# Patient Record
Sex: Female | Born: 1955 | Race: White | Hispanic: No | Marital: Married | Smoking: Former smoker
Health system: Southern US, Community
[De-identification: ages and names within clinical notes are randomized; demographics above are authoritative.]

## PROBLEM LIST (undated history)

## (undated) DIAGNOSIS — R1310 Dysphagia, unspecified: Secondary | ICD-10-CM

## (undated) DIAGNOSIS — E119 Type 2 diabetes mellitus without complications: Secondary | ICD-10-CM

## (undated) DIAGNOSIS — K219 Gastro-esophageal reflux disease without esophagitis: Secondary | ICD-10-CM

## (undated) DIAGNOSIS — I7090 Unspecified atherosclerosis: Secondary | ICD-10-CM

## (undated) DIAGNOSIS — R600 Localized edema: Secondary | ICD-10-CM

## (undated) DIAGNOSIS — E669 Obesity, unspecified: Secondary | ICD-10-CM

## (undated) DIAGNOSIS — I10 Essential (primary) hypertension: Secondary | ICD-10-CM

## (undated) DIAGNOSIS — R7303 Prediabetes: Secondary | ICD-10-CM

## (undated) DIAGNOSIS — J45909 Unspecified asthma, uncomplicated: Secondary | ICD-10-CM

## (undated) DIAGNOSIS — F419 Anxiety disorder, unspecified: Secondary | ICD-10-CM

## (undated) DIAGNOSIS — N289 Disorder of kidney and ureter, unspecified: Secondary | ICD-10-CM

## (undated) DIAGNOSIS — M549 Dorsalgia, unspecified: Secondary | ICD-10-CM

## (undated) DIAGNOSIS — J449 Chronic obstructive pulmonary disease, unspecified: Secondary | ICD-10-CM

## (undated) DIAGNOSIS — R079 Chest pain, unspecified: Secondary | ICD-10-CM

## (undated) DIAGNOSIS — K5900 Constipation, unspecified: Secondary | ICD-10-CM

## (undated) DIAGNOSIS — G4730 Sleep apnea, unspecified: Secondary | ICD-10-CM

## (undated) DIAGNOSIS — M2550 Pain in unspecified joint: Secondary | ICD-10-CM

## (undated) DIAGNOSIS — R0602 Shortness of breath: Secondary | ICD-10-CM

## (undated) DIAGNOSIS — N189 Chronic kidney disease, unspecified: Secondary | ICD-10-CM

## (undated) DIAGNOSIS — E785 Hyperlipidemia, unspecified: Secondary | ICD-10-CM

## (undated) DIAGNOSIS — I739 Peripheral vascular disease, unspecified: Secondary | ICD-10-CM

## (undated) HISTORY — DX: Chest pain, unspecified: R07.9

## (undated) HISTORY — DX: Dorsalgia, unspecified: M54.9

## (undated) HISTORY — DX: Disorder of kidney and ureter, unspecified: N28.9

## (undated) HISTORY — DX: Pain in unspecified joint: M25.50

## (undated) HISTORY — DX: Unspecified atherosclerosis: I70.90

## (undated) HISTORY — DX: Shortness of breath: R06.02

## (undated) HISTORY — PX: APPENDECTOMY: SHX54

## (undated) HISTORY — DX: Gastro-esophageal reflux disease without esophagitis: K21.9

## (undated) HISTORY — DX: Hyperlipidemia, unspecified: E78.5

## (undated) HISTORY — DX: Constipation, unspecified: K59.00

## (undated) HISTORY — DX: Obesity, unspecified: E66.9

## (undated) HISTORY — DX: Dysphagia, unspecified: R13.10

## (undated) HISTORY — DX: Localized edema: R60.0

---
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
## 2004-12-26 ENCOUNTER — Emergency Department (HOSPITAL_COMMUNITY): Admission: EM | Admit: 2004-12-26 | Discharge: 2004-12-26 | Payer: Self-pay | Admitting: Emergency Medicine

## 2008-03-29 ENCOUNTER — Other Ambulatory Visit: Payer: Self-pay

## 2008-03-29 ENCOUNTER — Emergency Department: Payer: Self-pay | Admitting: Emergency Medicine

## 2015-12-10 ENCOUNTER — Encounter (HOSPITAL_COMMUNITY): Payer: Self-pay | Admitting: Emergency Medicine

## 2015-12-10 ENCOUNTER — Emergency Department (HOSPITAL_COMMUNITY): Payer: Self-pay

## 2015-12-10 ENCOUNTER — Observation Stay (HOSPITAL_COMMUNITY)
Admission: EM | Admit: 2015-12-10 | Discharge: 2015-12-11 | Disposition: A | Discharge status: Home / Self Care | Payer: Self-pay | Attending: Internal Medicine | Admitting: Internal Medicine

## 2015-12-10 DIAGNOSIS — Z7982 Long term (current) use of aspirin: Secondary | ICD-10-CM | POA: Insufficient documentation

## 2015-12-10 DIAGNOSIS — M791 Myalgia: Secondary | ICD-10-CM | POA: Insufficient documentation

## 2015-12-10 DIAGNOSIS — J029 Acute pharyngitis, unspecified: Secondary | ICD-10-CM | POA: Insufficient documentation

## 2015-12-10 DIAGNOSIS — J4530 Mild persistent asthma, uncomplicated: Secondary | ICD-10-CM | POA: Diagnosis present

## 2015-12-10 DIAGNOSIS — J45909 Unspecified asthma, uncomplicated: Secondary | ICD-10-CM | POA: Insufficient documentation

## 2015-12-10 DIAGNOSIS — Z79899 Other long term (current) drug therapy: Secondary | ICD-10-CM | POA: Insufficient documentation

## 2015-12-10 DIAGNOSIS — R079 Chest pain, unspecified: Principal | ICD-10-CM | POA: Insufficient documentation

## 2015-12-10 DIAGNOSIS — H9209 Otalgia, unspecified ear: Secondary | ICD-10-CM | POA: Insufficient documentation

## 2015-12-10 DIAGNOSIS — E785 Hyperlipidemia, unspecified: Secondary | ICD-10-CM | POA: Insufficient documentation

## 2015-12-10 DIAGNOSIS — D72829 Elevated white blood cell count, unspecified: Secondary | ICD-10-CM | POA: Diagnosis present

## 2015-12-10 DIAGNOSIS — R7303 Prediabetes: Secondary | ICD-10-CM | POA: Diagnosis present

## 2015-12-10 DIAGNOSIS — I10 Essential (primary) hypertension: Secondary | ICD-10-CM | POA: Diagnosis present

## 2015-12-10 DIAGNOSIS — E119 Type 2 diabetes mellitus without complications: Secondary | ICD-10-CM | POA: Insufficient documentation

## 2015-12-10 DIAGNOSIS — J4540 Moderate persistent asthma, uncomplicated: Secondary | ICD-10-CM | POA: Diagnosis present

## 2015-12-10 DIAGNOSIS — D649 Anemia, unspecified: Secondary | ICD-10-CM | POA: Diagnosis present

## 2015-12-10 HISTORY — DX: Unspecified asthma, uncomplicated: J45.909

## 2015-12-10 HISTORY — DX: Essential (primary) hypertension: I10

## 2015-12-10 HISTORY — DX: Type 2 diabetes mellitus without complications: E11.9

## 2015-12-10 LAB — BASIC METABOLIC PANEL
ANION GAP: 13 (ref 5–15)
BUN: 7 mg/dL (ref 6–20)
CHLORIDE: 103 mmol/L (ref 101–111)
CO2: 23 mmol/L (ref 22–32)
Calcium: 9.1 mg/dL (ref 8.9–10.3)
Creatinine, Ser: 0.92 mg/dL (ref 0.44–1.00)
GFR calc Af Amer: >60 mL/min (ref >60)
GFR calc non Af Amer: >60 mL/min (ref >60)
GLUCOSE: 129 mg/dL — AB (ref 65–99)
POTASSIUM: 4 mmol/L (ref 3.5–5.1)
Sodium: 139 mmol/L (ref 135–145)

## 2015-12-10 LAB — CBC
HEMATOCRIT: 33.8 % — AB (ref 36.0–46.0)
HEMOGLOBIN: 10.7 g/dL — AB (ref 12.0–15.0)
MCH: 26 pg (ref 26.0–34.0)
MCHC: 31.7 g/dL (ref 30.0–36.0)
MCV: 82.2 fL (ref 78.0–100.0)
Platelets: 366 10*3/uL (ref 150–400)
RBC: 4.11 MIL/uL (ref 3.87–5.11)
RDW: 14.2 % (ref 11.5–15.5)
WBC: 12.7 10*3/uL — ABNORMAL HIGH (ref 4.0–10.5)

## 2015-12-10 LAB — I-STAT TROPONIN, ED: Troponin i, poc: 0.01 ng/mL (ref 0.00–0.08)

## 2015-12-10 MED ORDER — ACETAMINOPHEN 500 MG PO TABS
1000.0000 mg | ORAL_TABLET | Freq: Once | ORAL | Status: AC
  Administered 2015-12-10: 1000 mg via ORAL
  Filled 2015-12-10: qty 2

## 2015-12-10 MED ORDER — ASPIRIN 81 MG PO CHEW
324.0000 mg | CHEWABLE_TABLET | Freq: Once | ORAL | Status: AC
  Administered 2015-12-10: 324 mg via ORAL
  Filled 2015-12-10: qty 4

## 2015-12-10 NOTE — ED Notes (Addendum)
Pt states for the last weeks she has had intermittent central chest pain and today pain was worse and under both breast. Pt states the pain is a squeezing sensation. Pt also reports sob with exertion and when laying down. Pt sent from PCP. States she received at [facility] nitro at the MDs office which relieved her pain and now she has some tightness in her chest.

## 2015-12-10 NOTE — ED Provider Notes (Signed)
CSN: [id number]     Arrival date & time 12/10/15  1355 History  By signing my name below, I, Kelli Horn, attest that this documentation has been prepared under the direction and in the presence of Malvin Johns, MD. Electronically Signed: Altamease Horn, ED Scribe. 12/11/2015. 12:09 AM   Chief Complaint  Patient presents with  . Chest Pain   The history is provided by the patient. No language interpreter was used.   Kelli Horn is a 60 y.o. female with history of asthma, HTN, and DM who presents to the Emergency Department complaining of intermittent, crushing/tight, bilateral chest pain with onset 3-4 weeks ago. She is pain free at present. Pt states that she is working with her PCP, Dr. Welton Flakes, on her poorly controlled blood pressure. Doubling her lisinopril-hydrochlorothiazide caused unwanted side effects and was recently discontinued. 3-4 weeks ago she started losartan and has been having chest pains since. The losartan was discontinued and she was advised to double her hydrochlorothiazide. Doubling the fluid pill caused flank pain so she was started on clonidine 8 days ago and the HCTZ was discontinued. The chest pains have worsened in the last week. Today she was seen by her PCP, where NTG provided some relief in chest pain, and she was referred to the ED. Associated symptoms include low grade fever, slight dry cough, SOB that is worse with exertion and  laying flat and not improved by her inhaler, abdominal pain, and generalized myalgias. She also complains of headache,  left sided throat pain, and ear pain. Pt denies LE swelling and dark or bloody stools. She states that her blood sugar  Is controlled with diet. No history of high cholesterol. The pt does not smoke. Her father died of an MI at 55. She took an 81 mg aspirin today.   Past Medical History  Diagnosis Date  . Hypertension   . Diabetes mellitus without complication ([place])   . Asthma    Past Surgical History  Procedure  Laterality Date  . Appendectomy     No family history on file. Social History  Substance Use Topics  . Smoking status: Never Smoker   . Smokeless tobacco: None  . Alcohol Use: No   OB History    No data available     Review of Systems  Constitutional: Positive for fever.  HENT: Positive for ear pain and sore throat.   Respiratory: Positive for cough and shortness of breath.   Cardiovascular: Positive for chest pain. Negative for leg swelling.  Gastrointestinal: Positive for abdominal pain. Negative for blood in stool.  Musculoskeletal: Positive for myalgias.  Neurological: Positive for headaches.  All other systems reviewed and are negative.   Allergies  Review of patient's allergies indicates no known allergies.  Home Medications   Prior to Admission medications   Medication Sig Start Date End Date Taking? Authorizing Provider  albuterol (PROVENTIL HFA;VENTOLIN HFA) 108 (90 Base) MCG/ACT inhaler Inhale 2 puffs into the lungs every 6 (six) hours as needed for wheezing or shortness of breath.   Yes Historical Provider, MD  aspirin EC 81 MG tablet Take 81 mg by mouth daily.   Yes Historical Provider, MD  cloNIDine (CATAPRES) 0.2 MG tablet Take 0.2 mg by mouth 2 (two) times daily.   Yes Historical Provider, MD  ibuprofen (ADVIL,MOTRIN) 200 MG tablet Take 400 mg by mouth every 6 (six) hours as needed for headache.   Yes Historical Provider, MD  loratadine (CLARITIN) 10 MG tablet Take 10 mg by mouth  daily.   Yes Historical Provider, MD  methocarbamol (ROBAXIN) 500 MG tablet Take 500 mg by mouth every 6 (six) hours as needed for muscle spasms.   Yes Historical Provider, MD   BP 154/67 mmHg  Pulse 80  Temp(Src) 98.2 F (36.8 C) (Oral)  Resp 20  Ht 5\' 10"  (1.778 m)  Wt 312 lb (141.522 kg)  BMI 44.77 kg/m2  SpO2 98%  LMP 12/09/2013 Physical Exam  Constitutional: She is oriented to person, place, and time. She appears well-developed and well-nourished. No distress.  HENT:   Head: Normocephalic and atraumatic.  Eyes: EOM are normal.  Neck: Normal range of motion.  Cardiovascular: Normal rate, regular rhythm and normal heart sounds.   Pulmonary/Chest: Effort normal and breath sounds normal.  Abdominal: Soft. She exhibits no distension. There is no tenderness.  Musculoskeletal: Normal range of motion. She exhibits no edema or tenderness.  No LE edema or calf tenderness   Neurological: She is alert and oriented to person, place, and time.  Skin: Skin is warm and dry.  Psychiatric: She has a normal mood and affect. Judgment normal.  Nursing note and vitals reviewed.   ED Course  Procedures (including critical care time) DIAGNOSTIC STUDIES: Oxygen Saturation is 98% on RA,  normal by my interpretation.    COORDINATION OF CARE: 11:13 PM Discussed treatment plan which includes lab work, CXR, EKG, and admission to the hospital with pt at bedside and pt agreed to plan.  12:05 AM-Consult complete with Dr. Loleta Books (Hospitalist). Patient case explained and discussed. Call ended at 12:08 AM  Labs Review Labs Reviewed  BASIC METABOLIC PANEL - Abnormal; Notable for the following:    Glucose, Bld 129 (*)    All other components within normal limits  CBC - Abnormal; Notable for the following:    WBC 12.7 (*)    Hemoglobin 10.7 (*)    HCT 33.8 (*)    All other components within normal limits  BRAIN NATRIURETIC PEPTIDE  I-STAT TROPOININ, ED  Randolm Idol, ED    Imaging Review Dg Chest 2 View  12/10/2015  CLINICAL DATA:  Chest pain. Squeezing. Shortness of breath with exertion. EXAM: CHEST  2 VIEW COMPARISON:  None. FINDINGS: Midline trachea. Mild cardiomegaly. Atherosclerosis in the transverse aorta. Exam degraded by patient body habitus. No definite pleural fluid. No congestive failure. No lobar consolidation. No pneumothorax. IMPRESSION: Mild cardiomegaly, without congestive failure. Electronically Signed   By: Abigail Miyamoto M.D.   On: 12/10/2015 14:56   I  have personally reviewed and evaluated these images and lab results as part of my medical decision-making.   EKG Interpretation   Date/Time:  Tuesday December 10 2015 14:00:33 EDT Ventricular Rate:  88 PR Interval:  140 QRS Duration: 76 QT Interval:  356 QTC Calculation: 430 R Axis:   67 Text Interpretation:  Normal sinus rhythm Normal ECG since last tracing no  significant change Confirmed by Dhiya Smits  MD, Beyonce Sawatzky ([id number]) on 12/10/2015  11:21:39 PM      MDM   Final diagnoses:  Chest pain, unspecified chest pain type    Patient presents with intermittent chest pain over the last 2-3 weeks. Symptoms worse today when she went to her PCP. The symptoms are worse with exertion. She also has worsening shortness of breath when lying flat. She doesn't have suggestions of fluid overload. She doesn't have symptoms that would be more consistent with pulmonary embolus or dissection. There is no evidence of pneumonia. I will add a BNP onto her  labs. Her EKG doesn't show any acute ischemia. Her troponin is negative. However given her exertional symptoms along with her risk factors for heart disease, I feel that she needs to be admitted for further cardiac evaluation. I will consult unassigned medicine.  I spoke with Dr. Loleta Books who will admit the pt.  I personally performed the services described in this documentation, which was scribed in my presence.  The recorded information has been reviewed and considered.    Malvin Johns, MD 12/11/15 [id number]

## 2015-12-11 ENCOUNTER — Other Ambulatory Visit (HOSPITAL_COMMUNITY): Payer: Self-pay

## 2015-12-11 ENCOUNTER — Observation Stay (HOSPITAL_BASED_OUTPATIENT_CLINIC_OR_DEPARTMENT_OTHER): Payer: Self-pay

## 2015-12-11 ENCOUNTER — Encounter (HOSPITAL_COMMUNITY): Payer: Self-pay | Admitting: Family Medicine

## 2015-12-11 ENCOUNTER — Ambulatory Visit (HOSPITAL_COMMUNITY): Payer: Self-pay

## 2015-12-11 ENCOUNTER — Observation Stay (HOSPITAL_COMMUNITY): Payer: MEDICAID

## 2015-12-11 ENCOUNTER — Other Ambulatory Visit: Payer: Self-pay | Admitting: Cardiology

## 2015-12-11 DIAGNOSIS — R079 Chest pain, unspecified: Secondary | ICD-10-CM | POA: Insufficient documentation

## 2015-12-11 DIAGNOSIS — D649 Anemia, unspecified: Secondary | ICD-10-CM | POA: Diagnosis present

## 2015-12-11 DIAGNOSIS — I10 Essential (primary) hypertension: Secondary | ICD-10-CM | POA: Diagnosis present

## 2015-12-11 DIAGNOSIS — E785 Hyperlipidemia, unspecified: Secondary | ICD-10-CM | POA: Insufficient documentation

## 2015-12-11 DIAGNOSIS — D72829 Elevated white blood cell count, unspecified: Secondary | ICD-10-CM | POA: Diagnosis present

## 2015-12-11 DIAGNOSIS — I200 Unstable angina: Secondary | ICD-10-CM | POA: Insufficient documentation

## 2015-12-11 DIAGNOSIS — J45909 Unspecified asthma, uncomplicated: Secondary | ICD-10-CM | POA: Diagnosis present

## 2015-12-11 DIAGNOSIS — J4540 Moderate persistent asthma, uncomplicated: Secondary | ICD-10-CM | POA: Diagnosis present

## 2015-12-11 DIAGNOSIS — R072 Precordial pain: Secondary | ICD-10-CM

## 2015-12-11 DIAGNOSIS — R7303 Prediabetes: Secondary | ICD-10-CM | POA: Diagnosis present

## 2015-12-11 DIAGNOSIS — J4530 Mild persistent asthma, uncomplicated: Secondary | ICD-10-CM | POA: Diagnosis present

## 2015-12-11 LAB — GLUCOSE, CAPILLARY
GLUCOSE-CAPILLARY: 90 mg/dL (ref 65–99)
Glucose-Capillary: 158 mg/dL — ABNORMAL HIGH (ref 65–99)
Glucose-Capillary: 117 mg/dL — ABNORMAL HIGH (ref 65–99)

## 2015-12-11 LAB — I-STAT TROPONIN, ED: TROPONIN I, POC: 0.01 ng/mL (ref 0.00–0.08)

## 2015-12-11 LAB — LIPID PANEL
Cholesterol: 207 mg/dL — ABNORMAL HIGH (ref 0–200)
HDL: 34 mg/dL — ABNORMAL LOW (ref >40)
LDL CALC: 153 mg/dL — AB (ref 0–99)
TRIGLYCERIDES: 102 mg/dL (ref <150)
Total CHOL/HDL Ratio: 6.1 RATIO
VLDL: 20 mg/dL (ref 0–40)

## 2015-12-11 LAB — TROPONIN I
Troponin I: <0.03 ng/mL (ref <0.031)
Troponin I: <0.03 ng/mL (ref <0.031)

## 2015-12-11 LAB — ECHOCARDIOGRAM COMPLETE
HEIGHTINCHES: 70 in
WEIGHTICAEL: 5012.8 [oz_av]

## 2015-12-11 LAB — BRAIN NATRIURETIC PEPTIDE: B NATRIURETIC PEPTIDE 5: 260 pg/mL — AB (ref 0.0–100.0)

## 2015-12-11 MED ORDER — NITROGLYCERIN 0.4 MG SL SUBL: 0.4000 mg | SUBLINGUAL_TABLET | SUBLINGUAL | Status: DC | PRN

## 2015-12-11 MED ORDER — ATORVASTATIN CALCIUM 40 MG PO TABS: 40.0000 mg | ORAL_TABLET | Freq: Every day | ORAL | Status: DC

## 2015-12-11 MED ORDER — METHOCARBAMOL 500 MG PO TABS: 500.0000 mg | ORAL_TABLET | Freq: Four times a day (QID) | ORAL | Status: DC | PRN

## 2015-12-11 MED ORDER — CLONIDINE HCL 0.2 MG PO TABS
0.2000 mg | ORAL_TABLET | Freq: Two times a day (BID) | ORAL | Status: DC
  Administered 2015-12-11: 0.2 mg via ORAL
  Filled 2015-12-11: qty 1

## 2015-12-11 MED ORDER — ASPIRIN EC 81 MG PO TBEC
81.0000 mg | DELAYED_RELEASE_TABLET | Freq: Every day | ORAL | Status: DC
  Administered 2015-12-11: 81 mg via ORAL
  Filled 2015-12-11: qty 1

## 2015-12-11 MED ORDER — ACETAMINOPHEN 325 MG PO TABS: 650.0000 mg | ORAL_TABLET | ORAL | Status: DC | PRN

## 2015-12-11 MED ORDER — ENOXAPARIN SODIUM 40 MG/0.4ML ~~LOC~~ SOLN
40.0000 mg | SUBCUTANEOUS | Status: DC
  Filled 2015-12-11: qty 0.4

## 2015-12-11 MED ORDER — ALBUTEROL SULFATE (2.5 MG/3ML) 0.083% IN NEBU: 3.0000 mL | INHALATION_SOLUTION | Freq: Four times a day (QID) | RESPIRATORY_TRACT | Status: DC | PRN

## 2015-12-11 MED ORDER — HEPARIN (PORCINE) IN NACL 100-0.45 UNIT/ML-% IJ SOLN
1400.0000 [IU]/h | INTRAMUSCULAR | Status: DC
  Administered 2015-12-11: 1400 [IU]/h via INTRAVENOUS
  Filled 2015-12-11: qty 250

## 2015-12-11 MED ORDER — ONDANSETRON HCL 4 MG/2ML IJ SOLN: 4.0000 mg | Freq: Four times a day (QID) | INTRAMUSCULAR | Status: DC | PRN

## 2015-12-11 MED ORDER — LORATADINE 10 MG PO TABS
10.0000 mg | ORAL_TABLET | Freq: Every day | ORAL | Status: DC
  Administered 2015-12-11: 10 mg via ORAL
  Filled 2015-12-11: qty 1

## 2015-12-11 MED ORDER — GI COCKTAIL ~~LOC~~: 30.0000 mL | Freq: Four times a day (QID) | ORAL | Status: DC | PRN

## 2015-12-11 MED ORDER — HEPARIN BOLUS VIA INFUSION
4000.0000 [IU] | Freq: Once | INTRAVENOUS | Status: AC
  Administered 2015-12-11: 4000 [IU] via INTRAVENOUS
  Filled 2015-12-11: qty 4000

## 2015-12-11 MED ORDER — ATORVASTATIN CALCIUM 40 MG PO TABS
40.0000 mg | ORAL_TABLET | Freq: Every day | ORAL | Status: DC
  Administered 2015-12-11: 40 mg via ORAL
  Filled 2015-12-11: qty 1

## 2015-12-11 NOTE — Plan of Care (Signed)
Problem: Consults Goal: Chest Pain Patient Education (See Patient Education module for education specifics.) Outcome: Progressing Patient visited her PCP earlier today with a 5-week, on/off history of chest pain, dyspnea on exertion and orthopnea. Symptoms at the MD's office were relieved with one sublingual nitroglycerin and patient was referred to [facility] for evaluation. She has remained symptom-free since arrival with some degree of hypertension that does not appear to be escalating.    Filed Vitals:    12/10/15 2047 12/10/15 2315 12/10/15 2345 12/11/15 0100  BP: 154/67 176/75 162/68 156/93  Pulse: 80 86 80 82  Temp: 98.2 F (36.8 C)     97.7 F (36.5 C)  TempSrc: Oral     Oral  Resp: 20 14 12     Height:       5\' 10"  (1.778 m)  Weight:       142.112 kg (313 lb 4.8 oz)  SpO2: 98% 97% 99% 98%    Admission history obtained and patient was educated on arrival to unit re:  Plan of care (chest pain observation)  Potential tests/labs/procedures:  Serial troponin I  Serial EKG  2-D Echocardiogram  Cardiac stress myoview  Cardiac catheterization  Unit routines:  Pain scale/management  Use of call light/telephone for assistance  Need to call RN for any symptoms  Vital signs  Safety  Activity progression  PPSV vaccine information statement (declined by patient)  NPO after midnight  Initial discharge planning  Patient has no concerns or active symptoms at this time. MD has arrived to floor to interview patient.  Continuing to monitor closely.  Problem: Phase I Progression Outcomes Goal: Other Phase I Outcomes/Goals Outcome: Progressing Question new onset heart failure based on admission labs and history provided by patient.

## 2015-12-11 NOTE — Progress Notes (Signed)
  Echocardiogram 2D Echocardiogram has been performed.  Kelli Horn 12/11/2015, 11:44 AM

## 2015-12-11 NOTE — Consult Note (Signed)
Referring Physician: Loleta Books Primary Physician: Primary Cardiologist: Reason for Consultation: chest pain   HPI: Kelli Horn is a 60 y.o. female with a past medical history significant for HTN poorly controlled, former smoker, asthma who presents with chest pain.  She reports it started a few weeks ago and though it was related to her losartan.  Initially reports pain was burning, right of center chest.  Later on this developed more of a tightness sensation.  No aggravating factors, outside of believing it was related to her medication.  Has had some issues with her left arm and was thought to be secondary to a pinched nerve, prescribed prednisone but stopped because of elevated BS.  These episodes of pain have lasted hours at a time.  Today she reports it was worse and her SOB didn't improve with albuterol.  She was given NTG at PCP's office with resolution of symptoms.  She is a former smoker, quit a few years ago, smoked for 15 years. Father with MI, deceased from it around age 65.  In the ED, the patient's initial ECG showed no ischemic changes and troponin was negative. TRH was asked to admit for observation who then requested cardiology consultation for concerns for unstable angina.       Review of Systems:     Cardiac Review of Systems: {Y] = yes [ ]  = no  Chest Pain [    ]  Resting SOB [   ] Exertional SOB  [  ]  Orthopnea [  ]   Pedal Edema [   ]    Palpitations [  ] Syncope  [  ]   Presyncope [   ]  General Review of Systems: [Y] = yes [  ]=no Constitional: recent weight change [  ]; anorexia [  ]; fatigue [  ]; nausea [  ]; night sweats [  ]; fever [  ]; or chills [  ];                                                                      Eyes : blurred vision [  ]; diplopia [   ]; vision changes [  ];  Amaurosis fugax[  ]; Resp: cough [  ];  wheezing[  ];  hemoptysis[  ];  PND [  ];  GI:  gallstones[  ], vomiting[  ];  dysphagia[  ]; melena[  ];  hematochezia [  ];  heartburn[  ];   GU: kidney stones [  ]; hematuria[  ];   dysuria [  ];  nocturia[  ]; incontinence [  ];             Skin: rash, swelling[  ];, hair loss[  ];  peripheral edema[  ];  or itching[  ]; Musculosketetal: myalgias[  ];  joint swelling[  ];  joint erythema[  ];  joint pain[  ];  back pain[  ];  Heme/Lymph: bruising[  ];  bleeding[  ];  anemia[  ];  Neuro: TIA[  ];  headaches[  ];  stroke[  ];  vertigo[  ];  seizures[  ];   paresthesias[  ];  difficulty walking[  ];  Psych:depression[  ]; anxiety[  ];  Endocrine: diabetes[  ];  thyroid dysfunction[  ];  Other:  Past Medical History  Diagnosis Date  . Hypertension   . Diabetes mellitus without complication ([place])   . Asthma     Medications Prior to Admission  Medication Sig Dispense Refill  . albuterol (PROVENTIL HFA;VENTOLIN HFA) 108 (90 Base) MCG/ACT inhaler Inhale 2 puffs into the lungs every 6 (six) hours as needed for wheezing or shortness of breath.    Marland Kitchen aspirin EC 81 MG tablet Take 81 mg by mouth daily.    . cloNIDine (CATAPRES) 0.2 MG tablet Take 0.2 mg by mouth 2 (two) times daily.    Marland Kitchen ibuprofen (ADVIL,MOTRIN) 200 MG tablet Take 400 mg by mouth every 6 (six) hours as needed for headache.    . loratadine (CLARITIN) 10 MG tablet Take 10 mg by mouth daily.    . methocarbamol (ROBAXIN) 500 MG tablet Take 500 mg by mouth every 6 (six) hours as needed for muscle spasms.       Marland Kitchen aspirin EC  81 mg Oral Daily  . cloNIDine  0.2 mg Oral BID  . loratadine  10 mg Oral Daily    Infusions: . heparin 1,400 Units/hr (12/11/15 0405)    No Known Allergies  Social History   Social History  . Marital Status: Married    Spouse Name: N/A  . Number of Children: N/A  . Years of Education: N/A   Occupational History  . Not on file.   Social History Main Topics  . Smoking status: Never Smoker   . Smokeless tobacco: Not on file  . Alcohol Use: No  . Drug Use: No  . Sexual Activity: Not on file   Other Topics Concern    . Not on file   Social History Narrative    Family History  Problem Relation Age of Onset  . Heart attack Father 83  . Breast cancer Maternal Aunt   . Lung cancer Mother   . Diabetes      PHYSICAL EXAM: Filed Vitals:   12/11/15 0100 12/11/15 0332  BP: 156/93 162/60  Pulse: 82 78  Temp: 97.7 F (36.5 C) 97.9 F (36.6 C)  Resp:  18    No intake or output data in the 24 hours ending 12/11/15 0440  General:  Well appearing. No respiratory difficulty.  Obese HEENT: [place], AT, PERRL EOMI anicteric Neck: supple. no JVD. Carotids 2+ bilat; no bruits. No lymphadenopathy or thryomegaly appreciated. Cor: PMI nondisplaced. Regular rate & rhythm. No rubs, gallops.  + systolic murmur. Lungs: clear Abdomen: soft, nontender, nondistended. No hepatosplenomegaly. No bruits or masses. Good bowel sounds. Extremities: no cyanosis, clubbing, rash, edema Neuro: alert & oriented x 3, cranial nerves grossly intact. moves all 4 extremities w/o difficulty. Affect pleasant.  ECG: SR, normal  Results for orders placed or performed during the hospital encounter of 12/10/15 (from the past 24 hour(s))  Basic metabolic panel     Status: Abnormal   Collection Time: 12/10/15  2:11 PM  Result Value Ref Range   Sodium 139 135 - 145 mmol/L   Potassium 4.0 3.5 - 5.1 mmol/L   Chloride 103 101 - 111 mmol/L   CO2 23 22 - 32 mmol/L   Glucose, Bld 129 (H) 65 - 99 mg/dL   BUN 7 6 - 20 mg/dL   Creatinine, Ser 0.92 0.44 - 1.00 mg/dL   Calcium 9.1 8.9 - 10.3 mg/dL   GFR calc non Af Amer >60 >60 mL/min   GFR  calc Af Amer >60 >60 mL/min   Anion gap 13 5 - 15  CBC     Status: Abnormal   Collection Time: 12/10/15  2:11 PM  Result Value Ref Range   WBC 12.7 (H) 4.0 - 10.5 K/uL   RBC 4.11 3.87 - 5.11 MIL/uL   Hemoglobin 10.7 (L) 12.0 - 15.0 g/dL   HCT 33.8 (L) 36.0 - 46.0 %   MCV 82.2 78.0 - 100.0 fL   MCH 26.0 26.0 - 34.0 pg   MCHC 31.7 30.0 - 36.0 g/dL   RDW 14.2 11.5 - 15.5 %   Platelets 366 150 - 400  K/uL  I-stat troponin, ED     Status: None   Collection Time: 12/10/15  2:33 PM  Result Value Ref Range   Troponin i, poc 0.01 0.00 - 0.08 ng/mL   Comment 3          Brain natriuretic peptide     Status: Abnormal   Collection Time: 12/11/15 12:23 AM  Result Value Ref Range   B Natriuretic Peptide 260.0 (H) 0.0 - 100.0 pg/mL  I-Stat Troponin, ED (not at [facility])     Status: None   Collection Time: 12/11/15 12:42 AM  Result Value Ref Range   Troponin i, poc 0.01 0.00 - 0.08 ng/mL   Comment 3          Lipid panel     Status: Abnormal   Collection Time: 12/11/15  2:46 AM  Result Value Ref Range   Cholesterol 207 (H) 0 - 200 mg/dL   Triglycerides 102 <150 mg/dL   HDL 34 (L) >40 mg/dL   Total CHOL/HDL Ratio 6.1 RATIO   VLDL 20 0 - 40 mg/dL   LDL Cholesterol 153 (H) 0 - 99 mg/dL  Troponin I-serum (0, 3, 6 hours)     Status: None   Collection Time: 12/11/15  2:46 AM  Result Value Ref Range   Troponin I <0.03 <0.031 ng/mL   Dg Chest 2 View  12/10/2015  CLINICAL DATA:  Chest pain. Squeezing. Shortness of breath with exertion. EXAM: CHEST  2 VIEW COMPARISON:  None. FINDINGS: Midline trachea. Mild cardiomegaly. Atherosclerosis in the transverse aorta. Exam degraded by patient body habitus. No definite pleural fluid. No congestive failure. No lobar consolidation. No pneumothorax. IMPRESSION: Mild cardiomegaly, without congestive failure. Electronically Signed   By: Abigail Miyamoto M.D.   On: 12/10/2015 14:56     ASSESSMENT: 60 yo woman with HTN, prediabetes and FHx who presents with chest pain.  Her history has both typical and atypical features.  However, she describes rather prolonged episodes of chest discomfort and ECG is without ischemic changes and troponin negative which makes ischemia slightly less likely.    PLAN/DISCUSSION: Cycle trops Do not feel anticoagulation is necessary given history obtained unless troponin becomes positive Aspirin daily Risk stratify with A1c and lipids Echo  in AM given systolic murmur Further recommendations pending above

## 2015-12-11 NOTE — Care Management Note (Signed)
Case Management Note  Patient Details  Name: Kelli Horn MRN: [id number] Date of Birth: Mar 23, 1956  Subjective/Objective:     Chest pain              Action/Plan: Discharge Planning: AVS reviewed:   Spoke to pt and she plans to follow up with her PCP Dr Renold Don. No assistance requested for medications.    Expected Discharge Date:  12/11/2015               Expected Discharge Plan:  Home/Self Care  In-House Referral:  NA  Discharge planning Services  CM Consult  Post Acute Care Choice:  NA Choice offered to:  NA  DME Arranged:  N/A DME Agency:  NA  HH Arranged:  NA HH Agency:  NA  Status of Service:  Completed, signed off  Medicare Important Message Given:    Date Medicare IM Given:    Medicare IM give by:    Date Additional Medicare IM Given:    Additional Medicare Important Message give by:     If discussed at [place] of Stay Meetings, dates discussed:    Additional Comments:  Erenest Rasher, RN 12/11/2015, 5:26 PM

## 2015-12-11 NOTE — Progress Notes (Signed)
Patient Profile: 60 yo moderately obese female with HTN, prediabetes, remote tobacco use and FHx (father with fatal MI in his 35s) who presents with chest pain. Her history has both typical and atypical features.   Subjective: Still with intermitted chest tightness that is pleuritic.   Objective: Vital signs in last 24 hours: Temp:  [97.7 F (36.5 C)-98.6 F (37 C)] 98.1 F (36.7 C) (04/12 0808) Pulse Rate:  [78-88] 81 (04/12 0808) Resp:  [12-20] 20 (04/12 0808) BP: (135-176)/(60-93) 159/83 mmHg (04/12 0808) SpO2:  [93 %-99 %] 97 % (04/12 0808) Weight:  [312 lb (141.522 kg)-313 lb 4.8 oz (142.112 kg)] 313 lb 4.8 oz (142.112 kg) (04/12 0100) Last BM Date: 12/10/15  Intake/Output from previous day: 04/11 0701 - 04/12 0700 In: 64 [I.V.:64] Out: -  Intake/Output this shift: Total I/O In: 360 [P.O.:360] Out: -   Medications Current Facility-Administered Medications  Medication Dose Route Frequency Provider Last Rate Last Dose  . acetaminophen (TYLENOL) tablet 650 mg  650 mg Oral Q4H PRN Edwin Dada, MD      . albuterol (PROVENTIL) (2.5 MG/3ML) 0.083% nebulizer solution 3 mL  3 mL Inhalation Q6H PRN Edwin Dada, MD      . aspirin EC tablet 81 mg  81 mg Oral Daily Edwin Dada, MD   81 mg at 12/11/15 0814  . cloNIDine (CATAPRES) tablet 0.2 mg  0.2 mg Oral BID Edwin Dada, MD   0.2 mg at 12/11/15 0814  . enoxaparin (LOVENOX) injection 40 mg  40 mg Subcutaneous Q24H Edwin Dada, MD   40 mg at 12/11/15 0814  . gi cocktail (Maalox,Lidocaine,Donnatal)  30 mL Oral QID PRN Edwin Dada, MD      . loratadine (CLARITIN) tablet 10 mg  10 mg Oral Daily Edwin Dada, MD   10 mg at 12/11/15 0815  . methocarbamol (ROBAXIN) tablet 500 mg  500 mg Oral Q6H PRN Edwin Dada, MD      . nitroGLYCERIN (NITROSTAT) SL tablet 0.4 mg  0.4 mg Sublingual Q5 min PRN Edwin Dada, MD      . ondansetron (ZOFRAN) injection  4 mg  4 mg Intravenous Q6H PRN Edwin Dada, MD        PE: General appearance: alert, cooperative, no distress and moderately obese Neck: no carotid bruit and no JVD Lungs: clear to auscultation bilaterally Heart: regular rate and rhythm, S1, S2 normal, no murmur, click, rub or gallop Extremities: no LEe Pulses: 2+ and symmetric Skin: warm and dry Neurologic: Grossly normal  Lab Results:   Recent Labs  12/10/15 1411  WBC 12.7*  HGB 10.7*  HCT 33.8*  PLT 366   BMET  Recent Labs  12/10/15 1411  NA 139  K 4.0  CL 103  CO2 23  GLUCOSE 129*  BUN 7  CREATININE 0.92  CALCIUM 9.1   PT/INR No results for input(s): LABPROT, INR in the last 72 hours. Cholesterol  Recent Labs  12/11/15 0246  CHOL 207*   Cardiac Panel (last 3 results)  Recent Labs  12/11/15 0246 12/11/15 0623  TROPONINI <0.03 <0.03    Studies/Results: 2D echo pending   Assessment/Plan   Principal Problem:   Chest pain Active Problems:   Hypertension   Asthma   Normocytic anemia   Leukocytosis   Prediabetes   1. Chest Pain: Mixed typical and atypical features. Patient notes intermitted chest tightness that is exacerbated by deep breathing. She has had mild  exertional symptoms and has had significant relief with SL NTG. However cardiac enzymes are negative x 3. EKG w/o ischemic abnormalities. Given her multiple risk factors, including HTN, prior tobacco use, family h/o CAD/SCD in a first degree relative and prediabetes, recommend NST to risk stratify. Unfortunately, she did eat a full breakfast this am, thus cannot do nuclear study today. Continue with plans for 2D echo today. Will make NPO at midnight and will plan for Lexiscan NST in the am. Given weight of 313 lb, she may require a 2 day study. Also given pleuritic CP, consider d-dimer to r/o PE.   2. HLD: FLP shows LDL level of 153. Given risk factors for heart disease, recommend initiation of statin therapy + lifestyle  modification through diet and exercise.   3. Prediabetes: Hgb A1c pending. IM will follow and will manage.   Gari Hartsell M. Ladoris Gene 12/11/2015 9:47 AM

## 2015-12-11 NOTE — Hospital Discharge Follow-Up (Deleted)
History and Physical  Patient Name: Kelli Horn     [id number]    DOB: 1956-01-31    DOA: 12/10/2015 Referring physician: Malvin Johns, MD PCP: No primary care provider on file.      Chief Complaint: Chest pain  HPI: Kelli Horn is a 60 y.o. female with a past medical history significant for HTN poorly controlled, former smoker, asthma who presents with chest pain.  The pain started about two weeks ago and was initially exertional, mild tightness, relieved with rest.  Would come on if she walked from her house out to her son's trailer and back, then go away.  Over last week, it has become more frequent, longer lasting, and provoked by less exertion.  It is now a "vice-like" chest tightness, that takes her breath away and is not improved with albuterol.  Tonight it was relieved well with nitroglycerin by EMS.    In the ED, the patient's initial ECG showed no ischemic changes and troponin was negative.  TRH was asked to admit for observation, serial troponins and risk stratification.    The patient is concerned that her new BP medication losartan could be causing this (was switched from lisinopril because of dizziness/malaise at higher dose).  Also has been taking prednisone for neck pain and one dose oral albuterol for asthma this past few weeks.  She is a former smoker, quit a few years ago, smoked for 15 years.  Father with MI, deceased from it around age 50.  Has pre-diabetes.  Has uncontrolled HTN.     Review of Systems:  All other systems negative except as just noted or noted in the history of present illness.  No Known Allergies  Prior to Admission medications   Medication Sig Start Date End Date Taking? Authorizing Provider  albuterol (PROVENTIL HFA;VENTOLIN HFA) 108 (90 Base) MCG/ACT inhaler Inhale 2 puffs into the lungs every 6 (six) hours as needed for wheezing or shortness of breath.   Yes Historical Provider, MD  aspirin EC 81 MG tablet Take 81 mg by mouth daily.    Yes Historical Provider, MD  cloNIDine (CATAPRES) 0.2 MG tablet Take 0.2 mg by mouth 2 (two) times daily.   Yes Historical Provider, MD  ibuprofen (ADVIL,MOTRIN) 200 MG tablet Take 400 mg by mouth every 6 (six) hours as needed for headache.   Yes Historical Provider, MD  loratadine (CLARITIN) 10 MG tablet Take 10 mg by mouth daily.   Yes Historical Provider, MD  methocarbamol (ROBAXIN) 500 MG tablet Take 500 mg by mouth every 6 (six) hours as needed for muscle spasms.   Yes Historical Provider, MD    Past Medical History  Diagnosis Date  . Hypertension   . Diabetes mellitus without complication ([place])   . Asthma     Past Surgical History  Procedure Laterality Date  . Appendectomy      Family history: family history includes Breast cancer in her maternal aunt; Heart attack (age of onset: 32) in her father; Lung cancer in her mother.  Social History: Patient lives next to her son.  She is a former smoker, quit several years ago, smoked for 15 years.  No alcohol.  Does not currently work. Ambulates without cane, participates in all ADLs.       Physical Exam: BP 156/93 mmHg  Pulse 82  Temp(Src) 97.7 F (36.5 C) (Oral)  Resp 12  Ht 5\' 10"  (1.778 m)  Wt 142.112 kg (313 lb 4.8 oz)  BMI 44.95 kg/m2  SpO2  98%  LMP 12/09/2013 General appearance: Well-developed, adult female, alert and in no acute distress.   Eyes: Anicteric, conjunctiva pink, lids and lashes normal.     ENT: No nasal deformity, discharge, or epistaxis.  OP moist without lesions.  Poor dentition. Skin: Warm and dry.   Cardiac: RRR, nl S1-S2, 2/6 SEM, patient states this is old.  Capillary refill is brisk.  JVP not visible.  No LE edema.  Radial pulses 2+ and symmetric.  No carotid bruits. Respiratory: Normal respiratory rate and rhythm.  CTAB without rales or wheezes. Abdomen: Abdomen soft without rigidity.  No TTP. No ascites, distension.   MSK: No deformities or effusions.   Pain not reproduced with palpation of  precordium or arm movement. Neuro: Sensorium intact and responding to questions, attention normal.  Speech is fluent.  Moves all extremities equally and with normal coordination.    Psych: Behavior appropriate.  Affect normal.  No evidence of aural or visual hallucinations or delusions.       Labs on Admission:  The metabolic panel shows normal electrolytes and renal function. The complete blood count shows mild leukocytosis, normocytic anemia. The initial troponin is negative.  Radiological Exams on Admission: Personally reviewed: Dg Chest 2 View  12/10/2015  CLINICAL DATA:  Chest pain. Squeezing. Shortness of breath with exertion. EXAM: CHEST  2 VIEW COMPARISON:  None. FINDINGS: Midline trachea. Mild cardiomegaly. Atherosclerosis in the transverse aorta. Exam degraded by patient body habitus. No definite pleural fluid. No congestive failure. No lobar consolidation. No pneumothorax. IMPRESSION: Mild cardiomegaly, without congestive failure. Electronically Signed   By: Abigail Miyamoto M.D.   On: 12/10/2015 14:56    EKG: Independently reviewed. Rate 78, sinus rhythm, QTC 446. No ST-T wave changes.    Assessment/Plan 1. Chest pain: This is new.  The patient has HEART score of 4. Chest pain is accelerating, exertional and now associated with light exertion but relieved with nitro.    -Heparin gtt initiated, then discontinued after discussion with Cardiology -Serial troponins are ordered -Telemetry -Echocardiogram ordered -Consult to cardiology, appreciate recommendations -Check lipid panel   2. HTN:  Normotensive for now.  Patient has several medication sensitivities (to full dose lisinopril, to losartan, etc).   -Continue home clonidine  3. Pre-diabetes:  -Check HgbA1c  4. Asthma:  Clinically inactive -Albuterol inhaled, PRN  5. Normocytic anemia:  Unclear chronicity or etiology.  No report of melena or bleeding.  6. Leukocytosis:  Likely reactive. -Trend CBC     DVT  PPx: Lovenox Diet: Heart healthy Consultants: Cardiology Code Status: Full Family Communication: None present Patient seen 2:33 AM on 12/11/2015.   Medical decision making: What exists of the patient's chart was reviewed in depth, and the patient was discussed with Dr. Tamera Punt and Dr. Philbert Riser.  Disposition Plan:  Initiate heparin, Cardiology evaluation this morning.  Serial troponins and subsequent risk stratification by Cardiology.  Further disposition pending cardiology evaluation.      Edwin Dada [facility] Pager [phone]

## 2015-12-11 NOTE — Progress Notes (Signed)
  Stress test was recommended for this patient to risk stratify, however given her weight of 313 pounds she will require a 2 day study. We were unable to complete resting portion of stress test today as patient ate breakfast and lunch. Unfortunately the patient reports that she would be unable to remain inpatient for further testing as her son has cerebral palsy and she is his primary caregiver. There are no other family members to help care for her son. Dr. Debara Pickett has reviewed her 2-D echocardiogram which reveals normal LV function and wall motion. He has cleared the patient for discharge from a cardiac standpoint. We will arrange for her to get the stress test done in our office as an outpatient. We will also arrange follow-up for her to see Dr. Debara Pickett following her stress test. Her nurse has been notified who will notify her primary team for discharge.  Liam Cammarata, Silas Flood 12/11/2015

## 2015-12-11 NOTE — Discharge Instructions (Signed)
Heart-Healthy Eating Plan °Many factors influence your heart health, including eating and exercise habits. Heart (coronary) risk increases with abnormal blood fat (lipid) levels. Heart-healthy meal planning includes limiting unhealthy fats, increasing healthy fats, and making other small dietary changes. This includes maintaining a healthy body weight to help keep lipid levels within a normal range. °WHAT IS MY PLAN?  °Your health care provider recommends that you: °· Get no more than _________% of the total calories in your daily diet from fat. °· Limit your intake of saturated fat to less than _________% of your total calories each day. °· Limit the amount of cholesterol in your diet to less than _________ mg per day. °WHAT TYPES OF FAT SHOULD I CHOOSE? °· Choose healthy fats more often. Choose monounsaturated and polyunsaturated fats, such as olive oil and canola oil, flaxseeds, walnuts, almonds, and seeds. °· Eat more omega-3 fats. Good choices include salmon, mackerel, sardines, tuna, flaxseed oil, and ground flaxseeds. Aim to eat fish at least two times each week. °· Limit saturated fats. Saturated fats are primarily found in animal products, such as meats, butter, and cream. Plant sources of saturated fats include palm oil, palm kernel oil, and coconut oil. °· Avoid foods with partially hydrogenated oils in them. These contain trans fats. Examples of foods that contain trans fats are stick margarine, some tub margarines, cookies, crackers, and other baked goods. °WHAT GENERAL GUIDELINES DO I NEED TO FOLLOW? °· Check food labels carefully to identify foods with trans fats or high amounts of saturated fat. °· Fill one half of your plate with vegetables and green salads. Eat 4-5 servings of vegetables per day. A serving of vegetables equals 1 cup of raw leafy vegetables, ½ cup of raw or cooked cut-up vegetables, or ½ cup of vegetable juice. °· Fill one fourth of your plate with whole grains. Look for the word  "whole" as the first word in the ingredient list. °· Fill one fourth of your plate with lean protein foods. °· Eat 4-5 servings of fruit per day. A serving of fruit equals one medium whole fruit, ¼ cup of dried fruit, ½ cup of fresh, frozen, or canned fruit, or ½ cup of 100% fruit juice. °· Eat more foods that contain soluble fiber. Examples of foods that contain this type of fiber are apples, broccoli, carrots, beans, peas, and barley. Aim to get 20-30 g of fiber per day. °· Eat more home-cooked food and less restaurant, buffet, and fast food. °· Limit or avoid alcohol. °· Limit foods that are high in starch and sugar. °· Avoid fried foods. °· Cook foods by using methods other than frying. Baking, boiling, grilling, and broiling are all great options. Other fat-reducing suggestions include: °¨ Removing the skin from poultry. °¨ Removing all visible fats from meats. °¨ Skimming the fat off of stews, soups, and gravies before serving them. °¨ Steaming vegetables in water or broth. °· Lose weight if you are overweight. Losing just 5-10% of your initial body weight can help your overall health and prevent diseases such as diabetes and heart disease. °· Increase your consumption of nuts, legumes, and seeds to 4-5 servings per week. One serving of dried beans or legumes equals ½ cup after being cooked, one serving of nuts equals 1½ ounces, and one serving of seeds equals ½ ounce or 1 tablespoon. °· You may need to monitor your salt (sodium) intake, especially if you have high blood pressure. Talk with your health care provider or dietitian to get   more information about reducing sodium. °WHAT FOODS CAN I EAT? °Grains °Breads, including French, white, pita, wheat, raisin, rye, oatmeal, and Italian. Tortillas that are neither fried nor made with lard or trans fat. Low-fat rolls, including hotdog and hamburger buns and English muffins. Biscuits. Muffins. Waffles. Pancakes. Light popcorn. Whole-grain cereals. Flatbread. Melba  toast. Pretzels. Breadsticks. Rusks. Low-fat snacks and crackers, including oyster, saltine, matzo, graham, animal, and rye. Rice and pasta, including brown rice and those that are made with whole wheat. °Vegetables °All vegetables. °Fruits °All fruits, but limit coconut. °Meats and Other Protein Sources °Lean, well-trimmed beef, veal, pork, and lamb. Chicken and turkey without skin. All fish and shellfish. Wild duck, rabbit, pheasant, and venison. Egg whites or low-cholesterol egg substitutes. Dried beans, peas, lentils, and tofu. Seeds and most nuts. °Dairy °Low-fat or nonfat cheeses, including ricotta, string, and mozzarella. Skim or 1% milk that is liquid, powdered, or evaporated. Buttermilk that is made with low-fat milk. Nonfat or low-fat yogurt. °Beverages °Mineral water. Diet carbonated beverages. °Sweets and Desserts °Sherbets and fruit ices. Honey, jam, marmalade, jelly, and syrups. Meringues and gelatins. Pure sugar candy, such as hard candy, jelly beans, gumdrops, mints, marshmallows, and small amounts of dark chocolate. Angel food cake. °Eat all sweets and desserts in moderation. °Fats and Oils °Nonhydrogenated (trans-free) margarines. Vegetable oils, including soybean, sesame, sunflower, olive, peanut, safflower, corn, canola, and cottonseed. Salad dressings or mayonnaise that are made with a vegetable oil. Limit added fats and oils that you use for cooking, baking, salads, and as spreads. °Other °Cocoa powder. Coffee and tea. All seasonings and condiments. °The items listed above may not be a complete list of recommended foods or beverages. Contact your dietitian for more options. °WHAT FOODS ARE NOT RECOMMENDED? °Grains °Breads that are made with saturated or trans fats, oils, or whole milk. Croissants. Butter rolls. Cheese breads. Sweet rolls. Donuts. Buttered popcorn. Chow mein noodles. High-fat crackers, such as cheese or butter crackers. °Meats and Other Protein Sources °Fatty meats, such as  hotdogs, short ribs, sausage, spareribs, bacon, ribeye roast or steak, and mutton. High-fat deli meats, such as salami and bologna. Caviar. Domestic duck and goose. Organ meats, such as kidney, liver, sweetbreads, brains, gizzard, chitterlings, and heart. °Dairy °Cream, sour cream, cream cheese, and creamed cottage cheese. Whole milk cheeses, including blue (bleu), Monterey Jack, Brie, [place], American, Havarti, Swiss, cheddar, Camembert, and [place].  Whole or 2% milk that is liquid, evaporated, or condensed. Whole buttermilk. Cream sauce or high-fat cheese sauce. Yogurt that is made from whole milk. °Beverages °Regular sodas and drinks with added sugar. °Sweets and Desserts °Frosting. Pudding. Cookies. Cakes other than angel food cake. Candy that has milk chocolate or white chocolate, hydrogenated fat, butter, coconut, or unknown ingredients. Buttered syrups. Full-fat ice cream or ice cream drinks. °Fats and Oils °Gravy that has suet, meat fat, or shortening. Cocoa butter, hydrogenated oils, palm oil, coconut oil, palm kernel oil. These can often be found in baked products, candy, fried foods, nondairy creamers, and whipped toppings. Solid fats and shortenings, including bacon fat, salt pork, lard, and butter. Nondairy cream substitutes, such as coffee creamers and sour cream substitutes. Salad dressings that are made of unknown oils, cheese, or sour cream. °The items listed above may not be a complete list of foods and beverages to avoid. Contact your dietitian for more information. °  °This information is not intended to replace advice given to you by your health care provider. Make sure you discuss any questions you have with your health   care provider. °  °Document Released: 05/26/2008 Document Revised: 09/07/2014 Document Reviewed: 02/08/2014 °Elsevier Interactive Patient Education ©2016 [employer] ° °

## 2015-12-11 NOTE — H&P (Signed)
History and Physical  Patient Name: Kelli Horn     [id number]    DOB: [birth year]    DOA: 12/10/2015 Referring physician: Malvin Johns, MD PCP: No primary care provider on file.      Chief Complaint: Chest pain  HPI: Kelli Horn is a 60 y.o. female with a past medical history significant for HTN poorly controlled, former smoker, asthma who presents with chest pain.  The pain started about two weeks ago and was initially exertional, mild tightness, relieved with rest.  Would come on if she walked from her house out to her son's trailer and back, then go away.  Over last week, it has become more frequent, longer lasting, and provoked by less exertion.  It is now a "vice-like" chest tightness, that takes her breath away and is not improved with albuterol.  Tonight it was relieved well with nitroglycerin by EMS.    In the ED, the patient's initial ECG showed no ischemic changes and troponin was negative.  TRH was asked to admit for observation, serial troponins and risk stratification.    The patient is concerned that her new BP medication losartan could be causing this (was switched from lisinopril because of dizziness/malaise at higher dose).  Also has been taking prednisone for neck pain and one dose oral albuterol for asthma this past few weeks.  She is a former smoker, quit a few years ago, smoked for 15 years.  Father with MI, deceased from it around age 89.  Has pre-diabetes.  Has uncontrolled HTN.     Review of Systems:  All other systems negative except as just noted or noted in the history of present illness.  No Known Allergies  Prior to Admission medications   Medication Sig Start Date End Date Taking? Authorizing Provider  albuterol (PROVENTIL HFA;VENTOLIN HFA) 108 (90 Base) MCG/ACT inhaler Inhale 2 puffs into the lungs every 6 (six) hours as needed for wheezing or shortness of breath.   Yes Historical Provider, MD  aspirin EC 81 MG tablet Take 81 mg by mouth daily.    Yes Historical Provider, MD  cloNIDine (CATAPRES) 0.2 MG tablet Take 0.2 mg by mouth 2 (two) times daily.   Yes Historical Provider, MD  ibuprofen (ADVIL,MOTRIN) 200 MG tablet Take 400 mg by mouth every 6 (six) hours as needed for headache.   Yes Historical Provider, MD  loratadine (CLARITIN) 10 MG tablet Take 10 mg by mouth daily.   Yes Historical Provider, MD  methocarbamol (ROBAXIN) 500 MG tablet Take 500 mg by mouth every 6 (six) hours as needed for muscle spasms.   Yes Historical Provider, MD    Past Medical History  Diagnosis Date  . Hypertension   . Diabetes mellitus without complication ([place])   . Asthma     Past Surgical History  Procedure Laterality Date  . Appendectomy      Family history: family history includes Breast cancer in her maternal aunt; Heart attack (age of onset: 27) in her father; Lung cancer in her mother.  Social History: Patient lives next to her son.  She is a former smoker, quit several years ago, smoked for 15 years.  No alcohol.  Does not currently work. Ambulates without cane, participates in all ADLs.       Physical Exam: BP 162/60 mmHg  Pulse 78  Temp(Src) 97.9 F (36.6 C) (Oral)  Resp 18  Ht 5\' 10"  (1.778 m)  Wt 142.112 kg (313 lb 4.8 oz)  BMI 44.95 kg/m2  SpO2  93%  LMP 12/09/2013 General appearance: Well-developed, adult female, alert and in no acute distress.   Eyes: Anicteric, conjunctiva pink, lids and lashes normal.     ENT: No nasal deformity, discharge, or epistaxis.  OP moist without lesions.  Poor dentition. Skin: Warm and dry.   Cardiac: RRR, nl S1-S2, 2/6 SEM, patient states this is old.  Capillary refill is brisk.  JVP not visible.  No LE edema.  Radial pulses 2+ and symmetric.  No carotid bruits. Respiratory: Normal respiratory rate and rhythm.  CTAB without rales or wheezes. Abdomen: Abdomen soft without rigidity.  No TTP. No ascites, distension.   MSK: No deformities or effusions.   Pain not reproduced with palpation of  precordium or arm movement. Neuro: Sensorium intact and responding to questions, attention normal.  Speech is fluent.  Moves all extremities equally and with normal coordination.    Psych: Behavior appropriate.  Affect normal.  No evidence of aural or visual hallucinations or delusions.       Labs on Admission:  The metabolic panel shows normal electrolytes and renal function. The complete blood count shows mild leukocytosis, normocytic anemia. The initial troponin is negative.  Radiological Exams on Admission: Personally reviewed: Dg Chest 2 View  12/10/2015  CLINICAL DATA:  Chest pain. Squeezing. Shortness of breath with exertion. EXAM: CHEST  2 VIEW COMPARISON:  None. FINDINGS: Midline trachea. Mild cardiomegaly. Atherosclerosis in the transverse aorta. Exam degraded by patient body habitus. No definite pleural fluid. No congestive failure. No lobar consolidation. No pneumothorax. IMPRESSION: Mild cardiomegaly, without congestive failure. Electronically Signed   By: Abigail Miyamoto M.D.   On: 12/10/2015 14:56    EKG: Independently reviewed. Rate 78, sinus rhythm, QTC 446. No ST-T wave changes.    Assessment/Plan 1. Chest pain: This is new.  The patient has HEART score of 4. Chest pain is accelerating, exertional and now associated with light exertion but relieved with nitro.    -Heparin gtt initiated, then discontinued after discussion with Cardiology -Serial troponins are ordered -Telemetry -Echocardiogram ordered -Consult to cardiology, appreciate recommendations -Check lipid panel   2. HTN:  Normotensive for now.  Patient has several medication sensitivities (to full dose lisinopril, to losartan, etc).   -Continue home clonidine  3. Pre-diabetes:  -Check HgbA1c  4. Asthma:  Clinically inactive -Albuterol inhaled, PRN  5. Normocytic anemia:  Unclear chronicity or etiology.  No report of melena or bleeding.  6. Leukocytosis:  Likely reactive. -Trend CBC     DVT  PPx: Lovenox Diet: Heart healthy Consultants: Cardiology Code Status: Full Family Communication: None present Patient seen 2:33 AM on 12/11/2015.   Medical decision making: What exists of the patient's chart was reviewed in depth, and the patient was discussed with Dr. Tamera Punt and Dr. Philbert Riser.  Disposition Plan:  Initiate heparin, Cardiology evaluation this morning.  Serial troponins and subsequent risk stratification by Cardiology.  Further disposition pending cardiology evaluation.      Edwin Dada [facility] Pager [phone]

## 2015-12-11 NOTE — Progress Notes (Signed)
ANTICOAGULATION CONSULT NOTE - Initial Consult  Pharmacy Consult for heparin Indication: chest pain/ACS  No Known Allergies  Patient Measurements: Height: 5\' 10"  (177.8 cm) Weight: (!) 313 lb 4.8 oz (142.112 kg) IBW/kg (Calculated) : 68.5 Heparin Dosing Weight: 100kg  Vital Signs: Temp: 97.7 F (36.5 C) (04/12 0100) Temp Source: Oral (04/12 0100) BP: 156/93 mmHg (04/12 0100) Pulse Rate: 82 (04/12 0100)  Labs:  Recent Labs  12/10/15 1411  HGB 10.7*  HCT 33.8*  PLT 366  CREATININE 0.92    Estimated Creatinine Clearance: 103 mL/min (by C-G formula based on Cr of 0.92).   Medical History: Past Medical History  Diagnosis Date  . Hypertension   . Diabetes mellitus without complication ([place])   . Asthma     Medications:  Prescriptions prior to admission  Medication Sig Dispense Refill Last Dose  . albuterol (PROVENTIL HFA;VENTOLIN HFA) 108 (90 Base) MCG/ACT inhaler Inhale 2 puffs into the lungs every 6 (six) hours as needed for wheezing or shortness of breath.   12/10/2015 at Unknown time  . aspirin EC 81 MG tablet Take 81 mg by mouth daily.   12/10/2015 at Unknown time  . cloNIDine (CATAPRES) 0.2 MG tablet Take 0.2 mg by mouth 2 (two) times daily.   12/09/2015 at Unknown time  . ibuprofen (ADVIL,MOTRIN) 200 MG tablet Take 400 mg by mouth every 6 (six) hours as needed for headache.   12/10/2015 at Unknown time  . loratadine (CLARITIN) 10 MG tablet Take 10 mg by mouth daily.   12/10/2015 at Unknown time  . methocarbamol (ROBAXIN) 500 MG tablet Take 500 mg by mouth every 6 (six) hours as needed for muscle spasms.   3-4 days    Assessment: 60yo female c/o CP off and on x5wk at PCP, relieved w/ NTG x1, tx'd to [facility] for further eval, to begin heparin.  Goal of Therapy:  Heparin level 0.3-0.7 units/ml Monitor platelets by anticoagulation protocol: Yes   Plan:  Will give heparin 4000 units IV bolus x1 followed by gtt at 1400 units/hr and monitor heparin levels and  CBC.  Wynona Neat, PharmD, BCPS  12/11/2015,2:34 AM

## 2015-12-12 ENCOUNTER — Telehealth (HOSPITAL_COMMUNITY): Payer: Self-pay | Admitting: *Deleted

## 2015-12-12 LAB — HEMOGLOBIN A1C
HEMOGLOBIN A1C: 6.5 % — AB (ref 4.8–5.6)
MEAN PLASMA GLUCOSE: 140 mg/dL

## 2015-12-12 NOTE — Discharge Summary (Signed)
[facility] Discharge Summary   Patient: Kelli Horn [id number]   PCP: [name], MD DOB: 27-Aug-1956   Date of admission: 12/10/2015   Date of discharge: 12/11/2015     Discharge Diagnoses:  Principal Problem:   Chest pain Active Problems:   Hypertension   Asthma   Normocytic anemia   Leukocytosis   Prediabetes   Chest pain with moderate risk for cardiac etiology   Dyslipidemia   Pain in the chest  Recommendations for Outpatient Follow-up:  1. Follow-up with cardiology for outpatient stress test. 2. Follow-up with PCP in one week.   Follow-up Information    Follow up with Pixie Casino, MD. Schedule an appointment as soon as possible for a visit in 2 weeks.   Specialty:  Cardiology   Why:  follow up for stress test   Contact information:   [place] [address] [phone]       Follow up with [name], MD. Schedule an appointment as soon as possible for a visit in 1 week.   Specialty:  Family Medicine   Contact information:   [place] [address] [phone]      Diet recommendation: Cardiac diet  Activity: The patient is advised to gradually reintroduce usual activities.  Discharge Condition: good  History of present illness: As per the H and P dictated on admission, "Kelli Horn is a 60 y.o. female with a past medical history significant for HTN poorly controlled, former smoker, asthma who presents with chest pain.  The pain started about two weeks ago and was initially exertional, mild tightness, relieved with rest. Would come on if she walked from her house out to her son's trailer and back, then go away. Over last week, it has become more frequent, longer lasting, and provoked by less exertion. It is now a "vice-like" chest tightness, that takes her breath away and is not improved with albuterol. Tonight it was relieved well with nitroglycerin by EMS.   In the ED, the  patient's initial ECG showed no ischemic changes and troponin was negative. [facility] was asked to admit for observation, serial troponins and risk stratification.   The patient is concerned that her new BP medication losartan could be causing this (was switched from lisinopril because of dizziness/malaise at higher dose). Also has been taking prednisone for neck pain and one dose oral albuterol for asthma this past few weeks.  She is a former smoker, quit a few years ago, smoked for 15 years. Father with MI, deceased from it around age 70. Has pre-diabetes. Has uncontrolled HTN."  Hospital Course:  Summary of her active problems in the hospital is as following.  1. Chest pain: Chest pain is accelerating, exertional and associated with light exertion  relieved with nitro.  In the ER Heparin gtt initiated, then discontinued after discussion with Cardiology -Serial troponins are negative -Telemetry normal -Echocardiogram shows normal EF without any wall motion abnormality -Consult to cardiology, appreciate recommendations She was scheduled for outpatient stress test for further workup of her chest pain  2. HTN:  Normotensive for now.  Continue home clonidine  3. Pre-diabetes:  -Hemoglobin A1c 6.5.  4. Asthma:  Clinically inactive -Albuterol inhaled, PRN  5. Normocytic anemia:  Unclear chronicity or etiology. No report of melena or bleeding.  6. Leukocytosis:  Likely reactive.   All other chronic medical condition were stable during the hospitalization.  Patient was ambulatory without any assistance. On the day of the discharge the  patient's chest pain resolved and vitals were stable, and no other acute medical condition were reported by patient. the patient was felt safe to be discharge at home with family.  Procedures and Results:  Echocardiogram   Consultations:  Cardiology  DISCHARGE MEDICATION: Discharge Medication List as of 12/11/2015  4:43 PM    START  taking these medications   Details  atorvastatin (LIPITOR) 40 MG tablet Take 1 tablet (40 mg total) by mouth daily at 6 PM., Starting 12/11/2015, Until Discontinued, Normal    nitroGLYCERIN (NITROSTAT) 0.4 MG SL tablet Place 1 tablet (0.4 mg total) under the tongue every 5 (five) minutes as needed for chest pain., Starting 12/11/2015, Until Discontinued, Normal      CONTINUE these medications which have NOT CHANGED   Details  albuterol (PROVENTIL HFA;VENTOLIN HFA) 108 (90 Base) MCG/ACT inhaler Inhale 2 puffs into the lungs every 6 (six) hours as needed for wheezing or shortness of breath., Until Discontinued, Historical Med    aspirin EC 81 MG tablet Take 81 mg by mouth daily., Until Discontinued, Historical Med    cloNIDine (CATAPRES) 0.2 MG tablet Take 0.2 mg by mouth 2 (two) times daily., Until Discontinued, Historical Med    loratadine (CLARITIN) 10 MG tablet Take 10 mg by mouth daily., Until Discontinued, Historical Med    methocarbamol (ROBAXIN) 500 MG tablet Take 500 mg by mouth every 6 (six) hours as needed for muscle spasms., Until Discontinued, Historical Med      STOP taking these medications     ibuprofen (ADVIL,MOTRIN) 200 MG tablet        No Known Allergies Discharge Instructions    Diet - low sodium heart healthy    Complete by:  As directed      Discharge instructions    Complete by:  As directed   It is important that you read following instructions as well as go over your medication list with RN to help you understand your care after this hospitalization.  Discharge Instructions: Please follow-up with PCP in one week Please follow up with cardiology in 1-2 weeks.   Please request your primary care physician to go over all Hospital Tests and Procedure/Radiological results at the follow up,  Please get all Hospital records sent to your PCP by signing hospital release before you go home.   You were cared for by a hospitalist during your hospital stay. If you have  any questions about your discharge medications or the care you received while you were in the hospital after you are discharged, you can call the unit and ask to speak with the hospitalist on call if the hospitalist that took care of you is not available.  Once you are discharged, your primary care physician will handle any further medical issues. Please note that NO REFILLS for any discharge medications will be authorized once you are discharged, as it is imperative that you return to your primary care physician (or establish a relationship with a primary care physician if you do not have one) for your aftercare needs so that they can reassess your need for medications and monitor your lab values. You Must read complete instructions/literature along with all the possible adverse reactions/side effects for all the Medicines you take and that have been prescribed to you. Take any new Medicines after you have completely understood and accept all the possible adverse reactions/side effects. Wear Seat belts while driving. If you have smoked or chewed Tobacco in the last 2 yrs please stop smoking and/or  stop any Recreational drug use.     Increase activity slowly    Complete by:  As directed           Discharge Exam: Filed Weights   12/10/15 1406 12/11/15 0100  Weight: 141.522 kg (312 lb) 142.112 kg (313 lb 4.8 oz)   Filed Vitals:   12/11/15 1239 12/11/15 1701  BP: 167/70 157/72  Pulse: 84   Temp: 97.8 F (36.6 C)   Resp: 18    General: Appear in no distress, no Rash; Oral Mucosa moist. Cardiovascular: S1 and S2 Present, no Murmur, no JVD Respiratory: Bilateral Air entry present and Clear to Auscultation, n Crackles, ono wheezes Abdomen: Bowel Sound present, Soft and no tenderness Extremities: no Pedal edema, no calf tenderness Neurology: Grossly no focal neuro deficit.  The results of significant diagnostics from this hospitalization (including imaging, microbiology, ancillary and  laboratory) are listed below for reference.    Significant Diagnostic Studies: Dg Chest 2 View  12/10/2015  CLINICAL DATA:  Chest pain. Squeezing. Shortness of breath with exertion. EXAM: CHEST  2 VIEW COMPARISON:  None. FINDINGS: Midline trachea. Mild cardiomegaly. Atherosclerosis in the transverse aorta. Exam degraded by patient body habitus. No definite pleural fluid. No congestive failure. No lobar consolidation. No pneumothorax. IMPRESSION: Mild cardiomegaly, without congestive failure. Electronically Signed   By: Abigail Miyamoto M.D.   On: 12/10/2015 14:56    Microbiology: No results found for this or any previous visit (from the past 240 hour(s)).   Labs: CBC:  Recent Labs Lab 12/10/15 1411  WBC 12.7*  HGB 10.7*  HCT 33.8*  MCV 82.2  PLT [id number]   Basic Metabolic Panel:  Recent Labs Lab 12/10/15 1411  NA 139  K 4.0  CL 103  CO2 23  GLUCOSE 129*  BUN 7  CREATININE 0.92  CALCIUM 9.1   Liver Function Tests: No results for input(s): AST, ALT, ALKPHOS, BILITOT, PROT, ALBUMIN in the last 168 hours. No results for input(s): LIPASE, AMYLASE in the last 168 hours. No results for input(s): AMMONIA in the last 168 hours. Cardiac Enzymes:  Recent Labs Lab 12/11/15 0246 12/11/15 0623  TROPONINI <0.03 <0.03   BNP (last 3 results)  Recent Labs  12/11/15 0023  BNP 260.0*   CBG:  Recent Labs Lab 12/11/15 0109 12/11/15 0732 12/11/15 1151  GLUCAP 158* 90 117*   Time spent: 30 minutes  Signed:  Myldred Raju  [facility] 12/11/2015 , 6:21 PM

## 2015-12-12 NOTE — Telephone Encounter (Signed)
Patient given detailed instructions per Myocardial Perfusion Study Information Sheet for the test on 12/17/15 at 10:00. Patient notified to arrive 15 minutes early and that it is imperative to arrive on time for appointment to keep from having the test rescheduled.  If you need to cancel or reschedule your appointment, please call the office within 24 hours of your appointment. Failure to do so may result in a cancellation of your appointment, and a $50 no show fee. Patient verbalized understanding.Veronia Beets

## 2015-12-17 ENCOUNTER — Ambulatory Visit (HOSPITAL_COMMUNITY): Payer: Self-pay

## 2015-12-17 ENCOUNTER — Telehealth: Payer: Self-pay | Admitting: Internal Medicine

## 2015-12-17 NOTE — Telephone Encounter (Signed)
Closed encounter °

## 2015-12-18 ENCOUNTER — Ambulatory Visit (HOSPITAL_COMMUNITY): Payer: Self-pay

## 2015-12-23 ENCOUNTER — Telehealth (HOSPITAL_COMMUNITY): Payer: Self-pay | Admitting: *Deleted

## 2015-12-23 NOTE — Telephone Encounter (Signed)
Patient given detailed instructions per Myocardial Perfusion Study Information Sheet for the test on 12/25/15 at 1245. Patient notified to arrive 15 minutes early and that it is imperative to arrive on time for appointment to keep from having the test rescheduled.  If you need to cancel or reschedule your appointment, please call the office within 24 hours of your appointment. Failure to do so may result in a cancellation of your appointment, and a $50 no show fee. Patient verbalized understanding.Maddison Kilner, Ranae Palms

## 2015-12-25 ENCOUNTER — Ambulatory Visit (HOSPITAL_COMMUNITY): Payer: Self-pay | Attending: Cardiovascular Disease

## 2015-12-25 DIAGNOSIS — E119 Type 2 diabetes mellitus without complications: Secondary | ICD-10-CM | POA: Insufficient documentation

## 2015-12-25 DIAGNOSIS — I10 Essential (primary) hypertension: Secondary | ICD-10-CM | POA: Insufficient documentation

## 2015-12-25 DIAGNOSIS — R079 Chest pain, unspecified: Secondary | ICD-10-CM

## 2015-12-25 DIAGNOSIS — R0602 Shortness of breath: Secondary | ICD-10-CM | POA: Insufficient documentation

## 2015-12-25 MED ORDER — REGADENOSON 0.4 MG/5ML IV SOLN
0.4000 mg | Freq: Once | INTRAVENOUS | Status: AC
  Administered 2015-12-25: 0.4 mg via INTRAVENOUS

## 2015-12-25 MED ORDER — TECHNETIUM TC 99M SESTAMIBI GENERIC - CARDIOLITE
32.6000 | Freq: Once | INTRAVENOUS | Status: AC | PRN
  Administered 2015-12-25: 33 via INTRAVENOUS

## 2015-12-26 ENCOUNTER — Ambulatory Visit (HOSPITAL_COMMUNITY): Payer: Self-pay

## 2015-12-26 LAB — MYOCARDIAL PERFUSION IMAGING
CHL CUP NUCLEAR SSS: 9
LV dias vol: 76 mL (ref 46–106)
LVSYSVOL: 29 mL
NUC STRESS TID: 1.1
Peak HR: 115 {beats}/min
RATE: 0.27
Rest HR: 103 {beats}/min
SDS: 6
SRS: 3

## 2015-12-26 MED ORDER — TECHNETIUM TC 99M SESTAMIBI GENERIC - CARDIOLITE
32.6000 | Freq: Once | INTRAVENOUS | Status: AC | PRN
  Administered 2015-12-26: 33 via INTRAVENOUS

## 2015-12-30 ENCOUNTER — Encounter (HOSPITAL_COMMUNITY): Payer: Self-pay | Admitting: Nurse Practitioner

## 2015-12-30 ENCOUNTER — Telehealth: Payer: Self-pay | Admitting: Internal Medicine

## 2015-12-30 ENCOUNTER — Emergency Department (HOSPITAL_COMMUNITY): Payer: Self-pay

## 2015-12-30 ENCOUNTER — Emergency Department (HOSPITAL_COMMUNITY)
Admission: EM | Admit: 2015-12-30 | Discharge: 2015-12-30 | Disposition: A | Discharge status: Home / Self Care | Payer: Self-pay | Attending: Emergency Medicine | Admitting: Emergency Medicine

## 2015-12-30 DIAGNOSIS — R0789 Other chest pain: Secondary | ICD-10-CM | POA: Insufficient documentation

## 2015-12-30 DIAGNOSIS — E119 Type 2 diabetes mellitus without complications: Secondary | ICD-10-CM | POA: Insufficient documentation

## 2015-12-30 DIAGNOSIS — J45909 Unspecified asthma, uncomplicated: Secondary | ICD-10-CM | POA: Insufficient documentation

## 2015-12-30 DIAGNOSIS — Z7982 Long term (current) use of aspirin: Secondary | ICD-10-CM | POA: Insufficient documentation

## 2015-12-30 DIAGNOSIS — I10 Essential (primary) hypertension: Secondary | ICD-10-CM | POA: Insufficient documentation

## 2015-12-30 DIAGNOSIS — Z79899 Other long term (current) drug therapy: Secondary | ICD-10-CM | POA: Insufficient documentation

## 2015-12-30 LAB — CBC
HCT: 39.6 % (ref 36.0–46.0)
HEMOGLOBIN: 13.3 g/dL (ref 12.0–15.0)
MCH: 27 pg (ref 26.0–34.0)
MCHC: 33.6 g/dL (ref 30.0–36.0)
MCV: 80.5 fL (ref 78.0–100.0)
PLATELETS: 489 10*3/uL — AB (ref 150–400)
RBC: 4.92 MIL/uL (ref 3.87–5.11)
RDW: 13.7 % (ref 11.5–15.5)
WBC: 13.4 10*3/uL — ABNORMAL HIGH (ref 4.0–10.5)

## 2015-12-30 LAB — BASIC METABOLIC PANEL
ANION GAP: 13 (ref 5–15)
BUN: 14 mg/dL (ref 6–20)
CALCIUM: 9.6 mg/dL (ref 8.9–10.3)
CO2: 24 mmol/L (ref 22–32)
CREATININE: 0.84 mg/dL (ref 0.44–1.00)
Chloride: 99 mmol/L — ABNORMAL LOW (ref 101–111)
GFR calc Af Amer: >60 mL/min (ref >60)
GLUCOSE: 119 mg/dL — AB (ref 65–99)
Potassium: 4.1 mmol/L (ref 3.5–5.1)
Sodium: 136 mmol/L (ref 135–145)

## 2015-12-30 LAB — I-STAT TROPONIN, ED: TROPONIN I, POC: 0 ng/mL (ref 0.00–0.08)

## 2015-12-30 NOTE — Discharge Instructions (Signed)
Chest Wall Pain °Chest wall pain is pain in or around the bones and muscles of your chest. Sometimes, an injury causes this pain. Sometimes, the cause may not be known. This pain may take several weeks or longer to get better. °HOME CARE °Pay attention to any changes in your symptoms. Take these actions to help with your pain: °· Rest as told by your doctor. °· Avoid activities that cause pain. Try not to use your chest, belly (abdominal), or side muscles to lift heavy things. °· If directed, apply ice to the painful area: °¨ Put ice in a plastic bag. °¨ Place a towel between your skin and the bag. °¨ Leave the ice on for 20 minutes, 2-3 times per day. °· Take over-the-counter and prescription medicines only as told by your doctor. °· Do not use tobacco products, including cigarettes, chewing tobacco, and e-cigarettes. If you need help quitting, ask your doctor. °· Keep all follow-up visits as told by your doctor. This is important. °GET HELP IF: °· You have a fever. °· Your chest pain gets worse. °· You have new symptoms. °GET HELP RIGHT AWAY IF: °· You feel sick to your stomach (nauseous) or you throw up (vomit). °· You feel sweaty or light-headed. °· You have a cough with phlegm (sputum) or you cough up blood. °· You are short of breath. °  °This information is not intended to replace advice given to you by your health care provider. Make sure you discuss any questions you have with your health care provider. °  °Document Released: 02/03/2008 Document Revised: 05/08/2015 Document Reviewed: 11/12/2014 °Elsevier Interactive Patient Education ©2016 [employer] ° °

## 2015-12-30 NOTE — ED Notes (Addendum)
Pt c/o 2 week history of midsternal CP and right sided facial numbness and tingling. She describes the pain as "burning, cramping." she denies SOB, fevers. She reports cough, nausea. She has taken nitro several times with no relief so cardiologist told there to come to ED. she was admitted for similar symptoms recently. She is alert and breathing easily

## 2015-12-30 NOTE — Telephone Encounter (Signed)
New Message  Pt calling to discuss Myoview results. Please call back and discuss.

## 2015-12-30 NOTE — Telephone Encounter (Signed)
Pt calling to report she has increasing chest pain. She reports numbness/pain in her jaw, tightness in the right side of her neck, and nausea. She says the pain is hurting more than normal. She took nitroglycerin 2-3 times over the weekend and it took nearly 45 min to relieve the pain. Denies SOB, left arm pain, or sweating. BP is 170/90. Pt recently had a stress test read as being "low risk." She had gone to ER 12/10/15 and underwent testing; however, she had to leave in order to care for her special needs son.  These s/s taken to Dr Debara Pickett for advice. He advised that if it were angina the nitroglycerin should have worked within 5 minutes. He advised the pt could go on to the ER to be checked more in depth. Pt verbalized understanding and will go to the ER.

## 2015-12-30 NOTE — ED Provider Notes (Signed)
CSN: [id number]     Arrival date & time 12/30/15  1531 History   First MD Initiated Contact with Patient 12/30/15 1821     Chief Complaint  Patient presents with  . Chest Pain     (Consider location/radiation/quality/duration/timing/severity/associated sxs/prior Treatment) HPI 60 y.o. female with a hx of HTN, DM, morbid obesity presents to the ED noting a 2 week hx of intermittent right sided chest pains which radiate up slightly into her right lateral neck. She described them a cramping and burning, moderate in severity. They seem to come on at random and then abate completely. She states that she was initially treating them successfully with robaxin and ibuprofen but was advised to stop taking this medicine with concern for her sx coming from cardaic ischemia. She was recommended to take nitroglycerin instead, which she has been doing intermittently to no avail of her sx. She denies any trauma or significant change in her activity level that may have precipitated an injury. She had a recent chemical stress test which was negative for any inducible ischemia. No hx of prior CAD/MI. She states that she has associated slight tingling sensation radiating up her neck when she gets the cramping sensations in her chest but denies any other focal neurologic deficits. She currently denies any sx.   Past Medical History  Diagnosis Date  . Hypertension   . Diabetes mellitus without complication ([place])   . Asthma    Past Surgical History  Procedure Laterality Date  . Appendectomy     Family History  Problem Relation Age of Onset  . Heart attack Father 57  . Breast cancer Maternal Aunt   . Lung cancer Mother   . Diabetes     Social History  Substance Use Topics  . Smoking status: Never Smoker   . Smokeless tobacco: None  . Alcohol Use: No   OB History    No data available     Review of Systems  Constitutional: Negative for fever, chills and diaphoresis.  HENT: Negative for sore throat.     Respiratory: Negative for cough, chest tightness and shortness of breath.   Cardiovascular: Positive for chest pain. Negative for palpitations and leg swelling.  Gastrointestinal: Negative for nausea, vomiting, abdominal pain and diarrhea.  Musculoskeletal: Negative for back pain and neck pain.  Skin: Negative for rash.  Neurological: Negative for dizziness, syncope, weakness, light-headedness, numbness and headaches.  All other systems reviewed and are negative.     Allergies  Review of patient's allergies indicates no known allergies.  Home Medications   Prior to Admission medications   Medication Sig Start Date End Date Taking? Authorizing Provider  acetaminophen (TYLENOL) 500 MG tablet Take 1,000 mg by mouth every 6 (six) hours as needed (pain).   Yes Historical Provider, MD  albuterol (PROVENTIL HFA;VENTOLIN HFA) 108 (90 Base) MCG/ACT inhaler Inhale 2 puffs into the lungs every 6 (six) hours as needed for wheezing or shortness of breath.   Yes Historical Provider, MD  aspirin EC 81 MG tablet Take 81 mg by mouth daily.   Yes Historical Provider, MD  atorvastatin (LIPITOR) 40 MG tablet Take 1 tablet (40 mg total) by mouth daily at 6 PM. 12/11/15  Yes Lavina Hamman, MD  Enalapril-Hydrochlorothiazide 5-12.5 MG tablet Take 1 tablet by mouth 2 (two) times daily.   Yes Historical Provider, MD  loratadine (CLARITIN) 10 MG tablet Take 10 mg by mouth daily.   Yes Historical Provider, MD  methocarbamol (ROBAXIN) 500 MG tablet Take  500 mg by mouth every 6 (six) hours as needed for muscle spasms.   Yes Historical Provider, MD  nitroGLYCERIN (NITROSTAT) 0.4 MG SL tablet Place 1 tablet (0.4 mg total) under the tongue every 5 (five) minutes as needed for chest pain. 12/11/15  Yes Lavina Hamman, MD  Polyethyl Glycol-Propyl Glycol (SYSTANE ULTRA) 0.4-0.3 % SOLN Place 1 drop into both eyes 2 (two) times daily.   Yes Historical Provider, MD   BP 156/70 mmHg  Pulse 98  Temp(Src) 97.8 F (36.6 C)  (Oral)  Resp 19  SpO2 95%  LMP 12/09/2013 Physical Exam  Constitutional: She is oriented to person, place, and time. She appears well-developed and well-nourished. No distress.  HENT:  Head: Normocephalic and atraumatic.  Nose: Nose normal.  Mouth/Throat: Oropharynx is clear and moist.  Eyes: Conjunctivae and EOM are normal. Pupils are equal, round, and reactive to light.  Neck: Normal range of motion. Neck supple.  No bruits   Cardiovascular: Normal rate, regular rhythm, normal heart sounds and intact distal pulses.   Pulmonary/Chest: Effort normal and breath sounds normal. She exhibits tenderness. She exhibits no crepitus.    Abdominal: Soft. She exhibits no distension. There is no tenderness.  Musculoskeletal: She exhibits no edema or tenderness.  Neurological: She is alert and oriented to person, place, and time. She has normal strength. No cranial nerve deficit or sensory deficit. Coordination normal. GCS eye subscore is 4. GCS verbal subscore is 5. GCS motor subscore is 6.  Skin: Skin is warm and dry. No rash noted. She is not diaphoretic.  Nursing note and vitals reviewed.   ED Course  Procedures (including critical care time) Labs Review Labs Reviewed  BASIC METABOLIC PANEL - Abnormal; Notable for the following:    Chloride 99 (*)    Glucose, Bld 119 (*)    All other components within normal limits  CBC - Abnormal; Notable for the following:    WBC 13.4 (*)    Platelets 489 (*)    All other components within normal limits  I-STAT TROPOININ, ED    Imaging Review Dg Chest 2 View  12/30/2015  CLINICAL DATA:  Central chest pain for 2 months getting worse EXAM: CHEST  2 VIEW COMPARISON:  12/10/2015 FINDINGS: Stable cardiac silhouette.  Vascular pattern normal.  Lungs clear. IMPRESSION: No active cardiopulmonary disease. Electronically Signed   By: Skipper Cliche M.D.   On: 12/30/2015 16:27   I have personally reviewed and evaluated these images and lab results as part  of my medical decision-making.   EKG Interpretation   Date/Time:  Monday Dec 30 2015 15:37:19 EDT Ventricular Rate:  104 PR Interval:  154 QRS Duration: 78 QT Interval:  344 QTC Calculation: 452 R Axis:   75 Text Interpretation:  Sinus tachycardia Otherwise normal ECG rate is  faster, otherwise no significant change since April 12 Confirmed by  Regenia Skeeter MD, SCOTT [phone]) on 12/31/2015 1:53:55 PM      MDM  60 y.o. female with hx of HTN and DM presents with right sided chest pain intermittently x 2 weeks that she initially successfully treated with ibuprofen and robaxin, told to stop, use nitro instead to no avail. She had a recent ruled out with chemical stress test which was very reassuring. Physical exam and hx suggestive of MSK chest pain given reproducibility with palpation and movement. EKG shows sinus tach, but no significant ST or t-wave abnormalities suggestive of ischemia. VSS, AF, with no tachycardia noted on exam, normal oxygen saturation.  Low suspicion for PE given no significant risk factory, no pleurisy. No LE edema or calf tenderness. Labs were drawn and showed mildly elevated WBC of 13.3, but otherwise reassuring with negative troponin, normal CXR. Given recent negative nuclear medicine stress testing and reproducibility feel that this is likely right sided MSK chest pain. She was recommended to continue taking intermittent ibuprofen and robaxin, as needed as it seemed to work previously, and stop taking nitroglycerin as her sx do not seem to be coming from cardiac ischemia. She was recommended to follow up with her PCP in a few days to further evaluate her sx and return precautions were given for significant worsening. This plan was discussed with the patient and her husband at the bedside and they stated both understanding and agreement with this plan.   Final diagnoses:  Right-sided chest wall pain       Zenovia Jarred, DO 01/01/16 0009  Leo Grosser, MD 01/01/16  1620

## 2016-01-10 ENCOUNTER — Encounter: Payer: Self-pay | Admitting: Internal Medicine

## 2016-01-10 ENCOUNTER — Ambulatory Visit (INDEPENDENT_AMBULATORY_CARE_PROVIDER_SITE_OTHER): Payer: Self-pay | Admitting: Internal Medicine

## 2016-01-10 VITALS — BP 142/88 | HR 110 | Ht 70.0 in | Wt 304.0 lb

## 2016-01-10 DIAGNOSIS — R410 Disorientation, unspecified: Secondary | ICD-10-CM

## 2016-01-10 DIAGNOSIS — R519 Headache, unspecified: Secondary | ICD-10-CM

## 2016-01-10 DIAGNOSIS — J4540 Moderate persistent asthma, uncomplicated: Secondary | ICD-10-CM

## 2016-01-10 DIAGNOSIS — R2681 Unsteadiness on feet: Secondary | ICD-10-CM | POA: Insufficient documentation

## 2016-01-10 DIAGNOSIS — R079 Chest pain, unspecified: Secondary | ICD-10-CM

## 2016-01-10 DIAGNOSIS — Z87898 Personal history of other specified conditions: Secondary | ICD-10-CM | POA: Insufficient documentation

## 2016-01-10 DIAGNOSIS — R51 Headache: Secondary | ICD-10-CM

## 2016-01-10 NOTE — Progress Notes (Signed)
OFFICE NOTE  Chief Complaint:  Hospital and stress test follow-up  Primary Care Physician: Kelli Downing, MD  HPI:  Kelli Horn is a 60 y.o. female with past medical history significant for hypertension, tobacco abuse, obesity and asthma. She presented the hospital recently with chest pain which is described as a burning quality over the right breast. She also noted pain in her right face and neck that extended down over the right anterior chest. She reports she's had some progressive weakness, fatigue and lower extremity loss of power. She also says that she's had some confusion episodes as well as gait instability. In the hospital she was evaluated ruled out for coronary causes of her chest pain. She underwent outpatient nuclear stress testing which was negative for ischemia and showed normal LV function. Today when questioned further she reports her symptoms of pain have been progressively worsening along with fatigue and shortness of breath over the past several years. She now has significant problems with walking and balance.  PMHx:  Past Medical History  Diagnosis Date  . Hypertension   . Diabetes mellitus without complication ([place])   . Asthma     Past Surgical History  Procedure Laterality Date  . Appendectomy      FAMHx:  Family History  Problem Relation Age of Onset  . Heart attack Father 58  . Breast cancer Maternal Aunt   . Lung cancer Mother   . Diabetes      SOCHx:   reports that she has never smoked. She does not have any smokeless tobacco history on file. She reports that she does not drink alcohol or use illicit drugs.  ALLERGIES:  No Known Allergies  ROS: Pertinent items noted in HPI and remainder of comprehensive ROS otherwise negative.  HOME MEDS: Current Outpatient Prescriptions  Medication Sig Dispense Refill  . acetaminophen (TYLENOL) 500 MG tablet Take 1,000 mg by mouth every 6 (six) hours as needed (pain).    Marland Kitchen albuterol (PROVENTIL  HFA;VENTOLIN HFA) 108 (90 Base) MCG/ACT inhaler Inhale 2 puffs into the lungs every 6 (six) hours as needed for wheezing or shortness of breath.    Marland Kitchen aspirin EC 81 MG tablet Take 81 mg by mouth daily.    . Enalapril-Hydrochlorothiazide 5-12.5 MG tablet Take 1 tablet by mouth 2 (two) times daily.    Marland Kitchen loratadine (CLARITIN) 10 MG tablet Take 10 mg by mouth daily.    . methocarbamol (ROBAXIN) 500 MG tablet Take 500 mg by mouth every 6 (six) hours as needed for muscle spasms.    . nitroGLYCERIN (NITROSTAT) 0.4 MG SL tablet Place 1 tablet (0.4 mg total) under the tongue every 5 (five) minutes as needed for chest pain. 30 tablet 0  . Polyethyl Glycol-Propyl Glycol (SYSTANE ULTRA) 0.4-0.3 % SOLN Place 1 drop into both eyes 2 (two) times daily.     No current facility-administered medications for this visit.    LABS/IMAGING: No results found for this or any previous visit (from the past 48 hour(s)). No results found.  WEIGHTS: Wt Readings from Last 3 Encounters:  01/10/16 304 lb (137.893 kg)  12/25/15 313 lb (141.976 kg)  12/11/15 313 lb 4.8 oz (142.112 kg)    VITALS: BP 142/88 mmHg  Pulse 110  Ht 5\' 10"  (1.778 m)  Wt 304 lb (137.893 kg)  BMI 43.62 kg/m2  LMP 12/09/2013  EXAM: Deferred  EKG: Deferred  ASSESSMENT: 1. Atypical chest pain - low risk myoview 2. Progressive weakness, imbalance and what sounds like  neuropathic pain  PLAN: 1.   [name] has had progressive weakness, and balance and hemifacial pain as well as leg heaviness, loss of power, fatigue and sharp right-sided chest pain which also has a burning quality. This is very atypical for coronary ischemia and although once it was improved with nitroglycerin, recently it has not responded to nitroglycerin. Her stress test was low risk and is reassuring. I suspect her symptoms may be related to a neurologic or neuromuscular disorder. One of the possibilities is multiple sclerosis. I like to refer her to Dr. Brett Horn who is  a specialist in this for further evaluation.  Kelli Casino, MD, [facility] Attending Cardiologist [facility] Kelli Horn 01/10/2016, 6:16 PM

## 2016-01-10 NOTE — Patient Instructions (Addendum)
You have been referred to Dr. Larey Seat (neurologist)    Your physician recommends that you schedule a follow-up appointment as needed with Dr. Debara Pickett.

## 2016-01-28 ENCOUNTER — Telehealth: Payer: Self-pay | Admitting: Neurology

## 2016-01-28 NOTE — Telephone Encounter (Signed)
Message For: OFFICE               Taken 30-MAY-17 at  1:05PM by [facility] ------------------------------------------------------------  Ayesha Mohair Bedoya              CID  [id number]   Patient  SAME                  Pt's Dr  Beacher May       Area Code  336  Phone#  [phone]       RE  NEEDS TO MAKE A APPT/KEEPS CALLING AND NEVER      CAN GET ANYONE IN THE OFFICE/VERY AGGREVATED          Disp:Y/N  N  If Y = C/B If No Response In 31minutes  ============================================================  appt has been made through new pt coordinator

## 2016-02-13 ENCOUNTER — Encounter: Payer: Self-pay | Admitting: Neurology

## 2016-02-13 ENCOUNTER — Ambulatory Visit (INDEPENDENT_AMBULATORY_CARE_PROVIDER_SITE_OTHER): Payer: Self-pay | Admitting: Neurology

## 2016-02-13 VITALS — BP 152/92 | HR 94 | Resp 20 | Ht 71.0 in | Wt 307.0 lb

## 2016-02-13 DIAGNOSIS — G4721 Circadian rhythm sleep disorder, delayed sleep phase type: Secondary | ICD-10-CM

## 2016-02-13 DIAGNOSIS — G47411 Narcolepsy with cataplexy: Secondary | ICD-10-CM

## 2016-02-13 DIAGNOSIS — G4730 Sleep apnea, unspecified: Secondary | ICD-10-CM

## 2016-02-13 DIAGNOSIS — G4710 Hypersomnia, unspecified: Secondary | ICD-10-CM

## 2016-02-13 DIAGNOSIS — K551 Chronic vascular disorders of intestine: Secondary | ICD-10-CM

## 2016-02-13 DIAGNOSIS — R29898 Other symptoms and signs involving the musculoskeletal system: Secondary | ICD-10-CM

## 2016-02-13 MED ORDER — ALPRAZOLAM 0.5 MG PO TABS: 0.5000 mg | ORAL_TABLET | Freq: Every evening | ORAL | Status: DC | PRN

## 2016-02-13 MED ORDER — ACETAZOLAMIDE 125 MG PO TABS: 125.0000 mg | ORAL_TABLET | Freq: Three times a day (TID) | ORAL | Status: DC

## 2016-02-13 NOTE — Progress Notes (Signed)
SLEEP MEDICINE CLINIC   Provider:  Larey Seat, M D  Referring Provider: Dr Guy Sandifer, Primary Care Physician:  [name], MD  Chief Complaint  Patient presents with  . New Patient (Initial Visit)    having problems with walking and balance, 20 years ago, she can't go to the mall or stores any more    HPI:  Kelli Horn is a 60 y.o. female , seen here as a referral from Dr. Debara Pickett, cardiology, for evaluation of sensory loss, balance and gait changes and sleep attacks.    Chief complaint according to patient :  Her chief complaint today is a progressive loss of ambulatory capacity, and she gave me a good example. 20 years ago while her children for still teenagers she would be up on her feet all day at her job and then walked over and shopping with some and could cruise through the mall without any problems. 10 years later she could not even managed to go through [place] anymore or any other mega store, the last 5 years she cannot even walk through [employer]. Now she has even more frequent attacks of immobilization the inability to leave her apartment or house, her right leg feels as if it wants to turn in, her left leg fields heavy and weak. There is a mother with chief complaint and that is that of the left arm being weaker and also numb in the lateral 2 fingers ring finger and small finger. She will have a sharp pain sensation at the biceps, and doesn't have the right grip strength. She feels as if she can't lift the arm up neither an object in her hands. She states she has not enough strength to get a can of  "Mountain dew " out of her refrigerator.  Her sleep concerned were tertiary, she reports sleep attacks throughout the day !. She has a sudden irresistible urge to go to sleep usually with a 3 or 4 hours after rising from her nocturnal sleep. Most of these naps are 5 -20 minutes long and recharge her for another 2 or 3 hours of function. She has vivid dreams especially if  she ate before going to bed late at night. Her dreams are vivid but almost nightmarish in Scientist, research (physical sciences). The content is bizarre, and the dreams are associated with an ominous feeling. A sense of doom. Sometimes she will have these attacks of sleep or dropping in is right after she had a carbohydrate rich meal. Periodic hypokalemia? She has sometimes migraines, she does not know if she snores, but she reports gasping for air and being air hungry when she wakes up.  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 One shift was  6:00 Am in the morning, the second was was about 4 PM, she was able to take power naps at that time and she would work 2 weeks in a row without a day off.   Sleep habits are as follows:  The patient and her husband are both'night owls'in both carotid bed between 3 and 4 in the morning usually, she will wake up once at 7:30 to go to the bathroom and from thereon every hour of sleep. She will have 5 nocturia breaks at night. She will finally rise  about 11 AM, and between noon and 3 PM she usually is ready to sleep again ,feels exhausted and has " this irresistible urge" to go to sleep. If she is seated she  can drop off for just 5 minutes and sets an alarm so that she doesn't take too much time mapping. Usually 15-20 minutes refresh her. As a younger person she battled depression and it was felt that she was sleepier because of this underlying condition. Sleep may have been her escape than. 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    Sleep medical history and family sleep history:  Shift work, likes to work nights, midnight to 6 AM, delayed circadian rhythm/ Became Obese in the last 15 years , after quit smoking, gained a 100 pounds.  HTN developed, gained another 50 pounds. Diabetic .  Social history:  Married , non smoker, non ETOH, caffeine : diet mountain dew 3 a day and 1 cup of  coffee.  47 year old son with cerebral palsy lives at home- she is the main caretaker, he has diabetes 2.  Review of Systems: Out of a complete 14 system review, the patient complains of only the following symptoms, and all other reviewed systems are negative.  RLS while on Flexaril,  chest pain, non cardiac,left arm weakness and pain , bilateral leg clumsiness,   Neck stiffness, sleepiness, delayed sleep syndrome, spine pain between the shoulder blades.   Social History   Social History  . Marital Status: Married    Spouse Name: N/A  . Number of Children: N/A  . Years of Education: N/A   Occupational History  . Not on file.   Social History Main Topics  . Smoking status: Never Smoker   . Smokeless tobacco: Not on file  . Alcohol Use: No  . Drug Use: No  . Sexual Activity: Not on file   Other Topics Concern  . Not on file   Social History Narrative    Family History  Problem Relation Age of Onset  . Heart attack Father 88  . Breast cancer Maternal Aunt   . Lung cancer Mother   . Diabetes      Past Medical History  Diagnosis Date  . Hypertension   . Diabetes mellitus without complication ([place])   . Asthma     Past Surgical History  Procedure Laterality Date  . Appendectomy      Current Outpatient Prescriptions  Medication Sig Dispense Refill  . acetaminophen (TYLENOL) 500 MG tablet Take 1,000 mg by mouth every 6 (six) hours as needed (pain).    Marland Kitchen albuterol (PROVENTIL HFA;VENTOLIN HFA) 108 (90 Base) MCG/ACT inhaler Inhale 2 puffs into the lungs every 6 (six) hours as needed for wheezing or shortness of breath.    Marland Kitchen aspirin EC 81 MG tablet Take 81 mg by mouth daily.    . Enalapril-Hydrochlorothiazide 5-12.5 MG tablet Take 1 tablet by mouth 2 (two) times daily.    Marland Kitchen loratadine (CLARITIN) 10 MG tablet Take 10 mg by mouth daily.    . methocarbamol (ROBAXIN) 500 MG tablet Take 500 mg by mouth every 6 (six) hours as needed for muscle spasms.    . nitroGLYCERIN  (NITROSTAT) 0.4 MG SL tablet Place 1 tablet (0.4 mg total) under the tongue every 5 (five) minutes as needed for chest pain. 30 tablet 0  . Polyethyl Glycol-Propyl Glycol (SYSTANE ULTRA) 0.4-0.3 % SOLN Place 1 drop into both eyes 2 (two) times daily.     No current facility-administered medications for this visit.    Allergies as of 02/13/2016  . (No Known  Allergies)    Vitals: BP 152/92 mmHg  Pulse 94  Resp 20  Ht 5' 11"  (1.803 m)  Wt 307 lb (139.254 kg)  BMI 42.84 kg/m2  LMP 12/09/2013 Last Weight:  Wt Readings from Last 1 Encounters:  02/13/16 307 lb (139.254 kg)   FOY:DXAJ mass index is 42.84 kg/(m^2).     Last Height:   Ht Readings from Last 1 Encounters:  02/13/16 5' 11"  (1.803 m)    Physical exam:  General: The patient is awake, alert and appears not in acute distress. The patient is well groomed. Head: Normocephalic, atraumatic. Neck is supple. Mallampati Grade 4, neck circumference 17 of,,  . Nasal airflow patent, TMJ click is not evident . Retrognathia is seen. The patient has her biological teeth, Cardiovascular:  Regular rate and rhythm, without  murmurs or carotid bruit, and without distended neck veins. Respiratory: Lungs are clear to auscultation. Skin:  Without evidence of edema, or rash Trunk: BMI is elevated, super-obesity.  The patient's posture is hunched.   Neurologic exam : The patient is awake and alert, oriented to place and time.   Memory subjective  described as intact.  Attention span & concentration ability appears limited, tangential.   Speech is fluent,  without  dysarthria, dysphonia or aphasia.  Mood and affect are appropriate.  Cranial nerves: Pupils are equal and briskly reactive to light. Funduscopic exam without   evidence of pallor or edema. Extraocular movements  in vertical and horizontal planes intact and without nystagmus. Visual fields by finger perimetry are intact.Hearing to finger rub intact. Facial sensation intact to fine  touch.Facial motor strength is symmetric and tongue and uvula move midline. Shoulder shrug was symmetrical.   Motor exam: Unable to reach with her left arm towards the back pocket , and to tie an apron inthe back . She was placed on prednisone Dosepak briefly and regained her range of motion for the left shoulder. There is also a resistance to pain and is slightly more droopy left shoulder.  Sensory:  The left shoulder is stiff but there is a pain radiating from the shoulder into the biceps and 2 words he elbow. She also reports triceps weakness and atrophy.   Coordination: Rapid alternating movements in the fingers/hands was normal. Finger-to-nose maneuver  normal without evidence of ataxia, dysmetria or tremor.  Gait and station: Patient walks without assistive device and is able unassisted to climb up to the exam table. Strength within normal limits.  Stance is stable and normal.  Tandem gait is fragmented. Turns with  3.5   Steps. Romberg testing is  negative.  Deep tendon reflexes: in the  upper and lower extremities are symmetric and intact. Babinski maneuver response is equivocal.  The patient was advised of the nature of the diagnosed  disorder , the treatment options and risks for general a health and wellness arising from not treating the condition.  I spent more than minutes of face to face time with the patient. Greater than 50% of time was spent in counseling and coordination of care. We have discussed the diagnosis and differential and I answered the patient's questions.     Assessment:  After physical and neurologic examination, review of laboratory studies,  Personal review of imaging studies, reports of other /same  Imaging studies ,  Results of polysomnography/ neurophysiology testing and pre-existing records as far as provided in visit., my assessment is   1) my first workup will be for her clumsiness in gait, periodic weakness r  the second for left arm weakness and pain. I  will order an MRI of the brain and cervical spine., She has reported coordination, cognitive and memory problems. My goal is to establish if she has vascular insults or given the balance motor and sensory problems any evidence of demyelinating disease of the spine or brain.     2) the patient's left arm range of motion limitation is probably a rotator cuff injury. She cannot lift her arm above shoulder height she cannot inward rotate and retroflex. This may be more of an orthopedic problem.  3) her chest tightness and pain were not related to a cardiac injury but she did have some right jaw pain and facial numbness with it. It was considered costochondritis pain  4) I am fascinated by her report of generalized weakness and sleep attacks even drop attacks after a carbohydrate rich meal, especially when she mentioned spaghetti. There is hyper and hypokalemic periodic weakness that needs to be evaluated.  5) her sleep attacks, she has been and excessively daytime sleepy person with a circadian delayed rhythm for much of her adult life. She has worked nights shifts which she enjoyed. She finds 5-20 minutes naps refreshing and restoring, she reports vivid dreams and being bothered by her dreams enough that it sometimes concerning and that she doesn't necessarily enjoy sleeping. I will order an HLA test for narcolepsy cataplexy. I will also invite her for sleep study.  Plan:  Treatment plan and additional workup :  MRI brain and cervical spine with and without contrast to evaluate for demyelinating and vascular lesions.  HLA narcolepsy panel  The patient reported sudden generalized weakness after a carbohydrate rich meal. By many patients will get sleepy after a carbohydrate rich meal she gets weak. 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 The treatment would be acetazolamide.    Asencion Partridge Philomene Haff MD  02/13/2016  CC  Guy Sandifer, MD  CC: [name], [place], [place] [address]

## 2016-02-13 NOTE — Patient Instructions (Signed)
Acetazolamide tablets What is this medicine? ACETAZOLAMIDE (a set a ZOLE a mide) is used to treat glaucoma and some seizure disorders. It may be used to treat edema or swelling from heart failure or from other medicines. This medicine is also used to treat and to prevent altitude or mountain sickness. This medicine may be used for other purposes; ask your health care provider or pharmacist if you have questions. What should I tell my health care provider before I take this medicine? They need to know if you have any of these conditions: -diabetes -kidney disease -liver disease -lung disease -an unusual or allergic reaction to acetazolamide, sulfa drugs, other medicines, foods, dyes, or preservatives -pregnant or trying to get pregnant -breast-feeding How should I use this medicine? Take this medicine by mouth with a glass of water. Follow the directions on the prescription label. Take this medicine with food if it upsets your stomach. Take your doses at regular intervals. Do not take your medicine more often than directed. Do not stop taking except on your doctor's advice. Talk to your pediatrician regarding the use of this medicine in children. Special care may be needed. Patients over 67 years old may have a stronger reaction and need a smaller dose. Overdosage: If you think you have taken too much of this medicine contact a poison control center or emergency room at once. NOTE: This medicine is only for you. Do not share this medicine with others. What if I miss a dose? If you miss a dose, take it as soon as you can. If it is almost time for your next dose, take only that dose. Do not take double or extra doses. What may interact with this medicine? Do not take this medicine with any of the following medications: -methazolamide This medicine may also interact with the following medications: -aspirin and aspirin-like medicines -cyclosporine -lithium -medicine for  diabetes -methenamine -other diuretics -phenytoin -primidone -quinidine -sodium bicarbonate -stimulant medicines like dextroamphetamine This list may not describe all possible interactions. Give your health care provider a list of all the medicines, herbs, non-prescription drugs, or dietary supplements you use. Also tell them if you smoke, drink alcohol, or use illegal drugs. Some items may interact with your medicine. What should I watch for while using this medicine? Visit your doctor or health care professional for regular checks on your progress. You will need blood work done regularly. If you are diabetic, check your blood sugar as directed. You may need to be on a special diet while taking this medicine. Ask your doctor. Also, ask how many glasses of fluid you need to drink a day. You must not get dehydrated. You may get drowsy or dizzy. Do not drive, use machinery, or do anything that needs mental alertness until you know how this medicine affects you. Do not stand or sit up quickly, especially if you are an older patient. This reduces the risk of dizzy or fainting spells. This medicine can make you more sensitive to the sun. Keep out of the sun. If you cannot avoid being in the sun, wear protective clothing and use sunscreen. Do not use sun lamps or tanning beds/booths. What side effects may I notice from receiving this medicine? Side effects that you should report to your doctor or health care professional as soon as possible: -allergic reactions like skin rash, itching or hives, swelling of the face, lips, or tongue -breathing problems -confusion, depression -dark urine -fever -numbness, tingling in hands or feet -redness, blistering, peeling or  loosening of the skin, including inside the mouth -ringing in the ears -seizures -unusually weak or tired -yellowing of the eyes or skin Side effects that usually do not require medical attention (report to your doctor or health care  professional if they continue or are bothersome): -change in taste -diarrhea -headache -loss of appetite -nausea, vomiting -passing urine more often This list may not describe all possible side effects. Call your doctor for medical advice about side effects. You may report side effects to FDA at 1-800-FDA-1088. Where should I keep my medicine? Keep out of the reach of children. Store at room temperature between 20 and 25 degrees C (68 and 77 degrees F). Throw away any unused medicine after the expiration date. NOTE: This sheet is a summary. It may not cover all possible information. If you have questions about this medicine, talk to your doctor, pharmacist, or health care provider.    2016, Elsevier/Gold Standard. (2007-11-09 10:59:40)

## 2016-02-23 ENCOUNTER — Ambulatory Visit
Admission: RE | Admit: 2016-02-23 | Discharge: 2016-02-23 | Disposition: A | Discharge status: Home / Self Care | Payer: Self-pay | Source: Ambulatory Visit | Attending: Neurology | Admitting: Neurology

## 2016-02-23 ENCOUNTER — Other Ambulatory Visit: Payer: Self-pay | Admitting: Neurology

## 2016-02-23 DIAGNOSIS — G4721 Circadian rhythm sleep disorder, delayed sleep phase type: Secondary | ICD-10-CM

## 2016-02-23 DIAGNOSIS — R29898 Other symptoms and signs involving the musculoskeletal system: Secondary | ICD-10-CM

## 2016-02-23 DIAGNOSIS — G47411 Narcolepsy with cataplexy: Secondary | ICD-10-CM

## 2016-02-23 DIAGNOSIS — K551 Chronic vascular disorders of intestine: Secondary | ICD-10-CM

## 2016-02-23 DIAGNOSIS — G4730 Sleep apnea, unspecified: Principal | ICD-10-CM

## 2016-02-23 DIAGNOSIS — G4710 Hypersomnia, unspecified: Secondary | ICD-10-CM

## 2016-02-23 MED ORDER — GADOBENATE DIMEGLUMINE 529 MG/ML IV SOLN
20.0000 mL | Freq: Once | INTRAVENOUS | Status: AC | PRN
Start: 2016-02-23 — End: 2016-02-23
  Administered 2016-02-23: 20 mL via INTRAVENOUS

## 2016-03-04 ENCOUNTER — Ambulatory Visit
Admission: RE | Admit: 2016-03-04 | Discharge: 2016-03-04 | Disposition: A | Discharge status: Home / Self Care | Payer: No Typology Code available for payment source | Source: Ambulatory Visit | Attending: Neurology | Admitting: Neurology

## 2016-03-04 DIAGNOSIS — R29898 Other symptoms and signs involving the musculoskeletal system: Secondary | ICD-10-CM

## 2016-03-04 DIAGNOSIS — K551 Chronic vascular disorders of intestine: Secondary | ICD-10-CM

## 2016-03-04 DIAGNOSIS — G4710 Hypersomnia, unspecified: Secondary | ICD-10-CM

## 2016-03-04 DIAGNOSIS — G47411 Narcolepsy with cataplexy: Secondary | ICD-10-CM

## 2016-03-04 DIAGNOSIS — G4721 Circadian rhythm sleep disorder, delayed sleep phase type: Secondary | ICD-10-CM

## 2016-03-04 DIAGNOSIS — G4730 Sleep apnea, unspecified: Principal | ICD-10-CM

## 2016-03-04 MED ORDER — GADOBENATE DIMEGLUMINE 529 MG/ML IV SOLN
20.0000 mL | Freq: Once | INTRAVENOUS | Status: AC | PRN
Start: 2016-03-04 — End: 2016-03-04
  Administered 2016-03-04: 20 mL via INTRAVENOUS

## 2016-03-11 ENCOUNTER — Telehealth: Payer: Self-pay

## 2016-03-11 DIAGNOSIS — R29898 Other symptoms and signs involving the musculoskeletal system: Secondary | ICD-10-CM

## 2016-03-11 NOTE — Telephone Encounter (Signed)
I spoke to pt. I advised her that her MRI cervical spine was normal-per Dr. Brett Fairy. Pt is asking if Dr. Brett Fairy will refer her to an orthopedic surgeon as dicussed (since she can't move her arm to the back) or what the next step is?  I advised pt that I would call her back with Dr. Edwena Felty recommendations.

## 2016-03-11 NOTE — Telephone Encounter (Signed)
I called pt to discuss MRI results. No answer, left a message asking her to call me back. 

## 2016-03-11 NOTE — Telephone Encounter (Signed)
I called pt to discuss. No answer, left a message asking her to call me back. 

## 2016-03-11 NOTE — Telephone Encounter (Signed)
I would think she needs to see sport medicine or physical Rehab MD, i suggest Stefanie Libel, He is gentle , well informed and does a great exam. CD

## 2016-03-11 NOTE — Telephone Encounter (Signed)
-----   Message from Larey Seat, MD sent at 03/10/2016  5:00 PM EDT ----- Normal cervical spine. CD

## 2016-03-12 NOTE — Telephone Encounter (Signed)
I called pt again to discuss. No answer, left a message asking her to call me back. 

## 2016-03-12 NOTE — Telephone Encounter (Signed)
I spoke to Kelli Horn. I advised Kelli Horn that Dr. Brett Fairy recommends Dr. Oneida Alar, a sport's medicine physician. Kelli Horn is agreeable to this. I advised her that our office will sent the paperwork to Dr. Nona Dell office and his office will call her for an appt. Kelli Horn verbalized understanding.

## 2016-03-18 NOTE — Telephone Encounter (Signed)
Patient returned Kristen's call. Patient states she was told by Dr. Alinda Money office that our office hasn't sent anything, she had to start with Dr. Micheline Chapman, "they acted like they didn't want to schedule me with Dr. Oneida Alar", states she was told "he has to review before they can schedule". Please send information and mark it Attn: Dr. Micheline Chapman.

## 2016-03-18 NOTE — Addendum Note (Signed)
Addended by: Lester Henderson A on: 03/18/2016 02:14 PM   Modules accepted: Orders

## 2016-03-18 NOTE — Addendum Note (Signed)
Addended by: Lester [place] A on: 03/18/2016 01:55 PM   Modules accepted: Orders

## 2016-03-18 NOTE — Telephone Encounter (Signed)
Patient called to advise, Dr. Odel[facility] has not received referral from our office  Fax to Attn: Dr. Micheline Chapman [phone].

## 2016-03-18 NOTE — Telephone Encounter (Signed)
Referral placed to Dr. Micheline Chapman.  Kelli Horn, will you please send this referral this afternoon?

## 2016-03-18 NOTE — Telephone Encounter (Signed)
Dr. Brett Fairy recommended Dr. Oneida Alar. If pt wants to see Dr. Micheline Chapman, we will need to change the referral.  I called pt to discuss. No answer, left a message asking her to call me back.

## 2016-03-24 ENCOUNTER — Other Ambulatory Visit: Payer: Self-pay | Admitting: Sports Medicine

## 2016-03-24 ENCOUNTER — Ambulatory Visit (INDEPENDENT_AMBULATORY_CARE_PROVIDER_SITE_OTHER): Payer: Self-pay | Admitting: Sports Medicine

## 2016-03-24 ENCOUNTER — Ambulatory Visit
Admission: RE | Admit: 2016-03-24 | Discharge: 2016-03-24 | Disposition: A | Discharge status: Home / Self Care | Payer: No Typology Code available for payment source | Source: Ambulatory Visit | Attending: Sports Medicine | Admitting: Sports Medicine

## 2016-03-24 ENCOUNTER — Encounter: Payer: Self-pay | Admitting: Sports Medicine

## 2016-03-24 VITALS — BP 139/69 | HR 90 | Ht 70.0 in | Wt 310.0 lb

## 2016-03-24 DIAGNOSIS — M25512 Pain in left shoulder: Secondary | ICD-10-CM

## 2016-03-24 DIAGNOSIS — M549 Dorsalgia, unspecified: Secondary | ICD-10-CM

## 2016-03-24 DIAGNOSIS — M546 Pain in thoracic spine: Secondary | ICD-10-CM

## 2016-03-24 NOTE — Assessment & Plan Note (Signed)
Thoracic Back Pain.  Followed by neurology for this issue. Pain radiating from back to chest consistent with facet arthropathy - Obtain thoracic spine x-ray - Follow up with results

## 2016-03-24 NOTE — Progress Notes (Signed)
   [facility] Kerrin Mo, MD Phone: [phone]  Reason For Visit: Left shoulder Pain   # At christmas, she states that she was sleeping and she woke up and started having muscle spasms in her left shoulder. Patient could not reach in to get a soda from the fridge or use the ATM due to the pain in her shoulder. Seen by PCP and recieved predinose for this pain. Pain started to improve, however she had to stop using it due to side effects. No weakness. No swelling. No numbness. No radiation of pain down her arm.  Back Pain: Currently being seen by a neurologist for back pain. Patient states she's been having back pain mid back. Pain occurs acutely and radiates to her chest. When she's having this pain is 10 out of 10, does not have pain all the time. Patient was worked up for possible cardiac causes which were negative. Then patient was seen sent to neurology for further workup. Most recently has had a MRI of the brain and cervical spine. Patient does have a history of steroid use in the past due to asthma. No fevers chills night sweats,. No recent weight loss.  ROS: No weakness, numbness, radiation of pain, swelling, erythema Past Medical History- history of shoulder surgery, or injury Reviewed problem list.  Medications- reviewed and updated No additions to family history Social history- patient is a non- smoker  Objective: BP 139/69   Pulse 90   Ht _0  (1.778 m)   Wt (!) 310 lb (140.6 kg)   LMP 12/09/2013   BMI 44.48 kg/m  Gen: NAD, alert, cooperative with exam Left Shoulder: No abnormalities noted on inspection of left shoulder. Pain with palpation of AC joint or subacromial tenderness. Normal Passive range of motion. Decrease range of motion with abduction, internal and external rotation due to pain. 5 out of 5 strength in the shoulder. Negative empty can test. Neer's and Hawkins positive. Speeds and Yergason's negative. Normal strength from C4-T1. Biceps  and tricep reflexes 1+, equally bilaterally,   Right shoulder:  No abnormalites noted on inspection, no pain with palpation. Normal range of motion. 5 out 5 strength.   Back: No abormalities noted on inspection, no pain with palpation of spinal processes, paraspinal muscle tenderness. Normal range of motion. Normal strength with plantar and dorsal flexion. Patellar and Achilles reflexes 2+ bilaterally.  Assessment/Plan: See problem based a/p  Left shoulder pain Likely with impingement. - shoulder x-ray to determine if presence of any osteoarthritis - Follow up with results.  Upper back pain Thoracic Back Pain.  Followed by neurology for this issue. Pain radiating from back to chest consistent with facet arthropathy - Obtain thoracic spine x-ray - Follow up with results   Patient seen and evaluated with the resident. I agree with the above plan of care. X-rays of her thoracic spine were reviewed. Very mild spondylosis is seen. Other than some mild degenerative changes at the acromioclavicular joint, her left shoulder x-ray is normal. Patient will be notified and we will plan on having her return to the office for a subacromial cortisone injection.

## 2016-03-24 NOTE — Assessment & Plan Note (Signed)
Likely with impingement. - shoulder x-ray to determine if presence of any osteoarthritis - Follow up with results.

## 2016-03-27 ENCOUNTER — Telehealth: Payer: Self-pay | Admitting: Sports Medicine

## 2016-03-27 NOTE — Telephone Encounter (Signed)
I spoke with the patient on the phone today after reviewing the x-rays of her thoracic spine and her left shoulder. Thoracic spine x-rays show some spondylosis. Left shoulder x-ray show some mild acromioclavicular DJD but otherwise unremarkable. I've explained to her that treatment for thoracic spine spondylosis is difficult but I'm optimistic that we can help her left shoulder. I recommended that she return to the office sometime next week for a subacromial cortisone injection and a home exercise program.

## 2016-04-02 ENCOUNTER — Ambulatory Visit (INDEPENDENT_AMBULATORY_CARE_PROVIDER_SITE_OTHER): Payer: Self-pay | Admitting: Sports Medicine

## 2016-04-02 ENCOUNTER — Encounter: Payer: Self-pay | Admitting: Sports Medicine

## 2016-04-02 VITALS — Ht 70.0 in | Wt 300.0 lb

## 2016-04-02 DIAGNOSIS — M25512 Pain in left shoulder: Secondary | ICD-10-CM

## 2016-04-02 MED ORDER — METHYLPREDNISOLONE ACETATE 40 MG/ML IJ SUSP
40.0000 mg | Freq: Once | INTRAMUSCULAR | Status: AC
  Administered 2016-04-02: 40 mg via INTRA_ARTICULAR

## 2016-04-02 NOTE — Progress Notes (Signed)
Patient ID: Marilin Galletti, female   DOB: 19-Jun-1956, 60 y.o.   MRN: [id number]   Patient comes in today for a subacromial cortisone injection. Please see previous office notes, including a recent phone note, for details regarding history, physical exam findings and x-ray findings. In addition to the cortisone injection, we will also educate her and a home Jobe exercise program. Patient will follow-up with me in 3 weeks. If symptoms persist a follow-up then I will consider merits of further diagnostic imaging.  Consent obtained and verified. Time-out conducted. Noted no overlying erythema, induration, or other signs of local infection. Skin prepped in a sterile fashion. Topical analgesic spray: Ethyl chloride. Joint: left subacromial Needle: 25g 1.5 inch Completed without difficulty. Meds: 3cc 1% xylocaine, 1cc (40mg ) depomderol  Advised to call if fevers/chills, erythema, induration, drainage, or persistent bleeding.

## 2016-04-02 NOTE — Addendum Note (Signed)
Addended bySela Hua on: 04/02/2016 03:26 PM   Modules accepted: Orders

## 2016-04-02 NOTE — Progress Notes (Signed)
Pt declined having BP taken

## 2016-04-17 ENCOUNTER — Telehealth: Payer: Self-pay | Admitting: Neurology

## 2016-04-17 DIAGNOSIS — M7552 Bursitis of left shoulder: Secondary | ICD-10-CM

## 2016-04-17 NOTE — Telephone Encounter (Signed)
I indeed suggested Dr. Oneida Alar to evaluate her for rotator cuff pain, but requested her to contact her primary care physician for referral.

## 2016-04-20 ENCOUNTER — Ambulatory Visit: Payer: No Typology Code available for payment source | Admitting: Sports Medicine

## 2016-04-20 NOTE — Telephone Encounter (Signed)
Referral has already been placed for Sports Med per Dr. Edwena Felty orders following pt's normal cervical MRI.

## 2016-05-05 ENCOUNTER — Ambulatory Visit (INDEPENDENT_AMBULATORY_CARE_PROVIDER_SITE_OTHER): Payer: No Typology Code available for payment source | Admitting: Sports Medicine

## 2016-05-05 ENCOUNTER — Encounter: Payer: Self-pay | Admitting: Sports Medicine

## 2016-05-05 VITALS — BP 154/78 | Ht 70.0 in | Wt 300.0 lb

## 2016-05-05 DIAGNOSIS — M25512 Pain in left shoulder: Secondary | ICD-10-CM

## 2016-05-05 MED ORDER — DIAZEPAM 10 MG PO TABS: ORAL_TABLET | ORAL | 0 refills | Status: DC

## 2016-05-05 NOTE — Progress Notes (Signed)
   Subjective:    Patient ID: Kelli Horn, female    DOB: July 31, 1956, 60 y.o.   MRN: [id number]  HPI   Patient comes in today for follow-up on left shoulder pain. Pain has improved by about 50% after a recent subacromial cortisone injection. However, she continues to have decreased range of motion. Her pain is primarily along the lateral shoulder. No nighttime pain. X-rays were unremarkable other than some mild acromioclavicular DJD.    Review of Systems     Objective:   Physical Exam  Obese. No acute distress  Left shoulder: Patient has limited range of motion in all planes. Passive external rotation is limited to 75 compared to 90 on the right. Active full wrist flexion and abduction are both 110  to 120. Internal rotation is 80. Good rotator cuff strength but reproducible pain with resisted supraspinatous. No tenderness over the acromioclavicular joint nor over the bicipital groove. Neurovascularly intact distally.      Assessment & Plan:   Persistent left shoulder pain secondary to adhesive capsulitis-rule out rotator cuff tear  MRI of the left shoulder specifically to rule out a concomitant rotator cuff tear along with her adhesive capsulitis. Phone follow-up after I reviewed that study. If no surgical pathology is seen, then we could consider an intra-articular cortisone injection for her adhesive capsulitis. Patient is in agreement with this plan.

## 2016-05-14 ENCOUNTER — Ambulatory Visit
Admission: RE | Admit: 2016-05-14 | Discharge: 2016-05-14 | Disposition: A | Discharge status: Home / Self Care | Payer: No Typology Code available for payment source | Source: Ambulatory Visit | Attending: Sports Medicine | Admitting: Sports Medicine

## 2016-05-14 DIAGNOSIS — M25512 Pain in left shoulder: Secondary | ICD-10-CM

## 2016-05-19 ENCOUNTER — Ambulatory Visit: Payer: Self-pay | Admitting: Adult Health

## 2016-05-19 ENCOUNTER — Telehealth: Payer: Self-pay | Admitting: Sports Medicine

## 2016-05-19 NOTE — Telephone Encounter (Signed)
-----   Message from Carolyne Littles sent at 05/19/2016  3:10 PM EDT ----- Regarding: returned your call Contact: [phone] About her mri results

## 2016-05-19 NOTE — Telephone Encounter (Signed)
I spoke with the patient on the phone today after reviewing the MRI scan of her left shoulder which was done on September 14. 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 I recommended surgical consultation with Dr.Xu to discuss further treatment. I'll defer further treatment to the discretion of Dr.Xu and the patient will follow-up with me as needed.

## 2016-05-20 ENCOUNTER — Other Ambulatory Visit: Payer: Self-pay | Admitting: *Deleted

## 2016-05-20 DIAGNOSIS — M25512 Pain in left shoulder: Secondary | ICD-10-CM

## 2016-06-01 ENCOUNTER — Ambulatory Visit: Payer: No Typology Code available for payment source | Admitting: Physical Therapy

## 2016-06-03 ENCOUNTER — Ambulatory Visit: Payer: No Typology Code available for payment source | Admitting: Sports Medicine

## 2016-07-07 ENCOUNTER — Ambulatory Visit: Payer: No Typology Code available for payment source | Admitting: Sports Medicine

## 2016-07-10 ENCOUNTER — Ambulatory Visit (INDEPENDENT_AMBULATORY_CARE_PROVIDER_SITE_OTHER): Payer: Self-pay | Admitting: Orthopaedic Surgery

## 2020-01-27 ENCOUNTER — Other Ambulatory Visit: Payer: Self-pay

## 2020-01-27 ENCOUNTER — Encounter (HOSPITAL_COMMUNITY): Payer: Self-pay | Admitting: Emergency Medicine

## 2020-01-27 ENCOUNTER — Emergency Department (HOSPITAL_COMMUNITY)
Admission: EM | Admit: 2020-01-27 | Discharge: 2020-01-27 | Disposition: A | Discharge status: Home / Self Care | Payer: No Typology Code available for payment source | Attending: Emergency Medicine | Admitting: Emergency Medicine

## 2020-01-27 DIAGNOSIS — I10 Essential (primary) hypertension: Secondary | ICD-10-CM | POA: Insufficient documentation

## 2020-01-27 DIAGNOSIS — Z79899 Other long term (current) drug therapy: Secondary | ICD-10-CM | POA: Insufficient documentation

## 2020-01-27 DIAGNOSIS — E119 Type 2 diabetes mellitus without complications: Secondary | ICD-10-CM | POA: Insufficient documentation

## 2020-01-27 DIAGNOSIS — Z7984 Long term (current) use of oral hypoglycemic drugs: Secondary | ICD-10-CM | POA: Insufficient documentation

## 2020-01-27 DIAGNOSIS — R1031 Right lower quadrant pain: Secondary | ICD-10-CM | POA: Insufficient documentation

## 2020-01-27 HISTORY — DX: Gastro-esophageal reflux disease without esophagitis: K21.9

## 2020-01-27 LAB — CBC
HCT: 43.5 % (ref 36.0–46.0)
Hemoglobin: 13.8 g/dL (ref 12.0–15.0)
MCH: 26.3 pg (ref 26.0–34.0)
MCHC: 31.7 g/dL (ref 30.0–36.0)
MCV: 83 fL (ref 80.0–100.0)
Platelets: 389 10*3/uL (ref 150–400)
RBC: 5.24 MIL/uL — ABNORMAL HIGH (ref 3.87–5.11)
RDW: 14.2 % (ref 11.5–15.5)
WBC: 10.1 10*3/uL (ref 4.0–10.5)
nRBC: 0 % (ref 0.0–0.2)

## 2020-01-27 LAB — COMPREHENSIVE METABOLIC PANEL
ALT: 15 U/L (ref 0–44)
AST: 15 U/L (ref 15–41)
Albumin: 3.5 g/dL (ref 3.5–5.0)
Alkaline Phosphatase: 77 U/L (ref 38–126)
Anion gap: 9 (ref 5–15)
BUN: 10 mg/dL (ref 8–23)
CO2: 27 mmol/L (ref 22–32)
Calcium: 9.2 mg/dL (ref 8.9–10.3)
Chloride: 104 mmol/L (ref 98–111)
Creatinine, Ser: 0.78 mg/dL (ref 0.44–1.00)
GFR calc Af Amer: >60 mL/min (ref >60)
GFR calc non Af Amer: >60 mL/min (ref >60)
Glucose, Bld: 105 mg/dL — ABNORMAL HIGH (ref 70–99)
Potassium: 4.4 mmol/L (ref 3.5–5.1)
Sodium: 140 mmol/L (ref 135–145)
Total Bilirubin: 0.7 mg/dL (ref 0.3–1.2)
Total Protein: 7.1 g/dL (ref 6.5–8.1)

## 2020-01-27 LAB — URINALYSIS, ROUTINE W REFLEX MICROSCOPIC
Bilirubin Urine: NEGATIVE
Glucose, UA: NEGATIVE mg/dL
Hgb urine dipstick: NEGATIVE
Ketones, ur: NEGATIVE mg/dL
Leukocytes,Ua: NEGATIVE
Nitrite: NEGATIVE
Protein, ur: NEGATIVE mg/dL
Specific Gravity, Urine: 1.005 (ref 1.005–1.030)
pH: 6 (ref 5.0–8.0)

## 2020-01-27 LAB — LIPASE, BLOOD: Lipase: 19 U/L (ref 11–51)

## 2020-01-27 MED ORDER — SODIUM CHLORIDE 0.9% FLUSH: 3.0000 mL | Freq: Once | INTRAVENOUS | Status: DC

## 2020-01-27 NOTE — Discharge Instructions (Signed)
Please follow-up with your primary care doctor early next week for recheck of your blood pressure and further blood pressure medication management.  Please take your methocarbamol, Tylenol and ibuprofen for pain.

## 2020-01-27 NOTE — ED Triage Notes (Addendum)
C/o RLQ pain x 2 weeks.  Seen by PCP and took antibiotic with some improvement of pain.  Denies nausea, vomiting, and diarrhea.  States she took BP at home over the past 2 days and it has been elevated.  States PCP was scheduling [name] due to pain.  Denies pain at present.

## 2020-01-27 NOTE — ED Provider Notes (Signed)
[facility] EMERGENCY DEPARTMENT Provider Note   CSN: [id number] Arrival date & time: 01/27/20  1537     History Chief Complaint  Patient presents with  . Abdominal Pain  . Hypertension    Kelli Horn is a 64 y.o. female.  HPI  Patient is a 64 year old female with past medical history significant for morbid obesity, asthma, DM, HTN, acid reflux presented today with lower quadrant abdominal pain that has been ongoing for 2 weeks.  She states that it began abruptly when she was try to get out of bed and she felt a "straining ""pulling "sensation in her right lower abdomen.  She states that she has had this happen in the past several times.  However she states that this normally resolves on its own and extended now.  She states that her symptoms of been ongoing for 2 weeks and have been constant.  She denies any nausea, vomiting, diarrhea.  Denies any dysuria, urgency or frequency but does state that she has had several episodes of "dribbling urination ".  She states she is not had a kidney stone in the past however she states that she was told by her primary care doctor that there is a possibility of kidney stone.  She was scheduled to have an ultrasound of her kidneys done to rule out hydronephrosis/obstruction however she presented to emergency department for evaluation because of ongoing symptoms.  She is currently on antibiotics for a urinary tract infection however she states that she did not take them this morning because she was concerned because her blood pressure was high.  She states that she checks her blood pressure several many times a day.  She states she checks it in the morning when she first gets up and before she eats anything over the course of the day.  She states that the primary reason she came to the emergency department today is because her blood pressure was more elevated than it was post to be and she was concerned that there is a drug interaction  between her enalapril and her antibiotics.  Patient states that she had a appendectomy several years ago.  She has been scheduled for a OB/GYN appointment since she has had a little bit of vaginal spotting several weeks as well.    Past Medical History:  Diagnosis Date  . Acid reflux   . Asthma   . Diabetes mellitus without complication ([place])   . Hypertension     Patient Active Problem List   Diagnosis Date Noted  . Upper back pain 03/24/2016  . Left shoulder pain 03/24/2016  . Hypersomnia with sleep apnea 02/13/2016  . 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 Morbid obesity due to excess calories ([place]) 02/13/2016  . 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 Weakness of both legs 02/13/2016  . Angina syndrome, abdominal ([place]) 02/13/2016  . History of progressive weakness 01/10/2016  . Unilateral facial pain 01/10/2016  . Confusion 01/10/2016  . Gait instability 01/10/2016  . Chest pain 12/11/2015  . Hypertension 12/11/2015  . Asthma 12/11/2015  . Normocytic anemia 12/11/2015  . Leukocytosis 12/11/2015  . Prediabetes 12/11/2015  . Unstable angina ([place]) 12/11/2015  . Chest pain with moderate risk for cardiac etiology   . Dyslipidemia   . Pain in the chest     Past Surgical History:  Procedure Laterality Date  . APPENDECTOMY       OB History   No obstetric history on file.  Family History  Problem Relation Age of Onset  . Heart attack Father 78  . Breast cancer Maternal Aunt   . Lung cancer Mother   . Diabetes Other     Social History   Tobacco Use  . Smoking status: Never Smoker  Substance Use Topics  . Alcohol use: No  . Drug use: No    Home Medications Prior to Admission medications   Medication Sig Start Date End Date Taking? Authorizing Provider  acetaminophen (TYLENOL) 500 MG tablet Take 1,000 mg by mouth every 6 (six) hours as needed (pain).    [provider]  acetaZOLAMIDE (DIAMOX) 125 MG tablet Take  1 tablet (125 mg total) by mouth 3 (three) times daily. 02/13/16   Dohmeier, Asencion Partridge, MD  albuterol (PROVENTIL HFA;VENTOLIN HFA) 108 (90 Base) MCG/ACT inhaler Inhale 2 puffs into the lungs every 6 (six) hours as needed for wheezing or shortness of breath.    [provider]  ALPRAZolam Duanne Moron) 0.5 MG tablet Take 1 tablet (0.5 mg total) by mouth at bedtime as needed for anxiety. 02/13/16   Dohmeier, Asencion Partridge, MD  aspirin EC 81 MG tablet Take 81 mg by mouth daily.    [provider]  diazepam (VALIUM) 10 MG tablet Take one hour before your MRI and have a driver [id number]   Lilia Argue R, DO  Enalapril-Hydrochlorothiazide 5-12.5 MG tablet Take 1 tablet by mouth 2 (two) times daily.    [provider]  loratadine (CLARITIN) 10 MG tablet Take 10 mg by mouth daily.    [provider]  methocarbamol (ROBAXIN) 500 MG tablet Take 500 mg by mouth every 6 (six) hours as needed for muscle spasms.    [provider]  nitroGLYCERIN (NITROSTAT) 0.4 MG SL tablet Place 1 tablet (0.4 mg total) under the tongue every 5 (five) minutes as needed for chest pain. 12/11/15   Lavina Hamman, MD  Polyethyl Glycol-Propyl Glycol (SYSTANE ULTRA) 0.4-0.3 % SOLN Place 1 drop into both eyes 2 (two) times daily.    [provider]    Allergies    Patient has no known allergies.  Review of Systems   Review of Systems  Constitutional: Negative for chills and fever.  HENT: Negative for congestion.   Eyes: Negative for pain.  Respiratory: Negative for cough and shortness of breath.   Cardiovascular: Negative for chest pain and leg swelling.  Gastrointestinal: Positive for abdominal pain. Negative for abdominal distention, diarrhea, nausea and vomiting.  Genitourinary: Positive for difficulty urinating. Negative for dysuria.  Musculoskeletal: Negative for myalgias.  Skin: Negative for rash.  Neurological: Negative for dizziness and headaches.    Physical Exam Updated Vital  Signs BP (!) 166/79 (BP Location: Left Arm)   Pulse 65   Temp 97.9 F (36.6 C) (Oral)   Resp 16   Ht 5\' 10"  (1.778 m)   Wt 136.1 kg   LMP 12/09/2013   SpO2 98%   BMI 43.05 kg/m   Physical Exam Vitals and nursing note reviewed.  Constitutional:      General: She is not in acute distress.    Appearance: She is obese.     Comments: Morbidly obese 64 year old female appears somewhat older than stated age.  In no acute distress sitting comfortably on side of bed.  HENT:     Head: Normocephalic and atraumatic.     Nose: Nose normal.  Eyes:     General: No scleral icterus. Cardiovascular:     Rate and Rhythm: Normal rate  and regular rhythm.     Pulses: Normal pulses.     Heart sounds: Normal heart sounds.  Pulmonary:     Effort: Pulmonary effort is normal. No respiratory distress.     Breath sounds: No wheezing.  Abdominal:     Palpations: Abdomen is soft.     Tenderness: There is no abdominal tenderness. There is no right CVA tenderness, left CVA tenderness or guarding.     Comments: Obese nondistended, nontender abdomen.  There is no guarding, rigidity or rebound.  There is no CVA tenderness bilaterally.  Genitourinary:    Comments: deferred  Musculoskeletal:     Cervical back: Normal range of motion.     Right lower leg: No edema.     Left lower leg: No edema.  Skin:    General: Skin is warm and dry.     Capillary Refill: Capillary refill takes less than 2 seconds.  Neurological:     Mental Status: She is alert. Mental status is at baseline.  Psychiatric:        Mood and Affect: Mood normal.        Behavior: Behavior normal.     ED Results / Procedures / Treatments   Labs (all labs ordered are listed, but only abnormal results are displayed) Labs Reviewed  COMPREHENSIVE METABOLIC PANEL - Abnormal; Notable for the following components:      Result Value   Glucose, Bld 105 (*)    All other components within normal limits  CBC - Abnormal; Notable for the following  components:   RBC 5.24 (*)    All other components within normal limits  URINALYSIS, ROUTINE W REFLEX MICROSCOPIC - Abnormal; Notable for the following components:   Color, Urine STRAW (*)    All other components within normal limits  LIPASE, BLOOD    EKG None  Radiology No results found.  Procedures Procedures (including critical care time)  Medications Ordered in ED Medications  sodium chloride flush (NS) 0.9 % injection 3 mL (has no administration in time range)    ED Course  I have reviewed the triage vital signs and the nursing notes.  Pertinent labs & imaging results that were available during my care of the patient were reviewed by me and considered in my medical decision making (see chart for details).    MDM Rules/Calculators/A&P                      Patient is well-appearing 64 year old female in no acute distress and currently asymptomatic presented today for intermittent right lower quadrant/right flank pain that has been ongoing for.  She was seen by her primary care doctor for this and ultrasound was ordered to evaluate for hydronephrosis as there was some concern for kidney stone.  She was having some urinary dribbling intermittently at that time.  She has no history of kidney stones.  She states that her symptoms completely resolved however this morning she notes her blood pressure was elevated because she was on an antibiotic per her PCP she was concerned over her medication interaction and presented to emergency department for evaluation for of elevated blood pressure.  She incidentally mention her abdominal pain and therefore in triage had lab work obtained.  Physical exam is unremarkable.  Is severely obese but is nontender with deep palpation of abdomen.  She states she feels well this time.  She has no lightheadedness or dizziness no episodes of syncope.  Doubt AAA, doubt appendicitis as she has  already had her appendix removed years ago.  Doubt ovarian torsion  as her pain seems to be mild and more problematic with movements.  I suspect this is a muscular injury.  She has been using Robaxin at home which she states helps with her pain as well as damp, warm heating pad.   Her blood pressure is not significantly elevated it is 166/79.  She denies any chest pain or shortness of breath headache lightheadedness or dizziness.  She has not had any prolonged episodes epistaxis.  She also has no evidence of kidney damage on her CMP.  Extensive patient education discussion over asymptomatic hypertension.  She states she has had elevated blood pressure for 12 years.  She understands need to follow-up with her primary care doctor for further evaluation of this and further medication management.  She is comfortable with discharge at this time.  Her urinalysis is without any abnormality.  Her CMP is unremarkable CBC is without leukocytosis or anemia.  Lipase is within normal limits.  She will follow-up with her primary care doctor on Tuesday.  She was given return precautions.  Final Clinical Impression(s) / ED Diagnoses Final diagnoses:  Essential hypertension  Right lower quadrant abdominal pain    Rx / DC Orders ED Discharge Orders    None       Tedd Sias, [place] 01/27/20 1945    Virgel Manifold, MD 01/28/20 [phone]

## 2020-02-07 ENCOUNTER — Other Ambulatory Visit: Payer: Self-pay | Admitting: Family Medicine

## 2020-02-07 DIAGNOSIS — R1900 Intra-abdominal and pelvic swelling, mass and lump, unspecified site: Secondary | ICD-10-CM

## 2020-02-21 ENCOUNTER — Ambulatory Visit
Admission: RE | Admit: 2020-02-21 | Discharge: 2020-02-21 | Disposition: A | Discharge status: Home / Self Care | Payer: 59 | Source: Ambulatory Visit | Attending: Family Medicine | Admitting: Family Medicine

## 2020-02-21 ENCOUNTER — Other Ambulatory Visit: Payer: Self-pay | Admitting: Family Medicine

## 2020-02-21 ENCOUNTER — Other Ambulatory Visit: Payer: Self-pay | Admitting: Surgery

## 2020-02-21 DIAGNOSIS — R0602 Shortness of breath: Secondary | ICD-10-CM

## 2020-02-23 ENCOUNTER — Other Ambulatory Visit: Payer: No Typology Code available for payment source

## 2020-04-01 ENCOUNTER — Other Ambulatory Visit: Payer: Self-pay | Admitting: Family Medicine

## 2020-04-01 ENCOUNTER — Other Ambulatory Visit: Payer: Self-pay

## 2020-04-01 DIAGNOSIS — Z1231 Encounter for screening mammogram for malignant neoplasm of breast: Secondary | ICD-10-CM

## 2020-04-05 ENCOUNTER — Other Ambulatory Visit: Payer: Self-pay

## 2020-04-05 ENCOUNTER — Ambulatory Visit
Admission: RE | Admit: 2020-04-05 | Discharge: 2020-04-05 | Disposition: A | Discharge status: Home / Self Care | Payer: 59 | Source: Ambulatory Visit | Attending: Family Medicine | Admitting: Family Medicine

## 2020-04-05 DIAGNOSIS — R1900 Intra-abdominal and pelvic swelling, mass and lump, unspecified site: Secondary | ICD-10-CM

## 2020-04-05 MED ORDER — IOPAMIDOL (ISOVUE-300) INJECTION 61%
100.0000 mL | Freq: Once | INTRAVENOUS | Status: AC | PRN
  Administered 2020-04-05: 100 mL via INTRAVENOUS

## 2020-04-10 ENCOUNTER — Telehealth: Payer: Self-pay | Admitting: Cardiology

## 2020-04-10 ENCOUNTER — Ambulatory Visit
Admission: RE | Admit: 2020-04-10 | Discharge: 2020-04-10 | Disposition: A | Discharge status: Home / Self Care | Payer: 59 | Source: Ambulatory Visit | Attending: Family Medicine | Admitting: Family Medicine

## 2020-04-10 ENCOUNTER — Other Ambulatory Visit: Payer: Self-pay

## 2020-04-10 DIAGNOSIS — Z1231 Encounter for screening mammogram for malignant neoplasm of breast: Secondary | ICD-10-CM

## 2020-04-10 NOTE — Telephone Encounter (Signed)
Returned call to patient she stated she has been having chest tightness and sob since this past May.Stated she is getting worse.Hard to walk from room to room without being sob.Stated she went to ED in 12/2019 with elevated B/P 220/110 and right lower back pain.Stated nothing was done.Stated she recently saw PCP Dr.Elkins he ordered abd ct  8/6/21which revealed severe mixed calcified atherosclerosis.Stated she is worried she has a blockage in her heart.Stated she has appointment with GI Dr.on Wed 8/18.Stated appointment was scheduled with Dr.Jordan 05/29/20.Stated she don't think she can wait that long she feels so bad.Advised Dr.Jordan has had a cancellation for Tue 8/17.Appointment scheduled with Dr.Jordan 8/17 at 4:00 pm.Advised to bring a list of all medications.Records requested with Dr.Elkins office.

## 2020-04-10 NOTE — Telephone Encounter (Signed)
Kelli Horn called to schedule her New PT appt with Dr. [place] due to needing surgery and wanting him to perform it due to him being the surgeon for family members and being highly recommended. She has been scheduled for his first available NP appt on 05/29/20, but requested I send a message to see if there is anything that can be done for her to be worked in sooner. Please advise.

## 2020-04-12 ENCOUNTER — Other Ambulatory Visit: Payer: Self-pay | Admitting: Family Medicine

## 2020-04-12 DIAGNOSIS — R928 Other abnormal and inconclusive findings on diagnostic imaging of breast: Secondary | ICD-10-CM

## 2020-04-12 NOTE — H&P (View-Only) (Signed)
Cardiology Office Note   Date:  04/16/2020   ID:  Kelli Horn, DOB [birth year], MRN [id number]  PCP:  [name], MD  Cardiologist:   Jeb Schloemer [place], MD   Chief Complaint  Patient presents with  . Coronary Artery Disease  . Shortness of Breath      History of Present Illness: Kelli Horn is a 64 y.o. female who is seen at the request of Dr Kelli Horn for evaluation of chest tightness and SOB. She has a history of DM, HLD,  and HTN. She was seen in the past by Dr Kelli Horn in 2017. Myoview at that time was normal. Echo showed severe LVH with normal systolic function and normal valves. Ecg was normal.   She reports that she has been SOB for years. This has progressed recently and is now severe. CXR was normal. No improvement with albuterol- this just makes her feel jittery. She describes a severe burning discomfort in her right breast. She also has severe burning pain in her left anterior chest associated with nausea, SOB and sweating. This occurs with any activity including walking across the room. Initially this would improve with rest but now it may take over an hour to go away. Hasn't really tried sl Ntg. Feels very shaky. BP has been running high. She has been reluctant to take Crestor because she states it causes severe lower abdominal cramping and diarrhea. She reports Dr Kelli Horn noted a murmur.   Other studies of note include plain X rays dating back to 2017 that showed significant aortic atherosclerosis. She recently had Abdominal/pelvic CT on 04/05/20 showing severe bilateral common iliac stenosis. 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 There was medical renal disease. She has an [name] report apparently showing a 2.9 cm gallstone but no gallstone noted on CT.    Past Medical History:  Diagnosis Date  . Acid reflux   . Asthma   . Diabetes mellitus without complication ([place])   . Hyperlipidemia   . Hypertension     Past Surgical History:    Procedure Laterality Date  . APPENDECTOMY       Current Outpatient Medications  Medication Sig Dispense Refill  . aspirin EC 81 MG tablet Take 81 mg by mouth daily.    . enalapril (VASOTEC) 10 MG tablet Take by mouth.    . Enalapril-Hydrochlorothiazide 5-12.5 MG tablet Take 1 tablet by mouth 2 (two) times daily.    Marland Kitchen loratadine (CLARITIN) 10 MG tablet Take 10 mg by mouth daily.    . methocarbamol (ROBAXIN) 500 MG tablet Take 500 mg by mouth every 6 (six) hours as needed for muscle spasms.    . methocarbamol (ROBAXIN) 750 MG tablet Take 750-1,500 mg by mouth every 8 (eight) hours as needed.    . nitroGLYCERIN (NITROSTAT) 0.4 MG SL tablet Place 1 tablet (0.4 mg total) under the tongue every 5 (five) minutes as needed for chest pain. 30 tablet 0  . Polyethyl Glycol-Propyl Glycol (SYSTANE ULTRA) 0.4-0.3 % SOLN Place 1 drop into both eyes 2 (two) times daily.    Marland Kitchen albuterol (PROVENTIL HFA;VENTOLIN HFA) 108 (90 Base) MCG/ACT inhaler Inhale 2 puffs into the lungs every 6 (six) hours as needed for wheezing or shortness of breath. (Patient not taking: Reported on 04/16/2020)    . carvedilol (COREG) 12.5 MG tablet Take 1 tablet (12.5 mg total) by mouth 2 (two) times daily. 180 tablet 3  . carvedilol (COREG) 12.5 MG tablet Take 1 tablet (  12.5 mg total) by mouth 2 (two) times daily. 180 tablet 3  . diazepam (VALIUM) 10 MG tablet Take one hour before your MRI and have a driver (Patient not taking: Reported on 04/16/2020) 2 tablet 0  . hydrOXYzine (VISTARIL) 25 MG capsule SMARTSIG:2 Capsule(s) By Mouth Daily PRN    . rosuvastatin (CRESTOR) 5 MG tablet Take 5 mg once a week 30 tablet 6   No current facility-administered medications for this visit.    Allergies:   Patient has no known allergies.    Social History:  The patient  reports that she quit smoking about 16 years ago. Her smoking use included cigarettes. She has a 20.00 pack-year smoking history. She has never used smokeless tobacco. She reports  that she does not drink alcohol and does not use drugs.   Family History:  The patient's family history includes Aneurysm in her father and paternal uncle; Breast cancer in her maternal aunt; Diabetes in an other family member; Heart attack (age of onset: 83) in her father; Heart disease in her paternal uncle; Lung cancer in her mother.    ROS:  Please see the history of present illness.   Otherwise, review of systems are positive for patient notes intermittent episodes of lower abdominal cramping, diarrhea, and bright red bleeding. She is scheduled to see Dr Kelli Horn tomorrow. She had a recent abnormal mammogram and further study is planned. .   All other systems are reviewed and negative.    PHYSICAL EXAM: VS:  BP (!) 175/79   Pulse 89   Ht 5\' 10"  (1.778 m)   Wt 294 lb 3.2 oz (133.4 kg)   LMP 12/09/2013   SpO2 97%   BMI 42.21 kg/m  , BMI Body mass index is 42.21 kg/m. GEN: Well nourished, morbidly obese, in no acute distress  HEENT: normal  Neck: no JVD,  Left carotid and bilateral subclavian bruits.  Cardiac: RRR; no murmurs, rubs, or gallops,no edema. Femoral and pedal pulses are not palpable.  Respiratory:  clear to auscultation bilaterally, normal work of breathing GI: morbidly obese with large panus. Soft, nontender, nondistended, + BS. I don't hear renal bruits MS: no deformity or atrophy  Skin: warm and dry, no rash Neuro:  Strength and sensation are intact Psych: euthymic mood, full affect   EKG:  EKG is ordered today. The ekg ordered today demonstrates NSR rate 89. Normal.    Recent Labs: 01/27/2020: ALT 15; BUN 10; Creatinine, Ser 0.78; Hemoglobin 13.8; Platelets 389; Potassium 4.4; Sodium 140    Lipid Panel    Component Value Date/Time   CHOL 207 (H) 12/11/2015 0246   TRIG 102 12/11/2015 0246   HDL 34 (L) 12/11/2015 0246   CHOLHDL 6.1 12/11/2015 0246   VLDL 20 12/11/2015 0246   LDLCALC 153 (H) 12/11/2015 0246    Additional labs from Dr Kelli Horn: dated 02/07/20:  D dimer normal. BNP 54. Glucose 118. UA 7.4. otherwise CMET normal. CBC normal Hgb 14.7. TFTS normal. Cholesterol 207, triglycerides 137, HDL 40, LDL 142.  Urinalysis normal.  Wt Readings from Last 3 Encounters:  04/16/20 294 lb 3.2 oz (133.4 kg)  01/27/20 300 lb (136.1 kg)  05/05/16 300 lb (136.1 kg)      Other studies Reviewed: Additional studies/ records that were reviewed today include:   Myoview 12/26/15: Study Highlights   Nuclear stress EF: 62%.  There was no ST segment deviation noted during stress.  No T wave inversion was noted during stress.  The study is normal.  This is a low risk study.  The left ventricular ejection fraction is normal (55-65%).   Echo 12/11/15: Study Conclusions   - Left ventricle: The cavity size was normal. Wall thickness was  increased in a pattern of severe LVH. Systolic function was  normal. The estimated ejection fraction was in the range of 60%  to 65%. Doppler parameters are consistent with abnormal left  ventricular relaxation (grade 1 diastolic dysfunction). The E/e&'  ratio is between 8-15, suggesting indeterminate LV filling  pressure.  - Aortic valve: Trileaflet. Sclerosis without stenosis. There was  no regurgitation.  - Mitral valve: Calcified annulus. There was trivial regurgitation.  - Left atrium: The atrium was mildly dilated.  - Tricuspid valve: There was trivial regurgitation.  - Pulmonary arteries: PA peak pressure: 32 mm Hg (S).  - Inferior vena cava: The vessel was dilated. The respirophasic  diameter changes were blunted (< 50%), consistent with elevated  central venous pressure.    ASSESSMENT AND PLAN:  1.  Unstable angina. I feel her symptoms of chest pain and dyspnea are predominantly related to coronary ischemia. She has risk factors of HTN, HLD, DM and very strong family history of premature CAD. She is a former smoker. She has evidence of of diffuse vascular disease. I have recommended  proceeding directly to cardiac cath to evaluate her symptoms. Will continue ASA. Add Coreg 12.5 mg bid. Prn sl Ntg. If she as expected has significant CAD will need to consider options for treatment. I am reluctant to pursue stenting until we have further clarification of her bleeding risk especially from a  GI standpoint. Will discuss with Dr Kelli Horn. We need to know if it will be safe to use DAPT. The procedure and risks were reviewed including but not limited to death, myocardial infarction, stroke, arrythmias, bleeding, transfusion, emergency surgery, dye allergy, or renal dysfunction. The patient voices understanding and is agreeable to proceed. Also plan on Echo  2. HTN. Continue enalapril HCT. Add Coreg.  3. HLD. Recommend she at least start Crestor 5 mg weekly. I doubt her GI symptoms are related to statin. If truly statin intolerant will need to consider alternate therapy such as PCSK 9 inhibitor.  4. Morbid obesity.  5. PAD. Patient with left carotid and bilateral subclavian bruits. Severe bilateral common iliac stenosis noted on CT. Concern that some of her GI symptoms could be related to mesenteric ischemia. 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 Will initially proceed with cardiac cath but anticipate getting carotid, renal and LE dopplers. May need to consider LE revascularization as well. 74. Strong family history of premature vascular disease. 7. Former tobacco abuse.  8. Intermittent abdominal pain, cramping, diarrhea and bleeding. To see GI.  9. Abnormal mammogram.    Current medicines are reviewed at length with the patient today.  The patient does not have concerns regarding medicines.  The following changes have been made:  See above  Labs/ tests ordered today include:   Orders Placed This Encounter  Procedures  . Lipid panel  . Basic metabolic panel  . Hepatic function panel  . CBC w/Diff/Platelet  . PT and PTT  . EKG 12-Lead     Disposition:   FU with me post cardiac cath.  Cardiac cath scheduled for next Tuesday Aug 24.   Signed, Pastor Sgro [place], MD  04/16/2020 6:01 PM    [facility] [address] [place], [place], [address] Phone [phone], Fax [phone]

## 2020-04-12 NOTE — Progress Notes (Signed)
Cardiology Office Note   Date:  04/16/2020   ID:  Kelli Horn, DOB 1956-08-29, MRN [id number]  PCP:  [name], MD  Cardiologist:   Evertt Chouinard [place], MD   Chief Complaint  Patient presents with  . Coronary Artery Disease  . Shortness of Breath      History of Present Illness: Kelli Horn is a 64 y.o. female who is seen at the request of Dr Arelia Sneddon for evaluation of chest tightness and SOB. She has a history of DM, HLD,  and HTN. She was seen in the past by Dr Debara Pickett in 2017. Myoview at that time was normal. Echo showed severe LVH with normal systolic function and normal valves. Ecg was normal.   She reports that she has been SOB for years. This has progressed recently and is now severe. CXR was normal. No improvement with albuterol- this just makes her feel jittery. She describes a severe burning discomfort in her right breast. She also has severe burning pain in her left anterior chest associated with nausea, SOB and sweating. This occurs with any activity including walking across the room. Initially this would improve with rest but now it may take over an hour to go away. Hasn't really tried sl Ntg. Feels very shaky. BP has been running high. She has been reluctant to take Crestor because she states it causes severe lower abdominal cramping and diarrhea. She reports Dr Arelia Sneddon noted a murmur.   Other studies of note include plain X rays dating back to 2017 that showed significant aortic atherosclerosis. She recently had Abdominal/pelvic CT on 04/05/20 showing severe bilateral common iliac stenosis. 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 There was medical renal disease. She has an [name] report apparently showing a 2.9 cm gallstone but no gallstone noted on CT.    Past Medical History:  Diagnosis Date  . Acid reflux   . Asthma   . Diabetes mellitus without complication ([place])   . Hyperlipidemia   . Hypertension     Past Surgical History:    Procedure Laterality Date  . APPENDECTOMY       Current Outpatient Medications  Medication Sig Dispense Refill  . aspirin EC 81 MG tablet Take 81 mg by mouth daily.    . enalapril (VASOTEC) 10 MG tablet Take by mouth.    . Enalapril-Hydrochlorothiazide 5-12.5 MG tablet Take 1 tablet by mouth 2 (two) times daily.    Marland Kitchen loratadine (CLARITIN) 10 MG tablet Take 10 mg by mouth daily.    . methocarbamol (ROBAXIN) 500 MG tablet Take 500 mg by mouth every 6 (six) hours as needed for muscle spasms.    . methocarbamol (ROBAXIN) 750 MG tablet Take 750-1,500 mg by mouth every 8 (eight) hours as needed.    . nitroGLYCERIN (NITROSTAT) 0.4 MG SL tablet Place 1 tablet (0.4 mg total) under the tongue every 5 (five) minutes as needed for chest pain. 30 tablet 0  . Polyethyl Glycol-Propyl Glycol (SYSTANE ULTRA) 0.4-0.3 % SOLN Place 1 drop into both eyes 2 (two) times daily.    Marland Kitchen albuterol (PROVENTIL HFA;VENTOLIN HFA) 108 (90 Base) MCG/ACT inhaler Inhale 2 puffs into the lungs every 6 (six) hours as needed for wheezing or shortness of breath. (Patient not taking: Reported on 04/16/2020)    . carvedilol (COREG) 12.5 MG tablet Take 1 tablet (12.5 mg total) by mouth 2 (two) times daily. 180 tablet 3  . carvedilol (COREG) 12.5 MG tablet Take 1 tablet (  12.5 mg total) by mouth 2 (two) times daily. 180 tablet 3  . diazepam (VALIUM) 10 MG tablet Take one hour before your MRI and have a driver (Patient not taking: Reported on 04/16/2020) 2 tablet 0  . hydrOXYzine (VISTARIL) 25 MG capsule SMARTSIG:2 Capsule(s) By Mouth Daily PRN    . rosuvastatin (CRESTOR) 5 MG tablet Take 5 mg once a week 30 tablet 6   No current facility-administered medications for this visit.    Allergies:   Patient has no known allergies.    Social History:  The patient  reports that she quit smoking about 16 years ago. Her smoking use included cigarettes. She has a 20.00 pack-year smoking history. She has never used smokeless tobacco. She reports  that she does not drink alcohol and does not use drugs.   Family History:  The patient's family history includes Aneurysm in her father and paternal uncle; Breast cancer in her maternal aunt; Diabetes in an other family member; Heart attack (age of onset: 42) in her father; Heart disease in her paternal uncle; Lung cancer in her mother.    ROS:  Please see the history of present illness.   Otherwise, review of systems are positive for patient notes intermittent episodes of lower abdominal cramping, diarrhea, and bright red bleeding. She is scheduled to see Dr Cristina Gong tomorrow. She had a recent abnormal mammogram and further study is planned. .   All other systems are reviewed and negative.    PHYSICAL EXAM: VS:  BP (!) 175/79   Pulse 89   Ht 5\' 10"  (1.778 m)   Wt 294 lb 3.2 oz (133.4 kg)   LMP 12/09/2013   SpO2 97%   BMI 42.21 kg/m  , BMI Body mass index is 42.21 kg/m. GEN: Well nourished, morbidly obese, in no acute distress  HEENT: normal  Neck: no JVD,  Left carotid and bilateral subclavian bruits.  Cardiac: RRR; no murmurs, rubs, or gallops,no edema. Femoral and pedal pulses are not palpable.  Respiratory:  clear to auscultation bilaterally, normal work of breathing GI: morbidly obese with large panus. Soft, nontender, nondistended, + BS. I don't hear renal bruits MS: no deformity or atrophy  Skin: warm and dry, no rash Neuro:  Strength and sensation are intact Psych: euthymic mood, full affect   EKG:  EKG is ordered today. The ekg ordered today demonstrates NSR rate 89. Normal.    Recent Labs: 01/27/2020: ALT 15; BUN 10; Creatinine, Ser 0.78; Hemoglobin 13.8; Platelets 389; Potassium 4.4; Sodium 140    Lipid Panel    Component Value Date/Time   CHOL 207 (H) 12/11/2015 0246   TRIG 102 12/11/2015 0246   HDL 34 (L) 12/11/2015 0246   CHOLHDL 6.1 12/11/2015 0246   VLDL 20 12/11/2015 0246   LDLCALC 153 (H) 12/11/2015 0246    Additional labs from Dr Arelia Sneddon: dated 02/07/20:  D dimer normal. BNP 54. Glucose 118. UA 7.4. otherwise CMET normal. CBC normal Hgb 14.7. TFTS normal. Cholesterol 207, triglycerides 137, HDL 40, LDL 142.  Urinalysis normal.  Wt Readings from Last 3 Encounters:  04/16/20 294 lb 3.2 oz (133.4 kg)  01/27/20 300 lb (136.1 kg)  05/05/16 300 lb (136.1 kg)      Other studies Reviewed: Additional studies/ records that were reviewed today include:   Myoview 12/26/15: Study Highlights   Nuclear stress EF: 62%.  There was no ST segment deviation noted during stress.  No T wave inversion was noted during stress.  The study is normal.  This is a low risk study.  The left ventricular ejection fraction is normal (55-65%).   Echo 12/11/15: Study Conclusions   - Left ventricle: The cavity size was normal. Wall thickness was  increased in a pattern of severe LVH. Systolic function was  normal. The estimated ejection fraction was in the range of 60%  to 65%. Doppler parameters are consistent with abnormal left  ventricular relaxation (grade 1 diastolic dysfunction). The E/e&'  ratio is between 8-15, suggesting indeterminate LV filling  pressure.  - Aortic valve: Trileaflet. Sclerosis without stenosis. There was  no regurgitation.  - Mitral valve: Calcified annulus. There was trivial regurgitation.  - Left atrium: The atrium was mildly dilated.  - Tricuspid valve: There was trivial regurgitation.  - Pulmonary arteries: PA peak pressure: 32 mm Hg (S).  - Inferior vena cava: The vessel was dilated. The respirophasic  diameter changes were blunted (< 50%), consistent with elevated  central venous pressure.    ASSESSMENT AND PLAN:  1.  Unstable angina. I feel her symptoms of chest pain and dyspnea are predominantly related to coronary ischemia. She has risk factors of HTN, HLD, DM and very strong family history of premature CAD. She is a former smoker. She has evidence of of diffuse vascular disease. I have recommended  proceeding directly to cardiac cath to evaluate her symptoms. Will continue ASA. Add Coreg 12.5 mg bid. Prn sl Ntg. If she as expected has significant CAD will need to consider options for treatment. I am reluctant to pursue stenting until we have further clarification of her bleeding risk especially from a  GI standpoint. Will discuss with Dr Cristina Gong. We need to know if it will be safe to use DAPT. The procedure and risks were reviewed including but not limited to death, myocardial infarction, stroke, arrythmias, bleeding, transfusion, emergency surgery, dye allergy, or renal dysfunction. The patient voices understanding and is agreeable to proceed. Also plan on Echo  2. HTN. Continue enalapril HCT. Add Coreg.  3. HLD. Recommend she at least start Crestor 5 mg weekly. I doubt her GI symptoms are related to statin. If truly statin intolerant will need to consider alternate therapy such as PCSK 9 inhibitor.  4. Morbid obesity.  5. PAD. Patient with left carotid and bilateral subclavian bruits. Severe bilateral common iliac stenosis noted on CT. Concern that some of her GI symptoms could be related to mesenteric ischemia. 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 Will initially proceed with cardiac cath but anticipate getting carotid, renal and LE dopplers. May need to consider LE revascularization as well. 46. Strong family history of premature vascular disease. 7. Former tobacco abuse.  8. Intermittent abdominal pain, cramping, diarrhea and bleeding. To see GI.  9. Abnormal mammogram.    Current medicines are reviewed at length with the patient today.  The patient does not have concerns regarding medicines.  The following changes have been made:  See above  Labs/ tests ordered today include:   Orders Placed This Encounter  Procedures  . Lipid panel  . Basic metabolic panel  . Hepatic function panel  . CBC w/Diff/Platelet  . PT and PTT  . EKG 12-Lead     Disposition:   FU with me post cardiac cath.  Cardiac cath scheduled for next Tuesday Aug 24.   Signed, Judas Mohammad [place], MD  04/16/2020 6:01 PM    [facility] [address] [place], [place], [address] Phone [phone], Fax [phone]

## 2020-04-16 ENCOUNTER — Other Ambulatory Visit: Payer: Self-pay

## 2020-04-16 ENCOUNTER — Encounter: Payer: Self-pay | Admitting: Cardiology

## 2020-04-16 ENCOUNTER — Ambulatory Visit (INDEPENDENT_AMBULATORY_CARE_PROVIDER_SITE_OTHER): Payer: 59 | Admitting: Cardiology

## 2020-04-16 VITALS — BP 175/79 | HR 89 | Ht 70.0 in | Wt 294.2 lb

## 2020-04-16 DIAGNOSIS — R0989 Other specified symptoms and signs involving the circulatory and respiratory systems: Secondary | ICD-10-CM | POA: Diagnosis not present

## 2020-04-16 DIAGNOSIS — I10 Essential (primary) hypertension: Secondary | ICD-10-CM

## 2020-04-16 DIAGNOSIS — I200 Unstable angina: Secondary | ICD-10-CM | POA: Diagnosis not present

## 2020-04-16 DIAGNOSIS — E7800 Pure hypercholesterolemia, unspecified: Secondary | ICD-10-CM

## 2020-04-16 DIAGNOSIS — I739 Peripheral vascular disease, unspecified: Secondary | ICD-10-CM

## 2020-04-16 DIAGNOSIS — E118 Type 2 diabetes mellitus with unspecified complications: Secondary | ICD-10-CM

## 2020-04-16 MED ORDER — ROSUVASTATIN CALCIUM 5 MG PO TABS
ORAL_TABLET | ORAL | 6 refills | Status: DC
Start: 2020-04-16 — End: 2020-05-30

## 2020-04-16 MED ORDER — CARVEDILOL 12.5 MG PO TABS
12.5000 mg | ORAL_TABLET | Freq: Two times a day (BID) | ORAL | 3 refills | Status: DC
Start: 2020-04-16 — End: 2020-04-22

## 2020-04-16 MED ORDER — CARVEDILOL 12.5 MG PO TABS
12.5000 mg | ORAL_TABLET | Freq: Two times a day (BID) | ORAL | 3 refills | Status: DC
Start: 2020-04-16 — End: 2020-04-17

## 2020-04-16 NOTE — Patient Instructions (Addendum)
Medication Instructions: Start Carvedilol 12.5 mg twice a day Take Crestor 5 mg once a week Continue all other medications  Lab Work: Bmet,cbc,pt,lipid panel to be done Fri 8/20 Lab orders enclosed   Testing/Procedures: Cardiac Cath   Follow directions below   Follow-Up: At [facility], you and your health needs are our priority.  As part of our continuing mission to provide you with exceptional heart care, we have created designated Provider Care Teams.  These Care Teams include your primary Cardiologist (physician) and Advanced Practice Providers (APPs -  Physician Assistants and Nurse Practitioners) who all work together to provide you with the care you need, when you need it.  We recommend signing up for the patient portal called "MyChart".  Sign up information is provided on this After Visit Summary.  MyChart is used to connect with patients for Virtual Visits (Telemedicine).  Patients are able to view lab/test results, encounter notes, upcoming appointments, etc.  Non-urgent messages can be sent to your provider as well.   To learn more about what you can do with MyChart, go to [email or web address].    Your next appointment:  Monday 05/13/20 at 2:20 pm   The format for your next appointment: Office   Provider:  [facility] [place] [address] Dept: [phone] Loc: [phone]  Kelli Horn  04/16/2020  You are scheduled for a Cardiac Cath on Tuesday, 04/23/20 with Dr.Jordan.  1. Please arrive at the [facility] (Main Entrance A) at [facility]: [address] [place], [place] [address] at 7:00 am (This time is two hours before your procedure to ensure your preparation). Free valet parking service is available.   Special note: Every effort is made to have your procedure done on time. Please understand that emergencies sometimes delay  scheduled procedures.  2. Diet: Do not eat solid foods after midnight.  The patient may have clear liquids until 5am upon the day of the procedure.  3. Labs: You will need to have blood drawn on Friday 8/20 at [facility] Lab orders enclosed You do not need to be fasting.  Covid Test to be done Friday 8/20 at 12:50 pm at [facility] [employer] [employer] after test    4. Medication instructions in preparation for your procedure:    Hold Enalapril/HCTZ morning of cath    On the morning of your procedure, take your Aspirin 81 mg and any morning medicines NOT listed above.  You may use sips of water.  5. Plan for one night stay--bring personal belongings. 6. Bring a current list of your medications and current insurance cards. 7. You MUST have a responsible person to drive you home. 8. Someone MUST be with you the first 24 hours after you arrive home or your discharge will be delayed. 9. Please wear clothes that are easy to get on and off and wear slip-on shoes.  Thank you for allowing [name] to care for you!   -- [facility]

## 2020-04-17 ENCOUNTER — Other Ambulatory Visit: Payer: Self-pay

## 2020-04-17 ENCOUNTER — Telehealth: Payer: Self-pay | Admitting: Cardiology

## 2020-04-17 MED ORDER — METOPROLOL TARTRATE 50 MG PO TABS
50.0000 mg | ORAL_TABLET | Freq: Two times a day (BID) | ORAL | 3 refills | Status: DC
Start: 2020-04-17 — End: 2020-04-23

## 2020-04-17 NOTE — Progress Notes (Signed)
  Let's switch her to metoprolol tartrate 50 mg bid.  There is still an interaction listed, but this is less likely to decrease effectiveness of the albuterol  Pt made aware of the above recommendations from [place], PharmD. She verbalized understanding. Coreg discontinued and new Rx sent to [facility].

## 2020-04-17 NOTE — Telephone Encounter (Signed)
Pt c/o medication issue:  1. Name of Medication: carvedilol (COREG) 12.5 MG tablet  2. How are you currently taking this medication (dosage and times per day)? As directed   3. Are you having a reaction (difficulty breathing--STAT)? No   4. What is your medication issue? Patient is concerned the medication may interfere with albuterol (PROVENTIL HFA;VENTOLIN HFA) 108 (90 Base) MCG/ACT inhaler. She states she takes the albuterol about 4 times daily. Please advise.

## 2020-04-17 NOTE — Telephone Encounter (Signed)
Let's switch her to metoprolol tartrate 50 mg bid.  There is still an interaction listed, but this is less likely to decrease effectiveness of the albuterol

## 2020-04-19 ENCOUNTER — Other Ambulatory Visit (HOSPITAL_COMMUNITY)
Admission: RE | Admit: 2020-04-19 | Discharge: 2020-04-19 | Disposition: A | Discharge status: Home / Self Care | Payer: 59 | Source: Ambulatory Visit | Attending: Cardiology | Admitting: Cardiology

## 2020-04-19 DIAGNOSIS — Z20822 Contact with and (suspected) exposure to covid-19: Secondary | ICD-10-CM | POA: Diagnosis not present

## 2020-04-19 DIAGNOSIS — Z01812 Encounter for preprocedural laboratory examination: Secondary | ICD-10-CM | POA: Diagnosis present

## 2020-04-19 LAB — HEPATIC FUNCTION PANEL
ALT: 16 IU/L (ref 0–32)
AST: 16 IU/L (ref 0–40)
Albumin: 4.1 g/dL (ref 3.8–4.8)
Alkaline Phosphatase: 93 IU/L (ref 48–121)
Bilirubin Total: 0.3 mg/dL (ref 0.0–1.2)
Bilirubin, Direct: 0.09 mg/dL (ref 0.00–0.40)
Total Protein: 7.2 g/dL (ref 6.0–8.5)

## 2020-04-19 LAB — BASIC METABOLIC PANEL
BUN/Creatinine Ratio: 11 — ABNORMAL LOW (ref 12–28)
BUN: 10 mg/dL (ref 8–27)
CO2: 26 mmol/L (ref 20–29)
Calcium: 9.5 mg/dL (ref 8.7–10.3)
Chloride: 97 mmol/L (ref 96–106)
Creatinine, Ser: 0.91 mg/dL (ref 0.57–1.00)
GFR calc Af Amer: 78 mL/min/{1.73_m2} (ref >59)
GFR calc non Af Amer: 67 mL/min/{1.73_m2} (ref >59)
Glucose: 99 mg/dL (ref 65–99)
Potassium: 4.5 mmol/L (ref 3.5–5.2)
Sodium: 136 mmol/L (ref 134–144)

## 2020-04-19 LAB — LIPID PANEL
Chol/HDL Ratio: 5.6 ratio — ABNORMAL HIGH (ref 0.0–4.4)
Cholesterol, Total: 196 mg/dL (ref 100–199)
HDL: 35 mg/dL — ABNORMAL LOW (ref >39)
LDL Chol Calc (NIH): 133 mg/dL — ABNORMAL HIGH (ref 0–99)
Triglycerides: 154 mg/dL — ABNORMAL HIGH (ref 0–149)
VLDL Cholesterol Cal: 28 mg/dL (ref 5–40)

## 2020-04-19 LAB — CBC WITH DIFFERENTIAL/PLATELET
Basophils Absolute: 0.1 x10E3/uL (ref 0.0–0.2)
Basos: 1 %
EOS (ABSOLUTE): 0.4 x10E3/uL (ref 0.0–0.4)
Eos: 3 %
Hematocrit: 41.8 % (ref 34.0–46.6)
Hemoglobin: 13.7 g/dL (ref 11.1–15.9)
Immature Grans (Abs): 0.1 x10E3/uL (ref 0.0–0.1)
Immature Granulocytes: 1 %
Lymphocytes Absolute: 2.7 x10E3/uL (ref 0.7–3.1)
Lymphs: 24 %
MCH: 26.8 pg (ref 26.6–33.0)
MCHC: 32.8 g/dL (ref 31.5–35.7)
MCV: 82 fL (ref 79–97)
Monocytes Absolute: 1 x10E3/uL — ABNORMAL HIGH (ref 0.1–0.9)
Monocytes: 9 %
Neutrophils Absolute: 7.2 x10E3/uL — ABNORMAL HIGH (ref 1.4–7.0)
Neutrophils: 62 %
Platelets: 499 x10E3/uL — ABNORMAL HIGH (ref 150–450)
RBC: 5.11 x10E6/uL (ref 3.77–5.28)
RDW: 13.3 % (ref 11.7–15.4)
WBC: 11.4 x10E3/uL — ABNORMAL HIGH (ref 3.4–10.8)

## 2020-04-19 LAB — PT AND PTT
INR: 1 (ref 0.9–1.2)
Prothrombin Time: 10.7 s (ref 9.1–12.0)
aPTT: 29 s (ref 24–33)

## 2020-04-19 LAB — SARS CORONAVIRUS 2 (TAT 6-24 HRS): SARS Coronavirus 2: NEGATIVE

## 2020-04-22 ENCOUNTER — Other Ambulatory Visit: Payer: Self-pay | Admitting: Cardiology

## 2020-04-22 ENCOUNTER — Other Ambulatory Visit: Payer: Self-pay

## 2020-04-22 ENCOUNTER — Telehealth: Payer: Self-pay | Admitting: *Deleted

## 2020-04-22 DIAGNOSIS — I200 Unstable angina: Secondary | ICD-10-CM

## 2020-04-22 MED ORDER — SODIUM CHLORIDE 0.9% FLUSH: 3.0000 mL | Freq: Two times a day (BID) | INTRAVENOUS | Status: DC

## 2020-04-22 NOTE — Telephone Encounter (Signed)
Pt contacted pre-catheterization scheduled at [facility] for: Tuesday April 23, 2020 9 AM Verified arrival time and place: [facility]) at:7 AM   No solid food after midnight prior to cath, clear liquids until 5 AM day of procedure.  Pt states she is not currently taking enalapril-HCTZ or HCTZ.  AM meds can be  taken pre-cath with sips of water including: ASA 81 mg   Confirmed patient has responsible adult to drive home post procedure and observe 24 hours after arriving home: yes  You are allowed ONE visitor in the waiting room during your procedure. Both you and your visitor must wear a mask once you enter the hospital.       COVID-19 Pre-Screening Questions:  . In the past 14 days have you had a new cough, shortness of breath, headache, congestion, fever (100 or greater) unexplained body aches, new sore throat, or sudden loss of taste or sense of smell? no . In the past 14  days have you been around anyone with known Covid 19? no . Have you been vaccinated for COVID-19? no   Reviewed procedure/mask/visitor instructions, COVID-19 questions with patient.

## 2020-04-23 ENCOUNTER — Telehealth: Payer: Self-pay

## 2020-04-23 ENCOUNTER — Other Ambulatory Visit: Payer: 59

## 2020-04-23 ENCOUNTER — Ambulatory Visit (HOSPITAL_COMMUNITY)
Admission: RE | Admit: 2020-04-23 | Discharge: 2020-04-23 | Disposition: A | Discharge status: Home / Self Care | Payer: 59 | Attending: Cardiology | Admitting: Cardiology

## 2020-04-23 ENCOUNTER — Encounter (HOSPITAL_COMMUNITY): Payer: Self-pay | Admitting: Cardiology

## 2020-04-23 ENCOUNTER — Encounter (HOSPITAL_COMMUNITY)
Admission: RE | Disposition: A | Discharge status: Home / Self Care | Payer: Self-pay | Source: Home / Self Care | Attending: Cardiology

## 2020-04-23 ENCOUNTER — Other Ambulatory Visit: Payer: Self-pay

## 2020-04-23 DIAGNOSIS — I200 Unstable angina: Secondary | ICD-10-CM | POA: Diagnosis present

## 2020-04-23 DIAGNOSIS — J45909 Unspecified asthma, uncomplicated: Secondary | ICD-10-CM | POA: Diagnosis not present

## 2020-04-23 DIAGNOSIS — Z6841 Body Mass Index (BMI) 40.0 and over, adult: Secondary | ICD-10-CM | POA: Insufficient documentation

## 2020-04-23 DIAGNOSIS — E785 Hyperlipidemia, unspecified: Secondary | ICD-10-CM | POA: Insufficient documentation

## 2020-04-23 DIAGNOSIS — Z8249 Family history of ischemic heart disease and other diseases of the circulatory system: Secondary | ICD-10-CM | POA: Diagnosis not present

## 2020-04-23 DIAGNOSIS — I25110 Atherosclerotic heart disease of native coronary artery with unstable angina pectoris: Secondary | ICD-10-CM | POA: Diagnosis not present

## 2020-04-23 DIAGNOSIS — R928 Other abnormal and inconclusive findings on diagnostic imaging of breast: Secondary | ICD-10-CM | POA: Insufficient documentation

## 2020-04-23 DIAGNOSIS — I739 Peripheral vascular disease, unspecified: Secondary | ICD-10-CM | POA: Diagnosis present

## 2020-04-23 DIAGNOSIS — R0602 Shortness of breath: Secondary | ICD-10-CM

## 2020-04-23 DIAGNOSIS — Z87891 Personal history of nicotine dependence: Secondary | ICD-10-CM | POA: Insufficient documentation

## 2020-04-23 DIAGNOSIS — Z833 Family history of diabetes mellitus: Secondary | ICD-10-CM | POA: Insufficient documentation

## 2020-04-23 DIAGNOSIS — R0989 Other specified symptoms and signs involving the circulatory and respiratory systems: Secondary | ICD-10-CM

## 2020-04-23 DIAGNOSIS — Z7982 Long term (current) use of aspirin: Secondary | ICD-10-CM | POA: Diagnosis not present

## 2020-04-23 DIAGNOSIS — R7303 Prediabetes: Secondary | ICD-10-CM | POA: Diagnosis present

## 2020-04-23 DIAGNOSIS — K219 Gastro-esophageal reflux disease without esophagitis: Secondary | ICD-10-CM | POA: Insufficient documentation

## 2020-04-23 DIAGNOSIS — I10 Essential (primary) hypertension: Secondary | ICD-10-CM | POA: Diagnosis present

## 2020-04-23 DIAGNOSIS — E119 Type 2 diabetes mellitus without complications: Secondary | ICD-10-CM | POA: Diagnosis not present

## 2020-04-23 DIAGNOSIS — Z79899 Other long term (current) drug therapy: Secondary | ICD-10-CM | POA: Diagnosis not present

## 2020-04-23 DIAGNOSIS — R29898 Other symptoms and signs involving the musculoskeletal system: Secondary | ICD-10-CM

## 2020-04-23 HISTORY — PX: LEFT HEART CATH AND CORONARY ANGIOGRAPHY: CATH118249

## 2020-04-23 LAB — GLUCOSE, CAPILLARY: Glucose-Capillary: 100 mg/dL — ABNORMAL HIGH (ref 70–99)

## 2020-04-23 SURGERY — LEFT HEART CATH AND CORONARY ANGIOGRAPHY
Anesthesia: LOCAL

## 2020-04-23 MED ORDER — ACETAMINOPHEN 325 MG PO TABS: 650.0000 mg | ORAL_TABLET | ORAL | Status: DC | PRN

## 2020-04-23 MED ORDER — SODIUM CHLORIDE 0.9 % IV SOLN: 250.0000 mL | INTRAVENOUS | Status: DC | PRN

## 2020-04-23 MED ORDER — HEPARIN SODIUM (PORCINE) 1000 UNIT/ML IJ SOLN
INTRAMUSCULAR | Status: DC | PRN
  Administered 2020-04-23: 6000 [IU] via INTRAVENOUS

## 2020-04-23 MED ORDER — ONDANSETRON HCL 4 MG/2ML IJ SOLN: 4.0000 mg | Freq: Four times a day (QID) | INTRAMUSCULAR | Status: DC | PRN

## 2020-04-23 MED ORDER — HEPARIN (PORCINE) IN NACL 1000-0.9 UT/500ML-% IV SOLN
INTRAVENOUS | Status: AC
  Filled 2020-04-23: qty 500

## 2020-04-23 MED ORDER — FENTANYL CITRATE (PF) 100 MCG/2ML IJ SOLN
INTRAMUSCULAR | Status: DC | PRN
Start: 2020-04-23 — End: 2020-04-23
  Administered 2020-04-23: 25 ug via INTRAVENOUS

## 2020-04-23 MED ORDER — SODIUM CHLORIDE 0.9% FLUSH: 3.0000 mL | INTRAVENOUS | Status: DC | PRN

## 2020-04-23 MED ORDER — LIDOCAINE HCL (PF) 1 % IJ SOLN
INTRAMUSCULAR | Status: DC | PRN
  Administered 2020-04-23: 2 mL

## 2020-04-23 MED ORDER — HYDRALAZINE HCL 20 MG/ML IJ SOLN
INTRAMUSCULAR | Status: DC | PRN
  Administered 2020-04-23: 10 mg via INTRAVENOUS

## 2020-04-23 MED ORDER — HYDRALAZINE HCL 20 MG/ML IJ SOLN
INTRAMUSCULAR | Status: AC
  Filled 2020-04-23: qty 1

## 2020-04-23 MED ORDER — VERAPAMIL HCL 2.5 MG/ML IV SOLN
INTRAVENOUS | Status: DC | PRN
  Administered 2020-04-23: 10 mL via INTRA_ARTERIAL

## 2020-04-23 MED ORDER — SODIUM CHLORIDE 0.9 % WEIGHT BASED INFUSION: 1.0000 mL/kg/h | INTRAVENOUS | Status: DC

## 2020-04-23 MED ORDER — MIDAZOLAM HCL 2 MG/2ML IJ SOLN
INTRAMUSCULAR | Status: DC | PRN
  Administered 2020-04-23: 1 mg via INTRAVENOUS

## 2020-04-23 MED ORDER — SODIUM CHLORIDE 0.9 % WEIGHT BASED INFUSION
3.0000 mL/kg/h | INTRAVENOUS | Status: AC
  Administered 2020-04-23: 3 mL/kg/h via INTRAVENOUS

## 2020-04-23 MED ORDER — ASPIRIN 81 MG PO CHEW: 81.0000 mg | CHEWABLE_TABLET | ORAL | Status: DC

## 2020-04-23 MED ORDER — SODIUM CHLORIDE 0.9% FLUSH: 3.0000 mL | Freq: Two times a day (BID) | INTRAVENOUS | Status: DC

## 2020-04-23 MED ORDER — HYDRALAZINE HCL 20 MG/ML IJ SOLN: 10.0000 mg | INTRAMUSCULAR | Status: DC | PRN

## 2020-04-23 MED ORDER — HEPARIN (PORCINE) IN NACL 1000-0.9 UT/500ML-% IV SOLN
INTRAVENOUS | Status: DC | PRN
  Administered 2020-04-23 (×2): 500 mL

## 2020-04-23 MED ORDER — IOHEXOL 350 MG/ML SOLN
INTRAVENOUS | Status: DC | PRN
  Administered 2020-04-23: 65 mL

## 2020-04-23 MED ORDER — VERAPAMIL HCL 2.5 MG/ML IV SOLN
INTRAVENOUS | Status: AC
  Filled 2020-04-23: qty 2

## 2020-04-23 MED ORDER — SODIUM CHLORIDE 0.9 % WEIGHT BASED INFUSION: 1.0000 mL/kg/h | INTRAVENOUS | Status: AC

## 2020-04-23 MED ORDER — FENTANYL CITRATE (PF) 100 MCG/2ML IJ SOLN
INTRAMUSCULAR | Status: AC
  Filled 2020-04-23: qty 2

## 2020-04-23 MED ORDER — LIDOCAINE HCL (PF) 1 % IJ SOLN
INTRAMUSCULAR | Status: AC
  Filled 2020-04-23: qty 30

## 2020-04-23 MED ORDER — HEPARIN SODIUM (PORCINE) 1000 UNIT/ML IJ SOLN
INTRAMUSCULAR | Status: AC
  Filled 2020-04-23: qty 1

## 2020-04-23 MED ORDER — MIDAZOLAM HCL 2 MG/2ML IJ SOLN
INTRAMUSCULAR | Status: AC
  Filled 2020-04-23: qty 2

## 2020-04-23 MED ORDER — AMLODIPINE BESYLATE 5 MG PO TABS: 5.0000 mg | ORAL_TABLET | Freq: Every day | ORAL | 11 refills | Status: DC

## 2020-04-23 SURGICAL SUPPLY — 11 items
CATH 5FR JL3.5 JR4 ANG PIG MP (CATHETERS) ×1 IMPLANT
DEVICE RAD COMP TR BAND LRG (VASCULAR PRODUCTS) ×1 IMPLANT
GLIDESHEATH SLEND SS 6F .021 (SHEATH) ×1 IMPLANT
GUIDEWIRE INQWIRE 1.5J.035X260 (WIRE) IMPLANT
INQWIRE 1.5J .035X260CM (WIRE) ×2
KIT HEART LEFT (KITS) ×2 IMPLANT
PACK CARDIAC CATHETERIZATION (CUSTOM PROCEDURE TRAY) ×2 IMPLANT
SHEATH PROBE COVER 6X72 (BAG) ×1 IMPLANT
SYR MEDRAD MARK 7 150ML (SYRINGE) ×2 IMPLANT
TRANSDUCER W/STOPCOCK (MISCELLANEOUS) ×2 IMPLANT
TUBING CIL FLEX 10 FLL-RA (TUBING) ×2 IMPLANT

## 2020-04-23 NOTE — Progress Notes (Signed)
Patient and mother in law was given discharge instructions. Both verbalized understanding.

## 2020-04-23 NOTE — Telephone Encounter (Signed)
Spoke to patient she stated she is feeling good this afternoon after having cardiac cath this morning.Advised Dr.Jordan has ordered PFT's,Echo,Carotid doppler,Lower Ext Arterial dopplers.Advised I will place orders and a scheduler will call back tomorrow with appointments.

## 2020-04-23 NOTE — Addendum Note (Signed)
Addended by: Kathyrn Lass on: 04/23/2020 05:29 PM   Modules accepted: Orders

## 2020-04-23 NOTE — Discharge Instructions (Signed)
Drink plenty of fluid for 48 hours and keep wrist elevated at heart level for 24 hours  Radial Site Care   This sheet gives you information about how to care for yourself after your procedure. Your health care provider may also give you more specific instructions. If you have problems or questions, contact your health care provider. What can I expect after the procedure? After the procedure, it is common to have:  Bruising and tenderness at the catheter insertion area. Follow these instructions at home: Medicines  Take over-the-counter and prescription medicines only as told by your health care provider. Insertion site care 1. Follow instructions from your health care provider about how to take care of your insertion site. Make sure you: ? Wash your hands with soap and water before you change your bandage (dressing). If soap and water are not available, use hand sanitizer. ? remove your dressing as told by your health care provider. In 24 hours 2. Check your insertion site every day for signs of infection. Check for: ? Redness, swelling, or pain. ? Fluid or blood. ? Pus or a bad smell. ? Warmth. 3. Do not take baths, swim, or use a hot tub until your health care provider approves. 4. You may shower 24-48 hours after the procedure, or as directed by your health care provider. ? Remove the dressing and gently wash the site with plain soap and water. ? Pat the area dry with a clean towel. ? Do not rub the site. That could cause bleeding. 5. Do not apply powder or lotion to the site. Activity   1. For 24 hours after the procedure, or as directed by your health care provider: ? Do not flex or bend the affected arm. ? Do not push or pull heavy objects with the affected arm. ? Do not drive yourself home from the hospital or clinic. You may drive 24 hours after the procedure unless your health care provider tells you not to. ? Do not operate machinery or power tools. 2. Do not lift  anything that is heavier than 10 lb (4.5 kg), or the limit that you are told, until your health care provider says that it is safe. For 4 days 3. Ask your health care provider when it is okay to: ? Return to work or school. ? Resume usual physical activities or sports. ? Resume sexual activity. General instructions  If the catheter site starts to bleed, raise your arm and put firm pressure on the site. If the bleeding does not stop, get help right away. This is a medical emergency.  If you went home on the same day as your procedure, a responsible adult should be with you for the first 24 hours after you arrive home.  Keep all follow-up visits as told by your health care provider. This is important. Contact a health care provider if:  You have a fever.  You have redness, swelling, or yellow drainage around your insertion site. Get help right away if:  You have unusual pain at the radial site.  The catheter insertion area swells very fast.  The insertion area is bleeding, and the bleeding does not stop when you hold steady pressure on the area.  Your arm or hand becomes pale, cool, tingly, or numb. These symptoms may represent a serious problem that is an emergency. Do not wait to see if the symptoms will go away. Get medical help right away. Call your local emergency services (911 in the [place] Do not   drive yourself to the hospital. Summary  After the procedure, it is common to have bruising and tenderness at the site.  Follow instructions from your health care provider about how to take care of your radial site wound. Check the wound every day for signs of infection.  Do not lift anything that is heavier than 10 lb (4.5 kg), or the limit that you are told, until your health care provider says that it is safe. This information is not intended to replace advice given to you by your health care provider. Make sure you discuss any questions you have with your health care  provider. Document Revised: 09/22/2017 Document Reviewed: 09/22/2017 Elsevier Patient Education  2020 [employer]  

## 2020-04-23 NOTE — Interval H&P Note (Signed)
History and Physical Interval Note:  04/23/2020 8:17 AM  Kelli Horn  has presented today for surgery, with the diagnosis of chest tightness - sob.  The various methods of treatment have been discussed with the patient and family. After consideration of risks, benefits and other options for treatment, the patient has consented to  Procedure(s): LEFT HEART CATH AND CORONARY ANGIOGRAPHY (N/A) as a surgical intervention.  The patient's history has been reviewed, patient examined, no change in status, stable for surgery.  I have reviewed the patient's chart and labs.  Questions were answered to the patient's satisfaction.   Cath Lab Visit (complete for each Cath Lab visit)  Clinical Evaluation Leading to the Procedure:   ACS: Yes.    Non-ACS:    Anginal Classification: CCS IV  Anti-ischemic medical therapy: Minimal Therapy (1 class of medications)  Non-Invasive Test Results: No non-invasive testing performed  Prior CABG: No previous CABG        Kelli Horn [facility] 04/23/2020 8:17 AM

## 2020-04-23 NOTE — Telephone Encounter (Signed)
Notified patient her covid test results were negative. Patient had a Left Heart Cath performed today 8/24, and wanted to let Dr. Martinique know that she has a Diagnostic Mammogram with Ultrasound scheduled on Friday 8/27 and wanted to check with Dr. Martinique to make sure that it is okay for her to still have that done. Advised patient I would forward this message to Dr. Martinique for advice.

## 2020-04-24 ENCOUNTER — Telehealth: Payer: Self-pay | Admitting: Cardiology

## 2020-04-24 ENCOUNTER — Other Ambulatory Visit: Payer: Self-pay

## 2020-04-24 DIAGNOSIS — R0602 Shortness of breath: Secondary | ICD-10-CM

## 2020-04-24 NOTE — Telephone Encounter (Signed)
Kelli Horn is calling stating central scheduling is needing the pulmonary function test request faxed over in order to schedule. The fax number is [phone]. Please call [name] once it has been sent so she can call to schedule.

## 2020-04-24 NOTE — Telephone Encounter (Signed)
Left message for patient to call and schedule PFT's--Echo--carotid doppler and lower arterial dopplers ordered by Dr. Martinique

## 2020-04-25 ENCOUNTER — Telehealth: Payer: Self-pay | Admitting: Cardiology

## 2020-04-25 NOTE — Telephone Encounter (Signed)
Patient is calling to follow up regarding the PFT that Dr. Martinique ordered. She states that she called earlier in the week and was given Central Scheduling's contact, they advised they do not schedule this. Central Scheduling advised the patient to call radiology, but they advised they do not schedule this test either. The last contact she was given was for [facility] but she had to leave a voicemail and has yet to hear anything back. Patient wants to speak with Malachy Mood regarding the PFT to see if she can find out who schedules the test so it can be done before her appt with Dr. Martinique on 9/13.

## 2020-04-25 NOTE — Telephone Encounter (Signed)
Information provided to scheduler who will call patient now to arrange.

## 2020-04-26 ENCOUNTER — Ambulatory Visit
Admission: RE | Admit: 2020-04-26 | Discharge: 2020-04-26 | Disposition: A | Discharge status: Home / Self Care | Payer: 59 | Source: Ambulatory Visit | Attending: Family Medicine | Admitting: Family Medicine

## 2020-04-26 ENCOUNTER — Other Ambulatory Visit: Payer: Self-pay

## 2020-04-26 ENCOUNTER — Other Ambulatory Visit: Payer: Self-pay | Admitting: Family Medicine

## 2020-04-26 DIAGNOSIS — R928 Other abnormal and inconclusive findings on diagnostic imaging of breast: Secondary | ICD-10-CM

## 2020-04-29 ENCOUNTER — Other Ambulatory Visit (HOSPITAL_COMMUNITY): Payer: 59

## 2020-04-29 NOTE — Telephone Encounter (Signed)
Patient is calling to follow up regarding COVID screening. She states she had a COVID test on 04/19/20 and documentation provided to her states that it is not recommended to have another COVID test within 14 days. She would like to discuss whether or not she needs to reschedule COVID test and PFT. Please advise.

## 2020-04-29 NOTE — Telephone Encounter (Signed)
Spoke with patient regarding appointment for PFT scheduled Thursday 05/02/20 at 11:00 am at [facility]---arrival time is 10:30 am--1st floor admissions office.  COVID screening scheduled Monday 04/29/20 at 2:25 pm.  Patient voiced her understanding.Kelli Horn

## 2020-04-29 NOTE — Telephone Encounter (Signed)
Spoke with patent regarding COVID screening appointment for today 04/29/20---Respiratory therapy is reaching out to infectious disease to see if the COVID screening patient had on 04/19/20  Will be within the policy for the PFT she has scheduled for 05/02/20.  Told patient as soon as I hear from Respiratory Therapy I will call her.

## 2020-04-30 ENCOUNTER — Ambulatory Visit: Payer: 59 | Admitting: Internal Medicine

## 2020-04-30 ENCOUNTER — Other Ambulatory Visit (HOSPITAL_COMMUNITY)
Admission: RE | Admit: 2020-04-30 | Discharge: 2020-04-30 | Disposition: A | Discharge status: Home / Self Care | Payer: 59 | Source: Ambulatory Visit | Attending: Cardiology | Admitting: Cardiology

## 2020-04-30 DIAGNOSIS — Z20822 Contact with and (suspected) exposure to covid-19: Secondary | ICD-10-CM | POA: Diagnosis not present

## 2020-04-30 DIAGNOSIS — Z01812 Encounter for preprocedural laboratory examination: Secondary | ICD-10-CM | POA: Diagnosis present

## 2020-04-30 LAB — SARS CORONAVIRUS 2 (TAT 6-24 HRS): SARS Coronavirus 2: NEGATIVE

## 2020-04-30 NOTE — Telephone Encounter (Signed)
Patient scheduled today 04/30/20 for COVID screening (PFT scheduled 05/02/20).  Patient is aware she needs to proceed with COVID screening.

## 2020-05-01 DIAGNOSIS — C50919 Malignant neoplasm of unspecified site of unspecified female breast: Secondary | ICD-10-CM

## 2020-05-01 HISTORY — DX: Malignant neoplasm of unspecified site of unspecified female breast: C50.919

## 2020-05-01 NOTE — Telephone Encounter (Signed)
Spoke to patient she stated all test Dr.Jordan wanted are scheduled.Advised I will move up appointment with Dr.Jordan to 9/30 at 8:40 am.He will discuss test results at appointment.

## 2020-05-02 ENCOUNTER — Telehealth: Payer: Self-pay | Admitting: Cardiology

## 2020-05-02 ENCOUNTER — Other Ambulatory Visit: Payer: Self-pay

## 2020-05-02 ENCOUNTER — Telehealth: Payer: Self-pay | Admitting: Emergency Medicine

## 2020-05-02 ENCOUNTER — Ambulatory Visit (HOSPITAL_COMMUNITY)
Admission: RE | Admit: 2020-05-02 | Discharge: 2020-05-02 | Disposition: A | Discharge status: Home / Self Care | Payer: 59 | Source: Ambulatory Visit | Attending: Cardiology | Admitting: Cardiology

## 2020-05-02 DIAGNOSIS — R0602 Shortness of breath: Secondary | ICD-10-CM | POA: Insufficient documentation

## 2020-05-02 DIAGNOSIS — J988 Other specified respiratory disorders: Secondary | ICD-10-CM

## 2020-05-02 LAB — PULMONARY FUNCTION TEST
DL/VA % pred: 101 %
DL/VA: 4.07 ml/min/mmHg/L
DLCO unc % pred: 70 %
DLCO unc: 17.23 ml/min/mmHg
FEF 25-75 Post: 0.65 L/sec
FEF 25-75 Pre: 0.46 L/sec
FEF2575-%Change-Post: 42 %
FEF2575-%Pred-Post: 25 %
FEF2575-%Pred-Pre: 17 %
FEV1-%Change-Post: 8 %
FEV1-%Pred-Post: 44 %
FEV1-%Pred-Pre: 41 %
FEV1-Post: 1.37 L
FEV1-Pre: 1.27 L
FEV1FVC-%Change-Post: 10 %
FEV1FVC-%Pred-Pre: 64 %
FEV6-%Change-Post: 5 %
FEV6-%Pred-Post: 63 %
FEV6-%Pred-Pre: 59 %
FEV6-Post: 2.44 L
FEV6-Pre: 2.31 L
FEV6FVC-%Change-Post: 8 %
FEV6FVC-%Pred-Post: 102 %
FEV6FVC-%Pred-Pre: 94 %
FVC-%Change-Post: -2 %
FVC-%Pred-Post: 61 %
FVC-%Pred-Pre: 63 %
FVC-Post: 2.47 L
FVC-Pre: 2.53 L
Post FEV1/FVC ratio: 56 %
Post FEV6/FVC ratio: 99 %
Pre FEV1/FVC ratio: 50 %
Pre FEV6/FVC Ratio: 91 %

## 2020-05-02 MED ORDER — ALBUTEROL SULFATE (2.5 MG/3ML) 0.083% IN NEBU
2.5000 mg | INHALATION_SOLUTION | Freq: Once | RESPIRATORY_TRACT | Status: AC
  Administered 2020-05-02: 2.5 mg via RESPIRATORY_TRACT

## 2020-05-02 NOTE — Telephone Encounter (Signed)
Called [facility] to schedule urengt appointment---1st available is 05/31/20 at 3:30 pm with Dr. Lamonte Sakai.  Scheduler at [facility] states she will send a note to the clinical staff for review and someone will call us regarding a sooner appointment.

## 2020-05-02 NOTE — Telephone Encounter (Signed)
Hi Ashley,   Patient can be added to my schedule.  Thanks, Wille Glaser

## 2020-05-02 NOTE — Telephone Encounter (Signed)
Thank you!  Pt is scheduled to see JD on 05/17/20.  Nothing further needed at this time- will close encounter.

## 2020-05-02 NOTE — Telephone Encounter (Signed)
Pt has been referred as an urgent referral based on 05/02/20 PFT's.  Pt is tenatively scheduled with [facility] on 10/1 but Dr. Doug Sou office is requesting a sooner appt if possible.  No providers have available consult slots during the month of September- 10/1 is the soonest we can schedule patient.   Dr. Erin Fulling and Dr. Silas Flood, please advise if this patient's PFTs determine she is ok to wait until 10/1 for treatment, and if not please advise if patient can be scheduled in one of your held spots on schedule.  Thanks!

## 2020-05-10 ENCOUNTER — Other Ambulatory Visit: Payer: Self-pay | Admitting: Cardiology

## 2020-05-10 ENCOUNTER — Ambulatory Visit
Admission: RE | Admit: 2020-05-10 | Discharge: 2020-05-10 | Disposition: A | Discharge status: Home / Self Care | Payer: 59 | Source: Ambulatory Visit | Attending: Family Medicine | Admitting: Family Medicine

## 2020-05-10 ENCOUNTER — Other Ambulatory Visit: Payer: Self-pay | Admitting: Family Medicine

## 2020-05-10 ENCOUNTER — Other Ambulatory Visit: Payer: Self-pay

## 2020-05-10 DIAGNOSIS — R29898 Other symptoms and signs involving the musculoskeletal system: Secondary | ICD-10-CM

## 2020-05-10 DIAGNOSIS — R928 Other abnormal and inconclusive findings on diagnostic imaging of breast: Secondary | ICD-10-CM

## 2020-05-10 DIAGNOSIS — I739 Peripheral vascular disease, unspecified: Secondary | ICD-10-CM

## 2020-05-13 ENCOUNTER — Other Ambulatory Visit: Payer: Self-pay

## 2020-05-13 ENCOUNTER — Ambulatory Visit (HOSPITAL_COMMUNITY): Payer: 59 | Attending: Cardiology

## 2020-05-13 ENCOUNTER — Ambulatory Visit: Payer: 59 | Admitting: Cardiology

## 2020-05-13 DIAGNOSIS — R0602 Shortness of breath: Secondary | ICD-10-CM | POA: Diagnosis present

## 2020-05-13 DIAGNOSIS — I200 Unstable angina: Secondary | ICD-10-CM

## 2020-05-13 LAB — ECHOCARDIOGRAM COMPLETE
Area-P 1/2: 4.96 cm2
S' Lateral: 3 cm

## 2020-05-16 ENCOUNTER — Other Ambulatory Visit: Payer: Self-pay

## 2020-05-16 ENCOUNTER — Ambulatory Visit (HOSPITAL_COMMUNITY)
Admission: RE | Admit: 2020-05-16 | Discharge: 2020-05-16 | Disposition: A | Discharge status: Home / Self Care | Payer: 59 | Source: Ambulatory Visit | Attending: Internal Medicine | Admitting: Internal Medicine

## 2020-05-16 ENCOUNTER — Other Ambulatory Visit: Payer: Self-pay | Admitting: Cardiology

## 2020-05-16 ENCOUNTER — Ambulatory Visit (HOSPITAL_BASED_OUTPATIENT_CLINIC_OR_DEPARTMENT_OTHER)
Admission: RE | Admit: 2020-05-16 | Discharge: 2020-05-16 | Disposition: A | Discharge status: Home / Self Care | Payer: 59 | Source: Ambulatory Visit | Attending: Cardiology | Admitting: Cardiology

## 2020-05-16 DIAGNOSIS — I200 Unstable angina: Secondary | ICD-10-CM

## 2020-05-16 DIAGNOSIS — R29898 Other symptoms and signs involving the musculoskeletal system: Secondary | ICD-10-CM | POA: Diagnosis present

## 2020-05-16 DIAGNOSIS — R0602 Shortness of breath: Secondary | ICD-10-CM

## 2020-05-16 DIAGNOSIS — R0989 Other specified symptoms and signs involving the circulatory and respiratory systems: Secondary | ICD-10-CM

## 2020-05-16 DIAGNOSIS — I739 Peripheral vascular disease, unspecified: Secondary | ICD-10-CM | POA: Diagnosis present

## 2020-05-16 DIAGNOSIS — I6522 Occlusion and stenosis of left carotid artery: Secondary | ICD-10-CM

## 2020-05-17 ENCOUNTER — Other Ambulatory Visit: Payer: Self-pay

## 2020-05-17 ENCOUNTER — Encounter: Payer: Self-pay | Admitting: Pulmonary Disease

## 2020-05-17 ENCOUNTER — Ambulatory Visit (INDEPENDENT_AMBULATORY_CARE_PROVIDER_SITE_OTHER): Payer: 59 | Admitting: Pulmonary Disease

## 2020-05-17 ENCOUNTER — Encounter: Payer: Self-pay | Admitting: *Deleted

## 2020-05-17 VITALS — BP 140/90 | HR 102 | Temp 97.4°F | Ht 70.0 in | Wt 298.0 lb

## 2020-05-17 DIAGNOSIS — J449 Chronic obstructive pulmonary disease, unspecified: Secondary | ICD-10-CM | POA: Diagnosis not present

## 2020-05-17 DIAGNOSIS — R0683 Snoring: Secondary | ICD-10-CM

## 2020-05-17 DIAGNOSIS — D0512 Intraductal carcinoma in situ of left breast: Secondary | ICD-10-CM

## 2020-05-17 DIAGNOSIS — J4550 Severe persistent asthma, uncomplicated: Secondary | ICD-10-CM

## 2020-05-17 MED ORDER — TRELEGY ELLIPTA 100-62.5-25 MCG/INH IN AEPB: 1.0000 | INHALATION_SPRAY | Freq: Every day | RESPIRATORY_TRACT | 6 refills | Status: DC

## 2020-05-17 NOTE — Patient Instructions (Signed)
-   Start Trelegy Inhaler, 1 puff daily - Use albuterol inhaler as needed 2 puffs every 4-6 hours - We will call you about scheduling the home sleep study

## 2020-05-17 NOTE — Progress Notes (Addendum)
06/04/20 Addendum: Patient is scheduled for breast surgery later this month. From a pulmonary standpoint, she is low-moderate risk for post-operative respiratory complications given her history of severe obstructive defect on pulmonary function tests and active symptoms at time of visit. She has been prescribed trelegy ellipta to optimize her asthma with as needed albuterol. There is concern for underlying sleep apnea in which she will be undergoing workup in near future. Should there be concern for prolonged effects of anesthesia in the post-op recovery period, she would benefit from CPAP or BIPAP therapy during recovery.   Freda Jackson, MD [facility] Office: [phone]   See Amion for Pager Details        Synopsis: Referred by Peter Martinique, MD for obstructive lung disease  Subjective:   PATIENT ID: Kelli Horn GENDER: female DOB: 04/11/1956, MRN: [id number]   HPI  Chief Complaint  Patient presents with  . Follow-up    abnormal pft, hx of chronic bronchitis as a child, asthma as an adult 25-30 years.    Kelli Horn is a 64 year old woman, former smoker with asthma, diabetes mellitus type II, hypertension, hyperlipidemia, GERD and recent diagnosis of breast cancer this month who is referred to pulmonary clinic for evaluation of abnormal pulmonary function tests.  She was diagnosed with asthma in her 47s and reports history of recurrent episodes of bronchitis in childhood. She has been managed with as needed albuterol throughout her life. She does report being on Symbicort about 5 years ago with improvement in her breathing. She experiences shortness of breath, chest tightness and wheezing with exposure to strong perfumes/colognes, cleaning solutions, spring pollen, smoke and hot/humid weather. 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 She denies  cough or sputum production.   She reports snoring and daytime fatigue/sleepiness.   Past Medical History:  Diagnosis Date  . Acid reflux   . Asthma   . Breast cancer ([place])   . Diabetes mellitus without complication ([place])   . Hyperlipidemia   . Hypertension      Family History  Problem Relation Age of Onset  . Heart attack Father 62  . Aneurysm Father   . Breast cancer Maternal Aunt   . Lung cancer Mother   . Diabetes Other   . Heart disease Paternal Uncle   . Aneurysm Paternal Uncle      Social History   Socioeconomic History  . Marital status: Married    Spouse name: Not on file  . Number of children: 2  . Years of education: Not on file  . Highest education level: Not on file  Occupational History  . Occupation: retired   Tobacco Use  . Smoking status: Former Smoker    Packs/day: 2.00    Years: 10.00    Pack years: 20.00    Types: Cigarettes    Quit date: 04/16/2004    Years since quitting: 16.1  . Smokeless tobacco: Never Used  Substance and Sexual Activity  . Alcohol use: No  . Drug use: No  . Sexual activity: Not Currently  Other Topics Concern  . Not on file  Social History Narrative  . Not on file   Social Determinants of Health   Financial Resource Strain:   . Difficulty of Paying Living Expenses: Not on file  Food Insecurity:   . Worried About Charity fundraiser in the Last Year:  Not on file  . Ran Out of Food in the Last Year: Not on file  Transportation Needs:   . Lack of Transportation (Medical): Not on file  . Lack of Transportation (Non-Medical): Not on file  Physical Activity:   . Days of Exercise per Week: Not on file  . Minutes of Exercise per Session: Not on file  Stress:   . Feeling of Stress : Not on file  Social Connections:   . Frequency of Communication with Friends and Family: Not on file  . Frequency of Social Gatherings with Friends and Family: Not on file  . Attends Religious Services: Not on file  . Active Member of  Clubs or Organizations: Not on file  . Attends Archivist Meetings: Not on file  . Marital Status: Not on file  Intimate Partner Violence:   . Fear of Current or Ex-Partner: Not on file  . Emotionally Abused: Not on file  . Physically Abused: Not on file  . Sexually Abused: Not on file     Allergies  Allergen Reactions  . Carvedilol Shortness Of Breath    Reacts with albuterol, worsens asthma symptoms when taken together   . Peanut-Containing Drug Products     Asthma attack     Outpatient Medications Prior to Visit  Medication Sig Dispense Refill  . albuterol (PROVENTIL HFA;VENTOLIN HFA) 108 (90 Base) MCG/ACT inhaler Inhale 2 puffs into the lungs every 6 (six) hours as needed for wheezing or shortness of breath.     Marland Kitchen albuterol (PROVENTIL) (2.5 MG/3ML) 0.083% nebulizer solution Take 2.5 mg by nebulization every 6 (six) hours as needed for shortness of breath.    Marland Kitchen amLODipine (NORVASC) 5 MG tablet Take 1 tablet (5 mg total) by mouth daily. 30 tablet 11  . aspirin EC 81 MG tablet Take 81 mg by mouth daily.    Marland Kitchen b complex vitamins tablet Take 1 tablet by mouth daily as needed (vertigo).    . enalapril (VASOTEC) 10 MG tablet Take 5-15 mg by mouth 2 (two) times daily as needed (high bp).     . ibuprofen (ADVIL) 200 MG tablet Take 400 mg by mouth every 6 (six) hours as needed for headache or moderate pain.    . methocarbamol (ROBAXIN) 750 MG tablet Take 750 mg by mouth 2 (two) times daily as needed for muscle spasms.     . nitroGLYCERIN (NITROSTAT) 0.4 MG SL tablet Place 1 tablet (0.4 mg total) under the tongue every 5 (five) minutes as needed for chest pain. 30 tablet 0  . Polyethyl Glycol-Propyl Glycol (SYSTANE ULTRA) 0.4-0.3 % SOLN Place 1 drop into both eyes 3 (three) times daily as needed (dry eyes).     . Polyvinyl Alcohol-Povidone (REFRESH OP) Place 1 drop into both eyes 4 (four) times daily as needed (dry eyes).    . rosuvastatin (CRESTOR) 5 MG tablet Take 5 mg once a week  (Patient taking differently: Take 5 mg by mouth every Saturday. ) 30 tablet 6  . enalapril-hydrochlorothiazide (VASERETIC) 10-25 MG tablet Take 1 tablet by mouth daily.    . [employer] FIBER GUMMIES PO Take 2 tablets by mouth daily.    . hydrochlorothiazide (HYDRODIURIL) 25 MG tablet Take 12.5 mg by mouth daily as needed (if the enalapril-hctz is causing severe dry eye).     . hydrOXYzine (VISTARIL) 25 MG capsule Take 25 mg by mouth 2 (two) times daily as needed for itching.     . pantoprazole (PROTONIX) 40 MG tablet  Take 40 mg by mouth daily.     Facility-Administered Medications Prior to Visit  Medication Dose Route Frequency Provider Last Rate Last Admin  . sodium chloride flush (NS) 0.9 % injection 3 mL  3 mL Intravenous Q12H Martinique, Peter M, MD        Review of Systems  Constitutional: Positive for malaise/fatigue. Negative for chills, diaphoresis, fever and weight loss.  HENT: Negative for congestion, sinus pain and sore throat.   Eyes: Negative for blurred vision and pain.  Respiratory: Positive for shortness of breath and wheezing. Negative for cough, hemoptysis and sputum production.   Cardiovascular: Positive for claudication. Negative for chest pain, palpitations, leg swelling and PND.  Gastrointestinal: Positive for heartburn. Negative for abdominal pain, blood in stool, melena, nausea and vomiting.  Genitourinary: Negative for dysuria, frequency and hematuria.  Musculoskeletal: Negative for joint pain and myalgias.  Skin: Negative for itching and rash.  Neurological: Negative for dizziness, weakness and headaches.  Endo/Heme/Allergies: Does not bruise/bleed easily.  Psychiatric/Behavioral: Negative.     Objective:  Physical Exam Constitutional:      Appearance: Normal appearance.  HENT:     Head: Normocephalic and atraumatic.     Nose: Nose normal.     Mouth/Throat:     Mouth: Mucous membranes are moist.     Pharynx: Oropharynx is clear.  Eyes:     General: No scleral  icterus.    Conjunctiva/sclera: Conjunctivae normal.     Pupils: Pupils are equal, round, and reactive to light.  Cardiovascular:     Rate and Rhythm: Normal rate and regular rhythm.     Heart sounds: No murmur heard.   Pulmonary:     Effort: Pulmonary effort is normal.     Breath sounds: Decreased breath sounds present. No wheezing.  Abdominal:     General: Bowel sounds are normal. There is no distension.     Palpations: Abdomen is soft.     Tenderness: There is no abdominal tenderness.  Musculoskeletal:     Cervical back: Neck supple.     Right lower leg: No edema.     Left lower leg: No edema.  Lymphadenopathy:     Cervical: No cervical adenopathy.  Skin:    Findings: No rash.  Neurological:     General: No focal deficit present.     Mental Status: She is alert and oriented to person, place, and time. Mental status is at baseline.  Psychiatric:        Mood and Affect: Mood normal.        Behavior: Behavior normal.        Thought Content: Thought content normal.        Judgment: Judgment normal.      Vitals:   05/17/20 1616  BP: 140/90  Pulse: (!) 102  Temp: (!) 97.4 F (36.3 C)  TempSrc: Temporal  SpO2: 96%  Weight: 298 lb (135.2 kg)  Height: 5\' 10"  (1.778 m)    CBC    Component Value Date/Time   WBC 11.5 (H) 05/22/2020 1206   WBC 10.1 01/27/2020 1609   RBC 5.05 05/22/2020 1206   HGB 13.7 05/22/2020 1206   HGB 13.7 04/19/2020 1144   HCT 42.1 05/22/2020 1206   HCT 41.8 04/19/2020 1144   PLT 415 (H) 05/22/2020 1206   PLT 499 (H) 04/19/2020 1144   MCV 83.4 05/22/2020 1206   MCV 82 04/19/2020 1144   MCH 27.1 05/22/2020 1206   MCHC 32.5 05/22/2020 1206   RDW  13.9 05/22/2020 1206   RDW 13.3 04/19/2020 1144   LYMPHSABS 2.2 05/22/2020 1206   LYMPHSABS 2.7 04/19/2020 1144   MONOABS 1.0 05/22/2020 1206   EOSABS 0.3 05/22/2020 1206   EOSABS 0.4 04/19/2020 1144   BASOSABS 0.1 05/22/2020 1206   BASOSABS 0.1 04/19/2020 1144   BMP Latest Ref Rng & Units  05/22/2020 04/19/2020 01/27/2020  Glucose 70 - 99 mg/dL 115(H) 99 105(H)  BUN 8 - 23 mg/dL 12 10 10   Creatinine 0.44 - 1.00 mg/dL 0.82 0.91 0.78  BUN/Creat Ratio 12 - 28 - 11(L) -  Sodium 135 - 145 mmol/L 137 136 140  Potassium 3.5 - 5.1 mmol/L 4.2 4.5 4.4  Chloride 98 - 111 mmol/L 103 97 104  CO2 22 - 32 mmol/L 28 26 27   Calcium 8.9 - 10.3 mg/dL 9.5 9.5 9.2    Chest imaging: CXR 01/2020: no acute abnormalities noted.  PFT: FVC 61%, FEV1 44%, FEV1/FVC 56, 8% change in FEV1 post. No TLC. DLC 70%.  Labs: Reviewed as above.  Echo: EF 55-60%, LV has normal systolic function. RV systolic function is normal. Left atrial and right atrial size is mildly dilated.   Heart Catheterization: 04/23/20 1. Nonobstructive CAD 2. Normal LV function 3. Mildly elevated LVEDP 4. Hypertensive 5. No significant right innominate or subclavian pressure gradient.      Assessment & Plan:   Asthma with irreversible airway obstruction, severe persistent, uncomplicated (HCC)  Snoring - Plan: Home sleep test  Discussion: Kelli Horn is a 64 year old woman, former smoker with asthma, diabetes mellitus type II, hypertension, hyperlipidemia, GERD and recent diagnosis of breast cancer this month who is referred to pulmonary clinic for evaluation of abnormal pulmonary function tests.  She has severe obstructive defect and a mild diffusion defect on recent PFTs. She has severe persistent asthma with fixed airway obstruction. She will be started on Trelegy Ellipta 1 puff daily and albuterol inhaler as needed.   Given concern for underlying sleep apnea with risk factors of age, obesity and history of snoring we will order a home sleep study.   She is to follow up in 3 months  Freda Jackson, MD [facility] Office: [phone]    Current Outpatient Medications:  .  albuterol (PROVENTIL HFA;VENTOLIN HFA) 108 (90 Base) MCG/ACT inhaler, Inhale 2 puffs into the lungs every 6 (six)  hours as needed for wheezing or shortness of breath. , Disp: , Rfl:  .  albuterol (PROVENTIL) (2.5 MG/3ML) 0.083% nebulizer solution, Take 2.5 mg by nebulization every 6 (six) hours as needed for shortness of breath., Disp: , Rfl:  .  amLODipine (NORVASC) 5 MG tablet, Take 1 tablet (5 mg total) by mouth daily., Disp: 30 tablet, Rfl: 11 .  aspirin EC 81 MG tablet, Take 81 mg by mouth daily., Disp: , Rfl:  .  b complex vitamins tablet, Take 1 tablet by mouth daily as needed (vertigo)., Disp: , Rfl:  .  enalapril (VASOTEC) 10 MG tablet, Take 5-15 mg by mouth 2 (two) times daily as needed (high bp). , Disp: , Rfl:  .  ibuprofen (ADVIL) 200 MG tablet, Take 400 mg by mouth every 6 (six) hours as needed for headache or moderate pain., Disp: , Rfl:  .  methocarbamol (ROBAXIN) 750 MG tablet, Take 750 mg by mouth 2 (two) times daily as needed for muscle spasms. , Disp: , Rfl:  .  nitroGLYCERIN (NITROSTAT) 0.4 MG SL tablet, Place 1 tablet (0.4 mg total) under the  tongue every 5 (five) minutes as needed for chest pain., Disp: 30 tablet, Rfl: 0 .  Polyethyl Glycol-Propyl Glycol (SYSTANE ULTRA) 0.4-0.3 % SOLN, Place 1 drop into both eyes 3 (three) times daily as needed (dry eyes). , Disp: , Rfl:  .  Polyvinyl Alcohol-Povidone (REFRESH OP), Place 1 drop into both eyes 4 (four) times daily as needed (dry eyes)., Disp: , Rfl:  .  rosuvastatin (CRESTOR) 5 MG tablet, Take 5 mg once a week (Patient taking differently: Take 5 mg by mouth every Saturday. ), Disp: 30 tablet, Rfl: 6 .  [employer] FIBER GUMMIES PO, Take 2 tablets by mouth daily., Disp: , Rfl:  .  Fluticasone-Umeclidin-Vilant (TRELEGY ELLIPTA) 100-62.5-25 MCG/INH AEPB, Inhale 1 puff into the lungs daily., Disp: 1 each, Rfl: 6 .  hydrochlorothiazide (HYDRODIURIL) 25 MG tablet, Take 12.5 mg by mouth daily as needed (if the enalapril-hctz is causing severe dry eye). , Disp: , Rfl:  .  hydrOXYzine (VISTARIL) 25 MG capsule, Take 25 mg by mouth 2 (two) times daily as  needed for itching. , Disp: , Rfl:   Current Facility-Administered Medications:  .  sodium chloride flush (NS) 0.9 % injection 3 mL, 3 mL, Intravenous, Q12H, Martinique, Peter M, MD

## 2020-05-21 NOTE — Progress Notes (Signed)
Radiation Oncology         [phone] ________________________________  Initial Outpatient Consultation  Name: Kelli Horn MRN: [id number]  Date: 05/22/2020  DOB: [birth year]  HW:TUUEKC, Curt Jews, MD  [name], MD   REFERRING PHYSICIAN: [name], MD  DIAGNOSIS:    ICD-10-CM   1. Ductal carcinoma in situ (DCIS) of left breast  D05.12     Cancer Staging Ductal carcinoma in situ (DCIS) of left breast Staging form: Breast, AJCC 8th Edition - Clinical stage from 05/22/2020: Stage 0 (cTis (DCIS), cN0, cM0, ER+, PR+) - Unsigned   CHIEF COMPLAINT: Here to discuss management of left breast cancer  HISTORY OF PRESENT ILLNESS::Kelli Horn is a 64 y.o. female who presented with right breast abnormality on the following imaging: bilateral screening mammogram on the date of 04/10/2020. No symptoms were reported at that time. Ultrasounds of bilateral breasts on 04/26/2020 revealed a possible subtle distortion in the lateral right breast without a definite sonographic correlate. It also showed two adjacent abutting masses in the left breast at the 3:30 position that were indeterminate. There was no evidence of right or left axillary lymphadenopathy. Biopsy on date of 05/10/2020 showed carcinoma involving papillary lesion of the left breast - unclear if invasive or not. Differential diagnoses included invasive papillary carcinoma and solid papillary carcinoma. ER status: 100% positive; PR status 100% positive, Her2 status not obtained. Right breast was also biopsied and showed a complex sclerosing lesion with calcifications.  She reports that she has vascular disease and she states she wants to prioritize surgery for this ASAP.  She would prefer not to have surgical clips in her breast.    PREVIOUS RADIATION THERAPY: No  PAST MEDICAL HISTORY:  has a past medical history of Acid reflux, Asthma, Breast cancer ([place]), Diabetes mellitus without complication ([place]), Hyperlipidemia, and  Hypertension.    PAST SURGICAL HISTORY: Past Surgical History:  Procedure Laterality Date  . APPENDECTOMY    . LEFT HEART CATH AND CORONARY ANGIOGRAPHY N/A 04/23/2020   Procedure: LEFT HEART CATH AND CORONARY ANGIOGRAPHY;  Surgeon: Martinique, Peter M, MD;  Location: [facility];  Service: Cardiovascular;  Laterality: N/A;    FAMILY HISTORY: family history includes Aneurysm in her father and paternal uncle; Breast cancer in her maternal aunt; Diabetes in an other family member; Heart attack (age of onset: 60) in her father; Heart disease in her paternal uncle; Lung cancer in her mother.  SOCIAL HISTORY:  reports that she quit smoking about 16 years ago. Her smoking use included cigarettes. She has a 20.00 pack-year smoking history. She has never used smokeless tobacco. She reports that she does not drink alcohol and does not use drugs.  ALLERGIES: Carvedilol and Peanut-containing drug products  MEDICATIONS:  Current Outpatient Medications  Medication Sig Dispense Refill  . albuterol (PROVENTIL HFA;VENTOLIN HFA) 108 (90 Base) MCG/ACT inhaler Inhale 2 puffs into the lungs every 6 (six) hours as needed for wheezing or shortness of breath.     Marland Kitchen albuterol (PROVENTIL) (2.5 MG/3ML) 0.083% nebulizer solution Take 2.5 mg by nebulization every 6 (six) hours as needed for shortness of breath.    Marland Kitchen amLODipine (NORVASC) 5 MG tablet Take 1 tablet (5 mg total) by mouth daily. 30 tablet 11  . aspirin EC 81 MG tablet Take 81 mg by mouth daily.    Marland Kitchen b complex vitamins tablet Take 1 tablet by mouth daily as needed (vertigo).    . [employer] FIBER GUMMIES PO Take 2 tablets by  mouth daily.    . enalapril (VASOTEC) 10 MG tablet Take 5-15 mg by mouth 2 (two) times daily as needed (high bp).     . Fluticasone-Umeclidin-Vilant (TRELEGY ELLIPTA) 100-62.5-25 MCG/INH AEPB Inhale 1 puff into the lungs daily. 1 each 6  . hydrochlorothiazide (HYDRODIURIL) 25 MG tablet Take 12.5 mg by mouth daily as needed (if the  enalapril-hctz is causing severe dry eye).     . hydrOXYzine (VISTARIL) 25 MG capsule Take 25 mg by mouth 2 (two) times daily as needed for itching.     Marland Kitchen ibuprofen (ADVIL) 200 MG tablet Take 400 mg by mouth every 6 (six) hours as needed for headache or moderate pain.    . methocarbamol (ROBAXIN) 750 MG tablet Take 750 mg by mouth 2 (two) times daily as needed for muscle spasms.     . nitroGLYCERIN (NITROSTAT) 0.4 MG SL tablet Place 1 tablet (0.4 mg total) under the tongue every 5 (five) minutes as needed for chest pain. 30 tablet 0  . Polyethyl Glycol-Propyl Glycol (SYSTANE ULTRA) 0.4-0.3 % SOLN Place 1 drop into both eyes 3 (three) times daily as needed (dry eyes).     . Polyvinyl Alcohol-Povidone (REFRESH OP) Place 1 drop into both eyes 4 (four) times daily as needed (dry eyes).    . rosuvastatin (CRESTOR) 5 MG tablet Take 5 mg once a week (Patient taking differently: Take 5 mg by mouth every Saturday. ) 30 tablet 6   Current Facility-Administered Medications  Medication Dose Route Frequency Provider Last Rate Last Admin  . sodium chloride flush (NS) 0.9 % injection 3 mL  3 mL Intravenous Q12H Martinique, Peter M, MD        REVIEW OF SYSTEMS: As above.   PHYSICAL EXAM:  vitals were not taken for this visit.   General: Alert and oriented, in no acute distress HEENT: Head is normocephalic. Neurologic:  No obvious focalities. Speech is fluent. Coordination is intact. Psychiatric: Judgment and insight are intact. Affect is appropriate. Breasts: firmness associated with biopsy sites bilaterally . No other palpable masses appreciated in the breasts or axillae bilaterally.   ECOG = 1  0 - Asymptomatic (Fully active, able to carry on all predisease activities without restriction)  1 - Symptomatic but completely ambulatory (Restricted in physically strenuous activity but ambulatory and able to carry out work of a light or sedentary nature. For example, light housework, office work)  2 -  Symptomatic, <50% in bed during the day (Ambulatory and capable of all self care but unable to carry out any work activities. Up and about more than 50% of waking hours)  3 - Symptomatic, >50% in bed, but not bedbound (Capable of only limited self-care, confined to bed or chair 50% or more of waking hours)  4 - Bedbound (Completely disabled. Cannot carry on any self-care. Totally confined to bed or chair)  5 - Death   Eustace Pen MM, Creech RH, Tormey DC, et al. [phone]). "Toxicity and response criteria of the [facility]". [place] Oncol. 5 (6): 649-55   LABORATORY DATA:  Lab Results  Component Value Date   WBC 11.5 (H) 05/22/2020   HGB 13.7 05/22/2020   HCT 42.1 05/22/2020   MCV 83.4 05/22/2020   PLT 415 (H) 05/22/2020   CMP     Component Value Date/Time   NA 137 05/22/2020 1206   NA 136 04/19/2020 1144   K 4.2 05/22/2020 1206   CL 103 05/22/2020 1206   CO2 28 05/22/2020  1206   GLUCOSE 115 (H) 05/22/2020 1206   BUN 12 05/22/2020 1206   BUN 10 04/19/2020 1144   CREATININE 0.82 05/22/2020 1206   CALCIUM 9.5 05/22/2020 1206   PROT 7.7 05/22/2020 1206   PROT 7.2 04/19/2020 1144   ALBUMIN 3.5 05/22/2020 1206   ALBUMIN 4.1 04/19/2020 1144   AST 15 05/22/2020 1206   ALT 16 05/22/2020 1206   ALKPHOS 91 05/22/2020 1206   BILITOT 0.4 05/22/2020 1206   GFRNONAA >60 05/22/2020 1206   GFRAA >60 05/22/2020 1206         RADIOGRAPHY: CARDIAC CATHETERIZATION  Result Date: 04/23/2020  Mid RCA lesion is 25% stenosed.  Mid LAD lesion is 25% stenosed.  The left ventricular systolic function is normal.  LV end diastolic pressure is mildly elevated.  The left ventricular ejection fraction is greater than 65% by visual estimate.  1. Nonobstructive CAD 2. Normal LV function 3. Mildly elevated LVEDP 4. Hypertensive 5. No significant right innominate or subclavian pressure gradient. Plan: will optimize BP control. Will arrange PFTs, Echo, Carotid and LE arterial  dopplers. She is intolerant of beta blockers. Will initiate amlodipine in addition to enalapril for BP control.   US BREAST LTD UNI LEFT INC AXILLA  Result Date: 04/26/2020 CLINICAL DATA:  Screening recall for a possible breast asymmetry and a possible left breast mass or masses. EXAM: DIGITAL DIAGNOSTIC BILATERAL MAMMOGRAM WITH TOMO AND CAD; ULTRASOUND RIGHT BREAST LIMITED; ULTRASOUND LEFT BREAST LIMITED COMPARISON:  Previous exam(s). ACR Breast Density Category b: There are scattered areas of fibroglandular density. FINDINGS: Spot compression tomosynthesis images through the lateral posterior right breast demonstrates a persistent irregular asymmetry, which on the CC view has a subtle appearance of possible distortion. Spot compression tomosynthesis images of the lateral posterior left breast demonstrates clustered oval circumscribed masses together measuring approximately 2.2 cm. Mammographic images were processed with CAD. Ultrasound targeted to the you lateral aspect of the right breast demonstrates scattered areas of shadowing without a definite correlate for the subtle appearance of distortion on the mammogram. No abnormal lymph nodes are identified in the right axilla. Ultrasound targeted to the left breast at 3:30, 7 cm from the nipple demonstrates a cluster of hypoechoic masses. One of these is an irregular mass measuring 9 x 3 x 5 mm. An adjacent abutting mass measures 4 x 3 x 4 mm. Normal lymph nodes are identified in the left axilla. IMPRESSION: 1. There is a possible subtle distortion in the lateral right breast without a definite sonographic correlate. 2.  No evidence of right axillary lymphadenopathy. 3. There are 2 adjacent abutting masses in the left breast at 3:30 which are indeterminate. 4.  No evidence of left axillary lymphadenopathy. RECOMMENDATION: 1. Stereotactic biopsy is recommended for the possible subtle distortion in the lateral right breast. 2. Ultrasound-guided biopsy is  recommended for the 2 adjacent abutting masses in the left breast at 3:30. These may be sampled as 1 lesion together. These procedures have been scheduled for 05/10/2020 at 7:30 a.m. I have discussed the findings and recommendations with the patient. If applicable, a reminder letter will be sent to the patient regarding the next appointment. BI-RADS CATEGORY  4: Suspicious. Electronically Signed   By: Ammie Ferrier M.D.   On: 04/26/2020 16:24   US BREAST LTD UNI RIGHT INC AXILLA  Result Date: 04/26/2020 CLINICAL DATA:  Screening recall for a possible breast asymmetry and a possible left breast mass or masses. EXAM: DIGITAL DIAGNOSTIC BILATERAL MAMMOGRAM WITH TOMO AND CAD;  ULTRASOUND RIGHT BREAST LIMITED; ULTRASOUND LEFT BREAST LIMITED COMPARISON:  Previous exam(s). ACR Breast Density Category b: There are scattered areas of fibroglandular density. FINDINGS: Spot compression tomosynthesis images through the lateral posterior right breast demonstrates a persistent irregular asymmetry, which on the CC view has a subtle appearance of possible distortion. Spot compression tomosynthesis images of the lateral posterior left breast demonstrates clustered oval circumscribed masses together measuring approximately 2.2 cm. Mammographic images were processed with CAD. Ultrasound targeted to the you lateral aspect of the right breast demonstrates scattered areas of shadowing without a definite correlate for the subtle appearance of distortion on the mammogram. No abnormal lymph nodes are identified in the right axilla. Ultrasound targeted to the left breast at 3:30, 7 cm from the nipple demonstrates a cluster of hypoechoic masses. One of these is an irregular mass measuring 9 x 3 x 5 mm. An adjacent abutting mass measures 4 x 3 x 4 mm. Normal lymph nodes are identified in the left axilla. IMPRESSION: 1. There is a possible subtle distortion in the lateral right breast without a definite sonographic correlate. 2.  No  evidence of right axillary lymphadenopathy. 3. There are 2 adjacent abutting masses in the left breast at 3:30 which are indeterminate. 4.  No evidence of left axillary lymphadenopathy. RECOMMENDATION: 1. Stereotactic biopsy is recommended for the possible subtle distortion in the lateral right breast. 2. Ultrasound-guided biopsy is recommended for the 2 adjacent abutting masses in the left breast at 3:30. These may be sampled as 1 lesion together. These procedures have been scheduled for 05/10/2020 at 7:30 a.m. I have discussed the findings and recommendations with the patient. If applicable, a reminder letter will be sent to the patient regarding the next appointment. BI-RADS CATEGORY  4: Suspicious. Electronically Signed   By: Ammie Ferrier M.D.   On: 04/26/2020 16:24   MM DIAG BREAST TOMO BILATERAL  Result Date: 04/26/2020 CLINICAL DATA:  Screening recall for a possible breast asymmetry and a possible left breast mass or masses. EXAM: DIGITAL DIAGNOSTIC BILATERAL MAMMOGRAM WITH TOMO AND CAD; ULTRASOUND RIGHT BREAST LIMITED; ULTRASOUND LEFT BREAST LIMITED COMPARISON:  Previous exam(s). ACR Breast Density Category b: There are scattered areas of fibroglandular density. FINDINGS: Spot compression tomosynthesis images through the lateral posterior right breast demonstrates a persistent irregular asymmetry, which on the CC view has a subtle appearance of possible distortion. Spot compression tomosynthesis images of the lateral posterior left breast demonstrates clustered oval circumscribed masses together measuring approximately 2.2 cm. Mammographic images were processed with CAD. Ultrasound targeted to the you lateral aspect of the right breast demonstrates scattered areas of shadowing without a definite correlate for the subtle appearance of distortion on the mammogram. No abnormal lymph nodes are identified in the right axilla. Ultrasound targeted to the left breast at 3:30, 7 cm from the nipple  demonstrates a cluster of hypoechoic masses. One of these is an irregular mass measuring 9 x 3 x 5 mm. An adjacent abutting mass measures 4 x 3 x 4 mm. Normal lymph nodes are identified in the left axilla. IMPRESSION: 1. There is a possible subtle distortion in the lateral right breast without a definite sonographic correlate. 2.  No evidence of right axillary lymphadenopathy. 3. There are 2 adjacent abutting masses in the left breast at 3:30 which are indeterminate. 4.  No evidence of left axillary lymphadenopathy. RECOMMENDATION: 1. Stereotactic biopsy is recommended for the possible subtle distortion in the lateral right breast. 2. Ultrasound-guided biopsy is recommended for the 2 adjacent  abutting masses in the left breast at 3:30. These may be sampled as 1 lesion together. These procedures have been scheduled for 05/10/2020 at 7:30 a.m. I have discussed the findings and recommendations with the patient. If applicable, a reminder letter will be sent to the patient regarding the next appointment. BI-RADS CATEGORY  4: Suspicious. Electronically Signed   By: Ammie Ferrier M.D.   On: 04/26/2020 16:24   ECHOCARDIOGRAM COMPLETE  Result Date: 05/13/2020    ECHOCARDIOGRAM REPORT   Patient Name:   MEARL HAREWOOD Date of Exam: 05/13/2020 Medical Rec #:  [id number]      Height:       70.0 in Accession #:    [id number]     Weight:       291.0 lb Date of Birth:  12-30-1955       BSA:          2.448 m Patient Age:    42 years       BP:           144/49 mmHg Patient Gender: F              HR:           74 bpm. Exam Location:  [address] Procedure: 2D Echo, Cardiac Doppler and Color Doppler Indications:    Dyspnea  History:        Patient has prior history of Echocardiogram examinations, most                 recent 12/11/2015. PAD, Signs/Symptoms:Shortness of Breath and                 Chest Pain; Risk Factors:Hypertension, Diabetes, Dyslipidemia                 and Morbid obesity.  Sonographer:    Dustin Flock RDCS  Referring Phys: [id number] PETER M Martinique  Sonographer Comments: Patient is morbidly obese. Image acquisition challenging due to patient body habitus. IMPRESSIONS  1. Left ventricular ejection fraction, by estimation, is 55 to 60%. The left ventricle has normal function. The left ventricle has no regional wall motion abnormalities. There is mild concentric left ventricular hypertrophy. Left ventricular diastolic parameters are indeterminate. Elevated left ventricular end-diastolic pressure.  2. Right ventricular systolic function is normal. The right ventricular size is normal. Tricuspid regurgitation signal is inadequate for assessing PA pressure.  3. Left atrial size was mildly dilated.  4. Right atrial size was mildly dilated.  5. The mitral valve is normal in structure. Trivial mitral valve regurgitation. No evidence of mitral stenosis. Moderate mitral annular calcification.  6. The aortic valve is tricuspid. There is mild calcification of the aortic valve. There is mild thickening of the aortic valve. Aortic valve regurgitation is not visualized. Mild aortic valve sclerosis is present, with no evidence of aortic valve stenosis.  7. The inferior vena cava is normal in size with greater than 50% respiratory variability, suggesting right atrial pressure of 3 mmHg. Conclusion(s)/Recommendation(s): Otherwise normal echocardiogram, with minor abnormalities described in the report. FINDINGS  Left Ventricle: Left ventricular ejection fraction, by estimation, is 55 to 60%. The left ventricle has normal function. The left ventricle has no regional wall motion abnormalities. The left ventricular internal cavity size was normal in size. There is  mild concentric left ventricular hypertrophy. Left ventricular diastolic parameters are indeterminate. Elevated left ventricular end-diastolic pressure. The E/e' is 21. Right Ventricle: The right ventricular size is normal. Right vetricular wall thickness was not well  visualized. Right  ventricular systolic function is normal. Tricuspid regurgitation signal is inadequate for assessing PA pressure. Left Atrium: Left atrial size was mildly dilated. Right Atrium: Right atrial size was mildly dilated. Pericardium: Trivial pericardial effusion is present. Mitral Valve: The mitral valve is normal in structure. Moderate mitral annular calcification. Trivial mitral valve regurgitation. No evidence of mitral valve stenosis. Tricuspid Valve: The tricuspid valve is normal in structure. Tricuspid valve regurgitation is trivial. No evidence of tricuspid stenosis. Aortic Valve: The aortic valve is tricuspid. There is mild calcification of the aortic valve. There is mild thickening of the aortic valve. Aortic valve regurgitation is not visualized. Mild aortic valve sclerosis is present, with no evidence of aortic valve stenosis. Pulmonic Valve: The pulmonic valve was not well visualized. Pulmonic valve regurgitation is not visualized. No evidence of pulmonic stenosis. Aorta: The aortic root and ascending aorta are structurally normal, with no evidence of dilitation. Venous: The inferior vena cava is normal in size with greater than 50% respiratory variability, suggesting right atrial pressure of 3 mmHg. IAS/Shunts: The atrial septum is grossly normal.  LEFT VENTRICLE PLAX 2D LVIDd:         4.20 cm  Diastology LVIDs:         3.00 cm  LV e' medial:    6.09 cm/s LV PW:         1.20 cm  LV E/e' medial:  20.4 LV IVS:        1.30 cm  LV e' lateral:   5.87 cm/s LVOT diam:     2.00 cm  LV E/e' lateral: 21.1 LV SV:         93 LV SV Index:   38 LVOT Area:     3.14 cm  RIGHT VENTRICLE RV Basal diam:  3.50 cm RV S prime:     13.70 cm/s TAPSE (M-mode): 2.2 cm LEFT ATRIUM             Index       RIGHT ATRIUM           Index LA diam:        4.20 cm 1.72 cm/m  RA Area:     18.00 cm LA Vol (A2C):   49.0 ml 20.02 ml/m RA Volume:   53.10 ml  21.69 ml/m LA Vol (A4C):   72.1 ml 29.45 ml/m LA Biplane Vol: 63.0 ml 25.74 ml/m   AORTIC VALVE LVOT Vmax:   123.00 cm/s LVOT Vmean:  83.500 cm/s LVOT VTI:    0.296 m  AORTA Ao Root diam: 2.60 cm MITRAL VALVE MV Area (PHT): 4.96 cm     SHUNTS MV Decel Time: 153 msec     Systemic VTI:  0.30 m MV E velocity: 124.00 cm/s  Systemic Diam: 2.00 cm MV A velocity: 140.00 cm/s MV E/A ratio:  0.89 Buford Dresser MD Electronically signed by Buford Dresser MD Signature Date/Time: 05/13/2020/1:21:49 PM    Final    MM CLIP PLACEMENT LEFT  Result Date: 05/10/2020 CLINICAL DATA:  Status post ultrasound-guided LEFT breast biopsy and stereotactic-guided RIGHT breast biopsy. EXAM: DIAGNOSTIC BILATERAL MAMMOGRAM POST ULTRASOUND AND STEREOTACTIC BIOPSIES COMPARISON:  Previous exam(s). FINDINGS: Mammographic images were obtained following ultrasound guided biopsy of LEFT breast mass (abutting masses) and stereotactic guided biopsy of RIGHT breast asymmetry/possible distortion. The biopsy marking clip is in expected position at the site of biopsy in the LEFT breast. Biopsy marker clip is displaced approximately 2 cm inferior to the targeted asymmetry/distortion in the outer RIGHT  breast. IMPRESSION: 1. Appropriate positioning of the coil shaped biopsy marking clip at the site of biopsy in the outer LEFT breast. 2. Approximately 2 cm inferior displacement of the coil shaped biopsy marking clip in the RIGHT breast relative to the targeted asymmetry/possible distortion. Final Assessment: Post Procedure Mammograms for Marker Placement Electronically Signed   By: Franki Cabot M.D.   On: 05/10/2020 09:10   MM CLIP PLACEMENT RIGHT  Result Date: 05/10/2020 CLINICAL DATA:  Status post ultrasound-guided LEFT breast biopsy and stereotactic-guided RIGHT breast biopsy. EXAM: DIAGNOSTIC BILATERAL MAMMOGRAM POST ULTRASOUND AND STEREOTACTIC BIOPSIES COMPARISON:  Previous exam(s). FINDINGS: Mammographic images were obtained following ultrasound guided biopsy of LEFT breast mass (abutting masses) and stereotactic  guided biopsy of RIGHT breast asymmetry/possible distortion. The biopsy marking clip is in expected position at the site of biopsy in the LEFT breast. Biopsy marker clip is displaced approximately 2 cm inferior to the targeted asymmetry/distortion in the outer RIGHT breast. IMPRESSION: 1. Appropriate positioning of the coil shaped biopsy marking clip at the site of biopsy in the outer LEFT breast. 2. Approximately 2 cm inferior displacement of the coil shaped biopsy marking clip in the RIGHT breast relative to the targeted asymmetry/possible distortion. Final Assessment: Post Procedure Mammograms for Marker Placement Electronically Signed   By: Franki Cabot M.D.   On: 05/10/2020 09:10   [name] LT BREAST BX W LOC DEV 1ST LESION IMG BX SPEC US GUIDE  Addendum Date: 05/21/2020   ADDENDUM REPORT: 05/14/2020 13:49 ADDENDUM: Pathology revealed CARCINOMA INVOLVING PAPILLARY LESION of the LEFT breast, 3:30 o'clock. The differential diagnosis includes invasive papillary carcinoma and solid papillary carcinoma. This was found to be concordant by Dr. Franki Cabot. Pathology revealed COMPLEX SCLEROSING LESION WITH CALCIFICATIONS of the RIGHT breast, outer quadrant. This was found to be concordant by Dr. Franki Cabot, with excision recommended. Pathology results were discussed with the patient by telephone. The patient reported doing well after the biopsies with tenderness at the sites. Post biopsy instructions and care were reviewed and questions were answered. The patient was encouraged to call [facility] for any additional concerns. My direct phone number was provided. The patient was referred to [facility] on May 22, 2020. Pathology results reported by Terie Purser, RN on 05/14/2020. Electronically Signed   By: Franki Cabot M.D.   On: 05/14/2020 13:49   Result Date: 05/21/2020 CLINICAL DATA:  Patient with 2  abutting masses within the LEFT breast at the 3:30 o'clock axis presents today for ultrasound-guided core biopsy. EXAM: ULTRASOUND GUIDED LEFT BREAST CORE NEEDLE BIOPSY COMPARISON:  Previous exam(s). PROCEDURE: I met with the patient and we discussed the procedure of ultrasound-guided biopsy, including benefits and alternatives. We discussed the high likelihood of a successful procedure. We discussed the risks of the procedure, including infection, bleeding, tissue injury, clip migration, and inadequate sampling. Informed written consent was given. The usual time-out protocol was performed immediately prior to the procedure. Lesion quadrant: Lower outer quadrant Using sterile technique and 1% Lidocaine as local anesthetic, under direct ultrasound visualization, a 12 gauge spring-loaded device was used to perform biopsy of the 2 abutting masses in the LEFT breast at the 3:30 o'clock axis (samples placed in 1 formalin container) using a lateral approach. At the conclusion of the procedure coil shaped tissue marker clip was deployed into the biopsy cavity. Follow up 2 view mammogram was performed and dictated separately. IMPRESSION: Ultrasound guided biopsy of  the LEFT breast mass (2 abutting/contiguous masses) within the LEFT breast at the 3:30 o'clock axis. Samples placed in a single formalin container. No apparent complications. Patient is scheduled for stereotactic biopsy of RIGHT breast asymmetry/possible subtle distortion later today. Electronically Signed: By: Franki Cabot M.D. 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 The differential diagnosis includes invasive papillary carcinoma and solid papillary carcinoma. This was found to be concordant by Dr. Franki Cabot. Pathology revealed COMPLEX SCLEROSING  LESION WITH CALCIFICATIONS of the RIGHT breast, outer quadrant. This was found to be concordant by Dr. Franki Cabot, with excision recommended. Pathology results were discussed with the patient by telephone. The patient reported doing well after the biopsies with tenderness at the sites. Post biopsy instructions and care were reviewed and questions were answered. The patient was encouraged to call [facility] for any additional concerns. My direct phone number was provided. The patient was referred to [facility] on May 22, 2020. Pathology results reported by Terie Purser, RN on 05/14/2020. Electronically Signed   By: Franki Cabot M.D.   On: 05/14/2020 13:49   Result Date: 05/21/2020 CLINICAL DATA:  Patient with possible subtle architectural distortion in the outer RIGHT breast presents today for stereotactic biopsy. EXAM: RIGHT BREAST STEREOTACTIC CORE NEEDLE BIOPSY COMPARISON:  Previous exams. FINDINGS: The patient and I discussed the procedure of stereotactic-guided biopsy including benefits and alternatives. We discussed the high likelihood of a successful procedure. We discussed the risks of the procedure including infection, bleeding, tissue injury, clip migration, and inadequate sampling. Informed written consent was given. The usual time out protocol was performed immediately prior to the procedure. Using sterile technique and 1% Lidocaine as local anesthetic, under stereotactic guidance, a 9 gauge vacuum assisted device was used to perform core needle biopsy of asymmetry/possible subtle distortion in the lower outer quadrant of the RIGHT breast using a superior approach. Lesion quadrant: Lower outer quadrant At the conclusion of the procedure, coil shaped tissue marker clip was deployed into the biopsy cavity. Follow-up 2-view mammogram was performed and dictated separately. IMPRESSION:  Stereotactic-guided biopsy of RIGHT breast asymmetry/possible distortion in the outer RIGHT breast. No apparent complications. Electronically Signed: By: Franki Cabot M.D. On: 05/10/2020 09:00   VAS [name] LE ART SEG MULTI (Segm&LE Reynauds)  Result Date: 05/19/2020 LOWER EXTREMITY DOPPLER STUDY Indications: Peripheral artery disease. Patient reports claudication symptoms              that she describes as a weakness in both of her legs, she is unable              to walk very far without feeling like her legs will give out. It              takes less than walking from the parking lot up to this building to              kick in. She thinks the right hip is what bothers her the most. High Risk Factors: Hypertension, hyperlipidemia, past history of smoking,                    coronary artery disease. Other Factors: CT abdomen/pelvis performed on 04/05/20 showed severe calcific  atherosclerosis of the bilateral common iliac arteries.  Performing Technologist: Mariane Masters RVT  Examination Guidelines: A complete evaluation includes at minimum, Doppler waveform signals and systolic blood pressure reading at the level of bilateral brachial, anterior tibial, and posterior tibial arteries, when vessel segments are accessible. Bilateral testing is considered an integral part of a complete examination. Photoelectric Plethysmograph (PPG) waveforms and toe systolic pressure readings are included as required and additional duplex testing as needed. Limited examinations for reoccurring indications may be performed as noted.  ABI Findings: +---------+------------------+-----+----------+--------+ Right    Rt Pressure (mmHg)IndexWaveform  Comment  +---------+------------------+-----+----------+--------+ Brachial 192                                       +---------+------------------+-----+----------+--------+ CFA                             monophasic          +---------+------------------+-----+----------+--------+ Popliteal                       monophasic         +---------+------------------+-----+----------+--------+ ATA      90                0.47 monophasic         +---------+------------------+-----+----------+--------+ PTA      112               0.58 monophasic         +---------+------------------+-----+----------+--------+ PERO     108               0.56 monophasic         +---------+------------------+-----+----------+--------+ Great Toe107               0.56 Abnormal           +---------+------------------+-----+----------+--------+ +---------+------------------+-----+----------+-------+ Left     Lt Pressure (mmHg)IndexWaveform  Comment +---------+------------------+-----+----------+-------+ Brachial 181                                      +---------+------------------+-----+----------+-------+ CFA                             monophasic        +---------+------------------+-----+----------+-------+ Popliteal                       monophasic        +---------+------------------+-----+----------+-------+ ATA      96                0.50 monophasic        +---------+------------------+-----+----------+-------+ PTA      113               0.59 monophasic        +---------+------------------+-----+----------+-------+ PERO     102               0.53 monophasic        +---------+------------------+-----+----------+-------+ Great Toe87                0.45 Abnormal          +---------+------------------+-----+----------+-------+ +-------+-----------+-----------+------------+------------+ ABI/TBIToday's ABIToday's TBIPrevious ABIPrevious TBI +-------+-----------+-----------+------------+------------+ Right  0.58  0.56                                +-------+-----------+-----------+------------+------------+ Left   0.59       0.45                                 +-------+-----------+-----------+------------+------------+  Summary: Right: Resting right ankle-brachial index indicates moderate right lower extremity arterial disease. The right toe-brachial index is abnormal. Left: Resting left ankle-brachial index indicates moderate left lower extremity arterial disease. The left toe-brachial index is abnormal.  *See table(s) above for measurements and observations. See arterial duplex report.  Vascular consult recommended. Electronically signed by Kathlyn Sacramento MD on 05/19/2020 at 2:44:14 PM.    Final    VAS US CAROTID  Result Date: 05/19/2020 Carotid Arterial Duplex Study Indications:  Left carotid and bilateral subclaivan bruits. Risk Factors: Hypertension, hyperlipidemia, past history of smoking, PAD. Performing Technologist: Mariane Masters RVT  Examination Guidelines: A complete evaluation includes B-mode imaging, spectral Doppler, color Doppler, and power Doppler as needed of all accessible portions of each vessel. Bilateral testing is considered an integral part of a complete examination. Limited examinations for reoccurring indications may be performed as noted.  Right Carotid Findings: +----------+--------+--------+--------+------------------+--------+           PSV cm/sEDV cm/sStenosisPlaque DescriptionComments +----------+--------+--------+--------+------------------+--------+ CCA Prox  62      12                                         +----------+--------+--------+--------+------------------+--------+ CCA Distal76      13                                         +----------+--------+--------+--------+------------------+--------+ ICA Prox  79      25              heterogenous               +----------+--------+--------+--------+------------------+--------+ ICA Mid   111     34      1-39%                              +----------+--------+--------+--------+------------------+--------+ ICA Distal120     43                                          +----------+--------+--------+--------+------------------+--------+ ECA       170     23                                         +----------+--------+--------+--------+------------------+--------+ +----------+--------+-------+---------+-------------------+           PSV cm/sEDV cmsDescribe Arm Pressure (mmHG) +----------+--------+-------+---------+-------------------+ [id number]            Turbulent192                 +----------+--------+-------+---------+-------------------+ +---------+--------+-+--------+-+--------------+ VertebralPSV cm/s0EDV cm/s0Not identified +---------+--------+-+--------+-+--------------+  Left Carotid Findings: +----------+--------+--------+--------+------------------+--------+  PSV cm/sEDV cm/sStenosisPlaque DescriptionComments +----------+--------+--------+--------+------------------+--------+ CCA Prox  95      24                                         +----------+--------+--------+--------+------------------+--------+ CCA Distal68      19                                         +----------+--------+--------+--------+------------------+--------+ ICA Prox  91      33              heterogenous               +----------+--------+--------+--------+------------------+--------+ ICA Mid   141     53      40-59%                             +----------+--------+--------+--------+------------------+--------+ ICA Distal109     36                                         +----------+--------+--------+--------+------------------+--------+ ECA       158     18                                         +----------+--------+--------+--------+------------------+--------+ +----------+--------+--------+--------+-------------------+           PSV cm/sEDV cm/sDescribeArm Pressure (mmHG) +----------+--------+--------+--------+-------------------+ [id number]             Stenotic181                  +----------+--------+--------+--------+-------------------+ +---------+--------+--+--------+--+---------+ VertebralPSV cm/s29EDV cm/s10Antegrade +---------+--------+--+--------+--+---------+   Summary: Right Carotid: Velocities in the right ICA are consistent with a 1-39% stenosis. Left Carotid: Velocities in the left ICA are consistent with a 40-59% stenosis. Vertebrals:  Left vertebral artery demonstrates antegrade flow. Right vertebral              artery demonstrates no discernable flow. Subclavians: Left subclavian artery was stenotic. Right subclavian artery flow              was disturbed. *See table(s) above for measurements and observations. Suggest follow up study in 12 months. Electronically signed by Kathlyn Sacramento MD on 05/19/2020 at 2:46:50 PM.    Final    VAS [name] LOWER EXTREMITY ARTERIAL DUPLEX  Result Date: 05/19/2020 LOWER EXTREMITY ARTERIAL DUPLEX STUDY Indications: Peripheral artery disease. Patient reports claudication symptoms              that she describes as a weakness in both of her legs; she is unable              to walk very far without feeling like her legs will give out. It              takes less than walking from the parking lot up to this floor to              kick in. She thinks the right hip is what bothers her the most. High Risk Factors: Hypertension, hyperlipidemia, past history of smoking,  coronary artery disease. Other Factors: CT abdomen/pelvis on 04/05/20 showed severe bilateral common iliac                narrowing/disease.  Current ABI: Today's ABIs are 0.58 on the right and 0.59 on the left. Limitations: Technically challenging and limited imaging of the aorta-iliacs due              to patient morbid obesity, bowel gas, flash artifact and poor              visualization. Multiple technologists attempted. Performing Technologist: Mariane Masters RVT Supporting Technologist: Wilkie Aye RVT  Examination Guidelines: A complete evaluation  includes B-mode imaging, spectral Doppler, color Doppler, and power Doppler as needed of all accessible portions of each vessel. Bilateral testing is considered an integral part of a complete examination. Limited examinations for reoccurring indications may be performed as noted. Aorta: +---+-------+----------+----------+--------+--------+-----+    AP (cm)Trans (cm)PSV (cm/s)WaveformThrombusShape +---+-------+----------+----------+--------+--------+-----+ Mid                 91        biphasic              +---+-------+----------+----------+--------+--------+-----+   +-----------+--------+-----+--------+----------+--------+ RIGHT      PSV cm/sRatioStenosisWaveform  Comments +-----------+--------+-----+--------+----------+--------+ EIA Mid    74                   monophasic         +-----------+--------+-----+--------+----------+--------+ EIA Distal 84                   monophasic         +-----------+--------+-----+--------+----------+--------+ CFA Prox   84                   monophasic         +-----------+--------+-----+--------+----------+--------+ DFA        26                   monophasic         +-----------+--------+-----+--------+----------+--------+ SFA Prox   82                   monophasic         +-----------+--------+-----+--------+----------+--------+ SFA Mid    53                   monophasic         +-----------+--------+-----+--------+----------+--------+ SFA Distal 39                   monophasic         +-----------+--------+-----+--------+----------+--------+ POP Prox   27                   monophasic         +-----------+--------+-----+--------+----------+--------+ POP Distal 26                   monophasic         +-----------+--------+-----+--------+----------+--------+ TP Trunk   41                   monophasic         +-----------+--------+-----+--------+----------+--------+ ATA Distal 19                    monophasic         +-----------+--------+-----+--------+----------+--------+ PTA Distal 23                   monophasic         +-----------+--------+-----+--------+----------+--------+  PERO Distal24                   monophasic         +-----------+--------+-----+--------+----------+--------+  +-----------+--------+-----+--------+----------+--------+ LEFT       PSV cm/sRatioStenosisWaveform  Comments +-----------+--------+-----+--------+----------+--------+ EIA Mid    78                   monophasic         +-----------+--------+-----+--------+----------+--------+ EIA Distal 69                   monophasic         +-----------+--------+-----+--------+----------+--------+ CFA Prox   79                   monophasic         +-----------+--------+-----+--------+----------+--------+ DFA        34                   monophasic         +-----------+--------+-----+--------+----------+--------+ SFA Prox   74                   monophasic         +-----------+--------+-----+--------+----------+--------+ SFA Mid    58                   monophasic         +-----------+--------+-----+--------+----------+--------+ SFA Distal 49                   monophasic         +-----------+--------+-----+--------+----------+--------+ POP Prox   29                   monophasic         +-----------+--------+-----+--------+----------+--------+ POP Distal 48                   monophasic         +-----------+--------+-----+--------+----------+--------+ TP Trunk   45                   monophasic         +-----------+--------+-----+--------+----------+--------+ ATA Distal 23                   monophasic         +-----------+--------+-----+--------+----------+--------+ PTA Distal 27                   monophasic         +-----------+--------+-----+--------+----------+--------+ PERO Distal23                   monophasic          +-----------+--------+-----+--------+----------+--------+ Poor visualization of the abdomen. The bilateral common iliac arteries were not able to be visualized at all on today's study. Monophasic waveforms in the bilateral external iliac arteries suggest severe stenosis and/or occlusion of the common iliac arteries.  Summary:  Aorta-Iliacs: Technically challenging and limited evaluation. Monophasic waveforms in the bilateral external iliac arteries suggest severe stenosis and/or occlusion of the bilateral common iliac arteries. Unable to directly visualize the common iliac arteries due to patient body habitus and overlying bowel gas. Right: Near normal infrainguinal examination. No evidence of focal stenosis. Left: Near normal infrainguinal examination. No evidence of focal stenosis.  See table(s) above for measurements and observations. See arterial Doppler report. Vascular consult recommended. Electronically signed by Kathlyn Sacramento MD on 05/19/2020 at 2:43:29 PM.  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   The consensus is that she would be a good candidate for breast conservation. I talked to her about the option of a mastectomy and informed her that her expected overall survival would be equivalent between mastectomy and breast conservation, based upon randomized controlled data. She is enthusiastic about breast conservation.  Anticipate bilateral lumpectomies, and SLN bx on the left.    It was a pleasure meeting the patient today. We discussed the risks, benefits, and side effects of radiotherapy. I recommend radiotherapy to the left breast to reduce her risk of locoregional recurrence by ~2/3.  We discussed that radiation would take approximately 4 weeks to complete and that I would give the patient a few weeks to heal following surgery before starting treatment planning.  If chemotherapy were to be given, this would precede radiotherapy. We  spoke about acute effects including skin irritation and fatigue as well as much less common late effects including internal organ injury or irritation. We spoke about the latest technology that is used to minimize the risk of late effects for patients undergoing radiotherapy to the breast or chest wall. No guarantees of treatment were given.  I let Dr Marlou Starks know that she prefers to avoid surgical clips.  I look forward to participating in the patient's care.  I will await her referral back to me for postoperative follow-up and eventual CT simulation/treatment planning.  On date of service, in total, I spent 45 minutes on this encounter. Patient was seen in person.   __________________________________________   Eppie Gibson, MD  This document serves as a record of services personally performed by Eppie Gibson, MD. It was created on his behalf by Clerance Lav, a trained medical scribe. The creation of this record is based on the scribe's personal observations and the provider's statements to them. This document has been checked and approved by the attending provider.

## 2020-05-22 ENCOUNTER — Ambulatory Visit: Payer: Self-pay | Admitting: General Surgery

## 2020-05-22 ENCOUNTER — Inpatient Hospital Stay: Payer: 59 | Attending: Hematology | Admitting: Hematology

## 2020-05-22 ENCOUNTER — Ambulatory Visit
Admission: RE | Admit: 2020-05-22 | Discharge: 2020-05-22 | Disposition: A | Discharge status: Home / Self Care | Payer: 59 | Source: Ambulatory Visit | Attending: Radiation Oncology | Admitting: Radiation Oncology

## 2020-05-22 ENCOUNTER — Encounter: Payer: Self-pay | Admitting: Hematology

## 2020-05-22 ENCOUNTER — Encounter: Payer: Self-pay | Admitting: Physical Therapy

## 2020-05-22 ENCOUNTER — Other Ambulatory Visit: Payer: Self-pay

## 2020-05-22 ENCOUNTER — Inpatient Hospital Stay: Payer: 59

## 2020-05-22 ENCOUNTER — Ambulatory Visit: Payer: 59 | Admitting: Physical Therapy

## 2020-05-22 ENCOUNTER — Encounter: Payer: Self-pay | Admitting: General Practice

## 2020-05-22 ENCOUNTER — Encounter: Payer: Self-pay | Admitting: Radiation Oncology

## 2020-05-22 ENCOUNTER — Encounter: Payer: Self-pay | Admitting: *Deleted

## 2020-05-22 VITALS — BP 156/91 | HR 86 | Temp 97.9°F | Resp 17 | Ht 70.0 in | Wt 295.7 lb

## 2020-05-22 DIAGNOSIS — D0512 Intraductal carcinoma in situ of left breast: Secondary | ICD-10-CM | POA: Insufficient documentation

## 2020-05-22 DIAGNOSIS — E785 Hyperlipidemia, unspecified: Secondary | ICD-10-CM | POA: Diagnosis not present

## 2020-05-22 DIAGNOSIS — Z170 Estrogen receptor positive status [ER+]: Secondary | ICD-10-CM | POA: Diagnosis not present

## 2020-05-22 DIAGNOSIS — R293 Abnormal posture: Secondary | ICD-10-CM

## 2020-05-22 DIAGNOSIS — Z7982 Long term (current) use of aspirin: Secondary | ICD-10-CM | POA: Insufficient documentation

## 2020-05-22 DIAGNOSIS — M25612 Stiffness of left shoulder, not elsewhere classified: Secondary | ICD-10-CM

## 2020-05-22 DIAGNOSIS — I119 Hypertensive heart disease without heart failure: Secondary | ICD-10-CM | POA: Insufficient documentation

## 2020-05-22 DIAGNOSIS — M791 Myalgia, unspecified site: Secondary | ICD-10-CM | POA: Insufficient documentation

## 2020-05-22 DIAGNOSIS — E1151 Type 2 diabetes mellitus with diabetic peripheral angiopathy without gangrene: Secondary | ICD-10-CM | POA: Diagnosis not present

## 2020-05-22 DIAGNOSIS — Z79899 Other long term (current) drug therapy: Secondary | ICD-10-CM | POA: Insufficient documentation

## 2020-05-22 DIAGNOSIS — Z803 Family history of malignant neoplasm of breast: Secondary | ICD-10-CM | POA: Diagnosis not present

## 2020-05-22 DIAGNOSIS — M1990 Unspecified osteoarthritis, unspecified site: Secondary | ICD-10-CM | POA: Insufficient documentation

## 2020-05-22 DIAGNOSIS — Z87891 Personal history of nicotine dependence: Secondary | ICD-10-CM | POA: Insufficient documentation

## 2020-05-22 DIAGNOSIS — K219 Gastro-esophageal reflux disease without esophagitis: Secondary | ICD-10-CM | POA: Insufficient documentation

## 2020-05-22 LAB — CMP (CANCER CENTER ONLY)
ALT: 16 U/L (ref 0–44)
AST: 15 U/L (ref 15–41)
Albumin: 3.5 g/dL (ref 3.5–5.0)
Alkaline Phosphatase: 91 U/L (ref 38–126)
Anion gap: 6 (ref 5–15)
BUN: 12 mg/dL (ref 8–23)
CO2: 28 mmol/L (ref 22–32)
Calcium: 9.5 mg/dL (ref 8.9–10.3)
Chloride: 103 mmol/L (ref 98–111)
Creatinine: 0.82 mg/dL (ref 0.44–1.00)
GFR, Est AFR Am: >60 mL/min (ref >60)
GFR, Estimated: >60 mL/min (ref >60)
Glucose, Bld: 115 mg/dL — ABNORMAL HIGH (ref 70–99)
Potassium: 4.2 mmol/L (ref 3.5–5.1)
Sodium: 137 mmol/L (ref 135–145)
Total Bilirubin: 0.4 mg/dL (ref 0.3–1.2)
Total Protein: 7.7 g/dL (ref 6.5–8.1)

## 2020-05-22 LAB — CBC WITH DIFFERENTIAL (CANCER CENTER ONLY)
Abs Immature Granulocytes: 0.07 10*3/uL (ref 0.00–0.07)
Basophils Absolute: 0.1 10*3/uL (ref 0.0–0.1)
Basophils Relative: 1 %
Eosinophils Absolute: 0.3 10*3/uL (ref 0.0–0.5)
Eosinophils Relative: 3 %
HCT: 42.1 % (ref 36.0–46.0)
Hemoglobin: 13.7 g/dL (ref 12.0–15.0)
Immature Granulocytes: 1 %
Lymphocytes Relative: 19 %
Lymphs Abs: 2.2 10*3/uL (ref 0.7–4.0)
MCH: 27.1 pg (ref 26.0–34.0)
MCHC: 32.5 g/dL (ref 30.0–36.0)
MCV: 83.4 fL (ref 80.0–100.0)
Monocytes Absolute: 1 10*3/uL (ref 0.1–1.0)
Monocytes Relative: 9 %
Neutro Abs: 7.8 10*3/uL — ABNORMAL HIGH (ref 1.7–7.7)
Neutrophils Relative %: 67 %
Platelet Count: 415 10*3/uL — ABNORMAL HIGH (ref 150–400)
RBC: 5.05 MIL/uL (ref 3.87–5.11)
RDW: 13.9 % (ref 11.5–15.5)
WBC Count: 11.5 10*3/uL — ABNORMAL HIGH (ref 4.0–10.5)
nRBC: 0 % (ref 0.0–0.2)

## 2020-05-22 LAB — GENETIC SCREENING ORDER

## 2020-05-22 NOTE — Progress Notes (Signed)
[place]   Telephone:(336) [phone] Fax:([phone]) [place] Note   Patient Care Team: [name], MD as PCP - General (Family Medicine) Mauro Kaufmann, RN as Oncology Nurse Navigator Rockwell Germany, RN as Oncology Nurse Navigator [name], MD as Consulting Physician (General Surgery) Truitt Merle, MD as Consulting Physician (Hematology) Eppie Gibson, MD as Attending Physician (Radiation Oncology)  Date of Service:  05/22/2020   CHIEF COMPLAINTS/PURPOSE OF CONSULTATION:  Newly Diagnosed left breast DCIS   Oncology History Overview Note  Cancer Staging Ductal carcinoma in situ (DCIS) of left breast Staging form: Breast, AJCC 8th Edition - Clinical stage from 05/22/2020: Stage 0 (cTis (DCIS), cN0, cM0, ER+, PR+) - Unsigned    Ductal carcinoma in situ (DCIS) of left breast  04/26/2020 Mammogram   IMPRESSION: 1. There is a possible subtle distortion in the lateral right breast without a definite sonographic correlate.   2.  No evidence of right axillary lymphadenopathy.   3. There are 2 adjacent abutting masses in the left breast at 3:30, 7cmfn which are indeterminate. One of these is an irregular mass measuring 9 x 3 x 5 mm. An adjacent abutting mass measures 4 x 3 x 4 mm.   4.  No evidence of left axillary lymphadenopathy.     05/10/2020 Initial Biopsy   Diagnosis 1. Breast, left, needle core biopsy, 3:30 o'clock - CARCINOMA INVOLVING PAPILLARY LESION; SEE COMMENT. 2. Breast, right, needle core biopsy, lower, outer quadrant - COMPLEX SCLEROSING LESION WITH CALCIFICATIONS. Microscopic Comment 1. The differential diagnosis includes invasive papillary carcinoma and solid papillary carcinoma. Immunohistochemistry for E-cadherin is positive. CK5/6 is negative. SMM-1 is positive, Calponin is equivocal and P63 is negative.   1. PROGNOSTIC INDICATORS Results: IMMUNOHISTOCHEMICAL AND MORPHOMETRIC ANALYSIS PERFORMED  MANUALLY Estrogen Receptor: 100%, POSITIVE, STRONG STAINING INTENSITY Progesterone Receptor: 100%, POSITIVE, STRONG STAINING INTENSITY   05/17/2020 Initial Diagnosis   Ductal carcinoma in situ (DCIS) of left breast      HISTORY OF PRESENTING ILLNESS:  Kelli Horn 64 y.o. female is a here because of newly diagnosed left breast DCIS. The patient presents to the breast clinic today accompanied by hue family member.   Her mass was found be screening mammogram. She notes she was having intermittent right beast burning sensation for the past 6 months but not in area of her left breast cancer and her breast got larger. Her pain improved after stopping diet mountain dew.   Today the patient notes baseline arthritis and MSK pain. She has DDD. She has limited ROM and is on Muscle relaxant. She notes she currently has a tooth infection and her dentist called in antibiotics today.   Socially she is married with 2 children. She is retired. She quit smoking in 2005 after smoking 2ppd for 10 years.  She has a PMHx of prior chest pain resolved and has SOB. She is being seen pulmonologist and cardiologist. She may have vascular surgery in the future. She also has DM, HTN. I reviewed her medication list with her. She is s/p appendectomy and left cardiac cath. She plans to have biopsy of her vaginal cysts. Her mother had lung cancer and her great aunt had breast cancer.    GYN HISTORY  Menarchal: 13 LMP: 2015 No HR 10 year of contraception   G2P2: first at age 34     REVIEW OF SYSTEMS:    Constitutional: Denies fevers, chills or abnormal night sweats Eyes: Denies blurriness of vision, double  vision or watery eyes Ears, nose, mouth, throat, and face: Denies mucositis or sore throat Respiratory: Denies cough, dyspnea or wheezes Cardiovascular: Denies palpitation, chest discomfort or lower extremity swelling Gastrointestinal:  Denies nausea, heartburn or change in bowel habits Skin: Denies abnormal  skin rashes MSK: (+) arthritis with limited ROM Lymphatics: Denies new lymphadenopathy or easy bruising Neurological:Denies numbness, tingling or new weaknesses Behavioral/Psych: Mood is stable, no new changes  All other systems were reviewed with the patient and are negative.   MEDICAL HISTORY:  Past Medical History:  Diagnosis Date  . Acid reflux   . Asthma   . Breast cancer (HCC)   . Diabetes mellitus without complication (HCC)   . Hyperlipidemia   . Hypertension     SURGICAL HISTORY: Past Surgical History:  Procedure Laterality Date  . APPENDECTOMY    . LEFT HEART CATH AND CORONARY ANGIOGRAPHY N/A 04/23/2020   Procedure: LEFT HEART CATH AND CORONARY ANGIOGRAPHY;  Surgeon: Jordan, Peter M, MD;  Location: [facility];  Service: Cardiovascular;  Laterality: N/A;    SOCIAL HISTORY: Social History   Socioeconomic History  . Marital status: Married    Spouse name: Not on file  . Number of children: 2  . Years of education: Not on file  . Highest education level: Not on file  Occupational History  . Occupation: retired   Tobacco Use  . Smoking status: Former Smoker    Packs/day: 2.00    Years: 10.00    Pack years: 20.00    Types: Cigarettes    Quit date: 04/16/2004    Years since quitting: 16.1  . Smokeless tobacco: Never Used  Substance and Sexual Activity  . Alcohol use: No  . Drug use: No  . Sexual activity: Not Currently  Other Topics Concern  . Not on file  Social History Narrative  . Not on file   Social Determinants of Health   Financial Resource Strain:   . Difficulty of Paying Living Expenses: Not on file  Food Insecurity:   . Worried About Running Out of Food in the Last Year: Not on file  . Ran Out of Food in the Last Year: Not on file  Transportation Needs:   . Lack of Transportation (Medical): Not on file  . Lack of Transportation (Non-Medical): Not on file  Physical Activity:   . Days of Exercise per Week: Not on file  . Minutes of  Exercise per Session: Not on file  Stress:   . Feeling of Stress : Not on file  Social Connections:   . Frequency of Communication with Friends and Family: Not on file  . Frequency of Social Gatherings with Friends and Family: Not on file  . Attends Religious Services: Not on file  . Active Member of Clubs or Organizations: Not on file  . Attends Club or Organization Meetings: Not on file  . Marital Status: Not on file  Intimate Partner Violence:   . Fear of Current or Ex-Partner: Not on file  . Emotionally Abused: Not on file  . Physically Abused: Not on file  . Sexually Abused: Not on file    FAMILY HISTORY: Family History  Problem Relation Age of Onset  . Heart attack Father 56  . Aneurysm Father   . Breast cancer Maternal Aunt   . Lung cancer Mother   . Diabetes Other   . Heart disease Paternal Uncle   . Aneurysm Paternal Uncle     ALLERGIES:  is allergic to carvedilol   and peanut-containing drug products.  MEDICATIONS:  Current Outpatient Medications  Medication Sig Dispense Refill  . albuterol (PROVENTIL HFA;VENTOLIN HFA) 108 (90 Base) MCG/ACT inhaler Inhale 2 puffs into the lungs every 6 (six) hours as needed for wheezing or shortness of breath.     Marland Kitchen albuterol (PROVENTIL) (2.5 MG/3ML) 0.083% nebulizer solution Take 2.5 mg by nebulization every 6 (six) hours as needed for shortness of breath.    Marland Kitchen amLODipine (NORVASC) 5 MG tablet Take 1 tablet (5 mg total) by mouth daily. 30 tablet 11  . aspirin EC 81 MG tablet Take 81 mg by mouth daily.    Marland Kitchen b complex vitamins tablet Take 1 tablet by mouth daily as needed (vertigo).    . [employer] FIBER GUMMIES PO Take 2 tablets by mouth daily.    . enalapril (VASOTEC) 10 MG tablet Take 5-15 mg by mouth 2 (two) times daily as needed (high bp).     . Fluticasone-Umeclidin-Vilant (TRELEGY ELLIPTA) 100-62.5-25 MCG/INH AEPB Inhale 1 puff into the lungs daily. 1 each 6  . hydrochlorothiazide (HYDRODIURIL) 25 MG tablet Take 12.5 mg by mouth daily  as needed (if the enalapril-hctz is causing severe dry eye).     . hydrOXYzine (VISTARIL) 25 MG capsule Take 25 mg by mouth 2 (two) times daily as needed for itching.     Marland Kitchen ibuprofen (ADVIL) 200 MG tablet Take 400 mg by mouth every 6 (six) hours as needed for headache or moderate pain.    . methocarbamol (ROBAXIN) 750 MG tablet Take 750 mg by mouth 2 (two) times daily as needed for muscle spasms.     . nitroGLYCERIN (NITROSTAT) 0.4 MG SL tablet Place 1 tablet (0.4 mg total) under the tongue every 5 (five) minutes as needed for chest pain. 30 tablet 0  . Polyethyl Glycol-Propyl Glycol (SYSTANE ULTRA) 0.4-0.3 % SOLN Place 1 drop into both eyes 3 (three) times daily as needed (dry eyes).     . Polyvinyl Alcohol-Povidone (REFRESH OP) Place 1 drop into both eyes 4 (four) times daily as needed (dry eyes).    . rosuvastatin (CRESTOR) 5 MG tablet Take 5 mg once a week (Patient taking differently: Take 5 mg by mouth every Saturday. ) 30 tablet 6   Current Facility-Administered Medications  Medication Dose Route Frequency Provider Last Rate Last Admin  . sodium chloride flush (NS) 0.9 % injection 3 mL  3 mL Intravenous Q12H Martinique, Peter M, MD        PHYSICAL EXAMINATION: ECOG PERFORMANCE STATUS: 1 - Symptomatic but completely ambulatory  Vitals:   05/22/20 1244  BP: (!) 156/91  Pulse: 86  Resp: 17  Temp: 97.9 F (36.6 C)  SpO2: 97%   Filed Weights   05/22/20 1244  Weight: 295 lb 11.2 oz (134.1 kg)    GENERAL:alert, no distress and comfortable SKIN: skin color, texture, turgor are normal, no rashes or significant lesions EYES: normal, Conjunctiva are pink and non-injected, sclera clear  NECK: supple, thyroid normal size, non-tender, without nodularity LYMPH:  no palpable lymphadenopathy in the cervical, axillary  LUNGS: clear to auscultation and percussion with normal breathing effort HEART: regular rate & rhythm and no murmurs and no lower extremity edema ABDOMEN:abdomen soft, non-tender  and normal bowel sounds Musculoskeletal:no cyanosis of digits and no clubbing  NEURO: alert & oriented x 3 with fluent speech, no focal motor/sensory deficits BREAST: (+) Left breast tenderness. (+) Lumpy left breast tissue with skin ecchymosis at biopsy site. No palpable mass, nodules or adenopathy bilaterally.  Breast exam benign.  LABORATORY DATA:  I have reviewed the data as listed CBC Latest Ref Rng & Units 05/22/2020 04/19/2020 01/27/2020  WBC 4.0 - 10.5 K/uL 11.5(H) 11.4(H) 10.1  Hemoglobin 12.0 - 15.0 g/dL 13.7 13.7 13.8  Hematocrit 36 - 46 % 42.1 41.8 43.5  Platelets 150 - 400 K/uL 415(H) 499(H) 389    CMP Latest Ref Rng & Units 05/22/2020 04/19/2020 01/27/2020  Glucose 70 - 99 mg/dL 115(H) 99 105(H)  BUN 8 - 23 mg/dL _0 Creatinine 0.44 - 1.00 mg/dL 0.82 0.91 0.78  Sodium 135 - 145 mmol/L 137 136 140  Potassium 3.5 - 5.1 mmol/L 4.2 4.5 4.4  Chloride 98 - 111 mmol/L 103 97 104  CO2 22 - 32 mmol/L _1 Calcium 8.9 - 10.3 mg/dL 9.5 9.5 9.2  Total Protein 6.5 - 8.1 g/dL 7.7 7.2 7.1  Total Bilirubin 0.3 - 1.2 mg/dL 0.4 0.3 0.7  Alkaline Phos 38 - 126 U/L 91 93 77  AST 15 - 41 U/L _2 ALT 0 - 44 U/L _3 RADIOGRAPHIC STUDIES: I have personally reviewed the radiological images as listed and agreed with the findings in the report. CARDIAC CATHETERIZATION  Result Date: 04/23/2020  Mid RCA lesion is 25% stenosed.  Mid LAD lesion is 25% stenosed.  The left ventricular systolic function is normal.  LV end diastolic pressure is mildly elevated.  The left ventricular ejection fraction is greater than 65% by visual estimate.  1. Nonobstructive CAD 2. Normal LV function 3. Mildly elevated LVEDP 4. Hypertensive 5. No significant right innominate or subclavian pressure gradient. Plan: will optimize BP control. Will arrange PFTs, Echo, Carotid and LE arterial dopplers. She is intolerant of beta blockers. Will initiate amlodipine in addition to enalapril for BP  control.   US BREAST LTD UNI LEFT INC AXILLA  Result Date: 04/26/2020 CLINICAL DATA:  Screening recall for a possible breast asymmetry and a possible left breast mass or masses. EXAM: DIGITAL DIAGNOSTIC BILATERAL MAMMOGRAM WITH TOMO AND CAD; ULTRASOUND RIGHT BREAST LIMITED; ULTRASOUND LEFT BREAST LIMITED COMPARISON:  Previous exam(s). ACR Breast Density Category b: There are scattered areas of fibroglandular density. FINDINGS: Spot compression tomosynthesis images through the lateral posterior right breast demonstrates a persistent irregular asymmetry, which on the CC view has a subtle appearance of possible distortion. Spot compression tomosynthesis images of the lateral posterior left breast demonstrates clustered oval circumscribed masses together measuring approximately 2.2 cm. Mammographic images were processed with CAD. Ultrasound targeted to the you lateral aspect of the right breast demonstrates scattered areas of shadowing without a definite correlate for the subtle appearance of distortion on the mammogram. No abnormal lymph nodes are identified in the right axilla. Ultrasound targeted to the left breast at 3:30, 7 cm from the nipple demonstrates a cluster of hypoechoic masses. One of these is an irregular mass measuring 9 x 3 x 5 mm. An adjacent abutting mass measures 4 x 3 x 4 mm. Normal lymph nodes are identified in the left axilla. IMPRESSION: 1. There is a possible subtle distortion in the lateral right breast without a definite sonographic correlate. 2.  No evidence of right axillary lymphadenopathy. 3. There are 2 adjacent abutting masses in the left breast at 3:30 which are indeterminate. 4.  No evidence of left axillary lymphadenopathy. RECOMMENDATION: 1. Stereotactic biopsy is recommended for the possible subtle distortion in the lateral right breast. 2. Ultrasound-guided biopsy is recommended for the  2 adjacent abutting masses in the left breast at 3:30. These may be sampled as 1 lesion  together. These procedures have been scheduled for 05/10/2020 at 7:30 a.m. I have discussed the findings and recommendations with the patient. If applicable, a reminder letter will be sent to the patient regarding the next appointment. BI-RADS CATEGORY  4: Suspicious. Electronically Signed   By: Ammie Ferrier M.D.   On: 04/26/2020 16:24   US BREAST LTD UNI RIGHT INC AXILLA  Result Date: 04/26/2020 CLINICAL DATA:  Screening recall for a possible breast asymmetry and a possible left breast mass or masses. EXAM: DIGITAL DIAGNOSTIC BILATERAL MAMMOGRAM WITH TOMO AND CAD; ULTRASOUND RIGHT BREAST LIMITED; ULTRASOUND LEFT BREAST LIMITED COMPARISON:  Previous exam(s). ACR Breast Density Category b: There are scattered areas of fibroglandular density. FINDINGS: Spot compression tomosynthesis images through the lateral posterior right breast demonstrates a persistent irregular asymmetry, which on the CC view has a subtle appearance of possible distortion. Spot compression tomosynthesis images of the lateral posterior left breast demonstrates clustered oval circumscribed masses together measuring approximately 2.2 cm. Mammographic images were processed with CAD. Ultrasound targeted to the you lateral aspect of the right breast demonstrates scattered areas of shadowing without a definite correlate for the subtle appearance of distortion on the mammogram. No abnormal lymph nodes are identified in the right axilla. Ultrasound targeted to the left breast at 3:30, 7 cm from the nipple demonstrates a cluster of hypoechoic masses. One of these is an irregular mass measuring 9 x 3 x 5 mm. An adjacent abutting mass measures 4 x 3 x 4 mm. Normal lymph nodes are identified in the left axilla. IMPRESSION: 1. There is a possible subtle distortion in the lateral right breast without a definite sonographic correlate. 2.  No evidence of right axillary lymphadenopathy. 3. There are 2 adjacent abutting masses in the left breast at 3:30  which are indeterminate. 4.  No evidence of left axillary lymphadenopathy. RECOMMENDATION: 1. Stereotactic biopsy is recommended for the possible subtle distortion in the lateral right breast. 2. Ultrasound-guided biopsy is recommended for the 2 adjacent abutting masses in the left breast at 3:30. These may be sampled as 1 lesion together. These procedures have been scheduled for 05/10/2020 at 7:30 a.m. I have discussed the findings and recommendations with the patient. If applicable, a reminder letter will be sent to the patient regarding the next appointment. BI-RADS CATEGORY  4: Suspicious. Electronically Signed   By: Ammie Ferrier M.D.   On: 04/26/2020 16:24   MM DIAG BREAST TOMO BILATERAL  Result Date: 04/26/2020 CLINICAL DATA:  Screening recall for a possible breast asymmetry and a possible left breast mass or masses. EXAM: DIGITAL DIAGNOSTIC BILATERAL MAMMOGRAM WITH TOMO AND CAD; ULTRASOUND RIGHT BREAST LIMITED; ULTRASOUND LEFT BREAST LIMITED COMPARISON:  Previous exam(s). ACR Breast Density Category b: There are scattered areas of fibroglandular density. FINDINGS: Spot compression tomosynthesis images through the lateral posterior right breast demonstrates a persistent irregular asymmetry, which on the CC view has a subtle appearance of possible distortion. Spot compression tomosynthesis images of the lateral posterior left breast demonstrates clustered oval circumscribed masses together measuring approximately 2.2 cm. Mammographic images were processed with CAD. Ultrasound targeted to the you lateral aspect of the right breast demonstrates scattered areas of shadowing without a definite correlate for the subtle appearance of distortion on the mammogram. No abnormal lymph nodes are identified in the right axilla. Ultrasound targeted to the left breast at 3:30, 7 cm from the nipple demonstrates a cluster  of hypoechoic masses. One of these is an irregular mass measuring 9 x 3 x 5 mm. An adjacent  abutting mass measures 4 x 3 x 4 mm. Normal lymph nodes are identified in the left axilla. IMPRESSION: 1. There is a possible subtle distortion in the lateral right breast without a definite sonographic correlate. 2.  No evidence of right axillary lymphadenopathy. 3. There are 2 adjacent abutting masses in the left breast at 3:30 which are indeterminate. 4.  No evidence of left axillary lymphadenopathy. RECOMMENDATION: 1. Stereotactic biopsy is recommended for the possible subtle distortion in the lateral right breast. 2. Ultrasound-guided biopsy is recommended for the 2 adjacent abutting masses in the left breast at 3:30. These may be sampled as 1 lesion together. These procedures have been scheduled for 05/10/2020 at 7:30 a.m. I have discussed the findings and recommendations with the patient. If applicable, a reminder letter will be sent to the patient regarding the next appointment. BI-RADS CATEGORY  4: Suspicious. Electronically Signed   By: Ammie Ferrier M.D.   On: 04/26/2020 16:24   ECHOCARDIOGRAM COMPLETE  Result Date: 05/13/2020    ECHOCARDIOGRAM REPORT   Patient Name:   GEARLDEAN LOMANTO Date of Exam: 05/13/2020 Medical Rec #:  [id number]      Height:       70.0 in Accession #:    [id number]     Weight:       291.0 lb Date of Birth:  06-03-1956       BSA:          2.448 m Patient Age:    62 years       BP:           144/49 mmHg Patient Gender: F              HR:           74 bpm. Exam Location:  [address] Procedure: 2D Echo, Cardiac Doppler and Color Doppler Indications:    Dyspnea  History:        Patient has prior history of Echocardiogram examinations, most                 recent 12/11/2015. PAD, Signs/Symptoms:Shortness of Breath and                 Chest Pain; Risk Factors:Hypertension, Diabetes, Dyslipidemia                 and Morbid obesity.  Sonographer:    Dustin Flock RDCS Referring Phys: [id number] PETER M Martinique  Sonographer Comments: Patient is morbidly obese. Image acquisition  challenging due to patient body habitus. IMPRESSIONS  1. Left ventricular ejection fraction, by estimation, is 55 to 60%. The left ventricle has normal function. The left ventricle has no regional wall motion abnormalities. There is mild concentric left ventricular hypertrophy. Left ventricular diastolic parameters are indeterminate. Elevated left ventricular end-diastolic pressure.  2. Right ventricular systolic function is normal. The right ventricular size is normal. Tricuspid regurgitation signal is inadequate for assessing PA pressure.  3. Left atrial size was mildly dilated.  4. Right atrial size was mildly dilated.  5. The mitral valve is normal in structure. Trivial mitral valve regurgitation. No evidence of mitral stenosis. Moderate mitral annular calcification.  6. The aortic valve is tricuspid. There is mild calcification of the aortic valve. There is mild thickening of the aortic valve. Aortic valve regurgitation is not visualized. Mild aortic valve sclerosis is present, with no evidence of aortic  valve stenosis.  7. The inferior vena cava is normal in size with greater than 50% respiratory variability, suggesting right atrial pressure of 3 mmHg. Conclusion(s)/Recommendation(s): Otherwise normal echocardiogram, with minor abnormalities described in the report. FINDINGS  Left Ventricle: Left ventricular ejection fraction, by estimation, is 55 to 60%. The left ventricle has normal function. The left ventricle has no regional wall motion abnormalities. The left ventricular internal cavity size was normal in size. There is  mild concentric left ventricular hypertrophy. Left ventricular diastolic parameters are indeterminate. Elevated left ventricular end-diastolic pressure. The E/e' is 21. Right Ventricle: The right ventricular size is normal. Right vetricular wall thickness was not well visualized. Right ventricular systolic function is normal. Tricuspid regurgitation signal is inadequate for assessing PA  pressure. Left Atrium: Left atrial size was mildly dilated. Right Atrium: Right atrial size was mildly dilated. Pericardium: Trivial pericardial effusion is present. Mitral Valve: The mitral valve is normal in structure. Moderate mitral annular calcification. Trivial mitral valve regurgitation. No evidence of mitral valve stenosis. Tricuspid Valve: The tricuspid valve is normal in structure. Tricuspid valve regurgitation is trivial. No evidence of tricuspid stenosis. Aortic Valve: The aortic valve is tricuspid. There is mild calcification of the aortic valve. There is mild thickening of the aortic valve. Aortic valve regurgitation is not visualized. Mild aortic valve sclerosis is present, with no evidence of aortic valve stenosis. Pulmonic Valve: The pulmonic valve was not well visualized. Pulmonic valve regurgitation is not visualized. No evidence of pulmonic stenosis. Aorta: The aortic root and ascending aorta are structurally normal, with no evidence of dilitation. Venous: The inferior vena cava is normal in size with greater than 50% respiratory variability, suggesting right atrial pressure of 3 mmHg. IAS/Shunts: The atrial septum is grossly normal.  LEFT VENTRICLE PLAX 2D LVIDd:         4.20 cm  Diastology LVIDs:         3.00 cm  LV e' medial:    6.09 cm/s LV PW:         1.20 cm  LV E/e' medial:  20.4 LV IVS:        1.30 cm  LV e' lateral:   5.87 cm/s LVOT diam:     2.00 cm  LV E/e' lateral: 21.1 LV SV:         93 LV SV Index:   38 LVOT Area:     3.14 cm  RIGHT VENTRICLE RV Basal diam:  3.50 cm RV S prime:     13.70 cm/s TAPSE (M-mode): 2.2 cm LEFT ATRIUM             Index       RIGHT ATRIUM           Index LA diam:        4.20 cm 1.72 cm/m  RA Area:     18.00 cm LA Vol (A2C):   49.0 ml 20.02 ml/m RA Volume:   53.10 ml  21.69 ml/m LA Vol (A4C):   72.1 ml 29.45 ml/m LA Biplane Vol: 63.0 ml 25.74 ml/m  AORTIC VALVE LVOT Vmax:   123.00 cm/s LVOT Vmean:  83.500 cm/s LVOT VTI:    0.296 m  AORTA Ao Root diam:  2.60 cm MITRAL VALVE MV Area (PHT): 4.96 cm     SHUNTS MV Decel Time: 153 msec     Systemic VTI:  0.30 m MV E velocity: 124.00 cm/s  Systemic Diam: 2.00 cm MV A velocity: 140.00 cm/s MV E/A ratio:  0.89  Bridgette Christopher MD Electronically signed by Bridgette Christopher MD Signature Date/Time: 05/13/2020/1:21:49 PM    Final    MM CLIP PLACEMENT LEFT  Result Date: 05/10/2020 CLINICAL DATA:  Status post ultrasound-guided LEFT breast biopsy and stereotactic-guided RIGHT breast biopsy. EXAM: DIAGNOSTIC BILATERAL MAMMOGRAM POST ULTRASOUND AND STEREOTACTIC BIOPSIES COMPARISON:  Previous exam(s). FINDINGS: Mammographic images were obtained following ultrasound guided biopsy of LEFT breast mass (abutting masses) and stereotactic guided biopsy of RIGHT breast asymmetry/possible distortion. The biopsy marking clip is in expected position at the site of biopsy in the LEFT breast. Biopsy marker clip is displaced approximately 2 cm inferior to the targeted asymmetry/distortion in the outer RIGHT breast. IMPRESSION: 1. Appropriate positioning of the coil shaped biopsy marking clip at the site of biopsy in the outer LEFT breast. 2. Approximately 2 cm inferior displacement of the coil shaped biopsy marking clip in the RIGHT breast relative to the targeted asymmetry/possible distortion. Final Assessment: Post Procedure Mammograms for Marker Placement Electronically Signed   By: Stan  Maynard M.D.   On: 05/10/2020 09:10   MM CLIP PLACEMENT RIGHT  Result Date: 05/10/2020 CLINICAL DATA:  Status post ultrasound-guided LEFT breast biopsy and stereotactic-guided RIGHT breast biopsy. EXAM: DIAGNOSTIC BILATERAL MAMMOGRAM POST ULTRASOUND AND STEREOTACTIC BIOPSIES COMPARISON:  Previous exam(s). FINDINGS: Mammographic images were obtained following ultrasound guided biopsy of LEFT breast mass (abutting masses) and stereotactic guided biopsy of RIGHT breast asymmetry/possible distortion. The biopsy marking clip is in expected  position at the site of biopsy in the LEFT breast. Biopsy marker clip is displaced approximately 2 cm inferior to the targeted asymmetry/distortion in the outer RIGHT breast. IMPRESSION: 1. Appropriate positioning of the coil shaped biopsy marking clip at the site of biopsy in the outer LEFT breast. 2. Approximately 2 cm inferior displacement of the coil shaped biopsy marking clip in the RIGHT breast relative to the targeted asymmetry/possible distortion. Final Assessment: Post Procedure Mammograms for Marker Placement Electronically Signed   By: Stan  Maynard M.D.   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 The differential diagnosis includes invasive papillary carcinoma and solid papillary carcinoma. This was found to be concordant by Dr. Stan Maynard. Pathology revealed COMPLEX SCLEROSING LESION WITH CALCIFICATIONS of the RIGHT breast, outer quadrant. This was found to be concordant by Dr. Stan Maynard, with excision recommended. Pathology results were discussed with the patient by telephone. The patient reported doing well after the biopsies with tenderness at the sites. Post biopsy instructions and care were reviewed and questions were answered. The patient was encouraged to call [facility] for any additional concerns. My direct phone number was provided. The patient was referred to [facility] on May 22, 2020. Pathology results reported by Lynne Bailey, RN on 05/14/2020. Electronically Signed   By: Stan  Maynard M.D.   On: 05/14/2020 13:49   Result Date: 05/21/2020 CLINICAL DATA:  Patient with 2 abutting masses within the LEFT breast at the 3:30 o'clock axis presents today for ultrasound-guided  core biopsy. EXAM: ULTRASOUND GUIDED LEFT BREAST CORE NEEDLE BIOPSY COMPARISON:  Previous exam(s). PROCEDURE: I met with the patient and we discussed the procedure of ultrasound-guided biopsy, including benefits and alternatives. We discussed the high likelihood of a successful procedure. We discussed the risks   of the procedure, including infection, bleeding, tissue injury, clip migration, and inadequate sampling. Informed written consent was given. The usual time-out protocol was performed immediately prior to the procedure. Lesion quadrant: Lower outer quadrant Using sterile technique and 1% Lidocaine as local anesthetic, under direct ultrasound visualization, a 12 gauge spring-loaded device was used to perform biopsy of the 2 abutting masses in the LEFT breast at the 3:30 o'clock axis (samples placed in 1 formalin container) using a lateral approach. At the conclusion of the procedure coil shaped tissue marker clip was deployed into the biopsy cavity. Follow up 2 view mammogram was performed and dictated separately. IMPRESSION: Ultrasound guided biopsy of the LEFT breast mass (2 abutting/contiguous masses) within the LEFT breast at the 3:30 o'clock axis. Samples placed in a single formalin container. No apparent complications. Patient is scheduled for stereotactic biopsy of RIGHT breast asymmetry/possible subtle distortion later today. Electronically Signed: By: Stan  Maynard M.D. 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 The differential diagnosis includes invasive papillary carcinoma and solid papillary carcinoma. This was found to be concordant by Dr. Stan Maynard. Pathology revealed COMPLEX SCLEROSING LESION WITH CALCIFICATIONS of the RIGHT breast, outer quadrant. This was found to be concordant by  Dr. Stan Maynard, with excision recommended. Pathology results were discussed with the patient by telephone. The patient reported doing well after the biopsies with tenderness at the sites. Post biopsy instructions and care were reviewed and questions were answered. The patient was encouraged to [facility] for [facility] additional concerns. My direct phone number was provided. The patient was referred to [facility] on May 22, 2020. Pathology results reported by Lynne Bailey, RN on 05/14/2020. Electronically Signed   By: Stan  Maynard M.D.   On: 05/14/2020 13:49   Result Date: 05/21/2020 CLINICAL DATA:  Patient with possible subtle architectural distortion in the outer RIGHT breast presents today for stereotactic biopsy. EXAM: RIGHT BREAST STEREOTACTIC CORE NEEDLE BIOPSY COMPARISON:  Previous exams. FINDINGS: The patient and I discussed the procedure of stereotactic-guided biopsy including benefits and alternatives. We discussed the high likelihood of a successful procedure. We discussed the risks of the procedure including infection, bleeding, tissue injury, clip migration, and inadequate sampling. Informed written consent was given. The usual time out protocol was performed immediately prior to the procedure. Using sterile technique and 1% Lidocaine as local anesthetic, under stereotactic guidance, a 9 gauge vacuum assisted device was used to perform core needle biopsy of asymmetry/possible subtle distortion in the lower outer quadrant of the RIGHT breast using a superior approach. Lesion quadrant: Lower outer quadrant At the conclusion of the procedure, coil shaped tissue marker clip was deployed into the biopsy cavity. Follow-up 2-view mammogram was performed and dictated separately. IMPRESSION: Stereotactic-guided biopsy of RIGHT breast asymmetry/possible distortion in the outer RIGHT breast. No  apparent complications. Electronically Signed: By: Stan  Maynard M.D. On: 05/10/2020 09:00   VAS US LE ART SEG MULTI (Segm&LE Reynauds)  Result Date: 05/19/2020 LOWER EXTREMITY DOPPLER STUDY Indications: Peripheral artery disease. Patient reports claudication symptoms              that she describes as a weakness in both of her legs, she is unable                to walk very far without feeling like her legs will give out. It              takes less than walking from the parking lot up to this building to              kick in. She thinks the right hip is what bothers her the most. High Risk Factors: Hypertension, hyperlipidemia, past history of smoking,                    coronary artery disease. Other Factors: CT abdomen/pelvis performed on 04/05/20 showed severe calcific                atherosclerosis of the bilateral common iliac arteries.  Performing Technologist: Danielle Schmitt RVT  Examination Guidelines: A complete evaluation includes at minimum, Doppler waveform signals and systolic blood pressure reading at the level of bilateral brachial, anterior tibial, and posterior tibial arteries, when vessel segments are accessible. Bilateral testing is considered an integral part of a complete examination. Photoelectric Plethysmograph (PPG) waveforms and toe systolic pressure readings are included as required and additional duplex testing as needed. Limited examinations for reoccurring indications may be performed as noted.  ABI Findings: +---------+------------------+-----+----------+--------+ Right    Rt Pressure (mmHg)IndexWaveform  Comment  +---------+------------------+-----+----------+--------+ Brachial 192                                       +---------+------------------+-----+----------+--------+ CFA                             monophasic         +---------+------------------+-----+----------+--------+ Popliteal                       monophasic          +---------+------------------+-----+----------+--------+ ATA      90                0.47 monophasic         +---------+------------------+-----+----------+--------+ PTA      112               0.58 monophasic         +---------+------------------+-----+----------+--------+ PERO     108               0.56 monophasic         +---------+------------------+-----+----------+--------+ Great Toe107               0.56 Abnormal           +---------+------------------+-----+----------+--------+ +---------+------------------+-----+----------+-------+ Left     Lt Pressure (mmHg)IndexWaveform  Comment +---------+------------------+-----+----------+-------+ Brachial 181                                      +---------+------------------+-----+----------+-------+ CFA                             monophasic        +---------+------------------+-----+----------+-------+ Popliteal                       monophasic        +---------+------------------+-----+----------+-------+ ATA      96                  0.50 monophasic        +---------+------------------+-----+----------+-------+ PTA      113               0.59 monophasic        +---------+------------------+-----+----------+-------+ PERO     102               0.53 monophasic        +---------+------------------+-----+----------+-------+ Great Toe87                0.45 Abnormal          +---------+------------------+-----+----------+-------+ +-------+-----------+-----------+------------+------------+ ABI/TBIToday's ABIToday's TBIPrevious ABIPrevious TBI +-------+-----------+-----------+------------+------------+ Right  0.58       0.56                                +-------+-----------+-----------+------------+------------+ Left   0.59       0.45                                +-------+-----------+-----------+------------+------------+  Summary: Right: Resting right ankle-brachial index indicates  moderate right lower extremity arterial disease. The right toe-brachial index is abnormal. Left: Resting left ankle-brachial index indicates moderate left lower extremity arterial disease. The left toe-brachial index is abnormal.  *See table(s) above for measurements and observations. See arterial duplex report.  Vascular consult recommended. Electronically signed by Muhammad Arida MD on 05/19/2020 at 2:44:14 PM.    Final    VAS US CAROTID  Result Date: 05/19/2020 Carotid Arterial Duplex Study Indications:  Left carotid and bilateral subclaivan bruits. Risk Factors: Hypertension, hyperlipidemia, past history of smoking, PAD. Performing Technologist: Danielle Schmitt RVT  Examination Guidelines: A complete evaluation includes B-mode imaging, spectral Doppler, color Doppler, and power Doppler as needed of all accessible portions of each vessel. Bilateral testing is considered an integral part of a complete examination. Limited examinations for reoccurring indications may be performed as noted.  Right Carotid Findings: +----------+--------+--------+--------+------------------+--------+           PSV cm/sEDV cm/sStenosisPlaque DescriptionComments +----------+--------+--------+--------+------------------+--------+ CCA Prox  62      12                                         +----------+--------+--------+--------+------------------+--------+ CCA Distal76      13                                         +----------+--------+--------+--------+------------------+--------+ ICA Prox  79      25              heterogenous               +----------+--------+--------+--------+------------------+--------+ ICA Mid   111     34      1-39%                              +----------+--------+--------+--------+------------------+--------+ ICA Distal120     43                                         +----------+--------+--------+--------+------------------+--------+ ECA         170     23                                          +----------+--------+--------+--------+------------------+--------+ +----------+--------+-------+---------+-------------------+           PSV cm/sEDV cmsDescribe Arm Pressure (mmHG) +----------+--------+-------+---------+-------------------+ Subclavian256            Turbulent192                 +----------+--------+-------+---------+-------------------+ +---------+--------+-+--------+-+--------------+ VertebralPSV cm/s0EDV cm/s0Not identified +---------+--------+-+--------+-+--------------+  Left Carotid Findings: +----------+--------+--------+--------+------------------+--------+           PSV cm/sEDV cm/sStenosisPlaque DescriptionComments +----------+--------+--------+--------+------------------+--------+ CCA Prox  95      24                                         +----------+--------+--------+--------+------------------+--------+ CCA Distal68      19                                         +----------+--------+--------+--------+------------------+--------+ ICA Prox  91      33              heterogenous               +----------+--------+--------+--------+------------------+--------+ ICA Mid   141     53      40-59%                             +----------+--------+--------+--------+------------------+--------+ ICA Distal109     36                                         +----------+--------+--------+--------+------------------+--------+ ECA       158     18                                         +----------+--------+--------+--------+------------------+--------+ +----------+--------+--------+--------+-------------------+           PSV cm/sEDV cm/sDescribeArm Pressure (mmHG) +----------+--------+--------+--------+-------------------+ Subclavian316             Stenotic181                 +----------+--------+--------+--------+-------------------+ +---------+--------+--+--------+--+---------+  VertebralPSV cm/s29EDV cm/s10Antegrade +---------+--------+--+--------+--+---------+   Summary: Right Carotid: Velocities in the right ICA are consistent with a 1-39% stenosis. Left Carotid: Velocities in the left ICA are consistent with a 40-59% stenosis. Vertebrals:  Left vertebral artery demonstrates antegrade flow. Right vertebral              artery demonstrates no discernable flow. Subclavians: Left subclavian artery was stenotic. Right subclavian artery flow              was disturbed. *See table(s) above for measurements and observations. Suggest follow up study in 12 months. Electronically signed by Muhammad Arida MD on 05/19/2020 at 2:46:50 PM.    Final    VAS US LOWER EXTREMITY ARTERIAL DUPLEX  Result Date: 05/19/2020 LOWER EXTREMITY ARTERIAL DUPLEX STUDY Indications: Peripheral artery disease. Patient reports claudication symptoms                that she describes as a weakness in both of her legs; she is unable              to walk very far without feeling like her legs will give out. It              takes less than walking from the parking lot up to this floor to              kick in. She thinks the right hip is what bothers her the most. High Risk Factors: Hypertension, hyperlipidemia, past history of smoking,                    coronary artery disease. Other Factors: CT abdomen/pelvis on 04/05/20 showed severe bilateral common iliac                narrowing/disease.  Current ABI: Today's ABIs are 0.58 on the right and 0.59 on the left. Limitations: Technically challenging and limited imaging of the aorta-iliacs due              to patient morbid obesity, bowel gas, flash artifact and poor              visualization. Multiple technologists attempted. Performing Technologist: Mariane Masters RVT Supporting Technologist: Wilkie Aye RVT  Examination Guidelines: A complete evaluation includes B-mode imaging, spectral Doppler, color Doppler, and power Doppler as needed of all accessible portions of  each vessel. Bilateral testing is considered an integral part of a complete examination. Limited examinations for reoccurring indications may be performed as noted. Aorta: +---+-------+----------+----------+--------+--------+-----+    AP (cm)Trans (cm)PSV (cm/s)WaveformThrombusShape +---+-------+----------+----------+--------+--------+-----+ Mid                 91        biphasic              +---+-------+----------+----------+--------+--------+-----+   +-----------+--------+-----+--------+----------+--------+ RIGHT      PSV cm/sRatioStenosisWaveform  Comments +-----------+--------+-----+--------+----------+--------+ EIA Mid    74                   monophasic         +-----------+--------+-----+--------+----------+--------+ EIA Distal 84                   monophasic         +-----------+--------+-----+--------+----------+--------+ CFA Prox   84                   monophasic         +-----------+--------+-----+--------+----------+--------+ DFA        26                   monophasic         +-----------+--------+-----+--------+----------+--------+ SFA Prox   82                   monophasic         +-----------+--------+-----+--------+----------+--------+ SFA Mid    53                   monophasic         +-----------+--------+-----+--------+----------+--------+ SFA Distal 39                   monophasic         +-----------+--------+-----+--------+----------+--------+ POP Prox   27                   monophasic         +-----------+--------+-----+--------+----------+--------+  POP Distal 26                   monophasic         +-----------+--------+-----+--------+----------+--------+ TP Trunk   41                   monophasic         +-----------+--------+-----+--------+----------+--------+ ATA Distal 19                   monophasic         +-----------+--------+-----+--------+----------+--------+ PTA Distal 23                    monophasic         +-----------+--------+-----+--------+----------+--------+ PERO Distal24                   monophasic         +-----------+--------+-----+--------+----------+--------+  +-----------+--------+-----+--------+----------+--------+ LEFT       PSV cm/sRatioStenosisWaveform  Comments +-----------+--------+-----+--------+----------+--------+ EIA Mid    78                   monophasic         +-----------+--------+-----+--------+----------+--------+ EIA Distal 69                   monophasic         +-----------+--------+-----+--------+----------+--------+ CFA Prox   79                   monophasic         +-----------+--------+-----+--------+----------+--------+ DFA        34                   monophasic         +-----------+--------+-----+--------+----------+--------+ SFA Prox   74                   monophasic         +-----------+--------+-----+--------+----------+--------+ SFA Mid    58                   monophasic         +-----------+--------+-----+--------+----------+--------+ SFA Distal 49                   monophasic         +-----------+--------+-----+--------+----------+--------+ POP Prox   29                   monophasic         +-----------+--------+-----+--------+----------+--------+ POP Distal 48                   monophasic         +-----------+--------+-----+--------+----------+--------+ TP Trunk   45                   monophasic         +-----------+--------+-----+--------+----------+--------+ ATA Distal 23                   monophasic         +-----------+--------+-----+--------+----------+--------+ PTA Distal 27                   monophasic         +-----------+--------+-----+--------+----------+--------+ PERO Distal23                   monophasic         +-----------+--------+-----+--------+----------+--------+ Poor visualization of the abdomen. The bilateral   common  iliac arteries were not able to be visualized at all on today's study. Monophasic waveforms in the bilateral external iliac arteries suggest severe stenosis and/or occlusion of the common iliac arteries.  Summary:  Aorta-Iliacs: Technically challenging and limited evaluation. Monophasic waveforms in the bilateral external iliac arteries suggest severe stenosis and/or occlusion of the bilateral common iliac arteries. Unable to directly visualize the common iliac arteries due to patient body habitus and overlying bowel gas. Right: Near normal infrainguinal examination. No evidence of focal stenosis. Left: Near normal infrainguinal examination. No evidence of focal stenosis.  See table(s) above for measurements and observations. See arterial Doppler report. Vascular consult recommended. Electronically signed by Kathlyn Sacramento MD on 05/19/2020 at 2:43:29 PM.    Final     ASSESSMENT & PLAN:  Kelli Horn is a 64 y.o. 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 She was found to have 2 small lesions in her left breast with carcinoma involving papillary lesions. More definitive type and staging will be obtained from surgical pathology. Given symptomatic right breast changes her right breast was also biopsied and path showed complex sclerotic lesion with calcifications.  -She is a candidate for breast conservation surgery. She has been seen by breast surgeon Dr. Marlou Starks who recommends bilateral lumpectomy and Left Sentinel LN Biopsy.  -If this is DCIS alone, this will be cured by complete surgical resection. Any form of adjuvant therapy is preventive.  -If found to be invasive cancer, this is likely very early stage cancer, she will unlikely need adjuvant chemotherapy. If invasive component in surgical sample is greater than expected,  will obtain Oncotype Dx testing to determine benefit of chemotherapy.  -Given her strongly positive ER and PR, I do recommend antiestrogen therapy. I briefly reviewed side effects with her. She is interested.  -She will likely benefit from breast radiation if she undergo lumpectomy to decrease the risk of future breast cancer. She will discuss this further with Dr Isidore Moos today.  -We also discussed that biopsy may have sampling limitation, we will review her surgical path, to see if she has any invasive carcinoma components. -We discussed breast cancer surveillance after she completes treatment, Including annual mammogram, breast exam every 6-12 months. -F/u after surgery or Radiation   2. Comorbidites: HTN, DM, DDD/Arthritis, HLD, CAD s/p cardiac cath in 03/2020 -Managed on Amlodipine, baby aspirin, enalapril, Trelegy, Hydroxyzine, Nitroglycerin -Her arthritis is managed on ibuprofen and Robaxin.    PLAN:  -Proceed with surgery soon  -F/u after surgery or Radiation    No orders of the defined types were placed in this encounter.   All questions were answered. The patient knows to call the clinic with any problems, questions or concerns. The total time spent in the appointment was 45 minutes.     Truitt Merle, MD 05/22/2020 10:43 PM  I, Joslyn Devon, am acting as scribe for Truitt Merle, MD.   I have reviewed the above documentation for accuracy and completeness, and I agree with the above.

## 2020-05-22 NOTE — Progress Notes (Signed)
[facility] Psychosocial Distress Screening Spiritual Care  Met with Kelli Horn briefly in [facility] to introduce [place] team/resources, reviewing distress screen per protocol.  The patient scored a 2 on the Psychosocial Distress Thermometer which indicates mild distress.   ONCBCN DISTRESS SCREENING 05/22/2020  Distress experienced in past week (1-10) 2  Information Concerns Type Lack of info about diagnosis;Lack of info about treatment;Lack of info about maintaining fitness  Physical Problem type Getting around;Breathing  Referral to support programs Yes    Follow up needed: No. Kelli Horn declined to meet with Patient and Family Support representative, but received full packet of Kelli Horn team/programming resources.   [place], [place], [facility] Pager [phone] Voicemail [phone]

## 2020-05-22 NOTE — Therapy (Signed)
[place], [place], [address] Phone: [phone]   Fax:  [phone]  Physical Therapy Evaluation  Patient Details  Name: Kelli Horn MRN: [id number] Date of Birth: 18-Jan-1956 Referring Provider (PT): Dr. Autumn Messing   Encounter Date: 05/22/2020   PT End of Session - 05/22/20 1656    Visit Number 1    Number of Visits 2    Date for PT Re-Evaluation 07/17/20    PT Start Time 4742    PT Stop Time 5956   Also saw pt from 1510-1522 for a total of 43 min   PT Time Calculation (min) 31 min    Activity Tolerance Patient tolerated treatment well    Behavior During Therapy [facility] for tasks assessed/performed           Past Medical History:  Diagnosis Date  . Acid reflux   . Asthma   . Breast cancer ([place])   . Diabetes mellitus without complication ([place])   . Hyperlipidemia   . Hypertension     Past Surgical History:  Procedure Laterality Date  . APPENDECTOMY    . LEFT HEART CATH AND CORONARY ANGIOGRAPHY N/A 04/23/2020   Procedure: LEFT HEART CATH AND CORONARY ANGIOGRAPHY;  Surgeon: Martinique, Peter M, MD;  Location: [facility];  Service: Cardiovascular;  Laterality: N/A;    There were no vitals filed for this visit.    Subjective Assessment - 05/22/20 1644    Subjective Patient reports she is here today to be seen by her medical team for her newly diagnosed left breast cancer.    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 There are 2 areas in the lower outer quadrant measuring 9 mm and 4 mm. It is ER/PR positive. She has a complex sclerosing lesion in the right breast. She has diabetes, hypertension, severe vascular disease and COPD.    Patient Stated Goals Learn post op shoulder ROM HEP and lymphedema risk reduction    Currently in Pain? Yes    Pain Score 6     Pain Location  Hip    Pain Orientation Right    Pain Descriptors / Indicators Aching    Pain Type Chronic pain    Pain Onset More than a month ago    Pain Frequency Constant    Aggravating Factors  Sitting    Pain Relieving Factors Nothing              [facility] PT Assessment - 05/22/20 0001      Assessment   Medical Diagnosis Left breast cancer    Referring Provider (PT) Dr. Autumn Messing    Onset Date/Surgical Date 04/10/20    Hand Dominance Right    Prior Therapy none      Precautions   Precautions Other (comment)    Precaution Comments active cancer      Restrictions   Weight Bearing Restrictions No      Balance Screen   Has the patient fallen in the past 6 months No    Has the patient had a decrease in activity level because of a fear of falling?  No    Is the patient reluctant to leave their home because of a fear of falling?  No      Home Social worker Private residence    Living Arrangements Spouse/significant other;Children   Husband  and 7 y.o. son who has a disability   Available Help at Discharge Family      Prior Function   Level of [place] Retired    Leisure She does not exercise      Cognition   Overall Cognitive Status Within Functional Limits for tasks assessed      Posture/Postural Control   Posture/Postural Control Postural limitations    Postural Limitations Rounded Shoulders;Forward head;Increased thoracic kyphosis;Flexed trunk      ROM / Strength   AROM / PROM / Strength AROM;Strength      AROM   Overall AROM Comments Cervical extension and bilateral sidebending limited 75%; flexion and bilateral rotation are WNL    AROM Assessment Site Shoulder    Right/Left Shoulder Right;Left    Right Shoulder Extension 43 Degrees    Right Shoulder Flexion 107 Degrees    Right Shoulder ABduction 113 Degrees    Right Shoulder Internal Rotation 59 Degrees    Right Shoulder External Rotation 69 Degrees    Left Shoulder  Extension 40 Degrees    Left Shoulder Flexion 94 Degrees    Left Shoulder ABduction 98 Degrees    Left Shoulder Internal Rotation 56 Degrees    Left Shoulder External Rotation 70 Degrees      Strength   Overall Strength Unable to assess;Other (comment)   Due to limited ROM            LYMPHEDEMA/ONCOLOGY QUESTIONNAIRE - 05/22/20 0001      Type   Cancer Type Left breast      Lymphedema Assessments   Lymphedema Assessments Upper extremities      Right Upper Extremity Lymphedema   10 cm Proximal to Olecranon Process 43 cm    Olecranon Process 29.8 cm    10 cm Proximal to Ulnar Styloid Process 27.4 cm    Just Proximal to Ulnar Styloid Process 18.7 cm    Across Hand at [employer] 19.1 cm    At [place] of 2nd Digit 6.6 cm      Left Upper Extremity Lymphedema   10 cm Proximal to Olecranon Process 39.5 cm    Olecranon Process 30.2 cm    10 cm Proximal to Ulnar Styloid Process 26.3 cm    Just Proximal to Ulnar Styloid Process 17.8 cm    Across Hand at [employer] 18.8 cm    At [place] of 2nd Digit 6.1 cm           L-DEX FLOWSHEETS - 05/22/20 1600      L-DEX LYMPHEDEMA SCREENING   Measurement Type Unilateral    L-DEX MEASUREMENT EXTREMITY Upper Extremity    POSITION  Standing    DOMINANT SIDE Right    At Risk Side Left    BASELINE SCORE (UNILATERAL) 0           The patient was assessed using the L-Dex machine today to produce a lymphedema index baseline score. The patient will be reassessed on a regular basis (typically every 3 months) to obtain new L-Dex scores. If the score is > 6.5 points away from his/her baseline score indicating onset of subclinical lymphedema, it will be recommended to wear a compression garment for 4 weeks, 12 hours per day and then be reassessed. If the score continues to be > 6.5 points from baseline at reassessment, we will initiate lymphedema treatment. Assessing in this manner has a 95% rate of preventing clinically significant  lymphedema.  Katina Dung - 05/22/20 0001    Open a tight or new jar Moderate difficulty    Do heavy household chores (wash walls, wash floors) Unable    Carry a shopping bag or briefcase Moderate difficulty    Wash your back Unable    Use a knife to cut food No difficulty    Recreational activities in which you take some force or impact through your arm, shoulder, or hand (golf, hammering, tennis) Unable    During the past week, to what extent has your arm, shoulder or hand problem interfered with your normal social activities with family, friends, neighbors, or groups? Not at all    During the past week, to what extent has your arm, shoulder or hand problem limited your work or other regular daily activities Slightly    Arm, shoulder, or hand pain. Mild    Tingling (pins and needles) in your arm, shoulder, or hand Mild    Difficulty Sleeping No difficulty    DASH Score 43.18 %            Objective measurements completed on examination: See above findings.      Patient was instructed today in a home exercise program today for post op shoulder range of motion. These included active assist shoulder flexion in sitting, scapular retraction, wall walking with shoulder abduction, and hands behind head external rotation.  She was encouraged to do these twice a day, holding 3 seconds and repeating 5 times when permitted by her physician.             PT Education - 05/22/20 1654    Education Details Lymphedema risk reduction and post op shoulder ROM HEP    Person(s) Educated Patient;Other (comment)   Mother-in-law   Methods Explanation;Demonstration;Handout    Comprehension Returned demonstration;Verbalized understanding               PT Long Term Goals - 05/22/20 1702      PT LONG TERM GOAL #1   Title Patient will demonstrate she has regained full shoulder ROM and function post operatively compared to baselines.    Time 8    Period Weeks    Status New    Target  Date 07/17/20           Breast Clinic Goals - 05/22/20 1702      Patient will be able to verbalize understanding of pertinent lymphedema risk reduction practices relevant to her diagnosis specifically related to skin care.   Time 1    Period Days    Status Achieved      Patient will be able to return demonstrate and/or verbalize understanding of the post-op home exercise program related to regaining shoulder range of motion.   Time 1    Period Days    Status Achieved      Patient will be able to verbalize understanding of the importance of attending the postoperative After Breast Cancer Class for further lymphedema risk reduction education and therapeutic exercise.   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 There are 2 areas in the lower outer quadrant measuring 9 mm and 4 mm. It is ER/PR positive. She has a complex sclerosing lesion in the right breast. She has  diabetes, hypertension, severe vascular disease and COPD. Her multidisciplinary medical team met prior to her assessments to determine a recommended treatment plan. She is planning to have a bilateral lumpectomy and a left sentinel node biopsy followed by radiation and anti-estrogen therapy. She will benefit from a post op PT reassessment to determine needs and from L-Dex screens every 3 months for 2 years to detect subclinical lymphedema.    Stability/Clinical Decision Making Stable/Uncomplicated    Clinical Decision Making Low    Rehab Potential Excellent    PT Frequency --   Eval and 1 f/u visit   PT Treatment/Interventions ADLs/Self Care Home Management;Therapeutic exercise;Patient/family education    PT Next Visit Plan Will reassess 3-4 weeks post op to determine needs    PT Home Exercise Plan Post op shoulder ROM HEP    Consulted and Agree with  Plan of Care Patient;Family member/caregiver    Family Member Consulted Mother-in-law           Patient will benefit from skilled therapeutic intervention in order to improve the following deficits and impairments:  Postural dysfunction, Decreased range of motion, Impaired UE functional use, Pain, Decreased knowledge of precautions  Visit Diagnosis: Ductal carcinoma in situ (DCIS) of left breast - Plan: PT plan of care cert/re-cert  Abnormal posture - Plan: PT plan of care cert/re-cert  Stiffness of left shoulder, not elsewhere classified - Plan: PT plan of care cert/re-cert   Patient will follow up at outpatient cancer rehab 3-4 weeks following surgery.  If the patient requires physical therapy at that time, a specific plan will be dictated and sent to the referring physician for approval. The patient was educated today on appropriate basic range of motion exercises to begin post operatively and the importance of attending the After Breast Cancer class following surgery.  Patient was educated today on lymphedema risk reduction practices as it pertains to recommendations that will benefit the patient immediately following surgery.  She verbalized good understanding.      Problem List Patient Active Problem List   Diagnosis Date Noted  . 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 PAD (peripheral artery disease) ([place]) 04/23/2020  . Upper back pain 03/24/2016  . Left shoulder pain 03/24/2016  . Hypersomnia with sleep apnea 02/13/2016  . 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 Morbid obesity due to excess calories ([place]) 02/13/2016  . 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 Weakness of both legs 02/13/2016  . Angina syndrome, abdominal ([place]) 02/13/2016  . History of progressive weakness 01/10/2016  . Unilateral facial pain 01/10/2016  . Confusion 01/10/2016  . Gait instability 01/10/2016  . Chest pain 12/11/2015  . Hypertension  12/11/2015  . Asthma 12/11/2015  . Normocytic anemia 12/11/2015  . Leukocytosis 12/11/2015  . Prediabetes 12/11/2015  . Unstable angina ([place]) 12/11/2015  . Chest pain with moderate risk for cardiac etiology   . Dyslipidemia   . Pain in the chest    Annia Friendly, PT 05/22/20 5:05 PM  [place], [place], [address] Phone: [phone]   Fax:  [phone]  Name: Kelli Horn MRN: [id number] Date of Birth: 1955/12/02

## 2020-05-22 NOTE — Patient Instructions (Signed)

## 2020-05-22 NOTE — Progress Notes (Signed)
Cardiology Office Note   Date:  05/30/2020   ID:  Kelli Horn, DOB 1955/12/24, MRN [id number]  PCP:  [name], MD  Cardiologist:   Adasha Boehme Martinique, MD   Chief Complaint  Patient presents with  . Hypertension  . Hyperlipidemia  . Coronary Artery Disease      History of Present Illness: Kelli Horn is a 64 y.o. female who is seen for follow up  SOB. She has a history of DM, HLD,  and HTN. She was seen in the past by Dr Debara Pickett in 2017. Myoview at that time was normal. Echo showed severe LVH with normal systolic function and normal valves. Ecg was normal.   She reports that she has been SOB for years. This has progressed recently and is now severe. CXR was normal. No improvement with albuterol- this just makes her feel jittery. She describes a severe burning discomfort in her right breast. She also has severe burning pain in her left anterior chest associated with nausea, SOB and sweating. This occurs with any activity including walking across the room. Initially this would improve with rest but now it may take over an hour to go away. Hasn't really tried sl Ntg. Feels very shaky. BP has been running high. She has been reluctant to take Crestor because she states it causes severe lower abdominal cramping and diarrhea.   Other studies of note include plain X rays dating back to 2017 that showed significant aortic atherosclerosis. She recently had Abdominal/pelvic CT on 04/05/20 showing severe bilateral common iliac stenosis. 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 There was medical renal disease. She has an [name] report apparently showing a 2.9 cm gallstone but no gallstone noted on CT.  We evaluated her with cardiac cath showing mild nonobstructive CAD. No significant right subclavian/innominate gradient. Mildly elevated LVEDP. Echo showed mild LVH with normal systolic function. Arterial doppler studies show severe bilateral iliac disease with  moderate bilateral reduced ABIs and reduced toe pressures. No significant carotid disease but bilateral subclavian disease noted. She did have PFTs showing severe obstructive disease. Pulmonary evaluation was done who felt she had predominantly asthma. Was started on Trelegy and she notes significant improvement in her breathing.   She has also recently been diagnosed with left breast carcinoma. Scheduled for surgery next week with Dr Jene Every. She has seen Dr [name] yesterday for her PAD and angiogram planned for the end of October. She is scheduled to see Dr Michail Sermon in October as well. She had uterine biopsy yesterday- results pending.      Past Medical History:  Diagnosis Date  . Acid reflux   . Anxiety   . Asthma   . 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 COPD (chronic obstructive pulmonary disease) ([place])   . Hyperlipidemia   . Hypertension   . Pre-diabetes     Past Surgical History:  Procedure Laterality Date  . APPENDECTOMY    . LEFT HEART CATH AND CORONARY ANGIOGRAPHY N/A 04/23/2020   Procedure: LEFT HEART CATH AND CORONARY ANGIOGRAPHY;  Surgeon: Martinique, Meeya Goldin M, MD;  Location: [facility];  Service: Cardiovascular;  Laterality: N/A;     Current Outpatient Medications  Medication Sig Dispense Refill  . albuterol (PROVENTIL HFA;VENTOLIN HFA) 108 (90 Base) MCG/ACT inhaler Inhale 2 puffs into the lungs every 6 (six) hours as needed for wheezing or shortness of breath.     Marland Kitchen albuterol (PROVENTIL) (2.5 MG/3ML) 0.083% nebulizer  solution Take 2.5 mg by nebulization every 6 (six) hours as needed for shortness of breath.    Marland Kitchen amLODipine (NORVASC) 5 MG tablet Take 1 tablet (5 mg total) by mouth daily. 30 tablet 11  . amoxicillin (AMOXIL) 500 MG capsule Take 500 mg by mouth 3 (three) times daily.    Marland Kitchen aspirin EC 81 MG tablet Take 81 mg by mouth daily.    Marland Kitchen b complex vitamins tablet Take 1 tablet by mouth daily as needed (vertigo).    . enalapril (VASOTEC) 10 MG tablet  Take 15 mg by mouth 2 (two) times daily as needed (high bp).     . Fluticasone-Umeclidin-Vilant (TRELEGY ELLIPTA) 100-62.5-25 MCG/INH AEPB Inhale 1 puff into the lungs daily. 1 each 6  . hydrOXYzine (VISTARIL) 25 MG capsule Take 25 mg by mouth 2 (two) times daily as needed for itching.     Marland Kitchen ibuprofen (ADVIL) 200 MG tablet Take 400 mg by mouth every 6 (six) hours as needed for headache or moderate pain.    . methocarbamol (ROBAXIN) 750 MG tablet Take 750 mg by mouth 2 (two) times daily as needed for muscle spasms.     . nitroGLYCERIN (NITROSTAT) 0.4 MG SL tablet Place 1 tablet (0.4 mg total) under the tongue every 5 (five) minutes as needed for chest pain. 30 tablet 0  . Polyethyl Glycol-Propyl Glycol (SYSTANE ULTRA) 0.4-0.3 % SOLN Place 1 drop into both eyes 3 (three) times daily as needed (dry eyes).     . Polyvinyl Alcohol-Povidone (REFRESH OP) Place 1 drop into both eyes 4 (four) times daily as needed (dry eyes).    . rosuvastatin (CRESTOR) 5 MG tablet Take 5 mg twice a week 30 tablet 6   Current Facility-Administered Medications  Medication Dose Route Frequency Provider Last Rate Last Admin  . sodium chloride flush (NS) 0.9 % injection 3 mL  3 mL Intravenous Q12H Martinique, Ylianna Almanzar M, MD        Allergies:   Carvedilol and Peanut-containing drug products    Social History:  The patient  reports that she quit smoking about 16 years ago. Her smoking use included cigarettes. She has a 20.00 pack-year smoking history. She has never used smokeless tobacco. She reports that she does not drink alcohol and does not use drugs.   Family History:  The patient's family history includes Aneurysm in her father and paternal uncle; Breast cancer in her maternal aunt; Diabetes in an other family member; Heart attack (age of onset: 40) in her father; Heart disease in her paternal uncle; Lung cancer in her mother.    ROS:  Please see the history of present illness.   Otherwise, review of systems are negative.    All other systems are reviewed and negative.    PHYSICAL EXAM: VS:  BP (!) 152/96   Pulse 86   Ht 5\' 10"  (1.778 m)   Wt 300 lb (136.1 kg)   LMP 12/09/2013   BMI 43.05 kg/m  , BMI Body mass index is 43.05 kg/m. GEN: Well nourished, morbidly obese, in no acute distress  HEENT: normal  Neck: no JVD,  Left carotid and bilateral subclavian bruits.  Cardiac: RRR; no murmurs, rubs, or gallops,no edema. Femoral and pedal pulses are not palpable.  Respiratory:  clear to auscultation bilaterally, normal work of breathing GI: morbidly obese with large panus. Soft, nontender, nondistended, + BS. I don't hear renal bruits MS: no deformity or atrophy  Skin: warm and dry, no rash Neuro:  Strength  and sensation are intact Psych: euthymic mood, full affect   EKG:  EKG is not ordered today.   Recent Labs: 05/22/2020: ALT 16; BUN 12; Creatinine 0.82; Hemoglobin 13.7; Platelet Count 415; Potassium 4.2; Sodium 137    Lipid Panel    Component Value Date/Time   CHOL 196 04/19/2020 1144   TRIG 154 (H) 04/19/2020 1144   HDL 35 (L) 04/19/2020 1144   CHOLHDL 5.6 (H) 04/19/2020 1144   CHOLHDL 6.1 12/11/2015 0246   VLDL 20 12/11/2015 0246   LDLCALC 133 (H) 04/19/2020 1144    Additional labs from Dr Arelia Sneddon: dated 02/07/20: D dimer normal. BNP 54. Glucose 118. UA 7.4. otherwise CMET normal. CBC normal Hgb 14.7. TFTS normal. Cholesterol 207, triglycerides 137, HDL 40, LDL 142.  Urinalysis normal.  Wt Readings from Last 3 Encounters:  05/30/20 300 lb (136.1 kg)  05/29/20 298 lb (135.2 kg)  05/22/20 295 lb 11.2 oz (134.1 kg)      Other studies Reviewed: Additional studies/ records that were reviewed today include:   Myoview 12/26/15: Study Highlights   Nuclear stress EF: 62%.  There was no ST segment deviation noted during stress.  No T wave inversion was noted during stress.  The study is normal.  This is a low risk study.  The left ventricular ejection fraction is normal (55-65%).    Echo 12/11/15: Study Conclusions   - Left ventricle: The cavity size was normal. Wall thickness was  increased in a pattern of severe LVH. Systolic function was  normal. The estimated ejection fraction was in the range of 60%  to 65%. Doppler parameters are consistent with abnormal left  ventricular relaxation (grade 1 diastolic dysfunction). The E/e&'  ratio is between 8-15, suggesting indeterminate LV filling  pressure.  - Aortic valve: Trileaflet. Sclerosis without stenosis. There was  no regurgitation.  - Mitral valve: Calcified annulus. There was trivial regurgitation.  - Left atrium: The atrium was mildly dilated.  - Tricuspid valve: There was trivial regurgitation.  - Pulmonary arteries: PA peak pressure: 32 mm Hg (S).  - Inferior vena cava: The vessel was dilated. The respirophasic  diameter changes were blunted (< 50%), consistent with elevated  central venous pressure.   Cardiac cath 04/23/20:  LEFT HEART CATH AND CORONARY ANGIOGRAPHY  Conclusion    Mid RCA lesion is 25% stenosed.  Mid LAD lesion is 25% stenosed.  The left ventricular systolic function is normal.  LV end diastolic pressure is mildly elevated.  The left ventricular ejection fraction is greater than 65% by visual estimate.   1. Nonobstructive CAD 2. Normal LV function 3. Mildly elevated LVEDP 4. Hypertensive 5. No significant right innominate or subclavian pressure gradient.   Plan: will optimize BP control. Will arrange PFTs, Echo, Carotid and LE arterial dopplers. She is intolerant of beta blockers. Will initiate amlodipine in addition to enalapril for BP control.   Echo 04/30/20: IMPRESSIONS    1. Left ventricular ejection fraction, by estimation, is 55 to 60%. The  left ventricle has normal function. The left ventricle has no regional  wall motion abnormalities. There is mild concentric left ventricular  hypertrophy. Left ventricular diastolic  parameters are  indeterminate. Elevated left ventricular end-diastolic  pressure.  2. Right ventricular systolic function is normal. The right ventricular  size is normal. Tricuspid regurgitation signal is inadequate for assessing  PA pressure.  3. Left atrial size was mildly dilated.  4. Right atrial size was mildly dilated.  5. The mitral valve is normal in  structure. Trivial mitral valve  regurgitation. No evidence of mitral stenosis. Moderate mitral annular  calcification.  6. The aortic valve is tricuspid. There is mild calcification of the  aortic valve. There is mild thickening of the aortic valve. Aortic valve  regurgitation is not visualized. Mild aortic valve sclerosis is present,  with no evidence of aortic valve  stenosis.  7. The inferior vena cava is normal in size with greater than 50%  respiratory variability, suggesting right atrial pressure of 3 mmHg.   Conclusion(s)/Recommendation(s): Otherwise normal echocardiogram, with  minor abnormalities described in the report  LE arterial dopplers:  Aorta-Iliacs: Technically challenging and limited evaluation.   Monophasic waveforms in the bilateral external iliac arteries suggest  severe stenosis and/or occlusion of the bilateral common iliac arteries.  Unable to directly visualize the common iliac arteries due to patient body  habitus and overlying bowel gas.   Right: Near normal infrainguinal examination. No evidence of focal  stenosis.   Left: Near normal infrainguinal examination. No evidence of focal  stenosis.   Summary:  Right: Resting right ankle-brachial index indicates moderate right lower  extremity arterial disease. The right toe-brachial index is abnormal.   Left: Resting left ankle-brachial index indicates moderate left lower  extremity arterial disease. The left toe-brachial index is abnormal.   Carotid dopplers 05/16/20: Summary:  Right Carotid: Velocities in the right ICA are consistent with a 1-39%   stenosis.   Left Carotid: Velocities in the left ICA are consistent with a 40-59%  stenosis.   Vertebrals: Left vertebral artery demonstrates antegrade flow. Right  vertebral        artery demonstrates no discernable flow.  Subclavians: Left subclavian artery was stenotic. Right subclavian artery  flow        was disturbed.   *See table(s) above for measurements and observations.  Suggest follow up study in 12 months.   ASSESSMENT AND PLAN:  1.  Severe dyspnea secondary to severe asthma. Improved symptoms on Trelegy. Fortunately she has no significant obstructive CAD. 2. HTN. Continue enalapril and amlodipine.  3. HLD. Tolerating Crestor 5 mg once a week. Will try to increase to twice a week. If truly statin intolerant will need to consider alternate therapy such as PCSK 9 inhibitor.  4. Morbid obesity. She is planning to loose weight with more of a plant based diet.  5. PAD. Severe bilateral common iliac stenosis noted on CT.  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 Some bilateral subclavian disease but no gradient on the right with cath. Plan Aortogram and run off in October with Dr [name].  55. Strong family history of premature vascular disease. 7. Former tobacco abuse.  8. Intermittent abdominal pain, cramping, diarrhea and bleeding. To see Dr Michail Sermon.  9. Left breast carcinoma. She is cleared for surgery next week from a cardiac standpoint.  10. Nonobstructive CAD.  11. Uterine biopsy results pending.    Current medicines are reviewed at length with the patient today.  The patient does not have concerns regarding medicines.  The following changes have been made:  See above  Labs/ tests ordered today include:   No orders of the defined types were placed in this encounter.    Disposition:   FU with me 3 months. Signed, Susannah Carbin Martinique, MD  05/30/2020 9:26 AM    [facility] [address], [place], [place], [address] Phone [phone], Fax  [phone]

## 2020-05-24 ENCOUNTER — Telehealth: Payer: Self-pay | Admitting: Hematology

## 2020-05-24 ENCOUNTER — Telehealth: Payer: Self-pay | Admitting: *Deleted

## 2020-05-24 NOTE — Telephone Encounter (Signed)
No 9/22 los

## 2020-05-24 NOTE — Telephone Encounter (Signed)
   [facility] Pre-operative Risk Assessment      Request for surgical clearance:  1. What type of surgery is being performed? Breast surgery   2. When is this surgery scheduled? TBD   3. What type of clearance is required (medical clearance vs. Pharmacy clearance to hold med vs. Both)? medical  4. Are there any medications that need to be held prior to surgery and how long?  N/Kelli  5. Practice name and name of physician performing surgery? [facility] Dr. Marlou Starks   6. What is the office phone number? [phone]   7.   What is the office fax number? [phone]  8.   Anesthesia type (None, local, MAC, general) ? general   Kelli Horn Kelli Horn 05/24/2020, 5:19 PM  _________________________________________________________________

## 2020-05-27 ENCOUNTER — Telehealth: Payer: Self-pay | Admitting: Cardiology

## 2020-05-27 ENCOUNTER — Other Ambulatory Visit: Payer: Self-pay | Admitting: General Surgery

## 2020-05-27 ENCOUNTER — Telehealth: Payer: Self-pay | Admitting: Pulmonary Disease

## 2020-05-27 DIAGNOSIS — D0512 Intraductal carcinoma in situ of left breast: Secondary | ICD-10-CM

## 2020-05-27 NOTE — Telephone Encounter (Signed)
Kelli Horn is requesting a call from [facility] prior to her upcoming appointment with Dr. Martinique to update both her and Dr. Martinique on some things she has going on. Please advise.

## 2020-05-27 NOTE — Telephone Encounter (Signed)
Pt called to let Dr. Martinique know that she has recently been diagnosed with Left breast CA and will be needing surgical clearance for breast surgery. She is concerned that her OBGYN Dr. Marvel Plan... is planning to do a biopsy on cysts that were found on ultrasound even though her PAP was normal.. she is very reluctant to have the biopsy done with a normal PAP and is asking Dr. Martinique to find out more information for her.. I advised her that it may be good to talk with her PCP Dr. Arelia Sneddon and she agreed.   Pt will talk more with Dr. Martinique at her 05/30/20 follow up appt.

## 2020-05-27 NOTE — Telephone Encounter (Signed)
Patient had recent cardiac cath and echocardiogram and both were normal. PFT showed severe obstructive disease. Lower extremity arterial doppler showed severe bilateral iliac disease.   Talking with the patient today, she has been experiencing some sudden leg pain at rest and she is concerned of "blood clot". No significant worsening dyspnea noted recently. Otherwise, she denies any recent chest pain.   Will defer to Dr. Martinique for final clearance during upcoming office visit on 9/30. Callback pool to update upcoming visit to include cardiac clearance.

## 2020-05-28 NOTE — Telephone Encounter (Signed)
Pre op clearance added to reason for visit on 05/30/2020 appointment with Dr Martinique.

## 2020-05-28 NOTE — Telephone Encounter (Signed)
Routed to requesting surgeons office to make them aware.

## 2020-05-29 ENCOUNTER — Other Ambulatory Visit: Payer: Self-pay

## 2020-05-29 ENCOUNTER — Ambulatory Visit (INDEPENDENT_AMBULATORY_CARE_PROVIDER_SITE_OTHER): Payer: 59 | Admitting: Cardiovascular Disease

## 2020-05-29 ENCOUNTER — Ambulatory Visit: Payer: 59 | Admitting: Cardiology

## 2020-05-29 ENCOUNTER — Encounter: Payer: Self-pay | Admitting: Cardiovascular Disease

## 2020-05-29 ENCOUNTER — Encounter (HOSPITAL_BASED_OUTPATIENT_CLINIC_OR_DEPARTMENT_OTHER): Payer: Self-pay | Admitting: General Surgery

## 2020-05-29 DIAGNOSIS — I739 Peripheral vascular disease, unspecified: Secondary | ICD-10-CM

## 2020-05-29 DIAGNOSIS — I779 Disorder of arteries and arterioles, unspecified: Secondary | ICD-10-CM | POA: Insufficient documentation

## 2020-05-29 DIAGNOSIS — I6522 Occlusion and stenosis of left carotid artery: Secondary | ICD-10-CM

## 2020-05-29 NOTE — Progress Notes (Signed)
05/29/2020 Kelli Horn   12/30/1955  [id number]  Primary Physician [name], MD Primary Cardiologist: [name] MD Lupe Carney, [place]  HPI:  Kelli Horn is a 64 y.o. morbidly obese married Caucasian female mother of 2 children, grandmother of 1 grandchild referred by Dr. Martinique for peripheral vascular evaluation because of lifestyle limiting claudication.  Risk factors include 30 pack years of tobacco abuse having quit 10 to 15 years ago.  Treated hypertension, hyperlipidemia and untreated diabetes.  She is never had a heart attack or stroke.  She did have a recent heart catheterization performed by Dr. Martinique which showed no evidence of CAD.  She also has breast cancer and is scheduled to have surgery in the near future with radiation therapy after that.  She has been treated with for COPD and reactive airways disease as well.  She has had lifestyle limiting claudication for many years with recent Dopplers that show ABIs in the 0.6 range bilaterally with monophasic waveforms and an abdominal CTA performed 04/05/2020 that suggested severe aortoiliac disease.   Current Meds  Medication Sig  . albuterol (PROVENTIL HFA;VENTOLIN HFA) 108 (90 Base) MCG/ACT inhaler Inhale 2 puffs into the lungs every 6 (six) hours as needed for wheezing or shortness of breath.   Marland Kitchen albuterol (PROVENTIL) (2.5 MG/3ML) 0.083% nebulizer solution Take 2.5 mg by nebulization every 6 (six) hours as needed for shortness of breath.  Marland Kitchen amLODipine (NORVASC) 5 MG tablet Take 1 tablet (5 mg total) by mouth daily.  Marland Kitchen aspirin EC 81 MG tablet Take 81 mg by mouth daily.  Marland Kitchen b complex vitamins tablet Take 1 tablet by mouth daily as needed (vertigo).  . enalapril (VASOTEC) 10 MG tablet Take 15 mg by mouth 2 (two) times daily as needed (high bp).   . Fluticasone-Umeclidin-Vilant (TRELEGY ELLIPTA) 100-62.5-25 MCG/INH AEPB Inhale 1 puff into the lungs daily.  . hydrOXYzine (VISTARIL) 25 MG capsule Take 25 mg  by mouth 2 (two) times daily as needed for itching.   Marland Kitchen ibuprofen (ADVIL) 200 MG tablet Take 400 mg by mouth every 6 (six) hours as needed for headache or moderate pain.  . methocarbamol (ROBAXIN) 750 MG tablet Take 750 mg by mouth 2 (two) times daily as needed for muscle spasms.   . nitroGLYCERIN (NITROSTAT) 0.4 MG SL tablet Place 1 tablet (0.4 mg total) under the tongue every 5 (five) minutes as needed for chest pain.  Vladimir Faster Glycol-Propyl Glycol (SYSTANE ULTRA) 0.4-0.3 % SOLN Place 1 drop into both eyes 3 (three) times daily as needed (dry eyes).   . Polyvinyl Alcohol-Povidone (REFRESH OP) Place 1 drop into both eyes 4 (four) times daily as needed (dry eyes).  . rosuvastatin (CRESTOR) 5 MG tablet Take 5 mg once a week (Patient taking differently: Take 5 mg by mouth every Saturday. )   Current Facility-Administered Medications for the 05/29/20 encounter (Office Visit) with [name], MD  Medication  . sodium chloride flush (NS) 0.9 % injection 3 mL     Allergies  Allergen Reactions  . Carvedilol Shortness Of Breath    Reacts with albuterol, worsens asthma symptoms when taken together   . Peanut-Containing Drug Products     Asthma attack    Social History   Socioeconomic History  . Marital status: Married    Spouse name: Not on file  . Number of children: 2  . Years of education: Not on file  . Highest education level: Not on file  Occupational History  . Occupation: retired   Tobacco Use  . Smoking status: Former Smoker    Packs/day: 2.00    Years: 10.00    Pack years: 20.00    Types: Cigarettes    Quit date: 04/16/2004    Years since quitting: 16.1  . Smokeless tobacco: Never Used  Substance and Sexual Activity  . Alcohol use: No  . Drug use: No  . Sexual activity: Not Currently  Other Topics Concern  . Not on file  Social History Narrative  . Not on file   Social Determinants of Health   Financial Resource Strain:   . Difficulty of Paying Living  Expenses: Not on file  Food Insecurity:   . Worried About Charity fundraiser in the Last Year: Not on file  . Ran Out of Food in the Last Year: Not on file  Transportation Needs:   . Lack of Transportation (Medical): Not on file  . Lack of Transportation (Non-Medical): Not on file  Physical Activity:   . Days of Exercise per Week: Not on file  . Minutes of Exercise per Session: Not on file  Stress:   . Feeling of Stress : Not on file  Social Connections:   . Frequency of Communication with Friends and Family: Not on file  . Frequency of Social Gatherings with Friends and Family: Not on file  . Attends Religious Services: Not on file  . Active Member of Clubs or Organizations: Not on file  . Attends Archivist Meetings: Not on file  . Marital Status: Not on file  Intimate Partner Violence:   . Fear of Current or Ex-Partner: Not on file  . Emotionally Abused: Not on file  . Physically Abused: Not on file  . Sexually Abused: Not on file     Review of Systems: General: negative for chills, fever, night sweats or weight changes.  Cardiovascular: negative for chest pain, dyspnea on exertion, edema, orthopnea, palpitations, paroxysmal nocturnal dyspnea or shortness of breath Dermatological: negative for rash Respiratory: negative for cough or wheezing Urologic: negative for hematuria Abdominal: negative for nausea, vomiting, diarrhea, bright red blood per rectum, melena, or hematemesis Neurologic: negative for visual changes, syncope, or dizziness All other systems reviewed and are otherwise negative except as noted above.    Blood pressure 134/66, pulse 83, temperature 97.8 F (36.6 C), height 5\' 10"  (1.778 m), weight 298 lb (135.2 kg), last menstrual period 12/09/2013.  General appearance: alert and no distress Neck: no adenopathy, no JVD, supple, symmetrical, trachea midline, thyroid not enlarged, symmetric, no tenderness/mass/nodules and Bilateral carotid  bruits Lungs: clear to auscultation bilaterally Heart: Soft outflow tract murmur Extremities: extremities normal, atraumatic, no cyanosis or edema Pulses: Diminished pedal pulses Skin: Skin color, texture, turgor normal. No rashes or lesions Neurologic: Alert and oriented X 3, normal strength and tone. Normal symmetric reflexes. Normal coordination and gait  EKG sinus rhythm at 83 without ST or T wave changes.  I personally reviewed this EKG  ASSESSMENT AND PLAN:   PAD (peripheral artery disease) ([place]) Ms. Meda is referred to me by Dr. Martinique for symptomatic PAD.  She is had claudication for many years.  She recently had a left heart cath by Dr. Martinique revealing no evidence of CAD.  Lower extremity arterial Doppler studies performed 05/16/2020 revealed ABIs in the 0.6 range bilaterally with monophasic waveforms in both iliac arteries and below.  They could not adequately look at her iliacs because of bowel gas and her  size.  She did have a CTA however performed 04/05/2020 that mentions severe aortoiliac disease.  My plan is to perform abdominal aortography and bilateral iliac intervention if anatomically appropriate.  Carotid artery disease ([place]) Carotid Dopplers performed 05/16/2020 showed moderate left ICA stenosis.  This should be repeated on annual basis.      [name] MD FACP,FACC,FAHA, [facility] 05/29/2020 4:37 PM

## 2020-05-29 NOTE — Assessment & Plan Note (Signed)
Carotid Dopplers performed 05/16/2020 showed moderate left ICA stenosis.  This should be repeated on annual basis.

## 2020-05-29 NOTE — Assessment & Plan Note (Signed)
Kelli Horn is referred to me by Dr. Martinique for symptomatic PAD.  She is had claudication for many years.  She recently had a left heart cath by Dr. Martinique revealing no evidence of CAD.  Lower extremity arterial Doppler studies performed 05/16/2020 revealed ABIs in the 0.6 range bilaterally with monophasic waveforms in both iliac arteries and below.  They could not adequately look at her iliacs because of bowel gas and her size.  She did have a CTA however performed 04/05/2020 that mentions severe aortoiliac disease.  My plan is to perform abdominal aortography and bilateral iliac intervention if anatomically appropriate.

## 2020-05-29 NOTE — Patient Instructions (Addendum)
Medication Instructions:  The current medical regimen is effective;  continue present plan and medications.  *If you need a refill on your cardiac medications before your next appointment, please call your pharmacy*   Lab Work: CBC, BMET > come back for these, no lab appointment needed.  COVID TESTING: October 22nd @ 11:00- [address]  If you have labs (blood work) drawn today and your tests are completely normal, you will receive your results only by: Marland Kitchen MyChart Message (if you have MyChart) OR . A paper copy in the mail If you have any lab test that is abnormal or we need to change your treatment, we will call you to review the results.   Testing/Procedures: Your physician has requested that you have a peripheral vascular angiogram. This exam is performed at the hospital. During this exam IV contrast is used to look at arterial blood flow. Please review the information sheet given for details.  Your physician has requested that you have a lower or upper extremity arterial duplex (1 week post procedure). This test is an ultrasound of the arteries in the legs or arms. It looks at arterial blood flow in the legs and arms. Allow one hour for Lower and Upper Arterial scans. There are no restrictions or special instructions  Your physician has requested that you have an ankle brachial index (ABI)(1 week post procedure). During this test an ultrasound and blood pressure cuff are used to evaluate the arteries that supply the arms and legs with blood. Allow thirty minutes for this exam. There are no restrictions or special instructions.    Follow-Up: At [facility], you and your health needs are our priority.  As part of our continuing mission to provide you with exceptional heart care, we have created designated Provider Care Teams.  These Care Teams include your primary Cardiologist (physician) and Advanced Practice Providers (APPs -  Physician Assistants and Nurse Practitioners)  who all work together to provide you with the care you need, when you need it.  We recommend signing up for the patient portal called "MyChart".  Sign up information is provided on this After Visit Summary.  MyChart is used to connect with patients for Virtual Visits (Telemedicine).  Patients are able to view lab/test results, encounter notes, upcoming appointments, etc.  Non-urgent messages can be sent to your provider as well.   To learn more about what you can do with MyChart, go to [email or web address].    Your next appointment:   2 week(s)  The format for your next appointment:   In Person  Provider:   Quay Burow, MD   Other Instructions     [place] [address] Dept: [phone] Loc: [phone]  Kelli Horn  05/29/2020  You are scheduled for a Peripheral Angiogram on Monday, October 25 with Dr. Quay Burow.  1. Please arrive at the [facility] (Main Entrance A) at [facility]: [address] [place], [place] [address] at 7:30 AM (This time is two hours before your procedure to ensure your preparation). Free valet parking service is available.   Special note: Every effort is made to have your procedure done on time. Please understand that emergencies sometimes delay scheduled procedures.  2. Diet: Do not eat solid foods after midnight.  The patient may have clear liquids until 5am upon the day of the procedure.  3. Labs: You will need to have blood drawn,  CBC, BMET. Please come back to our office for this blood work. You do not need to be fasting.  4. Medication instructions in preparation for your procedure:   Contrast Allergy: No  On the morning of your procedure, take your Aspirin and any morning medicines NOT listed above.  You may use sips of water.  5. Plan for one night stay--bring personal belongings. 6. Bring a current list  of your medications and current insurance cards. 7. You MUST have a responsible person to drive you home. 8. Someone MUST be with you the first 24 hours after you arrive home or your discharge will be delayed. 9. Please wear clothes that are easy to get on and off and wear slip-on shoes.  Thank you for allowing [name] to care for you!   -- [facility]

## 2020-05-30 ENCOUNTER — Encounter: Payer: Self-pay | Admitting: Cardiology

## 2020-05-30 ENCOUNTER — Telehealth: Payer: Self-pay | Admitting: *Deleted

## 2020-05-30 ENCOUNTER — Encounter: Payer: Self-pay | Admitting: *Deleted

## 2020-05-30 ENCOUNTER — Ambulatory Visit (INDEPENDENT_AMBULATORY_CARE_PROVIDER_SITE_OTHER): Payer: 59 | Admitting: Cardiology

## 2020-05-30 VITALS — BP 152/96 | HR 86 | Ht 70.0 in | Wt 300.0 lb

## 2020-05-30 DIAGNOSIS — D0512 Intraductal carcinoma in situ of left breast: Secondary | ICD-10-CM

## 2020-05-30 DIAGNOSIS — I6522 Occlusion and stenosis of left carotid artery: Secondary | ICD-10-CM | POA: Diagnosis not present

## 2020-05-30 DIAGNOSIS — E7800 Pure hypercholesterolemia, unspecified: Secondary | ICD-10-CM | POA: Diagnosis not present

## 2020-05-30 DIAGNOSIS — I10 Essential (primary) hypertension: Secondary | ICD-10-CM

## 2020-05-30 DIAGNOSIS — I739 Peripheral vascular disease, unspecified: Secondary | ICD-10-CM | POA: Diagnosis not present

## 2020-05-30 MED ORDER — ROSUVASTATIN CALCIUM 5 MG PO TABS: ORAL_TABLET | ORAL | 6 refills | Status: DC

## 2020-05-30 NOTE — Patient Instructions (Signed)
Take Crestor 5 mg twice a week  Continue your other therapy

## 2020-05-30 NOTE — Telephone Encounter (Signed)
I will fax records to [facility] [place]

## 2020-05-30 NOTE — Telephone Encounter (Signed)
Spoke to pt concerning [place] from [phone]. Denies questions or concerns regarding dx or treatment care plan. Encourage pt to call with needs. Received verbal understanding. Contact information provided.

## 2020-05-31 ENCOUNTER — Institutional Professional Consult (permissible substitution): Payer: 59 | Admitting: Emergency Medicine

## 2020-05-31 ENCOUNTER — Other Ambulatory Visit (HOSPITAL_COMMUNITY): Payer: 59

## 2020-06-04 ENCOUNTER — Other Ambulatory Visit (HOSPITAL_COMMUNITY)
Admission: RE | Admit: 2020-06-04 | Discharge: 2020-06-04 | Disposition: A | Discharge status: Home / Self Care | Payer: 59 | Source: Ambulatory Visit | Attending: General Surgery | Admitting: General Surgery

## 2020-06-04 ENCOUNTER — Telehealth: Payer: Self-pay | Admitting: Cardiology

## 2020-06-04 ENCOUNTER — Ambulatory Visit (HOSPITAL_COMMUNITY): Payer: 59

## 2020-06-04 DIAGNOSIS — Z20822 Contact with and (suspected) exposure to covid-19: Secondary | ICD-10-CM | POA: Insufficient documentation

## 2020-06-04 DIAGNOSIS — Z01812 Encounter for preprocedural laboratory examination: Secondary | ICD-10-CM | POA: Insufficient documentation

## 2020-06-04 LAB — SARS CORONAVIRUS 2 (TAT 6-24 HRS): SARS Coronavirus 2: NEGATIVE

## 2020-06-04 NOTE — Telephone Encounter (Signed)
Patient is requesting to speak with Dr. Doug Sou nurse regarding test results and having a hysterectomy. Please call.

## 2020-06-04 NOTE — Telephone Encounter (Signed)
Spoke with the patient who states that she would like to speak with Malachy Mood. I asked if there was anything that I could assist her with, patient states that she would prefer to speak with Malachy Mood.

## 2020-06-04 NOTE — Telephone Encounter (Signed)
Spoke to [email or web address] the HST has been approved #[id number] 05/22/20-08/20/20 [place]

## 2020-06-04 NOTE — Progress Notes (Signed)
Nutrition  Patient identified by attending Breast Clinic on 05/22/20.  Patient was given nutrition packet with RD contact information by nurse navigator.    64 year old female with new diagnosis of breast cancer.  Planning bilateral lumpectomy with left sentinel node biopsy.  Planning radiation and antiestrogens.    Ht: 70 inches Wt: 300lb BMI: 43  Patient currently not at nutritional risk.  Please consult RD if changes in nutritional status occur.  Eain Mullendore B. Zenia Resides, [place], [place] Registered Dietitian [phone] (mobile)

## 2020-06-05 ENCOUNTER — Telehealth: Payer: Self-pay | Admitting: *Deleted

## 2020-06-05 NOTE — Telephone Encounter (Signed)
Called and spoke the patient regarding a referral. Patient give an for 10/15 at 9:45 am with Dr [name]. Gave the address and phone number of the clinic. Also gave the policy for mask and visitor.

## 2020-06-05 NOTE — Telephone Encounter (Signed)
Spoke to patient she wanted to let Dr.Jordan know she found out she has ca of uterus and she will need a hysterectomy.She is scheduled to have lower ext angiogram with Dr.Berry 06/24/20.She wanted to make sure it is safe to have 3 surgeries so close together. Dr.Jordan advised to cancel lower ext angiogram.Your PAD is longstanding and if you need a stent you will be placed on DAPT.He advised surgeons will have to coordinate the timing of the surgeries. Advised I will make Dr.Berry aware.

## 2020-06-05 NOTE — Telephone Encounter (Signed)
Clearance for hysterectomy should come from Dr Martinique. She should  prob have her hysterectomy prior to her PV procedure because if I stent something she'll need to be on DAPT for 3-6 months uninterrupted afterwards

## 2020-06-06 ENCOUNTER — Ambulatory Visit
Admission: RE | Admit: 2020-06-06 | Discharge: 2020-06-06 | Disposition: A | Discharge status: Home / Self Care | Payer: 59 | Source: Ambulatory Visit | Attending: General Surgery | Admitting: General Surgery

## 2020-06-06 ENCOUNTER — Encounter: Payer: Self-pay | Admitting: *Deleted

## 2020-06-06 ENCOUNTER — Other Ambulatory Visit: Payer: Self-pay

## 2020-06-06 ENCOUNTER — Telehealth: Payer: Self-pay | Admitting: Cardiology

## 2020-06-06 DIAGNOSIS — D0512 Intraductal carcinoma in situ of left breast: Secondary | ICD-10-CM

## 2020-06-06 MED ORDER — CHLORTHALIDONE 25 MG PO TABS: 25.0000 mg | ORAL_TABLET | Freq: Every day | ORAL | 3 refills | Status: DC

## 2020-06-06 NOTE — Telephone Encounter (Signed)
Spoke to patient Dr.Jordan's advice given.Advised to monitor B/P and call back if she continues to have swelling.

## 2020-06-06 NOTE — Telephone Encounter (Signed)
Returned call to patient of Dr. Martinique   She reported swelling in both feet that has been going on for 2 weeks and has progressed from just at night to throughout the day  Initially the swelling would resolve overnight but now is persistent even when she wakes. She reports the bottom of her feet are tender  She reports BP has been better since starting amlodipine - 140s/65 (sometimes lower)  She has gained 6lbs in 2 weeks - reports weight gain can be SE of Trelegy   She is concerned that one or both of amlodipine/crestor can be contributing to symptoms  Advised will send message to Dr. Cheri Kearns LPN to review and advise

## 2020-06-06 NOTE — Telephone Encounter (Signed)
Already spoke to patient 06/24/20 PV procedure cancelled.

## 2020-06-06 NOTE — Telephone Encounter (Signed)
Pt c/o medication issue:  1. Name of Medication: amLODipine (NORVASC) 5 MG tablet and rosuvastatin (CRESTOR) 5 MG tablet  2. How are you currently taking this medication (dosage and times per day)? As directed  3. Are you having a reaction (difficulty breathing--STAT)? yes  4. What is your medication issue? Patient states that she is having swelling in both feet. These medications are the only new medications she has been taking. She states she looked up the side effects and swelling can be possible with both of these medications. She states she has gained 6 pounds in the last two weeks. Not sure if that is from the swelling or not. She denied SOB but does take medication for it. The swelling was only happening at night but now has progressed to all day. Please advise.

## 2020-06-06 NOTE — Progress Notes (Signed)

## 2020-06-06 NOTE — Telephone Encounter (Signed)
Swelling could be related to amlodipine. I would reduce dose to 2.5 mg daily and start chlorthalidone 25 mg daily for BP and swelling. I don't think Crestor is responsible for these issues.  Zena Vitelli Martinique MD, [facility]

## 2020-06-07 ENCOUNTER — Ambulatory Visit
Admission: RE | Admit: 2020-06-07 | Discharge: 2020-06-07 | Disposition: A | Discharge status: Home / Self Care | Payer: 59 | Source: Ambulatory Visit | Attending: General Surgery | Admitting: General Surgery

## 2020-06-07 ENCOUNTER — Ambulatory Visit (HOSPITAL_BASED_OUTPATIENT_CLINIC_OR_DEPARTMENT_OTHER): Payer: 59 | Admitting: Anesthesiology

## 2020-06-07 ENCOUNTER — Encounter (HOSPITAL_BASED_OUTPATIENT_CLINIC_OR_DEPARTMENT_OTHER)
Admission: RE | Disposition: A | Discharge status: Home / Self Care | Payer: Self-pay | Source: Home / Self Care | Attending: General Surgery

## 2020-06-07 ENCOUNTER — Other Ambulatory Visit: Payer: Self-pay

## 2020-06-07 ENCOUNTER — Ambulatory Visit (HOSPITAL_COMMUNITY)
Admission: RE | Admit: 2020-06-07 | Discharge: 2020-06-07 | Disposition: A | Discharge status: Home / Self Care | Payer: 59 | Source: Ambulatory Visit | Attending: General Surgery | Admitting: General Surgery

## 2020-06-07 ENCOUNTER — Ambulatory Visit (HOSPITAL_BASED_OUTPATIENT_CLINIC_OR_DEPARTMENT_OTHER)
Admission: RE | Admit: 2020-06-07 | Discharge: 2020-06-07 | Disposition: A | Discharge status: Home / Self Care | Payer: 59 | Attending: General Surgery | Admitting: General Surgery

## 2020-06-07 ENCOUNTER — Encounter (HOSPITAL_BASED_OUTPATIENT_CLINIC_OR_DEPARTMENT_OTHER): Payer: Self-pay | Admitting: General Surgery

## 2020-06-07 DIAGNOSIS — N6011 Diffuse cystic mastopathy of right breast: Secondary | ICD-10-CM | POA: Diagnosis not present

## 2020-06-07 DIAGNOSIS — J449 Chronic obstructive pulmonary disease, unspecified: Secondary | ICD-10-CM | POA: Insufficient documentation

## 2020-06-07 DIAGNOSIS — D0512 Intraductal carcinoma in situ of left breast: Secondary | ICD-10-CM

## 2020-06-07 DIAGNOSIS — Z803 Family history of malignant neoplasm of breast: Secondary | ICD-10-CM | POA: Diagnosis not present

## 2020-06-07 DIAGNOSIS — Z8249 Family history of ischemic heart disease and other diseases of the circulatory system: Secondary | ICD-10-CM | POA: Diagnosis not present

## 2020-06-07 DIAGNOSIS — C50512 Malignant neoplasm of lower-outer quadrant of left female breast: Secondary | ICD-10-CM | POA: Insufficient documentation

## 2020-06-07 DIAGNOSIS — I10 Essential (primary) hypertension: Secondary | ICD-10-CM | POA: Diagnosis not present

## 2020-06-07 DIAGNOSIS — Z87891 Personal history of nicotine dependence: Secondary | ICD-10-CM | POA: Insufficient documentation

## 2020-06-07 DIAGNOSIS — N6489 Other specified disorders of breast: Secondary | ICD-10-CM | POA: Diagnosis present

## 2020-06-07 DIAGNOSIS — Z170 Estrogen receptor positive status [ER+]: Secondary | ICD-10-CM | POA: Insufficient documentation

## 2020-06-07 DIAGNOSIS — E1151 Type 2 diabetes mellitus with diabetic peripheral angiopathy without gangrene: Secondary | ICD-10-CM | POA: Diagnosis not present

## 2020-06-07 DIAGNOSIS — Z6841 Body Mass Index (BMI) 40.0 and over, adult: Secondary | ICD-10-CM | POA: Diagnosis not present

## 2020-06-07 DIAGNOSIS — Z833 Family history of diabetes mellitus: Secondary | ICD-10-CM | POA: Diagnosis not present

## 2020-06-07 HISTORY — DX: Chronic obstructive pulmonary disease, unspecified: J44.9

## 2020-06-07 HISTORY — PX: BREAST LUMPECTOMY WITH RADIOACTIVE SEED AND SENTINEL LYMPH NODE BIOPSY: SHX6550

## 2020-06-07 HISTORY — DX: Anxiety disorder, unspecified: F41.9

## 2020-06-07 SURGERY — BREAST LUMPECTOMY WITH RADIOACTIVE SEED AND SENTINEL LYMPH NODE BIOPSY
Anesthesia: General | Site: Breast | Laterality: Bilateral

## 2020-06-07 MED ORDER — PROPOFOL 10 MG/ML IV BOLUS
INTRAVENOUS | Status: AC
  Filled 2020-06-07: qty 20

## 2020-06-07 MED ORDER — ACETAMINOPHEN 500 MG PO TABS
1000.0000 mg | ORAL_TABLET | ORAL | Status: AC
  Administered 2020-06-07: 1000 mg via ORAL

## 2020-06-07 MED ORDER — HYDROCODONE-ACETAMINOPHEN 5-325 MG PO TABS: 1.0000 | ORAL_TABLET | Freq: Four times a day (QID) | ORAL | 0 refills | Status: DC | PRN

## 2020-06-07 MED ORDER — FENTANYL CITRATE (PF) 100 MCG/2ML IJ SOLN
100.0000 ug | Freq: Once | INTRAMUSCULAR | Status: AC
  Administered 2020-06-07: 100 ug via INTRAVENOUS

## 2020-06-07 MED ORDER — AMISULPRIDE (ANTIEMETIC) 5 MG/2ML IV SOLN: 10.0000 mg | Freq: Once | INTRAVENOUS | Status: DC | PRN

## 2020-06-07 MED ORDER — CEFAZOLIN SODIUM-DEXTROSE 2-4 GM/100ML-% IV SOLN
INTRAVENOUS | Status: AC
  Filled 2020-06-07: qty 100

## 2020-06-07 MED ORDER — EPHEDRINE SULFATE 50 MG/ML IJ SOLN
INTRAMUSCULAR | Status: DC | PRN
  Administered 2020-06-07 (×2): 10 mg via INTRAVENOUS

## 2020-06-07 MED ORDER — PROPOFOL 10 MG/ML IV BOLUS
INTRAVENOUS | Status: DC | PRN
  Administered 2020-06-07: 100 mg via INTRAVENOUS

## 2020-06-07 MED ORDER — LACTATED RINGERS IV SOLN: INTRAVENOUS | Status: DC

## 2020-06-07 MED ORDER — OXYCODONE HCL 5 MG/5ML PO SOLN: 5.0000 mg | Freq: Once | ORAL | Status: DC | PRN

## 2020-06-07 MED ORDER — LIDOCAINE HCL (CARDIAC) PF 100 MG/5ML IV SOSY
PREFILLED_SYRINGE | INTRAVENOUS | Status: DC | PRN
  Administered 2020-06-07: 30 mg via INTRAVENOUS

## 2020-06-07 MED ORDER — OXYCODONE HCL 5 MG PO TABS: 5.0000 mg | ORAL_TABLET | Freq: Once | ORAL | Status: DC | PRN

## 2020-06-07 MED ORDER — ROPIVACAINE HCL 5 MG/ML IJ SOLN
INTRAMUSCULAR | Status: DC | PRN
  Administered 2020-06-07: 30 mL via PERINEURAL

## 2020-06-07 MED ORDER — ONDANSETRON HCL 4 MG/2ML IJ SOLN
INTRAMUSCULAR | Status: DC | PRN
  Administered 2020-06-07: 4 mg via INTRAVENOUS

## 2020-06-07 MED ORDER — ACETAMINOPHEN 500 MG PO TABS
ORAL_TABLET | ORAL | Status: AC
  Filled 2020-06-07: qty 2

## 2020-06-07 MED ORDER — GABAPENTIN 300 MG PO CAPS
ORAL_CAPSULE | ORAL | Status: AC
  Filled 2020-06-07: qty 1

## 2020-06-07 MED ORDER — FENTANYL CITRATE (PF) 100 MCG/2ML IJ SOLN
INTRAMUSCULAR | Status: DC | PRN
  Administered 2020-06-07 (×3): 25 ug via INTRAVENOUS

## 2020-06-07 MED ORDER — PHENYLEPHRINE HCL (PRESSORS) 10 MG/ML IV SOLN
INTRAVENOUS | Status: DC | PRN
  Administered 2020-06-07 (×2): 40 ug via INTRAVENOUS

## 2020-06-07 MED ORDER — FENTANYL CITRATE (PF) 100 MCG/2ML IJ SOLN
INTRAMUSCULAR | Status: AC
  Filled 2020-06-07: qty 2

## 2020-06-07 MED ORDER — MIDAZOLAM HCL 2 MG/2ML IJ SOLN
INTRAMUSCULAR | Status: AC
  Filled 2020-06-07: qty 2

## 2020-06-07 MED ORDER — MIDAZOLAM HCL 2 MG/2ML IJ SOLN
2.0000 mg | Freq: Once | INTRAMUSCULAR | Status: AC
  Administered 2020-06-07: 2 mg via INTRAVENOUS

## 2020-06-07 MED ORDER — TECHNETIUM TC 99M SULFUR COLLOID FILTERED
1.0000 | Freq: Once | INTRAVENOUS | Status: AC | PRN
  Administered 2020-06-07: 1 via INTRADERMAL

## 2020-06-07 MED ORDER — CHLORHEXIDINE GLUCONATE CLOTH 2 % EX PADS: 6.0000 | MEDICATED_PAD | Freq: Once | CUTANEOUS | Status: DC

## 2020-06-07 MED ORDER — HYDROMORPHONE HCL 1 MG/ML IJ SOLN: 0.2500 mg | INTRAMUSCULAR | Status: DC | PRN

## 2020-06-07 MED ORDER — GABAPENTIN 300 MG PO CAPS
300.0000 mg | ORAL_CAPSULE | ORAL | Status: AC
  Administered 2020-06-07: 300 mg via ORAL

## 2020-06-07 MED ORDER — MEPERIDINE HCL 25 MG/ML IJ SOLN: 6.2500 mg | INTRAMUSCULAR | Status: DC | PRN

## 2020-06-07 MED ORDER — PROPOFOL 500 MG/50ML IV EMUL
INTRAVENOUS | Status: DC | PRN
  Administered 2020-06-07: 25 ug/kg/min via INTRAVENOUS

## 2020-06-07 MED ORDER — CELECOXIB 200 MG PO CAPS
200.0000 mg | ORAL_CAPSULE | ORAL | Status: AC
  Administered 2020-06-07: 200 mg via ORAL

## 2020-06-07 MED ORDER — AMISULPRIDE (ANTIEMETIC) 5 MG/2ML IV SOLN
10.0000 mg | Freq: Once | INTRAVENOUS | Status: AC
  Administered 2020-06-07: 10 mg via INTRAVENOUS

## 2020-06-07 MED ORDER — BUPIVACAINE HCL (PF) 0.25 % IJ SOLN
INTRAMUSCULAR | Status: DC | PRN
  Administered 2020-06-07: 30 mL

## 2020-06-07 MED ORDER — CEFAZOLIN SODIUM-DEXTROSE 2-4 GM/100ML-% IV SOLN
2.0000 g | INTRAVENOUS | Status: AC
  Administered 2020-06-07: 2 g via INTRAVENOUS

## 2020-06-07 MED ORDER — CEFAZOLIN SODIUM-DEXTROSE 1-4 GM/50ML-% IV SOLN
INTRAVENOUS | Status: AC
  Filled 2020-06-07: qty 50

## 2020-06-07 MED ORDER — PROMETHAZINE HCL 25 MG/ML IJ SOLN: 6.2500 mg | INTRAMUSCULAR | Status: DC | PRN

## 2020-06-07 MED ORDER — AMISULPRIDE (ANTIEMETIC) 5 MG/2ML IV SOLN
INTRAVENOUS | Status: AC
  Filled 2020-06-07: qty 4

## 2020-06-07 MED ORDER — PHENYLEPHRINE 40 MCG/ML (10ML) SYRINGE FOR IV PUSH (FOR BLOOD PRESSURE SUPPORT)
PREFILLED_SYRINGE | INTRAVENOUS | Status: AC
  Filled 2020-06-07: qty 10

## 2020-06-07 MED ORDER — EPHEDRINE 5 MG/ML INJ
INTRAVENOUS | Status: AC
  Filled 2020-06-07: qty 10

## 2020-06-07 MED ORDER — CELECOXIB 200 MG PO CAPS
ORAL_CAPSULE | ORAL | Status: AC
  Filled 2020-06-07: qty 1

## 2020-06-07 SURGICAL SUPPLY — 48 items
ADH SKN CLS APL DERMABOND .7 (GAUZE/BANDAGES/DRESSINGS) ×2
APL PRP STRL LF DISP 70% ISPRP (MISCELLANEOUS) ×2
APPLIER CLIP 9.375 MED OPEN (MISCELLANEOUS) ×3
APR CLP MED 9.3 20 MLT OPN (MISCELLANEOUS) ×1
BLADE SURG 15 STRL LF DISP TIS (BLADE) ×1 IMPLANT
BLADE SURG 15 STRL SS (BLADE) ×3
CANISTER SUC SOCK COL 7IN (MISCELLANEOUS) IMPLANT
CANISTER SUCT 1200ML W/VALVE (MISCELLANEOUS) IMPLANT
CHLORAPREP W/TINT 26 (MISCELLANEOUS) ×5 IMPLANT
CLIP APPLIE 9.375 MED OPEN (MISCELLANEOUS) ×1 IMPLANT
COVER BACK TABLE 60X90IN (DRAPES) ×3 IMPLANT
COVER MAYO STAND STRL (DRAPES) ×3 IMPLANT
COVER PROBE W GEL 5X96 (DRAPES) ×3 IMPLANT
COVER WAND RF STERILE (DRAPES) IMPLANT
DECANTER SPIKE VIAL GLASS SM (MISCELLANEOUS) IMPLANT
DERMABOND ADVANCED (GAUZE/BANDAGES/DRESSINGS) ×4
DERMABOND ADVANCED .7 DNX12 (GAUZE/BANDAGES/DRESSINGS) ×1 IMPLANT
DRAPE LAPAROSCOPIC ABDOMINAL (DRAPES) ×3 IMPLANT
DRAPE UTILITY XL STRL (DRAPES) ×3 IMPLANT
ELECT COATED BLADE 2.86 ST (ELECTRODE) ×3 IMPLANT
ELECT REM PT RETURN 9FT ADLT (ELECTROSURGICAL) ×3
ELECTRODE REM PT RTRN 9FT ADLT (ELECTROSURGICAL) ×1 IMPLANT
GLOVE BIO SURGEON STRL SZ7.5 (GLOVE) ×3 IMPLANT
GOWN STRL REUS W/ TWL LRG LVL3 (GOWN DISPOSABLE) ×2 IMPLANT
GOWN STRL REUS W/TWL LRG LVL3 (GOWN DISPOSABLE) ×6
ILLUMINATOR WAVEGUIDE N/F (MISCELLANEOUS) IMPLANT
KIT MARKER MARGIN INK (KITS) ×3 IMPLANT
LIGHT WAVEGUIDE WIDE FLAT (MISCELLANEOUS) IMPLANT
NDL HYPO 25X1 1.5 SAFETY (NEEDLE) ×1 IMPLANT
NDL SAFETY ECLIPSE 18X1.5 (NEEDLE) IMPLANT
NEEDLE HYPO 18GX1.5 SHARP (NEEDLE)
NEEDLE HYPO 25X1 1.5 SAFETY (NEEDLE) ×3 IMPLANT
NS IRRIG 1000ML POUR BTL (IV SOLUTION) ×2 IMPLANT
PACK BASIN DAY SURGERY FS (CUSTOM PROCEDURE TRAY) ×3 IMPLANT
PENCIL SMOKE EVACUATOR (MISCELLANEOUS) ×3 IMPLANT
SLEEVE SCD COMPRESS KNEE MED (MISCELLANEOUS) ×3 IMPLANT
SPONGE LAP 18X18 RF (DISPOSABLE) ×7 IMPLANT
SUT MNCRL AB 4-0 PS2 18 (SUTURE) ×2 IMPLANT
SUT MON AB 4-0 PC3 18 (SUTURE) ×6 IMPLANT
SUT SILK 2 0 SH (SUTURE) IMPLANT
SUT VICRYL 3-0 CR8 SH (SUTURE) ×5 IMPLANT
SUT VICRYL AB 3 0 TIES (SUTURE) ×4 IMPLANT
SYR CONTROL 10ML LL (SYRINGE) ×3 IMPLANT
TOWEL GREEN STERILE FF (TOWEL DISPOSABLE) ×3 IMPLANT
TRAY FAXITRON CT DISP (TRAY / TRAY PROCEDURE) ×5 IMPLANT
TUBE CONNECTING 20'X1/4 (TUBING) ×1
TUBE CONNECTING 20X1/4 (TUBING) ×1 IMPLANT
YANKAUER SUCT BULB TIP NO VENT (SUCTIONS) ×2 IMPLANT

## 2020-06-07 NOTE — Anesthesia Procedure Notes (Signed)
Procedure Name: LMA Insertion Date/Time: 06/07/2020 9:04 AM Performed by: Signe Colt, CRNA Pre-anesthesia Checklist: Patient identified, Emergency Drugs available, Suction available and Patient being monitored Patient Re-evaluated:Patient Re-evaluated prior to induction Oxygen Delivery Method: Circle System Utilized Preoxygenation: Pre-oxygenation with 100% oxygen Induction Type: IV induction Ventilation: Mask ventilation without difficulty LMA: LMA inserted LMA Size: 4.0 Number of attempts: 1 Airway Equipment and Method: bite block Placement Confirmation: positive ETCO2 Tube secured with: Tape Dental Injury: Teeth and Oropharynx as per pre-operative assessment

## 2020-06-07 NOTE — Progress Notes (Signed)
Assisted Dr. Miller with left, ultrasound guided, pectoralis block. Side rails up, monitors on throughout procedure. See vital signs in flow sheet. Tolerated Procedure well. 

## 2020-06-07 NOTE — Transfer of Care (Signed)
Immediate Anesthesia Transfer of Care Note  Patient: Kelli Horn  Procedure(s) Performed: BILATERAL BREAST LUMPECTOMY WITH RADIOACTIVE SEED AND LEFT SENTINEL LYMPH NODE BIOPSY (Bilateral Breast)  Patient Location: PACU  Anesthesia Type:GA combined with regional for post-op pain  Level of Consciousness: drowsy and patient cooperative  Airway & Oxygen Therapy: Patient Spontanous Breathing and Patient connected to face mask oxygen  Post-op Assessment: Report given to RN and Post -op Vital signs reviewed and stable  Post vital signs: Reviewed and stable  Last Vitals:  Vitals Value Taken Time  BP    Temp    Pulse 88 06/07/20 1048  Resp 15 06/07/20 1048  SpO2 98 % 06/07/20 1048  Vitals shown include unvalidated device data.  Last Pain:  Vitals:   06/07/20 0727  TempSrc: Oral  PainSc: 0-No pain      Patients Stated Pain Goal: 5 (27/12/92 9090)  Complications: No complications documented.

## 2020-06-07 NOTE — Discharge Instructions (Signed)
  Post Anesthesia Home Care Instructions  Activity: Get plenty of rest for the remainder of the day. A responsible individual must stay with you for 24 hours following the procedure.  For the next 24 hours, DO NOT: -Drive a car -Paediatric nurse -Drink alcoholic beverages -Take any medication unless instructed by your physician -Make any legal decisions or sign important papers.  Meals: Start with liquid foods such as gelatin or soup. Progress to regular foods as tolerated. Avoid greasy, spicy, heavy foods. If nausea and/or vomiting occur, drink only clear liquids until the nausea and/or vomiting subsides. Call your physician if vomiting continues.  Special Instructions/Symptoms: Your throat may feel dry or sore from the anesthesia or the breathing tube placed in your throat during surgery. If this causes discomfort, gargle with warm salt water. The discomfort should disappear within 24 hours.  May take Tylenol and Ibuprofen after 2:40, if needed.

## 2020-06-07 NOTE — Op Note (Signed)
06/07/2020  10:40 AM  PATIENT:  Kelli Horn  64 y.o. female  PRE-OPERATIVE DIAGNOSIS:  left breast cancer, right breast complex sclerosing lesion  POST-OPERATIVE DIAGNOSIS:  left breast cancer, right breast complex sclerosing lesion  PROCEDURE:  Procedure(s) with comments: BILATERAL BREAST LUMPECTOMY WITH RADIOACTIVE SEED LOCALIZATION  AND LEFT DEEP AXILLARY SENTINEL LYMPH NODE BIOPSY (Bilateral) - PEC BLOCK  SURGEON:  Surgeon(s) and Role:    * [name], MD - Primary  PHYSICIAN ASSISTANT:   ASSISTANTS: none   ANESTHESIA:   local and general  EBL:  minimal   BLOOD ADMINISTERED:none  DRAINS: none   LOCAL MEDICATIONS USED:  MARCAINE     SPECIMEN:  Source of Specimen:  right breast tissue, left breast tissue, sentinel node  DISPOSITION OF SPECIMEN:  PATHOLOGY  COUNTS:  YES  TOURNIQUET:  * No tourniquets in log *  DICTATION: .Dragon Dictation   After informed consent was obtained the patient was brought to the operating room and placed in the supine position on the operating table. After adequate induction of general anesthesia the patient's bilateral chest, breast, and axillary areas were prepped with ChloraPrep, allowed to dry, and draped in usual sterile manner. An appropriate timeout was performed. Previously an I-125 seed was placed on the right breast laterally to mark an area of complex sclerosing lesion. She also had an I-125 seed placed in the left breast laterally to mark an area of invasive cancer. Earlier in the day the patient underwent injection of 1 mCi of technetium sulfur colloid in the subareolar position on the left. Attention was first turned to the right breast. The neoprobe was set to I-125 in the area of radioactivity was readily identified. A curvilinear incision was made in the lateral right breast overlying the area of radioactivity with a 15 blade knife. The incision was carried through the skin and subcutaneous tissue sharply with the  electrocautery. Dissection was then carried towards the radioactive seed under the direction of the neoprobe. Once I more closely approached the radioactive seed I then removed a circular portion of breast tissue sharply with the electrocautery around the radioactive seed while checking the area of radioactivity frequently. Once the specimen was removed it was oriented with the appropriate paint colors. A specimen radiograph showed the clip and seed to be within the specimen. The specimen was then sent to pathology for further evaluation. Hemostasis was achieved using the Bovie electrocautery. The wound was irrigated with saline and infiltrated with more quarter percent Marcaine. The deep layer of the wound was closed with layers of interrupted 3-0 Vicryl stitches. The skin was closed with a running 4-0 Monocryl subcuticular stitch. Attention was then turned to the left breast. The neoprobe was first set to technetium and an area of radioactivity was readily identified in the left axilla. The area overlying this was infiltrated with quarter percent Marcaine. A transversely oriented incision was made overlying the area of radioactivity in the axilla. The incision was carried through the skin and subcutaneous tissue sharply with the electrocautery until the deep left axillary space was entered. I was able to identify a hot lymph node with the help of the neoprobe. The lymph node was then dissected out by a combination of sharp dissection with the electrocautery and the surrounding lymphatics and small vessels were clamped with hemostats, divided, and ligated with 3-0 Vicryl ties. Once this node was removed it was checked and the ex vivo counts were approximately 500. No other hot or palpable nodes  were identified in the left axilla. Hemostasis was achieved using the Bovie electrocautery. The deep layer of the wound was then closed with interrupted 3-0 Vicryl stitches. The skin was closed with a running 4-0 Monocryl  subcuticular stitch. Attention was then turned to the left breast. The neoprobe was set to I-125 in the area of radioactivity was readily identified in the lower outer quadrant. The area around this was infiltrated with quarter percent Marcaine. An elliptical incision was made in the skin overlying the area of radioactivity with a 15 blade knife. The incision was carried through the skin and subcutaneous tissue sharply with the electrocautery. Dissection was then carried out widely around the radioactive seed while checking the area of radioactivity frequently. Once the specimen was removed it was oriented with the appropriate paint colors. A specimen radiograph was obtained that showed the clip and seed to be near the center of the specimen. The specimen was then sent to pathology for further evaluation. Hemostasis was achieved using the Bovie electrocautery. The wound was irrigated with saline and infiltrated with more quarter percent Marcaine. The deep layer of the wound was then closed with layers of interrupted 3-0 Vicryl stitches. The skin was closed with a running 4-0 Monocryl subcuticular stitch. Dermabond dressings were then applied. The patient tolerated the procedure well. At the end of the case all needle sponge and instrument counts were correct. The patient was then awakened and taken to recovery in stable condition.  PLAN OF CARE: Discharge to home after PACU  PATIENT DISPOSITION:  PACU - hemodynamically stable.   Delay start of Pharmacological VTE agent (>24hrs) due to surgical blood loss or risk of bleeding: not applicable

## 2020-06-07 NOTE — H&P (Signed)
Kelli Horn  Location: [facility] Surgery Patient #: [id number] DOB: [birth year] Undefined / Language: Cleophus Molt / Race: White Female   History of Present Illness  The patient is a 64 year old female who presents with breast cancer.We are asked to see the patient in consultation by Dr. Burr Medico to evaluate her for a new left breast cancer. The patient is a 64 year old white female who presents with a right breast assymetry and a left breast mass. 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 The left side showed 2 masses measuring 16mm and 58mm separated by 2 cm and the axilla looked neg. 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 She has some significant COPD and PVD. She is seeing vascular surgery for possible stents in her LE.   Past Surgical History  Appendectomy   Diagnostic Studies History  Colonoscopy  never Mammogram  within last year Pap Smear  1-5 years ago  Medication History  Medications Reconciled  Social History  Caffeine use  Coffee. No alcohol use  No drug use  Tobacco use  Former smoker.  Family History Breast Cancer  Family Members In General. Cerebrovascular Accident  Father. Diabetes Mellitus  Father. Heart Disease  Father. Heart disease in female family member before age 7  Hypertension  Father. Respiratory Condition  Mother. Seizure disorder  Son.  Pregnancy / Birth History  Age at menarche  5 years. Age of menopause  76-55 Gravida  3 Irregular periods  Length (months) of breastfeeding  3-6 Maternal age  32-20 Para  2  Other Problems  Asthma  Back Pain  Chronic Obstructive Lung Disease  Diabetes Mellitus  Gastroesophageal Reflux Disease  High blood pressure  Lump In Breast  Vascular Disease     Review of Systems  General Not Present- Appetite Loss, Chills, Fatigue, Fever, Night Sweats, Weight Gain and Weight Loss. Skin Not Present- Change in Wart/Mole, Dryness, Hives,  Jaundice, New Lesions, Non-Healing Wounds, Rash and Ulcer. HEENT Present- Seasonal Allergies and Wears glasses/contact lenses. Not Present- Earache, Hearing Loss, Hoarseness, Nose Bleed, Oral Ulcers, Ringing in the Ears, Sinus Pain, Sore Throat, Visual Disturbances and Yellow Eyes. Respiratory Present- Wheezing. Not Present- Bloody sputum, Chronic Cough, Difficulty Breathing and Snoring. Breast Not Present- Breast Mass, Breast Pain, Nipple Discharge and Skin Changes. Cardiovascular Present- Difficulty Breathing Lying Down and Leg Cramps. Not Present- Chest Pain, Palpitations, Rapid Heart Rate, Shortness of Breath and Swelling of Extremities. Gastrointestinal Present- Abdominal Pain and Bloating. Not Present- Bloody Stool, Change in Bowel Habits, Chronic diarrhea, Constipation, Difficulty Swallowing, Excessive gas, Gets full quickly at meals, Hemorrhoids, Indigestion, Nausea, Rectal Pain and Vomiting. Female Genitourinary Present- Pelvic Pain. Not Present- Frequency, Nocturia, Painful Urination and Urgency. Musculoskeletal Not Present- Back Pain, Joint Pain, Joint Stiffness, Muscle Pain, Muscle Weakness and Swelling of Extremities. Neurological Present- Trouble walking. Not Present- Decreased Memory, Fainting, Headaches, Numbness, Seizures, Tingling, Tremor and Weakness. Psychiatric Not Present- Anxiety, Bipolar, Change in Sleep Pattern, Depression, Fearful and Frequent crying. Endocrine Not Present- Cold Intolerance, Excessive Hunger, Hair Changes, Heat Intolerance, Hot flashes and New Diabetes. Hematology Not Present- Blood Thinners, Easy Bruising, Excessive bleeding, Gland problems, HIV and Persistent Infections.   Physical Exam General Mental Status-Alert. General Appearance-Consistent with stated age. Hydration-Well hydrated. Voice-Normal.  Head and Neck Head-normocephalic, atraumatic with no lesions or palpable masses. Trachea-midline. Thyroid Gland Characteristics - normal  size and consistency.  Eye Eyeball - Bilateral-Extraocular movements intact. Sclera/Conjunctiva - Bilateral-No scleral  icterus.  Chest and Lung Exam Chest and lung exam reveals -quiet, even and easy respiratory effort with no use of accessory muscles and on auscultation, normal breath sounds, no adventitious sounds and normal vocal resonance. Inspection Chest Wall - Normal. Back - normal.  Breast Note: there is no palpable mass in either breast. there is no palpable axillary, supraclavicular, or cervical lymphadenopathy   Cardiovascular Cardiovascular examination reveals -normal heart sounds, regular rate and rhythm with no murmurs and normal pedal pulses bilaterally.  Abdomen Inspection Inspection of the abdomen reveals - No Hernias. Skin - Scar - no surgical scars. Palpation/Percussion Palpation and Percussion of the abdomen reveal - Soft, Non Tender, No Rebound tenderness, No Rigidity (guarding) and No hepatosplenomegaly. Auscultation Auscultation of the abdomen reveals - Bowel sounds normal.  Neurologic Neurologic evaluation reveals -alert and oriented x 3 with no impairment of recent or remote memory. Mental Status-Normal.  Musculoskeletal Normal Exam - Left-Upper Extremity Strength Normal and Lower Extremity Strength Normal. Normal Exam - Right-Upper Extremity Strength Normal and Lower Extremity Strength Normal.  Lymphatic Head & Neck  General Head & Neck Lymphatics: Bilateral - Description - Normal. Axillary  General Axillary Region: Bilateral - Description - Normal. Tenderness - Non Tender. Femoral & Inguinal  Generalized Femoral & Inguinal Lymphatics: Bilateral - Description - Normal. Tenderness - Non Tender.    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 because of the risk of missing something more significant we would recommend having this area removed. She also has a stage 1 cancer in the LOQ of the left breast. I have discussed with her the options for treatment and at this point she favors bilateral lumpectomies. she is also a good candidate for sentinel node biopsy on the left. I have discussed with her in detail the risks and benefits of the surgery as well as some of the technical aspects including the use of radioactive seeds and she understands and wishes to proceed. she does not want clips left in after surgery. she will meet with medical and radiation oncology to discuss adjuvant therapy. This patient encounter took 60 minutes today to perform the following: take history, perform exam, review outside records, interpret imaging, counsel the patient on their diagnosis and document encounter, findings & plan in the EHR

## 2020-06-07 NOTE — Anesthesia Procedure Notes (Signed)
Anesthesia Regional Block: Pectoralis block   Pre-Anesthetic Checklist: ,, timeout performed, Correct Patient, Correct Site, Correct Laterality, Correct Procedure, Correct Position, site marked, Risks and benefits discussed,  Surgical consent,  Pre-op evaluation,  At surgeon's request and post-op pain management  Laterality: Left  Prep: chloraprep       Needles:  Injection technique: Single-shot  Needle Type: Stimiplex     Needle Length: 9cm  Needle Gauge: 21     Additional Needles:   Procedures:,,,, ultrasound used (permanent image in chart),,,,  Narrative:  Start time: 06/07/2020 8:02 AM End time: 06/07/2020 8:07 AM Injection made incrementally with aspirations every 5 mL.  Performed by: Personally  Anesthesiologist: Lynda Rainwater, MD

## 2020-06-07 NOTE — Interval H&P Note (Signed)
History and Physical Interval Note:  06/07/2020 8:38 AM  Kelli Horn  has presented today for surgery, with the diagnosis of left breast cancer, right breast complex sclerosing lesion.  The various methods of treatment have been discussed with the patient and family. After consideration of risks, benefits and other options for treatment, the patient has consented to  Procedure(s) with comments: BILATERAL BREAST LUMPECTOMY WITH RADIOACTIVE SEED AND LEFT SENTINEL LYMPH NODE BIOPSY (Bilateral) - PEC BLOCK as a surgical intervention.  The patient's history has been reviewed, patient examined, no change in status, stable for surgery.  I have reviewed the patient's chart and labs.  Questions were answered to the patient's satisfaction.     Autumn Messing III

## 2020-06-07 NOTE — Progress Notes (Signed)
Nuc med inj performed by nuc med staff. Pt tol well with no additional sedation. VSS, emotional support provided. 

## 2020-06-07 NOTE — Anesthesia Postprocedure Evaluation (Signed)
Anesthesia Post Note  Patient: Kelli Horn  Procedure(s) Performed: BILATERAL BREAST LUMPECTOMY WITH RADIOACTIVE SEED AND LEFT SENTINEL LYMPH NODE BIOPSY (Bilateral Breast)     Patient location during evaluation: PACU Anesthesia Type: General Level of consciousness: awake and alert Pain management: pain level controlled Vital Signs Assessment: post-procedure vital signs reviewed and stable Respiratory status: spontaneous breathing, nonlabored ventilation and respiratory function stable Cardiovascular status: blood pressure returned to baseline and stable Postop Assessment: no apparent nausea or vomiting Anesthetic complications: no   No complications documented.  Last Vitals:  Vitals:   06/07/20 1115 06/07/20 1200  BP: (!) 178/70 (!) 164/68  Pulse: 86 83  Resp: 17 16  Temp:  (!) 36.3 C  SpO2: 94% 95%    Last Pain:  Vitals:   06/07/20 1145  TempSrc:   PainSc: [place]

## 2020-06-07 NOTE — Anesthesia Preprocedure Evaluation (Addendum)
Anesthesia Evaluation  Patient identified by MRN, date of birth, ID band Patient awake    Reviewed: Allergy & Precautions, NPO status , Patient's Chart, lab work & pertinent test results  Airway Mallampati: II  TM Distance: >3 FB Neck ROM: Full    Dental  (+) Dental Advisory Given, Poor Dentition, Loose   Pulmonary COPD, former smoker,    Pulmonary exam normal breath sounds clear to auscultation       Cardiovascular hypertension, Pt. on medications negative cardio ROS Normal cardiovascular exam Rhythm:Regular Rate:Normal     Neuro/Psych Anxiety negative neurological ROS  negative psych ROS   GI/Hepatic Neg liver ROS, GERD  ,  Endo/Other  Morbid obesity  Renal/GU negative Renal ROS  negative genitourinary   Musculoskeletal negative musculoskeletal ROS (+)   Abdominal (+) + obese,   Peds negative pediatric ROS (+)  Hematology negative hematology ROS (+)   Anesthesia Other Findings Breast Cancer  Reproductive/Obstetrics negative OB ROS                            Anesthesia Physical Anesthesia Plan  ASA: III  Anesthesia Plan: General   Post-op Pain Management:  Regional for Post-op pain   Induction: Intravenous  PONV Risk Score and Plan: 3 and Ondansetron, Dexamethasone, Midazolam and Treatment may vary due to age or medical condition  Airway Management Planned: LMA  Additional Equipment:   Intra-op Plan:   Post-operative Plan: Extubation in OR  Informed Consent: I have reviewed the patients History and Physical, chart, labs and discussed the procedure including the risks, benefits and alternatives for the proposed anesthesia with the patient or authorized representative who has indicated his/her understanding and acceptance.     Dental advisory given  Plan Discussed with: CRNA  Anesthesia Plan Comments:         Anesthesia Quick Evaluation

## 2020-06-11 ENCOUNTER — Encounter (HOSPITAL_BASED_OUTPATIENT_CLINIC_OR_DEPARTMENT_OTHER): Payer: Self-pay | Admitting: General Surgery

## 2020-06-11 LAB — SURGICAL PATHOLOGY

## 2020-06-12 ENCOUNTER — Encounter: Payer: Self-pay | Admitting: *Deleted

## 2020-06-14 ENCOUNTER — Other Ambulatory Visit: Payer: Self-pay

## 2020-06-14 ENCOUNTER — Encounter: Payer: Self-pay | Admitting: Gynecologic Oncology

## 2020-06-14 ENCOUNTER — Inpatient Hospital Stay: Payer: 59 | Attending: Hematology | Admitting: Gynecologic Oncology

## 2020-06-14 VITALS — BP 148/58 | HR 73 | Temp 97.6°F | Resp 20 | Ht 70.0 in | Wt 298.8 lb

## 2020-06-14 DIAGNOSIS — J449 Chronic obstructive pulmonary disease, unspecified: Secondary | ICD-10-CM | POA: Diagnosis not present

## 2020-06-14 DIAGNOSIS — Z87891 Personal history of nicotine dependence: Secondary | ICD-10-CM | POA: Insufficient documentation

## 2020-06-14 DIAGNOSIS — Z7982 Long term (current) use of aspirin: Secondary | ICD-10-CM | POA: Diagnosis not present

## 2020-06-14 DIAGNOSIS — E785 Hyperlipidemia, unspecified: Secondary | ICD-10-CM | POA: Insufficient documentation

## 2020-06-14 DIAGNOSIS — E119 Type 2 diabetes mellitus without complications: Secondary | ICD-10-CM | POA: Insufficient documentation

## 2020-06-14 DIAGNOSIS — Z170 Estrogen receptor positive status [ER+]: Secondary | ICD-10-CM | POA: Insufficient documentation

## 2020-06-14 DIAGNOSIS — I10 Essential (primary) hypertension: Secondary | ICD-10-CM | POA: Diagnosis not present

## 2020-06-14 DIAGNOSIS — C50911 Malignant neoplasm of unspecified site of right female breast: Secondary | ICD-10-CM | POA: Diagnosis not present

## 2020-06-14 DIAGNOSIS — Z79899 Other long term (current) drug therapy: Secondary | ICD-10-CM | POA: Diagnosis not present

## 2020-06-14 DIAGNOSIS — I2510 Atherosclerotic heart disease of native coronary artery without angina pectoris: Secondary | ICD-10-CM | POA: Diagnosis not present

## 2020-06-14 DIAGNOSIS — K219 Gastro-esophageal reflux disease without esophagitis: Secondary | ICD-10-CM | POA: Diagnosis not present

## 2020-06-14 DIAGNOSIS — C541 Malignant neoplasm of endometrium: Secondary | ICD-10-CM | POA: Diagnosis not present

## 2020-06-14 DIAGNOSIS — Z6841 Body Mass Index (BMI) 40.0 and over, adult: Secondary | ICD-10-CM | POA: Insufficient documentation

## 2020-06-14 DIAGNOSIS — I739 Peripheral vascular disease, unspecified: Secondary | ICD-10-CM | POA: Insufficient documentation

## 2020-06-14 DIAGNOSIS — F419 Anxiety disorder, unspecified: Secondary | ICD-10-CM | POA: Insufficient documentation

## 2020-06-14 NOTE — Progress Notes (Signed)
Consult Note: Gyn-Onc  Consult was requested by Dr. Marvel Plan for the evaluation of Kelli Horn 64 y.o. female  CC:  Chief Complaint  Patient presents with  . Endometrial cancer    Assessment/Plan:  Kelli. Kelli Horn  is a 64 y.o.  year old P2 with grade 1 endometrial cancer. She has many complex medical comorbidities which seriously complicate her condition and decision making regarding treatment. She has COPD, CAD, peripheral vascular disease, morbid obesity with a BMI of 43kg/m2, recently diagnosed and incompletely treated hormone receptor positive stage I left breast cancer.  A detailed discussion was held with the patient and her mother in law with regard to to her endometrial cancer diagnosis. We discussed the standard management options for uterine cancer which includes either medical therapy with progesterone (oral or IUD) or surgery followed possibly by adjuvant therapy depending on the results of surgery. The surgical management include a robotic assisted total hysterectomy and removal of the tubes and ovaries. The patient has been counseled about these options and the risks of surgery in general including infection, bleeding, damage to surrounding structures (including bowel, bladder, ureters, nerves or vessels), and the postoperative risks of PE/ DVT, and lymphedema. She was informed that these risks are significantly higher for her due to her morbid obesity. She is also at increased risk for vascular complication (stroke, heart attack, peripheral ischemia) due to her underlying vascular disease. She is at increased risk for postoperative pulmonary issues due to her obesity, COPD and suspected sleep apnea. She faces delays in treatment of either/both her breast and endometrial cancers due to inability to perform major surgery during radiation (she would need to either complete radiation first, or hold off radiation until postop).   With respect to options  regarding medical therapy, she is a poor candidate for oral progestins due to her hormone receptor positive breast cancer.  A progestin releasing IUD would likely be safe and I explained this to the patient.  It is associated with a 60 to 80% chance of regression of the endometrial cancer, however requires serial biopsies to monitor for regression of disease.  The patient had very poor tolerance of office biopsy and therefore is concerned about this option.  My greatest concern actually resides in the fact that she needs to have vascular stenting performed due to her severe peripheral vascular disease, and following that we will need to be placed on anticoagulant therapy.  During that time of anticoagulant therapy I worried that she would have bleeding complications and issues with a Mirena IUD in place and have difficulty continuing anticoagulation therapy.    Hysterectomy performed after vascular stenting while the patient is on anticoagulant therapy would also be of high risk.  Therefore for these reasons, despite her substantially increased risks above the baseline population for hysterectomy, I favor hysterectomy as an option for her.  After counseling and consideration of her options, she is in agreement to proceed with robotic assisted total hysterectomy with bilateral sapingo-oophorectomy.  While I believe a minimally invasive route should be feasible, she will likely have difficulty with ventilation and Trendelenburg positioning and be at increased risk for postoperative hypoxia.  Therefore she will need to have her sleep assessment preoperatively and will need to have either CPAP or home O2 in place prior to [name] proceeding.  We will not perform sentinel lymph node biopsy or lymphadenectomy for this patient given her grade 1 tumor and exceptionally low risk (5%) for lymph node metastases (she has normal  CT imaging).  Our focus operatively should be on minimizing the duration of surgery and given there is  no survival advantage to lymphadenectomy for endometrial cancer, I see no virtue in the added risk and time to perform a lymph node assessment for this particular patient who is already at substantially high risk for complications.  Surgery is tentatively scheduled for either early December or late November which should facilitate her completing radiation therapy or alternatively obtaining the necessary pulmonary and cardiac and vascular optimization for the procedure.  Additionally she will need to continue her aspirin 81 mg perioperatively which, I explained to the patient increases her risk for bleeding complications, however given her severe vascular disease, I worry that she is at increased risk for heart attack stroke or peripheral ischemic event if we take her off this.  She will be seen by anesthesia for preoperative clearance and discussion of postoperative pain management.  She was given the opportunity to ask questions, which were answered to her satisfaction, and she is agreement with the above mentioned plan of care.   We explained that robotic hysterectomy is typically an outpatient procedure with same day discharge provided that she is meeting appropriate discharge criteria from the PACU. We provided extensive counseling regarding post-operative expectations for recovery and restrictions/limitations. We provided information regarding multi-modal pain therapy and the importance of avoidance of opioids.   We explained that after surgery we will review her definitive pathology and determine if adjuvant therapy is recommended.   HPI: Kelli Horn is a 65 year old P2 who was seen in consultation at the request of Dr Paula Compton for evaluation and treatment of grade 1 endometrial cancer in the setting of morbid obesity with a BMI of 43 kg meters squared, recent diagnosis of left sided breast cancer, stage I, ER/PR positive, peripheral vascular disease, coronary artery disease, possible  enteric ischemia, COPD, suspected sleep apnea, diabetes mellitus, and several other medical comorbidities.   Her symptoms began in July 2020 when she experienced an episode of vaginal spotting.  She again experienced vaginal spotting in February 2021.  She did not have a gynecologist.  She saw her primary care provider for a routine checkup in the summer 2021 and reported the symptom of vaginal spotting.  At that time her PCP recommend referral to a gynecologist for further work-up.  Work-up of symptoms included a transvaginal ultrasound, office endometrial biopsy, and CT abdomen and pelvis. Transvaginal US on May 17, 2020 showed a uterus measuring 5.7 x 6.9 x 5.9 cm with an endometrial thickness of 18 mm.  The uterus contained a 4.2 x 3.3 cm fibroid.  The uterus was retroflexed. Endometrial sampling with office [place] biopsy was performed on May 29, 2020 and showed FIGO grade 1 endometrioid adenocarcinoma.   The patient reported a complex past medical history.  She was morbidly obese with a BMI of 43 kg meters squared.  She has severe peripheral, coronary, and mesenteric vascular disease with symptoms of intermittent claudication and mesenteric ischemia.  She has findings on CT scan that are suggestive of a previous renal infarction.  She had a history of angina-like symptoms and has reported coronary calcifications.  CT imaging from August 2021 showed severe mixed calcific atherosclerosis.  She has poor sleep which is suspected to be from sleep apnea and was awaiting evaluation with sleep studies for either home O2 at night or CPAP.  She had COPD for which she was using multiple inhaled medications but no oral steroids.  She was recently diagnosed with ER/PR positive stage I breast cancer treated with lumpectomy and sentinel lymph node biopsy.  At the time of her endometrial cancer diagnosis she was awaiting definitive adjuvant radiation therapy to the left chest wall.  She would need to be  on antiestrogen therapy for prolonged period of time.  With respect to her vascular disease, Dr. [name] had planned for percutaneous peripheral stenting of the iliacs.  Her mesenteric ischemic symptoms were being worked up with a planned colonoscopy and endoscopy.  Potentially she would have attempts at stenting of her SMA.  The interventional cardiologist were hoping to delay proceeding with endovascular procedures until after she had completed her surgical management for her cancers because she would need to be on prolonged anticoagulant therapy after her stenting procedures.  She was taking 81 mg aspirin.  With respect to her performance status she is unable to walk up a flight of stairs due to claudication pain and shortness of breath.  She denied chest pain but really stresses her self enough to exert what had previously resulted in angina type symptoms.  She had a left heart catheterization and coronary angiography with Dr. Martinique on 04/23/2020 which showed 25% stenosis of the mid RCA, 25% stenosis of the mid LAD, normal left ventricular systolic function, mild elevation of left ventricular end-diastolic pressure.  65% estimated left ventricular ejection fraction.  No interventions were necessary at that procedure.  Carotid duplex on 05/16/2020 showed no significant carotid stenosis but absent right vertebral flow and bilateral subclavian stenosis.  The patient had appointments pending for gastroenterology for colonoscopy as well as for sleep studies.  Her past surgical history is most remarkable for an appendectomy and tubal ligation.  She has had 2 prior vaginal deliveries.  Family history is most significant for a mother with history of lung cancer with a smoking history.  Many of her grandmother's sisters had breast cancer.  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  She lives with her husband who is of excellent health and younger than the patient.  Current  Meds:  Outpatient Encounter Medications as of 06/14/2020  Medication Sig  . albuterol (PROVENTIL HFA;VENTOLIN HFA) 108 (90 Base) MCG/ACT inhaler Inhale 2 puffs into the lungs every 6 (six) hours as needed for wheezing or shortness of breath.   Marland Kitchen albuterol (PROVENTIL) (2.5 MG/3ML) 0.083% nebulizer solution Take 2.5 mg by nebulization every 6 (six) hours as needed for shortness of breath.  Marland Kitchen amLODipine (NORVASC) 2.5 MG tablet Take 1 tablet (2.5 mg total) by mouth daily.  Marland Kitchen aspirin EC 81 MG tablet Take 81 mg by mouth daily.  Marland Kitchen b complex vitamins tablet Take 1 tablet by mouth daily as needed (vertigo).  . enalapril (VASOTEC) 10 MG tablet Take 15 mg by mouth 2 (two) times daily as needed (high bp).   . Fluticasone-Umeclidin-Vilant (TRELEGY ELLIPTA) 100-62.5-25 MCG/INH AEPB Inhale 1 puff into the lungs daily.  . hydrOXYzine (VISTARIL) 25 MG capsule Take 25 mg by mouth 2 (two) times daily as needed for itching.   Marland Kitchen ibuprofen (ADVIL) 200 MG tablet Take 400 mg by mouth every 6 (six) hours as needed for headache or moderate pain.  . methocarbamol (ROBAXIN) 750 MG tablet Take 750 mg by mouth 2 (two) times daily as needed for muscle spasms.   Vladimir Faster Glycol-Propyl Glycol (SYSTANE ULTRA) 0.4-0.3 % SOLN Place 1 drop into both eyes 3 (three) times daily as needed (dry eyes).   Marland Kitchen  Polyvinyl Alcohol-Povidone (REFRESH OP) Place 1 drop into both eyes 4 (four) times daily as needed (dry eyes).  . rosuvastatin (CRESTOR) 5 MG tablet Take 5 mg twice a week  . chlorthalidone (HYGROTON) 25 MG tablet Take 1 tablet (25 mg total) by mouth daily. (Patient not taking: Reported on 06/14/2020)  . HYDROcodone-acetaminophen (NORCO/VICODIN) 5-325 MG tablet Take 1-2 tablets by mouth every 6 (six) hours as needed for moderate pain or severe pain. (Patient not taking: Reported on 06/14/2020)  . [DISCONTINUED] amoxicillin (AMOXIL) 500 MG capsule Take 500 mg by mouth 3 (three) times daily.  . [DISCONTINUED] nitroGLYCERIN (NITROSTAT)  0.4 MG SL tablet Place 1 tablet (0.4 mg total) under the tongue every 5 (five) minutes as needed for chest pain.  . [DISCONTINUED] sodium chloride flush (NS) 0.9 % injection 3 mL    No facility-administered encounter medications on file as of 06/14/2020.    Allergy:  Allergies  Allergen Reactions  . Carvedilol Shortness Of Breath    Reacts with albuterol, worsens asthma symptoms when taken together   . Peanut-Containing Drug Products     Asthma attack    Social Hx:   Social History   Socioeconomic History  . Marital status: Married    Spouse name: Not on file  . Number of children: 2  . Years of education: Not on file  . Highest education level: Not on file  Occupational History  . Occupation: retired   Tobacco Use  . Smoking status: Former Smoker    Packs/day: 2.00    Years: 10.00    Pack years: 20.00    Types: Cigarettes    Quit date: 04/16/2004    Years since quitting: 16.1  . Smokeless tobacco: Never Used  Vaping Use  . Vaping Use: Never used  Substance and Sexual Activity  . Alcohol use: No  . Drug use: No  . Sexual activity: Not Currently  Other Topics Concern  . Not on file  Social History Narrative  . Not on file   Social Determinants of Health   Financial Resource Strain:   . Difficulty of Paying Living Expenses: Not on file  Food Insecurity:   . Worried About Charity fundraiser in the Last Year: Not on file  . Ran Out of Food in the Last Year: Not on file  Transportation Needs:   . Lack of Transportation (Medical): Not on file  . Lack of Transportation (Non-Medical): Not on file  Physical Activity:   . Days of Exercise per Week: Not on file  . Minutes of Exercise per Session: Not on file  Stress:   . Feeling of Stress : Not on file  Social Connections:   . Frequency of Communication with Friends and Family: Not on file  . Frequency of Social Gatherings with Friends and Family: Not on file  . Attends Religious Services: Not on file  . Active  Member of Clubs or Organizations: Not on file  . Attends Archivist Meetings: Not on file  . Marital Status: Not on file  Intimate Partner Violence:   . Fear of Current or Ex-Partner: Not on file  . Emotionally Abused: Not on file  . Physically Abused: Not on file  . Sexually Abused: Not on file    Past Surgical Hx:  Past Surgical History:  Procedure Laterality Date  . APPENDECTOMY    . BREAST LUMPECTOMY WITH RADIOACTIVE SEED AND SENTINEL LYMPH NODE BIOPSY Bilateral 06/07/2020   Procedure: BILATERAL BREAST LUMPECTOMY WITH RADIOACTIVE SEED  AND LEFT SENTINEL LYMPH NODE BIOPSY;  Surgeon: [name], MD;  Location: [facility];  Service: General;  Laterality: Bilateral;  PEC BLOCK  . LEFT HEART CATH AND CORONARY ANGIOGRAPHY N/A 04/23/2020   Procedure: LEFT HEART CATH AND CORONARY ANGIOGRAPHY;  Surgeon: Martinique, Peter M, MD;  Location: [facility];  Service: Cardiovascular;  Laterality: N/A;    Past Medical Hx:  Past Medical History:  Diagnosis Date  . Acid reflux   . Anxiety   . Asthma   . 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 COPD (chronic obstructive pulmonary disease) ([place])   . Hyperlipidemia   . Hypertension   . Pre-diabetes     Past Gynecological History:  SVD x 2. Patient's last menstrual period was 12/09/2013.  Family Hx:  Family History  Problem Relation Age of Onset  . Heart attack Father 1  . Aneurysm Father   . Breast cancer Maternal Aunt   . Lung cancer Mother   . Diabetes Other   . Heart disease Paternal Uncle   . Aneurysm Paternal Uncle     Review of Systems:  Constitutional  Feels well,    ENT Normal appearing ears and nares bilaterally Skin/Breast  No rash, sores, jaundice, itching, dryness Cardiovascular  + SOB, LE pain with ambulation, nocturnal leg pain  Pulmonary  + cough or wheeze.  Gastro Intestinal  + intermittent melena and diarrhea Genito Urinary  No frequency, urgency, dysuria, +  postmenopausal bleeding Musculo Skeletal  No myalgia, arthralgia, joint swelling or pain  Neurologic  No weakness, numbness, change in gait,  Psychology  No depression, anxiety, insomnia.   Vitals:  Blood pressure (!) 148/58, pulse 73, temperature 97.6 F (36.4 C), temperature source Tympanic, resp. rate 20, height 5\' 10"  (1.778 m), weight 298 lb 12.8 oz (135.5 kg), last menstrual period 12/09/2013, SpO2 96 %.  Physical Exam: WD in NAD Neck  Supple NROM, without any enlargements.  Lymph Node Survey No cervical supraclavicular or inguinal adenopathy Cardiovascular  Cool but perfused peripheries. Lungs  No increased work of breathing at rest. Can lay flat for the exam.  Skin  No rash/lesions/breakdown  Psychiatry  Alert and oriented to person, place, and time  Abdomen  Normoactive bowel sounds, abdomen soft, non-tender and obese without evidence of hernia. Back No CVA tenderness Genito Urinary  Vulva/vagina: Normal external female genitalia.  No lesions. No discharge or bleeding.  Bladder/urethra:  No lesions or masses, well supported bladder  Vagina: normal in appearance  Cervix: Normal appearing, no lesions.  Uterus: Small, mobile, retroverted, no parametrial involvement or nodularity.  Adnexa: no palpable masses. Rectal  deferred Extremities  No bilateral cyanosis, clubbing or edema.  60 minutes of total time was spent for this patient encounter, including preparation, face-to-face counseling with the patient and coordination of care, and documentation of the encounter.   Thereasa Solo, MD  06/14/2020, 11:11 AM

## 2020-06-14 NOTE — Patient Instructions (Signed)
We will plan for surgery on August 06, 2020 or Aug 13, 2020 based on when your radiation is complete. You will be scheduled for a pre-op appt with Joylene John NP closer to your surgery date to discuss your surgery in detail. We will need for you to have your sleep study prior to surgery and receive clearance from your cardiologist and pulmonologist.

## 2020-06-14 NOTE — H&P (View-Only) (Signed)
Consult Note: Gyn-Onc  Consult was requested by Dr. Marvel Plan for the evaluation of Kelli Horn 64 y.o. female  CC:  Chief Complaint  Patient presents with  . Endometrial cancer    Assessment/Plan:  Kelli Horn  is a 64 y.o.  year old P2 with grade 1 endometrial cancer. She has many complex medical comorbidities which seriously complicate her condition and decision making regarding treatment. She has COPD, CAD, peripheral vascular disease, morbid obesity with a BMI of 43kg/m2, recently diagnosed and incompletely treated hormone receptor positive stage I left breast cancer.  A detailed discussion was held with the patient and her mother in law with regard to to her endometrial cancer diagnosis. We discussed the standard management options for uterine cancer which includes either medical therapy with progesterone (oral or IUD) or surgery followed possibly by adjuvant therapy depending on the results of surgery. The surgical management include a robotic assisted total hysterectomy and removal of the tubes and ovaries. The patient has been counseled about these options and the risks of surgery in general including infection, bleeding, damage to surrounding structures (including bowel, bladder, ureters, nerves or vessels), and the postoperative risks of PE/ DVT, and lymphedema. She was informed that these risks are significantly higher for her due to her morbid obesity. She is also at increased risk for vascular complication (stroke, heart attack, peripheral ischemia) due to her underlying vascular disease. She is at increased risk for postoperative pulmonary issues due to her obesity, COPD and suspected sleep apnea. She faces delays in treatment of either/both her breast and endometrial cancers due to inability to perform major surgery during radiation (she would need to either complete radiation first, or hold off radiation until postop).   With respect to options  regarding medical therapy, she is a poor candidate for oral progestins due to her hormone receptor positive breast cancer.  A progestin releasing IUD would likely be safe and I explained this to the patient.  It is associated with a 60 to 80% chance of regression of the endometrial cancer, however requires serial biopsies to monitor for regression of disease.  The patient had very poor tolerance of office biopsy and therefore is concerned about this option.  My greatest concern actually resides in the fact that she needs to have vascular stenting performed due to her severe peripheral vascular disease, and following that we will need to be placed on anticoagulant therapy.  During that time of anticoagulant therapy I worried that she would have bleeding complications and issues with a Mirena IUD in place and have difficulty continuing anticoagulation therapy.    Hysterectomy performed after vascular stenting while the patient is on anticoagulant therapy would also be of high risk.  Therefore for these reasons, despite her substantially increased risks above the baseline population for hysterectomy, I favor hysterectomy as an option for her.  After counseling and consideration of her options, she is in agreement to proceed with robotic assisted total hysterectomy with bilateral sapingo-oophorectomy.  While I believe a minimally invasive route should be feasible, she will likely have difficulty with ventilation and Trendelenburg positioning and be at increased risk for postoperative hypoxia.  Therefore she will need to have her sleep assessment preoperatively and will need to have either CPAP or home O2 in place prior to [name] proceeding.  We will not perform sentinel lymph node biopsy or lymphadenectomy for this patient given her grade 1 tumor and exceptionally low risk (5%) for lymph node metastases (she has normal  CT imaging).  Our focus operatively should be on minimizing the duration of surgery and given there is  no survival advantage to lymphadenectomy for endometrial cancer, I see no virtue in the added risk and time to perform a lymph node assessment for this particular patient who is already at substantially high risk for complications.  Surgery is tentatively scheduled for either early December or late November which should facilitate her completing radiation therapy or alternatively obtaining the necessary pulmonary and cardiac and vascular optimization for the procedure.  Additionally she will need to continue her aspirin 81 mg perioperatively which, I explained to the patient increases her risk for bleeding complications, however given her severe vascular disease, I worry that she is at increased risk for heart attack stroke or peripheral ischemic event if we take her off this.  She will be seen by anesthesia for preoperative clearance and discussion of postoperative pain management.  She was given the opportunity to ask questions, which were answered to her satisfaction, and she is agreement with the above mentioned plan of care.   We explained that robotic hysterectomy is typically an outpatient procedure with same day discharge provided that she is meeting appropriate discharge criteria from the PACU. We provided extensive counseling regarding post-operative expectations for recovery and restrictions/limitations. We provided information regarding multi-modal pain therapy and the importance of avoidance of opioids.   We explained that after surgery we will review her definitive pathology and determine if adjuvant therapy is recommended.   HPI: Kelli Horn is a 64 year old P2 who was seen in consultation at the request of Dr Paula Compton for evaluation and treatment of grade 1 endometrial cancer in the setting of morbid obesity with a BMI of 43 kg meters squared, recent diagnosis of left sided breast cancer, stage I, ER/PR positive, peripheral vascular disease, coronary artery disease, possible  enteric ischemia, COPD, suspected sleep apnea, diabetes mellitus, and several other medical comorbidities.   Her symptoms began in July 2020 when she experienced an episode of vaginal spotting.  She again experienced vaginal spotting in February 2021.  She did not have a gynecologist.  She saw her primary care provider for a routine checkup in the summer 2021 and reported the symptom of vaginal spotting.  At that time her PCP recommend referral to a gynecologist for further work-up.  Work-up of symptoms included a transvaginal ultrasound, office endometrial biopsy, and CT abdomen and pelvis. Transvaginal US on May 17, 2020 showed a uterus measuring 5.7 x 6.9 x 5.9 cm with an endometrial thickness of 18 mm.  The uterus contained a 4.2 x 3.3 cm fibroid.  The uterus was retroflexed. Endometrial sampling with office [place] biopsy was performed on May 29, 2020 and showed FIGO grade 1 endometrioid adenocarcinoma.   The patient reported a complex past medical history.  She was morbidly obese with a BMI of 43 kg meters squared.  She has severe peripheral, coronary, and mesenteric vascular disease with symptoms of intermittent claudication and mesenteric ischemia.  She has findings on CT scan that are suggestive of a previous renal infarction.  She had a history of angina-like symptoms and has reported coronary calcifications.  CT imaging from August 2021 showed severe mixed calcific atherosclerosis.  She has poor sleep which is suspected to be from sleep apnea and was awaiting evaluation with sleep studies for either home O2 at night or CPAP.  She had COPD for which she was using multiple inhaled medications but no oral steroids.  She was recently diagnosed with ER/PR positive stage I breast cancer treated with lumpectomy and sentinel lymph node biopsy.  At the time of her endometrial cancer diagnosis she was awaiting definitive adjuvant radiation therapy to the left chest wall.  She would need to be  on antiestrogen therapy for prolonged period of time.  With respect to her vascular disease, Dr. [name] had planned for percutaneous peripheral stenting of the iliacs.  Her mesenteric ischemic symptoms were being worked up with a planned colonoscopy and endoscopy.  Potentially she would have attempts at stenting of her SMA.  The interventional cardiologist were hoping to delay proceeding with endovascular procedures until after she had completed her surgical management for her cancers because she would need to be on prolonged anticoagulant therapy after her stenting procedures.  She was taking 81 mg aspirin.  With respect to her performance status she is unable to walk up a flight of stairs due to claudication pain and shortness of breath.  She denied chest pain but really stresses her self enough to exert what had previously resulted in angina type symptoms.  She had a left heart catheterization and coronary angiography with Dr. Martinique on 04/23/2020 which showed 25% stenosis of the mid RCA, 25% stenosis of the mid LAD, normal left ventricular systolic function, mild elevation of left ventricular end-diastolic pressure.  65% estimated left ventricular ejection fraction.  No interventions were necessary at that procedure.  Carotid duplex on 05/16/2020 showed no significant carotid stenosis but absent right vertebral flow and bilateral subclavian stenosis.  The patient had appointments pending for gastroenterology for colonoscopy as well as for sleep studies.  Her past surgical history is most remarkable for an appendectomy and tubal ligation.  She has had 2 prior vaginal deliveries.  Family history is most significant for a mother with history of lung cancer with a smoking history.  Many of her grandmother's sisters had breast cancer.  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  She lives with her husband who is of excellent health and younger than the patient.  Current  Meds:  Outpatient Encounter Medications as of 06/14/2020  Medication Sig  . albuterol (PROVENTIL HFA;VENTOLIN HFA) 108 (90 Base) MCG/ACT inhaler Inhale 2 puffs into the lungs every 6 (six) hours as needed for wheezing or shortness of breath.   Marland Kitchen albuterol (PROVENTIL) (2.5 MG/3ML) 0.083% nebulizer solution Take 2.5 mg by nebulization every 6 (six) hours as needed for shortness of breath.  Marland Kitchen amLODipine (NORVASC) 2.5 MG tablet Take 1 tablet (2.5 mg total) by mouth daily.  Marland Kitchen aspirin EC 81 MG tablet Take 81 mg by mouth daily.  Marland Kitchen b complex vitamins tablet Take 1 tablet by mouth daily as needed (vertigo).  . enalapril (VASOTEC) 10 MG tablet Take 15 mg by mouth 2 (two) times daily as needed (high bp).   . Fluticasone-Umeclidin-Vilant (TRELEGY ELLIPTA) 100-62.5-25 MCG/INH AEPB Inhale 1 puff into the lungs daily.  . hydrOXYzine (VISTARIL) 25 MG capsule Take 25 mg by mouth 2 (two) times daily as needed for itching.   Marland Kitchen ibuprofen (ADVIL) 200 MG tablet Take 400 mg by mouth every 6 (six) hours as needed for headache or moderate pain.  . methocarbamol (ROBAXIN) 750 MG tablet Take 750 mg by mouth 2 (two) times daily as needed for muscle spasms.   Vladimir Faster Glycol-Propyl Glycol (SYSTANE ULTRA) 0.4-0.3 % SOLN Place 1 drop into both eyes 3 (three) times daily as needed (dry eyes).   Marland Kitchen  Polyvinyl Alcohol-Povidone (REFRESH OP) Place 1 drop into both eyes 4 (four) times daily as needed (dry eyes).  . rosuvastatin (CRESTOR) 5 MG tablet Take 5 mg twice a week  . chlorthalidone (HYGROTON) 25 MG tablet Take 1 tablet (25 mg total) by mouth daily. (Patient not taking: Reported on 06/14/2020)  . HYDROcodone-acetaminophen (NORCO/VICODIN) 5-325 MG tablet Take 1-2 tablets by mouth every 6 (six) hours as needed for moderate pain or severe pain. (Patient not taking: Reported on 06/14/2020)  . [DISCONTINUED] amoxicillin (AMOXIL) 500 MG capsule Take 500 mg by mouth 3 (three) times daily.  . [DISCONTINUED] nitroGLYCERIN (NITROSTAT)  0.4 MG SL tablet Place 1 tablet (0.4 mg total) under the tongue every 5 (five) minutes as needed for chest pain.  . [DISCONTINUED] sodium chloride flush (NS) 0.9 % injection 3 mL    No facility-administered encounter medications on file as of 06/14/2020.    Allergy:  Allergies  Allergen Reactions  . Carvedilol Shortness Of Breath    Reacts with albuterol, worsens asthma symptoms when taken together   . Peanut-Containing Drug Products     Asthma attack    Social Hx:   Social History   Socioeconomic History  . Marital status: Married    Spouse name: Not on file  . Number of children: 2  . Years of education: Not on file  . Highest education level: Not on file  Occupational History  . Occupation: retired   Tobacco Use  . Smoking status: Former Smoker    Packs/day: 2.00    Years: 10.00    Pack years: 20.00    Types: Cigarettes    Quit date: 04/16/2004    Years since quitting: 16.1  . Smokeless tobacco: Never Used  Vaping Use  . Vaping Use: Never used  Substance and Sexual Activity  . Alcohol use: No  . Drug use: No  . Sexual activity: Not Currently  Other Topics Concern  . Not on file  Social History Narrative  . Not on file   Social Determinants of Health   Financial Resource Strain:   . Difficulty of Paying Living Expenses: Not on file  Food Insecurity:   . Worried About Charity fundraiser in the Last Year: Not on file  . Ran Out of Food in the Last Year: Not on file  Transportation Needs:   . Lack of Transportation (Medical): Not on file  . Lack of Transportation (Non-Medical): Not on file  Physical Activity:   . Days of Exercise per Week: Not on file  . Minutes of Exercise per Session: Not on file  Stress:   . Feeling of Stress : Not on file  Social Connections:   . Frequency of Communication with Friends and Family: Not on file  . Frequency of Social Gatherings with Friends and Family: Not on file  . Attends Religious Services: Not on file  . Active  Member of Clubs or Organizations: Not on file  . Attends Archivist Meetings: Not on file  . Marital Status: Not on file  Intimate Partner Violence:   . Fear of Current or Ex-Partner: Not on file  . Emotionally Abused: Not on file  . Physically Abused: Not on file  . Sexually Abused: Not on file    Past Surgical Hx:  Past Surgical History:  Procedure Laterality Date  . APPENDECTOMY    . BREAST LUMPECTOMY WITH RADIOACTIVE SEED AND SENTINEL LYMPH NODE BIOPSY Bilateral 06/07/2020   Procedure: BILATERAL BREAST LUMPECTOMY WITH RADIOACTIVE SEED  AND LEFT SENTINEL LYMPH NODE BIOPSY;  Surgeon: [name], MD;  Location: [facility];  Service: General;  Laterality: Bilateral;  PEC BLOCK  . LEFT HEART CATH AND CORONARY ANGIOGRAPHY N/A 04/23/2020   Procedure: LEFT HEART CATH AND CORONARY ANGIOGRAPHY;  Surgeon: Martinique, Peter M, MD;  Location: [facility];  Service: Cardiovascular;  Laterality: N/A;    Past Medical Hx:  Past Medical History:  Diagnosis Date  . Acid reflux   . Anxiety   . Asthma   . 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 COPD (chronic obstructive pulmonary disease) ([facility])   . Hyperlipidemia   . Hypertension   . Pre-diabetes     Past Gynecological History:  SVD x 2. Patient's last menstrual period was 12/09/2013.  Family Hx:  Family History  Problem Relation Age of Onset  . Heart attack Father 40  . Aneurysm Father   . Breast cancer Maternal Aunt   . Lung cancer Mother   . Diabetes Other   . Heart disease Paternal Uncle   . Aneurysm Paternal Uncle     Review of Systems:  Constitutional  Feels well,    ENT Normal appearing ears and nares bilaterally Skin/Breast  No rash, sores, jaundice, itching, dryness Cardiovascular  + SOB, LE pain with ambulation, nocturnal leg pain  Pulmonary  + cough or wheeze.  Gastro Intestinal  + intermittent melena and diarrhea Genito Urinary  No frequency, urgency, dysuria, +  postmenopausal bleeding Musculo Skeletal  No myalgia, arthralgia, joint swelling or pain  Neurologic  No weakness, numbness, change in gait,  Psychology  No depression, anxiety, insomnia.   Vitals:  Blood pressure (!) 148/58, pulse 73, temperature 97.6 F (36.4 C), temperature source Tympanic, resp. rate 20, height 5\' 10"  (1.778 m), weight 298 lb 12.8 oz (135.5 kg), last menstrual period 12/09/2013, SpO2 96 %.  Physical Exam: WD in NAD Neck  Supple NROM, without any enlargements.  Lymph Node Survey No cervical supraclavicular or inguinal adenopathy Cardiovascular  Cool but perfused peripheries. Lungs  No increased work of breathing at rest. Can lay flat for the exam.  Skin  No rash/lesions/breakdown  Psychiatry  Alert and oriented to person, place, and time  Abdomen  Normoactive bowel sounds, abdomen soft, non-tender and obese without evidence of hernia. Back No CVA tenderness Genito Urinary  Vulva/vagina: Normal external female genitalia.  No lesions. No discharge or bleeding.  Bladder/urethra:  No lesions or masses, well supported bladder  Vagina: normal in appearance  Cervix: Normal appearing, no lesions.  Uterus: Small, mobile, retroverted, no parametrial involvement or nodularity.  Adnexa: no palpable masses. Rectal  deferred Extremities  No bilateral cyanosis, clubbing or edema.  60 minutes of total time was spent for this patient encounter, including preparation, face-to-face counseling with the patient and coordination of care, and documentation of the encounter.   Thereasa Solo, MD  06/14/2020, 11:11 AM

## 2020-06-17 ENCOUNTER — Telehealth: Payer: Self-pay

## 2020-06-17 ENCOUNTER — Other Ambulatory Visit: Payer: Self-pay | Admitting: Gynecologic Oncology

## 2020-06-17 ENCOUNTER — Telehealth: Payer: Self-pay | Admitting: Cardiovascular Disease

## 2020-06-17 DIAGNOSIS — C541 Malignant neoplasm of endometrium: Secondary | ICD-10-CM

## 2020-06-17 NOTE — Telephone Encounter (Signed)
   Primary Cardiologist: Peter Martinique, MD  Chart reviewed as part of pre-operative protocol coverage. Patient was contacted 06/17/2020 in reference to pre-operative risk assessment for pending surgery as outlined below.  Kelli Horn was last seen on 05/30/20 by Dr. Martinique.  Since that day, Kelli Horn has done well. She has nonobstructive disease by heart cath 04/23/20. She just had breast surgery and did well.  Therefore, based on ACC/AHA guidelines, the patient would be at acceptable risk for the planned procedure without further cardiovascular testing.   The patient was advised that if she develops new symptoms prior to surgery to contact our office to arrange for a follow-up visit, and she verbalized understanding.  I will route this recommendation to the requesting party via Epic fax function and remove from pre-op pool. Please call with questions.  [place], PA 06/17/2020, 5:24 PM

## 2020-06-17 NOTE — Telephone Encounter (Signed)
Patient needs to reschedule the ABDOMINAL AORTOGRAM W/LOWER EXTREMITY [[id number]] that is currently set to be done on 06/24/2020  Patient has a hysterectomy scheduled for Nov 23rd, 2021 and needs to reschedule procedure. Patient says doctor has recommended 4 weeks of rest before procedure, she is trying to get the procedure rescheduled between December 20th and December 31st. Please advise/call

## 2020-06-17 NOTE — Telephone Encounter (Signed)
   [facility] Pre-operative Risk Assessment     Request for surgical clearance:  1. What type of surgery is being performed? XI ROBOTIC ASSISTED LAP TOTAL HYSTERECTOMY WITH BILATERAL SALPINGO OOPHORECTOMY   2. When is this surgery scheduled? 08/06/2020 or 08/13/2020   3. What type of clearance is required (medical clearance vs. Pharmacy clearance to hold med vs. Both)? MEDICAL  4. Are there any medications that need to be held prior to surgery and how long? NONE LISTED   5. Practice name and name of physician performing surgery? [facility] DR EMMA ROSSI   6. What is the office phone number? [phone]   7.   What is the office fax number? [phone]  8.   Anesthesia type (None, local, MAC, general) ? GENERAL

## 2020-06-18 ENCOUNTER — Telehealth: Payer: Self-pay | Admitting: Oncology

## 2020-06-18 ENCOUNTER — Telehealth: Payer: Self-pay | Admitting: *Deleted

## 2020-06-18 ENCOUNTER — Telehealth: Payer: Self-pay | Admitting: Cardiovascular Disease

## 2020-06-18 NOTE — Telephone Encounter (Signed)
Called the cardiology office back and gave new surgery date.

## 2020-06-18 NOTE — Telephone Encounter (Signed)
Follow Up:    Sharyn Lull called and wanted to let you know that pt surgery have been moved up to 07-23-20. She wants to know if the previous clearance is good for this new date?

## 2020-06-18 NOTE — Telephone Encounter (Signed)
Returned Michelle's called and made her aware that pt's clearance is still good

## 2020-06-18 NOTE — Telephone Encounter (Signed)
Called Neeva and she will be seeing Dr. Michail Sermon with [facility] on Friday, 06/21/20.  She does not have a colonoscopy or EGD scheduled.

## 2020-06-18 NOTE — Telephone Encounter (Signed)
Kelli Horn called from [facility] and stated that surgical is good on the 07/23/2020 date

## 2020-06-19 ENCOUNTER — Telehealth: Payer: Self-pay | Admitting: Radiation Oncology

## 2020-06-19 ENCOUNTER — Telehealth: Payer: Self-pay | Admitting: Oncology

## 2020-06-19 NOTE — Telephone Encounter (Signed)
Manson Allan and discussed plan for upcoming surgery and radiation.  Discussed that we need to have cardiac, pulmonary, sleep study and GI clearance before surgery.  Discussed possibly moving her surgery to 07/09/20 and she said she had an appointment to have all her teeth extracted due to periodontal disease on that day.  She would like to keep surgery on 07/23/20.  Encouraged her to call with any questions.

## 2020-06-19 NOTE — Telephone Encounter (Signed)
Called pt back this morning after getting a response from Dr. Isidore Moos that we can keep her 11/3 telephone visit appt and 11/4 CT SIM appt the same. And, as far as deciding the length of radiation treatments, Dr. Isidore Moos would like to discuss this with the pt at the consult to make a good, solid plan for her. Pt was agreeable.

## 2020-06-20 ENCOUNTER — Telehealth: Payer: Self-pay | Admitting: Pulmonary Disease

## 2020-06-20 ENCOUNTER — Ambulatory Visit: Payer: 59

## 2020-06-20 ENCOUNTER — Other Ambulatory Visit: Payer: Self-pay

## 2020-06-20 DIAGNOSIS — R0683 Snoring: Secondary | ICD-10-CM

## 2020-06-20 DIAGNOSIS — G4733 Obstructive sleep apnea (adult) (pediatric): Secondary | ICD-10-CM | POA: Diagnosis not present

## 2020-06-20 NOTE — Telephone Encounter (Signed)
Called and spoke with Sharyn Lull with [facility], advised that I had faxed the forms requested.  Surgical clearance faxed.  Nothing further needed.

## 2020-06-21 ENCOUNTER — Other Ambulatory Visit (HOSPITAL_COMMUNITY): Payer: 59

## 2020-06-21 ENCOUNTER — Telehealth: Payer: Self-pay | Admitting: Pulmonary Disease

## 2020-06-21 ENCOUNTER — Other Ambulatory Visit: Payer: Self-pay | Admitting: Gastroenterology

## 2020-06-21 NOTE — Telephone Encounter (Signed)
Left message for Kelli Horn to call back.

## 2020-06-24 ENCOUNTER — Encounter (HOSPITAL_COMMUNITY): Payer: Self-pay

## 2020-06-24 ENCOUNTER — Other Ambulatory Visit (HOSPITAL_COMMUNITY)
Admission: RE | Admit: 2020-06-24 | Discharge: 2020-06-24 | Disposition: A | Discharge status: Home / Self Care | Payer: 59 | Source: Ambulatory Visit | Attending: Gastroenterology | Admitting: Gastroenterology

## 2020-06-24 ENCOUNTER — Other Ambulatory Visit: Payer: Self-pay | Admitting: Gastroenterology

## 2020-06-24 ENCOUNTER — Ambulatory Visit (HOSPITAL_COMMUNITY): Admit: 2020-06-24 | Payer: 59 | Admitting: Cardiovascular Disease

## 2020-06-24 DIAGNOSIS — Z20822 Contact with and (suspected) exposure to covid-19: Secondary | ICD-10-CM | POA: Insufficient documentation

## 2020-06-24 DIAGNOSIS — G4733 Obstructive sleep apnea (adult) (pediatric): Secondary | ICD-10-CM | POA: Diagnosis not present

## 2020-06-24 DIAGNOSIS — Z01812 Encounter for preprocedural laboratory examination: Secondary | ICD-10-CM | POA: Diagnosis present

## 2020-06-24 LAB — SARS CORONAVIRUS 2 (TAT 6-24 HRS): SARS Coronavirus 2: NEGATIVE

## 2020-06-24 SURGERY — ABDOMINAL AORTOGRAM W/LOWER EXTREMITY
Anesthesia: LOCAL

## 2020-06-25 NOTE — Telephone Encounter (Signed)
Per 10/21 phone note this clearance was already faxed.   Heather, do you still have these forms?

## 2020-06-26 ENCOUNTER — Telehealth: Payer: Self-pay | Admitting: Oncology

## 2020-06-26 ENCOUNTER — Telehealth: Payer: Self-pay | Admitting: *Deleted

## 2020-06-26 NOTE — Telephone Encounter (Signed)
Called Otto back and advised her that she will be able to talk to anesthesia before her surgery about what type of anesthesia is used.  Also advised her to ask the pre op nurse at her appointment about which medications she can take before surgery and also when/where to schedule her Covid test.  She verbalized agreement and would like the surgery to be moved to 07/11/20.

## 2020-06-26 NOTE — Telephone Encounter (Signed)
Sharyn Lull with [facility] calling to state they never received forms - please resend to [place]: [phone]

## 2020-06-26 NOTE — Telephone Encounter (Signed)
Please advise if you still have these forms. Thanks.

## 2020-06-26 NOTE — Telephone Encounter (Signed)
Called Nanna and asked if she is ok to move her surgery up to 07/11/20 from 07/23/20.  She said she would be as long as the anesthesia used is propofol because it does not make her cough afterwards. She also had a bad experience with her last covid test and is wondering if she can go to a different location.  Advised her to schedule at a different location when she is called for her preop appointment.

## 2020-06-26 NOTE — Telephone Encounter (Signed)
Received cardiology clearance. Fax a copy of cardiology clearance to pre admission,  A copy sent to be scanned and a copy placed in the chart

## 2020-06-27 ENCOUNTER — Ambulatory Visit (HOSPITAL_COMMUNITY): Payer: 59 | Admitting: Registered Nurse

## 2020-06-27 ENCOUNTER — Encounter (HOSPITAL_COMMUNITY): Payer: Self-pay | Admitting: Gastroenterology

## 2020-06-27 ENCOUNTER — Ambulatory Visit (HOSPITAL_COMMUNITY)
Admission: RE | Admit: 2020-06-27 | Discharge: 2020-06-27 | Disposition: A | Discharge status: Home / Self Care | Payer: 59 | Attending: Gastroenterology | Admitting: Gastroenterology

## 2020-06-27 ENCOUNTER — Other Ambulatory Visit: Payer: Self-pay

## 2020-06-27 ENCOUNTER — Telehealth: Payer: Self-pay | Admitting: Pulmonary Disease

## 2020-06-27 ENCOUNTER — Encounter (HOSPITAL_COMMUNITY)
Admission: RE | Disposition: A | Discharge status: Home / Self Care | Payer: Self-pay | Source: Home / Self Care | Attending: Gastroenterology

## 2020-06-27 DIAGNOSIS — K625 Hemorrhage of anus and rectum: Secondary | ICD-10-CM | POA: Insufficient documentation

## 2020-06-27 DIAGNOSIS — D1779 Benign lipomatous neoplasm of other sites: Secondary | ICD-10-CM | POA: Diagnosis not present

## 2020-06-27 DIAGNOSIS — D125 Benign neoplasm of sigmoid colon: Secondary | ICD-10-CM | POA: Insufficient documentation

## 2020-06-27 DIAGNOSIS — D120 Benign neoplasm of cecum: Secondary | ICD-10-CM | POA: Insufficient documentation

## 2020-06-27 DIAGNOSIS — D124 Benign neoplasm of descending colon: Secondary | ICD-10-CM | POA: Insufficient documentation

## 2020-06-27 DIAGNOSIS — K621 Rectal polyp: Secondary | ICD-10-CM | POA: Diagnosis not present

## 2020-06-27 DIAGNOSIS — C541 Malignant neoplasm of endometrium: Secondary | ICD-10-CM

## 2020-06-27 DIAGNOSIS — K644 Residual hemorrhoidal skin tags: Secondary | ICD-10-CM | POA: Diagnosis not present

## 2020-06-27 HISTORY — PX: BIOPSY: SHX5522

## 2020-06-27 HISTORY — PX: COLONOSCOPY WITH PROPOFOL: SHX5780

## 2020-06-27 HISTORY — PX: POLYPECTOMY: SHX5525

## 2020-06-27 LAB — GLUCOSE, CAPILLARY: Glucose-Capillary: 119 mg/dL — ABNORMAL HIGH (ref 70–99)

## 2020-06-27 SURGERY — COLONOSCOPY WITH PROPOFOL
Anesthesia: Monitor Anesthesia Care

## 2020-06-27 MED ORDER — SODIUM CHLORIDE 0.9 % IV SOLN: INTRAVENOUS | Status: DC

## 2020-06-27 MED ORDER — PROPOFOL 500 MG/50ML IV EMUL
INTRAVENOUS | Status: DC | PRN
  Administered 2020-06-27: 150 ug/kg/min via INTRAVENOUS

## 2020-06-27 MED ORDER — LIDOCAINE 2% (20 MG/ML) 5 ML SYRINGE
INTRAMUSCULAR | Status: DC | PRN
  Administered 2020-06-27: 100 mg via INTRAVENOUS

## 2020-06-27 MED ORDER — PHENYLEPHRINE 40 MCG/ML (10ML) SYRINGE FOR IV PUSH (FOR BLOOD PRESSURE SUPPORT)
PREFILLED_SYRINGE | INTRAVENOUS | Status: DC | PRN
  Administered 2020-06-27 (×3): 80 ug via INTRAVENOUS

## 2020-06-27 MED ORDER — LACTATED RINGERS IV SOLN
INTRAVENOUS | Status: DC
  Administered 2020-06-27: 1000 mL via INTRAVENOUS

## 2020-06-27 MED ORDER — PROPOFOL 10 MG/ML IV BOLUS
INTRAVENOUS | Status: DC | PRN
  Administered 2020-06-27: 50 mg via INTRAVENOUS

## 2020-06-27 SURGICAL SUPPLY — 22 items

## 2020-06-27 NOTE — H&P (Signed)
Date of Initial H&P: 06/21/20  History reviewed, patient examined, no change in status, stable for surgery.

## 2020-06-27 NOTE — Telephone Encounter (Signed)
Clearance received from pulmonary a copy sent to pre op, a copy sent to be scanned and a copy in the chart

## 2020-06-27 NOTE — Anesthesia Procedure Notes (Signed)
Date/Time: 06/27/2020 12:14 PM Performed by: Talbot Grumbling, CRNA Oxygen Delivery Method: Simple face mask

## 2020-06-27 NOTE — Discharge Instructions (Signed)
Colonoscopy, Adult, Care After This sheet gives you information about how to care for yourself after your procedure. Your doctor may also give you more specific instructions. If you have problems or questions, call your doctor. What can I expect after the procedure? After the procedure, it is common to have:  A small amount of blood in your poop (stool) for 24 hours.  Some gas.  Mild cramping or bloating in your belly (abdomen). Follow these instructions at home: Eating and drinking   Drink enough fluid to keep your pee (urine) pale yellow.  Follow instructions from your doctor about what you cannot eat or drink.  Return to your normal diet as told by your doctor. Avoid heavy or fried foods that are hard to digest. Activity  Rest as told by your doctor.  Do not sit for a long time without moving. Get up to take short walks every 1-2 hours. This is important. Ask for help if you feel weak or unsteady.  Return to your normal activities as told by your doctor. Ask your doctor what activities are safe for you. To help cramping and bloating:   Try walking around.  Put heat on your belly as told by your doctor. Use the heat source that your doctor recommends, such as a moist heat pack or a heating pad. ? Put a towel between your skin and the heat source. ? Leave the heat on for 20-30 minutes. ? Remove the heat if your skin turns bright red. This is very important if you are unable to feel pain, heat, or cold. You may have a greater risk of getting burned. General instructions  For the first 24 hours after the procedure: ? Do not drive or use machinery. ? Do not sign important documents. ? Do not drink alcohol. ? Do your daily activities more slowly than normal. ? Eat foods that are soft and easy to digest.  Take over-the-counter or prescription medicines only as told by your doctor.  Keep all follow-up visits as told by your doctor. This is important. Contact a doctor  if:  You have blood in your poop 2-3 days after the procedure. Get help right away if:  You have more than a small amount of blood in your poop.  You see large clumps of tissue (blood clots) in your poop.  Your belly is swollen.  You feel like you may vomit (nauseous).  You vomit.  You have a fever.  You have belly pain that gets worse, and medicine does not help your pain. Summary  After the procedure, it is common to have a small amount of blood in your poop. You may also have mild cramping and bloating in your belly.  For the first 24 hours after the procedure, do not drive or use machinery, do not sign important documents, and do not drink alcohol.  Get help right away if you have a lot of blood in your poop, feel like you may vomit, have a fever, or have more belly pain. This information is not intended to replace advice given to you by your health care provider. Make sure you discuss any questions you have with your health care provider. Document Revised: 03/13/2019 Document Reviewed: 03/13/2019 Elsevier Patient Education  2020 [employer]  

## 2020-06-27 NOTE — Op Note (Signed)
[facility] Patient Name: Kelli Horn Procedure Date: 06/27/2020 MRN: [id number] Attending MD: Lear Ng , MD Date of Birth: [birth year] CSN: [id number] Age: 64 Admit Type: Outpatient Procedure:                Colonoscopy Indications:              This is the patient's first colonoscopy, Rectal                            bleeding Providers:                Lear Ng, MD, Benetta Spar RN, RN, Burtis Junes, RN, Marla Roe, CRNA Referring MD:             [name] Medicines:                Propofol per Anesthesia, Monitored Anesthesia Care Complications:            No immediate complications. Estimated Blood Loss:     Estimated blood loss was minimal. Procedure:                Pre-Anesthesia Assessment:                           - Prior to the procedure, a History and Physical                            was performed, and patient medications and                            allergies were reviewed. The patient's tolerance of                            previous anesthesia was also reviewed. The risks                            and benefits of the procedure and the sedation                            options and risks were discussed with the patient.                            All questions were answered, and informed consent                            was obtained. Prior Anticoagulants: The patient has                            taken no previous anticoagulant or antiplatelet                            agents. ASA Grade Assessment: III - A patient with  severe systemic disease. After reviewing the risks                            and benefits, the patient was deemed in                            satisfactory condition to undergo the procedure.                           After obtaining informed consent, the colonoscope                            was passed under direct vision. Throughout the                             procedure, the patient's blood pressure, pulse, and                            oxygen saturations were monitored continuously. The                            PCF-H190DL ([id number] Olympus pediatric colonscope                            was introduced through the anus and advanced to the                            the cecum, identified by appendiceal orifice and                            ileocecal valve. The colonoscopy was performed with                            difficulty due to significant looping, a tortuous                            colon and fair prep. Successful completion of the                            procedure was aided by straightening and shortening                            the scope to obtain bowel loop reduction, using                            scope torsion, applying abdominal pressure and                            lavage. The patient tolerated the procedure well.                            The quality of the bowel preparation was fair. The  ileocecal valve, appendiceal orifice, and rectum                            were photographed. Scope In: 12:23:06 PM Scope Out: 1:07:47 PM Scope Withdrawal Time: 0 hours 32 minutes 51 seconds  Total Procedure Duration: 0 hours 44 minutes 41 seconds  Findings:      A 7 mm polyp was found in the cecum. The polyp was sessile. The polyp       was removed with a hot snare. Resection and retrieval were complete.       Estimated blood loss: none.      Seven sessile and semi-sessile polyps were found in the sigmoid colon.       The polyps were 3 to 7 mm in size. These polyps were removed with a hot       snare. Resection and retrieval were complete. Estimated blood loss: none.      A 9 mm polyp was found in the descending colon. The polyp was flat. The       polyp was removed with a hot snare. Resection and retrieval were       complete. Estimated blood loss: none.      Three flat  and semi-sessile polyps were found in the sigmoid colon. The       polyps were 1 to 4 mm in size. These polyps were removed with a cold       biopsy forceps. Resection and retrieval were complete. Estimated blood       loss was minimal.      Two semi-sessile polyps were found in the rectum. The polyps were 3 to 7       mm in size. These polyps were removed with a hot snare. Resection and       retrieval were complete. Estimated blood loss: none.      Multiple small and large-mouthed diverticula were found in the sigmoid       colon.      There was a medium-sized lipoma, in the ascending colon.      Internal hemorrhoids were found during retroflexion. The hemorrhoids       were medium-sized and Grade I (internal hemorrhoids that do not       prolapse).      The perianal exam findings include non-thrombosed external hemorrhoids       and hypertrophied anal papilla(e). Impression:               - Preparation of the colon was fair.                           - One 7 mm polyp in the cecum, removed with a hot                            snare. Resected and retrieved.                           - Seven 3 to 7 mm polyps in the sigmoid colon,                            removed with a hot snare. Resected and retrieved.                           -  One 9 mm polyp in the descending colon, removed                            with a hot snare. Resected and retrieved.                           - Three 1 to 4 mm polyps in the sigmoid colon,                            removed with a cold biopsy forceps. Resected and                            retrieved.                           - Two 3 to 7 mm polyps in the rectum, removed with                            a hot snare. Resected and retrieved.                           - Diverticulosis in the sigmoid colon.                           - Medium-sized lipoma in the ascending colon.                           - Internal hemorrhoids.                           -  Non-thrombosed external hemorrhoids and                            hypertrophied anal papilla(e) found on perianal                            exam. Moderate Sedation:      N/A - MAC procedure Recommendation:           - Patient has a contact number available for                            emergencies. The signs and symptoms of potential                            delayed complications were discussed with the                            patient. Return to normal activities tomorrow.                            Written discharge instructions were provided to the                            patient.                           -  High fiber diet. Procedure Code(s):        --- Professional ---                           [phone], Colonoscopy, flexible; with removal of                            tumor(s), polyp(s), or other lesion(s) by snare                            technique                           45380, 26, Colonoscopy, flexible; with biopsy,                            single or multiple Diagnosis Code(s):        --- Professional ---                           K62.5, Hemorrhage of anus and rectum                           K63.5, Polyp of colon                           K62.1, Rectal polyp                           K64.0, First degree hemorrhoids                           K64.4, Residual hemorrhoidal skin tags                           D17.5, Benign lipomatous neoplasm of                            intra-abdominal organs                           K62.89, Other specified diseases of anus and rectum                           K57.30, Diverticulosis of large intestine without                            perforation or abscess without bleeding CPT copyright 2019 [employer]. All rights reserved. The codes documented in this report are preliminary and upon coder review may  be revised to meet current compliance requirements. Lear Ng, MD 06/27/2020 1:29:27 PM This report  has been signed electronically. Number of Addenda: 0

## 2020-06-27 NOTE — Anesthesia Postprocedure Evaluation (Signed)
Anesthesia Post Note  Patient: Kelli Horn  Procedure(s) Performed: COLONOSCOPY WITH PROPOFOL (N/A ) POLYPECTOMY     Patient location during evaluation: PACU Anesthesia Type: MAC Level of consciousness: awake and alert Pain management: pain level controlled Vital Signs Assessment: post-procedure vital signs reviewed and stable Respiratory status: spontaneous breathing, nonlabored ventilation, respiratory function stable and patient connected to nasal cannula oxygen Cardiovascular status: stable and blood pressure returned to baseline Postop Assessment: no apparent nausea or vomiting Anesthetic complications: no   No complications documented.  Last Vitals:  Vitals:   06/27/20 1330 06/27/20 1340  BP: (!) 157/72 (!) 166/78  Pulse: 76 78  Resp: 20 (!) 21  Temp:    SpO2: 95% 96%    Last Pain:  Vitals:   06/27/20 1340  TempSrc:   PainSc: 0-No pain                 Effie Berkshire

## 2020-06-27 NOTE — Transfer of Care (Signed)
Immediate Anesthesia Transfer of Care Note  Patient: Kelli Horn  Procedure(s) Performed: COLONOSCOPY WITH PROPOFOL (N/A ) POLYPECTOMY  Patient Location: PACU  Anesthesia Type:MAC  Level of Consciousness: sedated  Airway & Oxygen Therapy: Patient Spontanous Breathing and Patient connected to face mask oxygen  Post-op Assessment: Report given to RN and Post -op Vital signs reviewed and stable  Post vital signs: Reviewed and stable  Last Vitals:  Vitals Value Taken Time  BP    Temp    Pulse    Resp    SpO2      Last Pain:  Vitals:   06/27/20 1107  TempSrc: Oral  PainSc: 0-No pain         Complications: No complications documented.

## 2020-06-27 NOTE — Interval H&P Note (Signed)
History and Physical Interval Note:  06/27/2020 12:10 PM  [name]  has presented today for surgery, with the diagnosis of Rectal bleeding.  The various methods of treatment have been discussed with the patient and family. After consideration of risks, benefits and other options for treatment, the patient has consented to  Procedure(s): COLONOSCOPY WITH PROPOFOL (N/A) as a surgical intervention.  The patient's history has been reviewed, patient examined, no change in status, stable for surgery.  I have reviewed the patient's chart and labs.  Questions were answered to the patient's satisfaction.     Kelli Horn

## 2020-06-27 NOTE — Anesthesia Preprocedure Evaluation (Addendum)
Anesthesia Evaluation  Patient identified by MRN, date of birth, ID band Patient awake    Reviewed: Allergy & Precautions, NPO status , Patient's Chart, lab work & pertinent test results  Airway Mallampati: IV  TM Distance: >3 FB Neck ROM: Full    Dental  (+) Loose,    Pulmonary asthma , COPD, former smoker,     + decreased breath sounds      Cardiovascular hypertension, Pt. on medications + Peripheral Vascular Disease   Rhythm:Regular Rate:Normal     Neuro/Psych    GI/Hepatic GERD  ,  Endo/Other  diabetes  Renal/GU      Musculoskeletal   Abdominal (+) + obese,   Peds  Hematology   Anesthesia Other Findings   Reproductive/Obstetrics                            Anesthesia Physical Anesthesia Plan  ASA: III  Anesthesia Plan: MAC   Post-op Pain Management:    Induction: Intravenous  PONV Risk Score and Plan: 0 and Propofol infusion  Airway Management Planned: Natural Airway and Simple Face Mask  Additional Equipment: None  Intra-op Plan:   Post-operative Plan:   Informed Consent: I have reviewed the patients History and Physical, chart, labs and discussed the procedure including the risks, benefits and alternatives for the proposed anesthesia with the patient or authorized representative who has indicated his/her understanding and acceptance.       Plan Discussed with: CRNA  Anesthesia Plan Comments: (Breathing tx last night, no acute distress.)       Anesthesia Quick Evaluation

## 2020-06-27 NOTE — Telephone Encounter (Signed)
The document was faxed on 06/20/20 and I received a report stating that it was sent successfully.  The form is in Dr. August Albino box.

## 2020-06-27 NOTE — Telephone Encounter (Signed)
Noted. Nothing further needed. 

## 2020-06-28 ENCOUNTER — Encounter (HOSPITAL_COMMUNITY): Payer: Self-pay | Admitting: Gastroenterology

## 2020-06-28 LAB — SURGICAL PATHOLOGY

## 2020-07-01 ENCOUNTER — Encounter: Payer: Self-pay | Admitting: Physical Therapy

## 2020-07-01 ENCOUNTER — Ambulatory Visit: Payer: 59 | Attending: General Surgery | Admitting: Physical Therapy

## 2020-07-01 ENCOUNTER — Telehealth: Payer: Self-pay | Admitting: Pulmonary Disease

## 2020-07-01 ENCOUNTER — Other Ambulatory Visit: Payer: Self-pay

## 2020-07-01 DIAGNOSIS — R293 Abnormal posture: Secondary | ICD-10-CM | POA: Diagnosis present

## 2020-07-01 DIAGNOSIS — D0512 Intraductal carcinoma in situ of left breast: Secondary | ICD-10-CM

## 2020-07-01 DIAGNOSIS — Z483 Aftercare following surgery for neoplasm: Secondary | ICD-10-CM | POA: Diagnosis present

## 2020-07-01 DIAGNOSIS — M25612 Stiffness of left shoulder, not elsewhere classified: Secondary | ICD-10-CM | POA: Diagnosis present

## 2020-07-01 DIAGNOSIS — G4734 Idiopathic sleep related nonobstructive alveolar hypoventilation: Secondary | ICD-10-CM

## 2020-07-01 DIAGNOSIS — G4733 Obstructive sleep apnea (adult) (pediatric): Secondary | ICD-10-CM

## 2020-07-01 NOTE — Patient Instructions (Signed)
            [facility]         [address], [place] [address]         [phone]         Annia Friendly, PT, CLT   After Breast Cancer Class It is recommended you attend the ABC class to be educated on lymphedema risk reduction. This class is free of charge and lasts for 1 hour. It is a 1-time class.  You are scheduled for December 6th at 11:00.  Scar massage Begin gentle scar massage using coconut oil to all of your incisions - a few minutes each day.   Home exercise Program Continue doing the exercises I gave you to improve shoulder range of motion.   Follow up PT: It is recommended you return every 3 months for the first 3 years following surgery to be assessed on the SOZO machine for an L-Dex score. This helps prevent clinically significant lymphedema in 95% of patients. These follow up screens are 15 minute appointments that you are not billed for. You are scheduled for January 24th at 4:00.

## 2020-07-01 NOTE — Telephone Encounter (Signed)
Spoke to patient, who is requesting sleep study results from 06/21/2020.  Dr. Erin Fulling, please advise. Thanks

## 2020-07-01 NOTE — Therapy (Signed)
[place], [place], [address] Phone: [phone]   Fax:  [phone]  Physical Therapy Treatment  Patient Details  Name: Kelli Horn MRN: [id number] Date of Birth: 08-02-1956 Referring Provider (PT): Dr. Autumn Messing   Encounter Date: 07/01/2020   PT End of Session - 07/01/20 1557    Visit Number 2    Number of Visits 2    PT Start Time 1506    PT Stop Time 1555    PT Time Calculation (min) 49 min    Activity Tolerance Patient tolerated treatment well    Behavior During Therapy [facility] for tasks assessed/performed           Past Medical History:  Diagnosis Date  . Acid reflux   . Anxiety   . Asthma   . 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 COPD (chronic obstructive pulmonary disease) ([place])   . Diabetes mellitus without complication ([place])   . Hyperlipidemia   . Hypertension   . Pre-diabetes     Past Surgical History:  Procedure Laterality Date  . APPENDECTOMY    . BIOPSY  06/27/2020   Procedure: BIOPSY;  Surgeon: [name], MD;  Location: [facility];  Service: Endoscopy;;  . BREAST LUMPECTOMY WITH RADIOACTIVE SEED AND SENTINEL LYMPH NODE BIOPSY Bilateral 06/07/2020   Procedure: BILATERAL BREAST LUMPECTOMY WITH RADIOACTIVE SEED AND LEFT SENTINEL LYMPH NODE BIOPSY;  Surgeon: [name], MD;  Location: [facility];  Service: General;  Laterality: Bilateral;  PEC BLOCK  . COLONOSCOPY WITH PROPOFOL N/A 06/27/2020   Procedure: COLONOSCOPY WITH PROPOFOL;  Surgeon: [name], MD;  Location: [facility];  Service: Endoscopy;  Laterality: N/A;  . LEFT HEART CATH AND CORONARY ANGIOGRAPHY N/A 04/23/2020   Procedure: LEFT HEART CATH AND CORONARY ANGIOGRAPHY;  Surgeon: Martinique, Peter M, MD;  Location: [facility];  Service: Cardiovascular;  Laterality: N/A;  . POLYPECTOMY  06/27/2020   Procedure: POLYPECTOMY;  Surgeon: [name], MD;   Location: [facility];  Service: Endoscopy;;    There were no vitals filed for this visit.   Subjective Assessment - 07/01/20 1511    Subjective Patient reports she underwent a bilateral lumpectomy and sentinel node biopsy (1 negative node) on 06/07/2020. She was diagnosed with endometrial cancer in early October. She is scheduled for a hysterectomy 07/11/2020. She is scheduled to have left breast radiation.    Pertinent History Patient was diagnosed on 04/10/2020 with left DCIS breast cancer with concerns of possible invasion. She underwent a bilateral lumpectomy and sentinel node biopsy (1 negative node) on 06/07/2020. She was diagnosed with endometrial cancer in early October. She is scheduled for a hysterectomy 11/11/2021It is ER/PR positive. She has a complex sclerosing lesion in the right breast. She has diabetes, hypertension, severe vascular disease and COPD.    Patient Stated Goals See if my arm is doing ok    Currently in Pain? Yes    Pain Score 7     Pain Location Breast    Pain Orientation Left    Pain Descriptors / Indicators Tender    Pain Type Surgical pain    Pain Onset 1 to 4 weeks ago    Pain Frequency Intermittent    Aggravating Factors  Pushing on it    Pain Relieving Factors Resting              [facility] PT Assessment - 07/01/20 0001      Assessment  Medical Diagnosis s/p bil lumpectomy and left SLNB    Referring Provider (PT) Dr. Autumn Messing    Onset Date/Surgical Date 06/07/20    Hand Dominance Right    Prior Therapy Baselines      Precautions   Precautions Other (comment)    Precaution Comments recent surgery; left arm lymphedema risk      Restrictions   Weight Bearing Restrictions No      Balance Screen   Has the patient fallen in the past 6 months No    Has the patient had a decrease in activity level because of a fear of falling?  No    Is the patient reluctant to leave their home because of a fear of falling?  No      Home Environment   Living  Environment Private residence    Living Arrangements Spouse/significant other;Children   Husband and 65 y.o. son who has a disability   Available Help at Discharge Family      Prior Function   Level of [place] Retired    Leisure She does not exercise      Cognition   Overall Cognitive Status Within Functional Limits for tasks assessed      Observation/Other Assessments   Observations Palpable hardness present around incision sites due to scar tissue. No edema or redness present.       Posture/Postural Control   Posture/Postural Control Postural limitations    Postural Limitations Rounded Shoulders;Forward head;Increased thoracic kyphosis;Flexed trunk      ROM / Strength   AROM / PROM / Strength AROM      AROM   AROM Assessment Site Shoulder    Right/Left Shoulder Right;Left    Right Shoulder Extension 52 Degrees    Right Shoulder Flexion 109 Degrees    Right Shoulder ABduction 111 Degrees    Right Shoulder Internal Rotation 57 Degrees    Right Shoulder External Rotation 67 Degrees    Left Shoulder Extension 49 Degrees    Left Shoulder Flexion 101 Degrees    Left Shoulder ABduction 114 Degrees    Left Shoulder Internal Rotation 33 Degrees    Left Shoulder External Rotation 73 Degrees             LYMPHEDEMA/ONCOLOGY QUESTIONNAIRE - 07/01/20 0001      Type   Cancer Type Left breast      Surgeries   Lumpectomy Date 06/07/20    Sentinel Lymph Node Biopsy Date 06/07/20    Number Lymph Nodes Removed 1      Treatment   Active Chemotherapy Treatment No    Past Chemotherapy Treatment No    Active Radiation Treatment No    Past Radiation Treatment No    Current Hormone Treatment No    Past Hormone Therapy No      What other symptoms do you have   Are you Having Heaviness or Tightness No    Are you having Pain Yes    Are you having pitting edema No    Is it Hard or Difficult finding clothes that fit No    Do you have infections No     Is there Decreased scar mobility No    [place]mer Sign No      Lymphedema Assessments   Lymphedema Assessments Upper extremities      Right Upper Extremity Lymphedema   10 cm Proximal to Olecranon Process 43 cm    Olecranon Process 30.2 cm    10 cm  Proximal to Ulnar Styloid Process 28 cm    Just Proximal to Ulnar Styloid Process 18.5 cm    Across Hand at [employer] 18.7 cm    At [place] of 2nd Digit 6.5 cm      Left Upper Extremity Lymphedema   10 cm Proximal to Olecranon Process 39.3 cm    Olecranon Process 30.4 cm    10 cm Proximal to Ulnar Styloid Process 25.8 cm    Just Proximal to Ulnar Styloid Process 17.4 cm    Across Hand at [employer] 18.5 cm    At [place] of 2nd Digit 6.1 cm              Quick Dash - 07/01/20 0001    Open a tight or new jar Mild difficulty    Do heavy household chores (wash walls, wash floors) Unable    Carry a shopping bag or briefcase Mild difficulty    Wash your back Unable    Use a knife to cut food No difficulty    Recreational activities in which you take some force or impact through your arm, shoulder, or hand (golf, hammering, tennis) Moderate difficulty    During the past week, to what extent has your arm, shoulder or hand problem interfered with your normal social activities with family, friends, neighbors, or groups? Slightly    During the past week, to what extent has your arm, shoulder or hand problem limited your work or other regular daily activities Slightly    Arm, shoulder, or hand pain. None    Tingling (pins and needles) in your arm, shoulder, or hand None    Difficulty Sleeping No difficulty    DASH Score 31.82 %                          PT Education - 07/01/20 1556    Education Details Follow up care, lymphedema risk reduction, HEP    Person(s) Educated Patient    Methods Explanation;Demonstration;Handout    Comprehension Returned demonstration;Verbalized understanding               PT Long Term  Goals - 07/01/20 1608      PT LONG TERM GOAL #1   Title Patient will demonstrate she has regained full shoulder ROM and function post operatively compared to baselines.    Time 8    Period Weeks    Status Achieved                 Plan - 07/01/20 1606    Clinical Impression Statement Patient is doing well s/p bil lumpectomy and left SLNB. She has regianed shoulder ROM which was limited at baseline. She plans to undergo radiation but is scheduled for a hysterectomy next week. She is healing well at her incision sites and has no sign of lymphedema. She will benefit from attending the After Breast Cancer class on 07/15/2020 for lympehdema educaiton. Otherwise she has no PT needs at this time.    PT Treatment/Interventions ADLs/Self Care Home Management;Therapeutic exercise;Patient/family education    PT Next Visit Plan D/C    PT Home Exercise Plan Post op shoulder ROM HEP    Consulted and Agree with Plan of Care Patient;Family member/caregiver           Patient will benefit from skilled therapeutic intervention in order to improve the following deficits and impairments:  Postural dysfunction, Decreased range of motion, Impaired UE functional use, Pain,  Decreased knowledge of precautions, Decreased scar mobility  Visit Diagnosis: Abnormal posture  Stiffness of left shoulder, not elsewhere classified  Ductal carcinoma in situ (DCIS) of left breast  Aftercare following surgery for neoplasm     Problem List Patient Active Problem List   Diagnosis Date Noted  . Rectal bleeding 06/27/2020  . Carotid artery disease ([place]) 05/29/2020  . 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 PAD (peripheral artery disease) ([place]) 04/23/2020  . Upper back pain 03/24/2016  . Left shoulder pain 03/24/2016  . Hypersomnia with sleep apnea 02/13/2016  . 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 Morbid obesity due to excess calories ([place]) 02/13/2016  . Circadian rhythm sleep  disorder, 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  . Weakness of both legs 02/13/2016  . Angina syndrome, abdominal ([place]) 02/13/2016  . History of progressive weakness 01/10/2016  . Unilateral facial pain 01/10/2016  . Confusion 01/10/2016  . Gait instability 01/10/2016  . Chest pain 12/11/2015  . Hypertension 12/11/2015  . Asthma 12/11/2015  . Normocytic anemia 12/11/2015  . Leukocytosis 12/11/2015  . Prediabetes 12/11/2015  . Unstable angina ([place]) 12/11/2015  . Chest pain with moderate risk for cardiac etiology   . Dyslipidemia   . Pain in the chest    PHYSICAL THERAPY DISCHARGE SUMMARY  Visits from Start of Care: 2  Current functional level related to goals / functional outcomes: See above for objective measurements.   Remaining deficits: None   Education / Equipment: HEP and lymphedema education  Plan: Patient agrees to discharge.  Patient goals were met. Patient is being discharged due to meeting the stated rehab goals.  ?????         Annia Friendly, [place] 07/01/20 4:09 PM  [place], [place], [address] Phone: [phone]   Fax:  [phone]  Name: Kelli Horn MRN: [id number] Date of Birth: 1956-03-28

## 2020-07-02 ENCOUNTER — Encounter (HOSPITAL_COMMUNITY): Payer: 59

## 2020-07-02 NOTE — Progress Notes (Signed)
Location of Breast Cancer: Malignant neoplasm of lower-outer quadrant of LEFT breast, estrogen receptor positive  Histology per Pathology Report:  06/07/2020 FINAL MICROSCOPIC DIAGNOSIS:  A. 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  - Margins of resection are not involved (Closest margin: 2 mm, medial).  - Biopsy site.  - See oncology table.  B. BREAST, RIGHT, LUMPECTOMY:  - Fibrocystic change.  - Biopsy site.  C. SENTINEL LYMPH NODE, LEFT #1, BIOPSY:  - One lymph node, negative for carcinoma (0/1).  Receptor Status: ER(100%), PR (100%)  Did patient present with symptoms (if so, please note symptoms) or was this found on screening mammography?:  Her mass was found be screening mammogram. She notes she was having intermittent right beast burning sensation for several months but not in area of her left breast cancer and her breast got larger  Past/Anticipated interventions by surgeon, if any: 06/07/2020 Dr. Autumn Messing BILATERAL BREAST LUMPECTOMY WITH RADIOACTIVE SEED LOCALIZATION  AND LEFT DEEP AXILLARY SENTINEL LYMPH NODE BIOPSY (Bilateral) - PEC BLOCK  Past/Anticipated interventions by medical oncology, if any:  Under care of Dr. Truitt Merle -Given symptomatic right breast changes her right breast was also biopsied and path showed complex sclerotic lesion with calcifications.  -She is a candidate for breast conservation surgery. She has been seen by breast surgeon Dr. Marlou Starks who recommends bilateral lumpectomy and Left Sentinel LN Biopsy.  -If this is DCIS alone, this will be cured by complete surgical resection. Any form of adjuvant therapy is preventive.  -If found to be invasive cancer, this is likely very early stage cancer, she will unlikely need adjuvant chemotherapy. If invasive component in surgical sample is greater than expected, will obtain Oncotype Dx testing to determine benefit of chemotherapy.  -Given her strongly positive ER  and PR, I do recommend antiestrogen therapy. I briefly reviewed side effects with her. She is interested.  -She will likely benefit from breast radiation if she undergo lumpectomy to decrease the risk of future breast cancer. She will discuss this further with Dr Isidore Moos today.  -We also discussed that biopsy may have sampling limitation, we will review her surgical path, to see if she has any invasive carcinoma components. -We discussed breast cancer surveillance after she completes treatment, Including annual mammogram, breast exam every 6-12 months. -F/u after surgery or Radiation   Lymphedema issues, if any:  Patient denies    Pain issues, if any:  Patient reports intermitent discomfort/tenderness to the left breast ("feels like a sunburn on the inside")  SAFETY ISSUES:  Prior radiation? No  Pacemaker/ICD? No  Possible current pregnancy? No--postmenopausal   Is the patient on methotrexate? No  Current Complaints / other details:   Also under the care of Dr. [name] for grade 1 endometrial cancer. She is scheduled for total hysterectomy with bilateral salpingo oophorectomy on 07/11/2020. She states she will be advised when she can have her vascular surgery (percutaneous peripheral stenting of the iliacs by Dr. Quay Burow) after her hysterectomy is completed,

## 2020-07-03 ENCOUNTER — Telehealth: Payer: Self-pay | Admitting: Oncology

## 2020-07-03 ENCOUNTER — Other Ambulatory Visit: Payer: Self-pay

## 2020-07-03 ENCOUNTER — Ambulatory Visit
Admission: RE | Admit: 2020-07-03 | Discharge: 2020-07-03 | Disposition: A | Discharge status: Home / Self Care | Payer: 59 | Source: Ambulatory Visit | Attending: Hematology | Admitting: Hematology

## 2020-07-03 ENCOUNTER — Encounter: Payer: Self-pay | Admitting: Radiation Oncology

## 2020-07-03 ENCOUNTER — Ambulatory Visit: Payer: 59 | Admitting: Radiation Oncology

## 2020-07-03 ENCOUNTER — Ambulatory Visit
Admission: RE | Admit: 2020-07-03 | Discharge: 2020-07-03 | Disposition: A | Discharge status: Home / Self Care | Payer: 59 | Source: Ambulatory Visit | Attending: Radiation Oncology | Admitting: Radiation Oncology

## 2020-07-03 DIAGNOSIS — D0512 Intraductal carcinoma in situ of left breast: Secondary | ICD-10-CM

## 2020-07-03 NOTE — Telephone Encounter (Signed)
Spoke with the pt and notified her of results per Dr Erin Fulling. She states that she does not want to start on CPAP due to being claustrophobic. She is asking if we can prescribe o2 to use with sleep instead. She states this was discussed at her visit. Please advise, thanks!

## 2020-07-03 NOTE — Telephone Encounter (Signed)
Kelli Horn called and asked when her preop appointment will be since her surgery was moved to 11/11.  Called Preop at [facility] and they will call her with a new appointment.

## 2020-07-03 NOTE — Telephone Encounter (Signed)
Please let her know that she has moderate obstructive sleep apnea. Based on our discussion during the office visit, she would likely benefit from CPAP therapy. If she is interested in starting this, please send in an order for machine and mask fitting with auto-titrating pressure between 5-15cmH20 to get started until she is able to be seen by one of our sleep specialists. She can also wait to be seen in clinic by our sleep team prior to starting CPAP if she would like.  Thanks, Wille Glaser

## 2020-07-03 NOTE — Progress Notes (Signed)
Radiation Oncology         [phone]) [phone] ________________________________  Name: Kelli Horn MRN: [id number]  Date: 07/03/2020  DOB: July 09, 1956  Follow-Up Visit Note by telephone.  The patient opted for telemedicine to maximize safety during the pandemic.  MyChart video was not obtainable.  Outpatient  CC: [name], MD  [name], *  Diagnosis:      ICD-10-CM   1. Ductal carcinoma in situ (DCIS) of left breast  D05.12    Stage 0 pTis DCIS of left breast  CHIEF COMPLAINT: Here to discuss management of left breast DCIS  Narrative:  The patient returns today for follow-up. She was seen at [facility] on 05/22/2020.   Since consultation date, she has not undergone any significant imaging studies of breasts.   Breast/nodal surgery on date of 06/07/2020 revealed: tumor size of 1.3 cm; histology of solid papillary carcinoma without invasive carcinoma; margin status to invasive disease: uninvolved (closest medial margin was 2 mm; nodal status of negative (0/1); ER status: 100% strong; PR status: 100% strong; Grade: 2.  Of note, the patient was seen by Dr. [name] on 06/14/2020 for newly diagnosed endometrial cancer. She is scheduled to undergo total hysterectomy with bilateral salpingo-oophorectomy on 07/11/2020.  Symptomatically, the patient reports: She is doing well after surgery.  She does have a little discomfort and tenderness of the left breast.  She denies lymphedema.  She cares for her son who has special needs.        ALLERGIES:  is allergic to carvedilol and peanut-containing drug products.  Meds: Current Outpatient Medications  Medication Sig Dispense Refill  . albuterol (PROVENTIL HFA;VENTOLIN HFA) 108 (90 Base) MCG/ACT inhaler Inhale 2 puffs into the lungs every 6 (six) hours as needed for wheezing or shortness of breath.     Marland Kitchen albuterol (PROVENTIL) (2.5 MG/3ML) 0.083% nebulizer solution Take 2.5 mg by nebulization every 6 (six) hours as  needed for shortness of breath.    Marland Kitchen amLODipine (NORVASC) 2.5 MG tablet Take 1 tablet (2.5 mg total) by mouth daily. 180 tablet 3  . aspirin EC 81 MG tablet Take 81 mg by mouth daily.    Marland Kitchen b complex vitamins tablet Take 1 tablet by mouth daily as needed (vertigo).    . carvedilol (COREG) 12.5 MG tablet Take 12.5 mg by mouth 2 (two) times daily.    . chlorthalidone (HYGROTON) 25 MG tablet Take 1 tablet (25 mg total) by mouth daily. (Patient not taking: Reported on 06/14/2020) 90 tablet 3  . enalapril (VASOTEC) 10 MG tablet Take 15 mg by mouth 2 (two) times daily as needed (high bp).     . Fluticasone-Umeclidin-Vilant (TRELEGY ELLIPTA) 100-62.5-25 MCG/INH AEPB Inhale 1 puff into the lungs daily. 1 each 6  . HYDROcodone-acetaminophen (NORCO/VICODIN) 5-325 MG tablet Take 1-2 tablets by mouth every 6 (six) hours as needed for moderate pain or severe pain. 10 tablet 0  . hydrOXYzine (VISTARIL) 25 MG capsule Take 25 mg by mouth 2 (two) times daily as needed for itching.     Marland Kitchen ibuprofen (ADVIL) 200 MG tablet Take 400 mg by mouth every 6 (six) hours as needed for headache or moderate pain.    . methocarbamol (ROBAXIN) 750 MG tablet Take 750 mg by mouth 2 (two) times daily as needed for muscle spasms.     . nitroGLYCERIN (NITROSTAT) 0.4 MG SL tablet Place 0.4 mg under the tongue as needed.    Vladimir Faster Glycol-Propyl Glycol (SYSTANE ULTRA) 0.4-0.3 %  SOLN Place 1 drop into both eyes 3 (three) times daily as needed (dry eyes).     . Polyvinyl Alcohol-Povidone (REFRESH OP) Place 1 drop into both eyes 4 (four) times daily as needed (dry eyes).    . rosuvastatin (CRESTOR) 5 MG tablet Take 5 mg twice a week 30 tablet 6   No current facility-administered medications for this encounter.    Physical Findings:  vitals were not taken for this visit. .     General: Alert and oriented, in no acute distress  Lab Findings: Lab Results  Component Value Date   WBC 11.5 (H) 05/22/2020   HGB 13.7 05/22/2020   HCT  42.1 05/22/2020   MCV 83.4 05/22/2020   PLT 415 (H) 05/22/2020    @LASTCHEMISTRY @  Radiographic Findings: NM Sentinel Node Inj-No Rpt (Breast)  Result Date: 06/07/2020 Sulfur colloid was injected by the nuclear medicine technologist for melanoma sentinel node.   MM Breast Surgical Specimen  Result Date: 06/07/2020 CLINICAL DATA:  Status post seed localized LEFT lumpectomy. EXAM: SPECIMEN RADIOGRAPH OF THE LEFT BREAST COMPARISON:  Previous exam(s). FINDINGS: Status post excision of the left breast. The radioactive seed and coil shaped biopsy marker clip are present, completely intact, and were marked for pathology. Clip and seed appear well centered in specimen on both views. Findings are discussed with the operating room nurse the time of interpretation. IMPRESSION: Specimen radiograph of the left breast. Electronically Signed   By: Nolon Nations M.D.   On: 06/07/2020 10:26   MM Breast Surgical Specimen  Result Date: 06/07/2020 CLINICAL DATA:  Status post radioactive seed localization of 2 sites in the LEFT breast. EXAM: SPECIMEN RADIOGRAPH OF THE LEFT BREAST COMPARISON:  Previous exam(s). FINDINGS: Status post excision of the left breast. The radioactive seed and coil shaped biopsy marker clip are present, completely intact, and were marked for pathology. The coil shaped clip is identified at the edge of the specimen. The radioactive seed is well centered on the two views. Findings are discussed with the operating room nurse at the time of interpretation. IMPRESSION: Specimen radiograph of the left breast. Electronically Signed   By: Nolon Nations M.D.   On: 06/07/2020 10:25   MM LT RADIOACTIVE SEED LOC MAMMO GUIDE  Result Date: 06/06/2020 CLINICAL DATA:  Recent diagnosis of left breast cancer for seed placement prior to surgery. EXAM: MAMMOGRAPHIC GUIDED RADIOACTIVE SEED LOCALIZATION OF THE LEFT BREAST COMPARISON:  Previous exam(s). FINDINGS: Patient presents for radioactive seed  localization prior to left breast surgery. I met with the patient and we discussed the procedure of seed localization including benefits and alternatives. We discussed the high likelihood of a successful procedure. We discussed the risks of the procedure including infection, bleeding, tissue injury and further surgery. We discussed the low dose of radioactivity involved in the procedure. Informed, written consent was given. The usual time-out protocol was performed immediately prior to the procedure. Using mammographic guidance, sterile technique, 1% lidocaine and an I-125 radioactive seed, core biopsy clip was localized using a lateral approach. The follow-up mammogram images confirm the seed in the expected location and were marked for Dr. Marlou Starks. Follow-up survey of the patient confirms presence of the radioactive seed. Order number of I-125 seed:  [id number]. Total activity:  8.413 millicuries reference Date: May 09, 2020 The patient tolerated the procedure well and was released from the [place]. She was given instructions regarding seed removal. IMPRESSION: Radioactive seed localization left breast. No apparent complications. Electronically Signed   By: Seward Meth  Augustin Coupe M.D.   On: 06/06/2020 14:05   MM RT RADIOACTIVE SEED LOC MAMMO GUIDE  Result Date: 06/06/2020 CLINICAL DATA:  Right breast complex sclerosing lesion for seed localization prior to surgical excision. EXAM: MAMMOGRAPHIC GUIDED RADIOACTIVE SEED LOCALIZATION OF THE right BREAST COMPARISON:  Previous exam(s). FINDINGS: Patient presents for radioactive seed localization prior to right breast surgery. I met with the patient and we discussed the procedure of seed localization including benefits and alternatives. We discussed the high likelihood of a successful procedure. We discussed the risks of the procedure including infection, bleeding, tissue injury and further surgery. We discussed the low dose of radioactivity involved in the procedure.  Informed, written consent was given. The usual time-out protocol was performed immediately prior to the procedure. Using mammographic guidance, sterile technique, 1% lidocaine and an I-125 radioactive seed, calcifications 2 cm superior to the coil biopsy clip (clip displaced 2 cm inferiorly relative to biopsy area) was localized using a lateral approach. The follow-up mammogram images confirm the seed in the expected location and were marked for Dr. Marlou Starks. Follow-up survey of the patient confirms presence of the radioactive seed. Order number of I-125 seed:  [id number]. Total activity:  7.322 millicurie reference Date: May 09, 2020 The patient tolerated the procedure well and was released from the [place]. She was given instructions regarding seed removal. IMPRESSION: Radioactive seed localization right breast. No apparent complications. Electronically Signed   By: Abelardo Diesel M.D.   On: 06/06/2020 14:03    Impression/Plan: We discussed adjuvant radiotherapy today.  I recommend radiation therapy to the left breast in order to reduce risk of local recurrence by half.  She is interested in a hypofractionated regimen based on some personal research she has done and I agree this is a good fit for her.  We talked about the option of a boost treatment after whole breast radiation therapy to further reduce the risk of local recurrence.  She would prefer to pursue 3 weeks of whole breast radiation only and defer a boost.  I think this is reasonable.  Therefore she will undergo 42.56 Gray in 16 fractions to the whole breast and we will hold the boost based on her preferences. I reviewed the logistics, benefits, risks, and potential side effects of this treatment in detail. Risks may include but not necessary be limited to acute and late injury tissue in the radiation fields such as skin irritation (change in color/pigmentation, itching, dryness, pain, peeling). She may experience fatigue. The risk of lung  toxicity or cardiac toxicity is very low based upon the techniques that we discussed today.  The patient asked good questions which I answered to her satisfaction. She is enthusiastic about proceeding with treatment.   She will undergo CT simulation next week and we will start her treatment on November 18 to give her some time to heal from her hysterectomy.  On date of service, in total, I spent 30 minutes on this encounter. This encounter was provided by telemedicine platform by telephone.  The patient opted for telemedicine to maximize safety during the pandemic.  MyChart video was not obtainable. The patient has given verbal consent for this type of encounter and has been advised to only accept a meeting of this type in a secure network environment. The attendants for this meeting include Eppie Gibson  and [name].  During the encounter, Eppie Gibson was located at [facility] Radiation Oncology Department.  Lewanda Perea was located at home.  _____________________________________   Eppie Gibson, MD  This document serves as a record of services personally performed by Eppie Gibson, MD. It was created on his behalf by Clerance Lav, a trained medical scribe. The creation of this record is based on the scribe's personal observations and the provider's statements to them. This document has been checked and approved by the attending provider.

## 2020-07-03 NOTE — Telephone Encounter (Signed)
Let's have her meet with one of our sleep specialists to help further determine the necessity of CPAP therapy vs only providing supplemental oxygen at night given her medical history. There are many different styles of masks that may be tolerable for her.   Thanks, Wille Glaser

## 2020-07-04 ENCOUNTER — Encounter (HOSPITAL_COMMUNITY): Payer: Self-pay

## 2020-07-04 ENCOUNTER — Encounter: Payer: Self-pay | Admitting: *Deleted

## 2020-07-04 ENCOUNTER — Ambulatory Visit: Payer: 59 | Admitting: Radiation Oncology

## 2020-07-04 ENCOUNTER — Telehealth: Payer: Self-pay | Admitting: Oncology

## 2020-07-04 NOTE — Telephone Encounter (Signed)
Pt returning a phone call. Pt can be reached at [phone].

## 2020-07-04 NOTE — Telephone Encounter (Signed)
Spoke with the Kelli Horn and advised her of response per Dr Erin Fulling  I have her scheduled with Dr [name] for a sleep consult on 08/05/20 (first available)  She is again asking for order for nocturnal o2 She is having hysterectomy 11/11 and her surgeon asks that she has o2 in place at home post surgery  Please advise thanks!

## 2020-07-04 NOTE — Patient Instructions (Addendum)
DUE TO COVID-19 ONLY ONE VISITOR IS ALLOWED TO COME WITH YOU AND STAY IN THE WAITING ROOM ONLY DURING PRE OP AND PROCEDURE DAY OF SURGERY. THE 1 VISITOR  MAY VISIT WITH YOU AFTER SURGERY IN YOUR PRIVATE ROOM DURING VISITING HOURS ONLY!  YOU NEED TO HAVE A COVID 19 TEST ON__11-8-21_____ @_______ , THIS TEST MUST BE DONE BEFORE SURGERY,  COVID TESTING SITE [address] [place] [address], IT IS ON THE RIGHT GOING OUT [address] APPROXIMATELY  2 MINUTES PAST [employer] ON THE RIGHT. ONCE YOUR COVID TEST IS COMPLETED,  PLEASE BEGIN THE QUARANTINE INSTRUCTIONS AS OUTLINED IN YOUR HANDOUT.                Kelli Horn  07/04/2020   Your procedure is scheduled on:     07-11-20   Report to [facility] Main  Entrance   Report to admitting at      0800 AM     Call this number if you have problems the morning of surgery [phone]    Remember: Eat a light diet the day before surgery.  Examples including soups, broths, toast, yogurt, mashed potatoes.  Things to avoid include carbonated beverages (fizzy beverages), raw fruits and raw vegetables, or beans  . NO SOLID FOOD AFTER MIDNIGHT YOU MAY HAVE CLEAR LIQUIDS UNTIL 0700 AM THEN NOTHING BY MOUTH  If your bowels are filled with gas, your surgeon will have difficulty visualizing your pelvic organs which increases your surgical risks.    CLEAR LIQUID DIET   Foods Allowed                                                                    BLACK Coffee and tea, regular and decaf                            Plain Jell-O any favor except red or purple                                           Fruit ices (not with fruit pulp)                                    Iced Popsicles                                                            Cranberry, grape and apple juices Sports drinks like Gatorade Lightly seasoned clear broth or consume(fat free) Sugar, honey  syrup  _____________________________________________________________________    BRUSH YOUR TEETH MORNING OF SURGERY AND RINSE YOUR MOUTH OUT, NO CHEWING GUM CANDY OR MINTS.     Take these medicines the morning of surgery with A SIP OF WATER: ROUVASTATIN, INHALERS AND BRING, AMLODIPINE  DO NOT TAKE ANY DIABETIC MEDICATIONS DAY OF YOUR SURGERY  You may not have any metal on your body including hair pins and              piercings  Do not wear jewelry, make-up, lotions, powders or perfumes, deodorant             Do not wear nail polish on your fingernails.  Do not shave  48 hours prior to surgery.     Do not bring valuables to the hospital. [place].  Contacts, dentures or bridgework may not be worn into surgery.  Leave suitcase in the car. After surgery it may be brought to your room.     Patients discharged the day of surgery will not be allowed to drive home. IF YOU ARE HAVING SURGERY AND GOING HOME THE SAME DAY, YOU MUST HAVE AN ADULT TO DRIVE YOU HOME AND BE WITH YOU FOR 24 HOURS. YOU MAY GO HOME BY TAXI OR UBER OR ORTHERWISE, BUT AN ADULT MUST ACCOMPANY YOU HOME AND STAY WITH YOU FOR 24 HOURS.  Name and phone number of your driver:  Special Instructions: N/A              Please read over the following fact sheets you were given: _____________________________________________________________________             [facility] - Preparing for Surgery Before surgery, you can play an important role.  Because skin is not sterile, your skin needs to be as free of germs as possible.  You can reduce the number of germs on your skin by washing with CHG (chlorahexidine gluconate) soap before surgery.  CHG is an antiseptic cleaner which kills germs and bonds with the skin to continue killing germs even after washing. Please DO NOT use if you have an allergy to CHG or antibacterial soaps.  If your skin becomes  reddened/irritated stop using the CHG and inform your nurse when you arrive at Short Stay. Do not shave (including legs and underarms) for at least 48 hours prior to the first CHG shower.  You may shave your face/neck. Please follow these instructions carefully:  1.  Shower with CHG Soap the night before surgery and the  morning of Surgery.  2.  If you choose to wash your hair, wash your hair first as usual with your  normal  shampoo.  3.  After you shampoo, rinse your hair and body thoroughly to remove the  shampoo.                           4.  Use CHG as you would any other liquid soap.  You can apply chg directly  to the skin and wash                       Gently with a scrungie or clean washcloth.  5.  Apply the CHG Soap to your body ONLY FROM THE NECK DOWN.   Do not use on face/ open                           Wound or open sores. Avoid contact with eyes, ears mouth and genitals (private parts).                       Wash face,  Genitals (private parts) with your normal soap.             6.  Wash thoroughly, paying special attention to the area where your surgery  will be performed.  7.  Thoroughly rinse your body with warm water from the neck down.  8.  DO NOT shower/wash with your normal soap after using and rinsing off  the CHG Soap.                9.  Pat yourself dry with a clean towel.            10.  Wear clean pajamas.            11.  Place clean sheets on your bed the night of your first shower and do not  sleep with pets. Day of Surgery : Do not apply any lotions/deodorants the morning of surgery.  Please wear clean clothes to the hospital/surgery center.  FAILURE TO FOLLOW THESE INSTRUCTIONS MAY RESULT IN THE CANCELLATION OF YOUR SURGERY PATIENT SIGNATURE_________________________________  NURSE SIGNATURE__________________________________  ________________________________________________________________________

## 2020-07-04 NOTE — Telephone Encounter (Signed)
Called Kelli Horn and advised her that she needs to have her CPAP and be using it before her surgery on 06/10/20.  She needs to contact her pulmonologist to have this in place. She said she is claustrophobic and has contacted Dr. August Albino office about using oxygen at night instead.  Also advised her that she should keep her appointment to have her teeth removed on 07/09/20.  She said she has already canceled it.  Asked if she could get the appointment back and she said no and has contacted another dental office but can't get an appointment until February.  She is worried about having all her teeth pulled at once and said she only has a problem with 3 teeth that are loose.  She also said she had an x ray 3 months ago that said she has an infection in her front tooth.  She is wondering if Dr. [name] can put her on an antibiotic - amoxicillin TID for 10 days before surgery.

## 2020-07-05 ENCOUNTER — Other Ambulatory Visit: Payer: Self-pay

## 2020-07-05 ENCOUNTER — Encounter: Payer: Self-pay | Admitting: Oncology

## 2020-07-05 ENCOUNTER — Encounter (HOSPITAL_COMMUNITY): Payer: Self-pay

## 2020-07-05 ENCOUNTER — Encounter (HOSPITAL_COMMUNITY)
Admission: RE | Admit: 2020-07-05 | Discharge: 2020-07-05 | Disposition: A | Discharge status: Home / Self Care | Payer: 59 | Source: Ambulatory Visit | Attending: Gynecologic Oncology | Admitting: Gynecologic Oncology

## 2020-07-05 ENCOUNTER — Telehealth: Payer: Self-pay | Admitting: Oncology

## 2020-07-05 DIAGNOSIS — K056 Periodontal disease, unspecified: Secondary | ICD-10-CM

## 2020-07-05 DIAGNOSIS — Z01812 Encounter for preprocedural laboratory examination: Secondary | ICD-10-CM | POA: Diagnosis present

## 2020-07-05 DIAGNOSIS — C541 Malignant neoplasm of endometrium: Secondary | ICD-10-CM

## 2020-07-05 HISTORY — DX: Sleep apnea, unspecified: G47.30

## 2020-07-05 HISTORY — DX: Chronic kidney disease, unspecified: N18.9

## 2020-07-05 HISTORY — DX: Peripheral vascular disease, unspecified: I73.9

## 2020-07-05 LAB — COMPREHENSIVE METABOLIC PANEL
ALT: 17 U/L (ref 0–44)
AST: 16 U/L (ref 15–41)
Albumin: 3.7 g/dL (ref 3.5–5.0)
Alkaline Phosphatase: 78 U/L (ref 38–126)
Anion gap: 11 (ref 5–15)
BUN: 16 mg/dL (ref 8–23)
CO2: 25 mmol/L (ref 22–32)
Calcium: 9.1 mg/dL (ref 8.9–10.3)
Chloride: 104 mmol/L (ref 98–111)
Creatinine, Ser: 0.8 mg/dL (ref 0.44–1.00)
GFR, Estimated: >60 mL/min (ref >60)
Glucose, Bld: 117 mg/dL — ABNORMAL HIGH (ref 70–99)
Potassium: 4.1 mmol/L (ref 3.5–5.1)
Sodium: 140 mmol/L (ref 135–145)
Total Bilirubin: 0.4 mg/dL (ref 0.3–1.2)
Total Protein: 7.3 g/dL (ref 6.5–8.1)

## 2020-07-05 LAB — HEMOGLOBIN A1C
Hgb A1c MFr Bld: 6.1 % — ABNORMAL HIGH (ref 4.8–5.6)
Mean Plasma Glucose: 128.37 mg/dL

## 2020-07-05 LAB — CBC
HCT: 41.5 % (ref 36.0–46.0)
Hemoglobin: 13.3 g/dL (ref 12.0–15.0)
MCH: 27.2 pg (ref 26.0–34.0)
MCHC: 32 g/dL (ref 30.0–36.0)
MCV: 84.9 fL (ref 80.0–100.0)
Platelets: 389 10*3/uL (ref 150–400)
RBC: 4.89 MIL/uL (ref 3.87–5.11)
RDW: 13.9 % (ref 11.5–15.5)
WBC: 9.5 10*3/uL (ref 4.0–10.5)
nRBC: 0 % (ref 0.0–0.2)

## 2020-07-05 LAB — URINALYSIS, ROUTINE W REFLEX MICROSCOPIC
Bilirubin Urine: NEGATIVE
Glucose, UA: NEGATIVE mg/dL
Hgb urine dipstick: NEGATIVE
Ketones, ur: NEGATIVE mg/dL
Leukocytes,Ua: NEGATIVE
Nitrite: NEGATIVE
Protein, ur: NEGATIVE mg/dL
Specific Gravity, Urine: 1.006 (ref 1.005–1.030)
pH: 6 (ref 5.0–8.0)

## 2020-07-05 LAB — GLUCOSE, CAPILLARY: Glucose-Capillary: 119 mg/dL — ABNORMAL HIGH (ref 70–99)

## 2020-07-05 NOTE — Progress Notes (Addendum)
PCP - Claris Gower MD Cardiologist - Peter Martinique lov 05-30-20 epic  PPM/ICD -  Device Orders -  Rep Notified -   Chest x-ray - 02-22-20 epic EKG - 05-29-20 epic Stress Test -  ECHO - 05-13-20 epic Cardiac Cath - 04-23-20 epic  Sleep Study - 06-20-20  CPAP - not yet  Fasting Blood Sugar - 120 Checks Blood Sugar __1___ times a month  Blood Thinner Instructions: Aspirin Instructions:  ERAS Protcol - PRE-SURGERY Ensure or G2-   COVID TEST- 11-8   Anesthesia review: Needs dental work prior to surgery and o2 established until can get CPAP, PAD, COPD, OSA, DM left breast ca  Patient denies shortness of breath, fever, cough and chest pain at PAT appointment   none   All instructions explained to the patient, with a verbal understanding of the material. Patient agrees to go over the instructions while at home for a better understanding. Patient also instructed to self quarantine after being tested for COVID-19. The opportunity to ask questions was provided.

## 2020-07-05 NOTE — Telephone Encounter (Signed)
Called Kelli Horn back and advised her that Dr. [name] is requiring that she have her teeth pulled before surgery because of her increased risk of infection.  Percilla said she is not able to reschedule the appointment and is only concerned about 3 teeth.  She said if the dentist was concerned about infection they would have put her on an antibiotic.  Advised her that we will call her back.

## 2020-07-05 NOTE — Telephone Encounter (Signed)
Left 2 messages to dental medicine regarding referral.  Requested a return call.

## 2020-07-05 NOTE — Telephone Encounter (Signed)
Called and spoke with pt letting her know the info stated by Dr. Erin Fulling and she verbalized understanding and stated she would like for Rx for O2 at night to be sent to DME. Order has been placed. Nothing further needed.

## 2020-07-05 NOTE — Telephone Encounter (Signed)
If her insurance will cover nocturnal oxygen based on the home sleep study, then we can start 2L at night.   Thanks, Wille Glaser

## 2020-07-08 ENCOUNTER — Other Ambulatory Visit: Payer: Self-pay

## 2020-07-08 ENCOUNTER — Other Ambulatory Visit (HOSPITAL_COMMUNITY)
Admission: RE | Admit: 2020-07-08 | Discharge: 2020-07-08 | Disposition: A | Discharge status: Home / Self Care | Payer: 59 | Source: Ambulatory Visit | Attending: Gynecologic Oncology | Admitting: Gynecologic Oncology

## 2020-07-08 ENCOUNTER — Ambulatory Visit
Admission: RE | Admit: 2020-07-08 | Discharge: 2020-07-08 | Disposition: A | Discharge status: Home / Self Care | Payer: 59 | Source: Ambulatory Visit | Attending: Radiation Oncology | Admitting: Radiation Oncology

## 2020-07-08 ENCOUNTER — Telehealth: Payer: Self-pay | Admitting: Pulmonary Disease

## 2020-07-08 ENCOUNTER — Telehealth: Payer: Self-pay | Admitting: Oncology

## 2020-07-08 DIAGNOSIS — Z510 Encounter for antineoplastic radiation therapy: Secondary | ICD-10-CM | POA: Insufficient documentation

## 2020-07-08 DIAGNOSIS — Z20822 Contact with and (suspected) exposure to covid-19: Secondary | ICD-10-CM | POA: Insufficient documentation

## 2020-07-08 DIAGNOSIS — Z01812 Encounter for preprocedural laboratory examination: Secondary | ICD-10-CM | POA: Insufficient documentation

## 2020-07-08 DIAGNOSIS — C541 Malignant neoplasm of endometrium: Secondary | ICD-10-CM | POA: Diagnosis not present

## 2020-07-08 LAB — SARS CORONAVIRUS 2 (TAT 6-24 HRS): SARS Coronavirus 2: NEGATIVE

## 2020-07-08 NOTE — Telephone Encounter (Signed)
Hey, unable to provide patient with oxygen. Per the sleep study she does not qualify. If you have any questions call [phone].

## 2020-07-08 NOTE — Telephone Encounter (Signed)
Dental medicine called back and they are not able to work patient in before surgery.  They said to call back after surgery to set up an appointment with Dr. Sharon Seller.

## 2020-07-08 NOTE — Progress Notes (Signed)
Anesthesia Chart Review   Case: [id number] Date/Time: 07/11/20 0945   Procedure: XI ROBOTIC ASSISTED TOTAL HYSTERECTOMY WITH BILATERAL SALPINGO OOPHORECTOMY (N/A )   Anesthesia type: General   Pre-op diagnosis: ENDOMETRIAL CANCER   Location: [facility]OM 02 / [facility]   Surgeons: [name], MD      DISCUSSION:64 y.o. former smoker (20 pack years, quit 04/16/04) with h/o HTN, asthma, COPD, sleep apnea, DM II, PVD, CKD, breast cancer, endometrial cancer scheduled for above procedure 07/11/2020 with Dr. [name].   Per cardiology preoperative risk assessment 06/17/2020, "Chart reviewed as part of pre-operative protocol coverage. Patient was contacted 06/17/2020 in reference to pre-operative risk assessment for pending surgery as outlined below.  Kelli Horn was last seen on 05/30/20 by Dr. Martinique.  Since that day, Kelli Horn has done well. She has nonobstructive disease by heart cath 04/23/20. She just had breast surgery and did well. Therefore, based on ACC/AHA guidelines, the patient would be at acceptable risk for the planned procedure without further cardiovascular testing."  Per pulmonology 05/17/2020, "Patient is scheduled for breast surgery later this month. From a pulmonary standpoint, she is low-moderate risk for post-operative respiratory complications given her history of severe obstructive defect on pulmonary function tests and active symptoms at time of visit. She has been prescribed trelegy ellipta to optimize her asthma with as needed albuterol. There is concern for underlying sleep apnea in which she will be undergoing workup in near future. Should there be concern for prolonged effects of anesthesia in the post-op recovery period, she would benefit from CPAP or BIPAP therapy during recovery."  Per Dr. Serita Grit note 06/14/2020, "While I believe a minimally invasive route should be feasible, she will likely have difficulty with ventilation and Trendelenburg positioning  and be at increased risk for postoperative hypoxia.  Therefore she will need to have her sleep assessment preoperatively and will need to have either CPAP or home O2 in place prior to [name] proceeding."  Pt reports at PAT visit she has an upcoming appointment to discuss CPAP.  She also reports she spoke with pulmonology office 07/05/2020 to request O2, pulmonology checking with insurance.  Dr. [name] states that this is required prior to surgery.   Pt discussed that prior to surgery Dr. [name] has requested she be seen by a dentist.  Discussed with Dr. [name] who requests that anesthesia evaluate her at PAT.  Seen by Dr. Gifford Shave.  Pt with loose bottom teeth, discussed risk of these teeth becoming dislodged or damaged, pt understands. If pt needs to be seen by a dentist prior to surgery due to concerns regarding infection this would be determined by Dr. [name].   VS: BP (!) 157/77   Pulse 78   Temp 36.6 C (Oral)   Resp 18   Ht 5\' 10"  (1.778 m)   Wt (!) 137.4 kg   LMP 12/09/2013   SpO2 97%   BMI 43.48 kg/m   PROVIDERS: [name], MD is PCP   Martinique, Peter, MD is Cardiologist  LABS: Labs reviewed: Acceptable for surgery. (all labs ordered are listed, but only abnormal results are displayed)  Labs Reviewed  COMPREHENSIVE METABOLIC PANEL - Abnormal; Notable for the following components:      Result Value   Glucose, Bld 117 (*)    All other components within normal limits  URINALYSIS, ROUTINE W REFLEX MICROSCOPIC - Abnormal; Notable for the following components:   Color, Urine STRAW (*)    All other components within normal  limits  HEMOGLOBIN A1C - Abnormal; Notable for the following components:   Hgb A1c MFr Bld 6.1 (*)    All other components within normal limits  GLUCOSE, CAPILLARY - Abnormal; Notable for the following components:   Glucose-Capillary 119 (*)    All other components within normal limits  CBC  TYPE AND SCREEN     IMAGES:   EKG: 05/29/20 Rate 83 bpm   NSR Possible left atrial enlargement  Low voltage QRS  CV: Echo 05/13/2020 IMPRESSIONS    1. Left ventricular ejection fraction, by estimation, is 55 to 60%. The  left ventricle has normal function. The left ventricle has no regional  wall motion abnormalities. There is mild concentric left ventricular  hypertrophy. Left ventricular diastolic  parameters are indeterminate. Elevated left ventricular end-diastolic  pressure.  2. Right ventricular systolic function is normal. The right ventricular  size is normal. Tricuspid regurgitation signal is inadequate for assessing  PA pressure.  3. Left atrial size was mildly dilated.  4. Right atrial size was mildly dilated.  5. The mitral valve is normal in structure. Trivial mitral valve  regurgitation. No evidence of mitral stenosis. Moderate mitral annular  calcification.  6. The aortic valve is tricuspid. There is mild calcification of the  aortic valve. There is mild thickening of the aortic valve. Aortic valve  regurgitation is not visualized. Mild aortic valve sclerosis is present,  with no evidence of aortic valve  stenosis.  7. The inferior vena cava is normal in size with greater than 50%  respiratory variability, suggesting right atrial pressure of 3 mmHg.   Conclusion(s)/Recommendation(s): Otherwise normal echocardiogram, with  minor abnormalities described in the report.   Cardiac Cath 04/23/2020  Mid RCA lesion is 25% stenosed.  Mid LAD lesion is 25% stenosed.  The left ventricular systolic function is normal.  LV end diastolic pressure is mildly elevated.  The left ventricular ejection fraction is greater than 65% by visual estimate.   1. Nonobstructive CAD 2. Normal LV function 3. Mildly elevated LVEDP 4. Hypertensive 5. No significant right innominate or subclavian pressure gradient.   Plan: will optimize BP control. Will arrange PFTs, Echo, Carotid and LE arterial dopplers. She is intolerant of beta  blockers. Will initiate amlodipine in addition to enalapril for BP control.   Stress Test 12/26/2015  Nuclear stress EF: 62%.  There was no ST segment deviation noted during stress.  No T wave inversion was noted during stress.  The study is normal.  This is a low risk study.  The left ventricular ejection fraction is normal (55-65%).  Past Medical History:  Diagnosis Date  . Acid reflux   . Anxiety   . Asthma   . 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 Chronic kidney disease    Right side question infarction  . COPD (chronic obstructive pulmonary disease) ([place])   . Diabetes mellitus without complication ([facility])    no meds  . Hyperlipidemia   . Hypertension   . PAD (peripheral artery disease) ([place])   . Peripheral vascular disease ([place])    severe athrosclerosis  . Sleep apnea     no cpap at this time. Sleep study done Jun 20 2020    Past Surgical History:  Procedure Laterality Date  . APPENDECTOMY    . BIOPSY  06/27/2020   Procedure: BIOPSY;  Surgeon: [name], MD;  [facility]: [facility];  Service: Endoscopy;;  . BREAST LUMPECTOMY WITH RADIOACTIVE SEED AND SENTINEL LYMPH NODE BIOPSY  Bilateral 06/07/2020   Procedure: BILATERAL BREAST LUMPECTOMY WITH RADIOACTIVE SEED AND LEFT SENTINEL LYMPH NODE BIOPSY;  Surgeon: [name], MD;  Location: [facility];  Service: General;  Laterality: Bilateral;  PEC BLOCK  . COLONOSCOPY WITH PROPOFOL N/A 06/27/2020   Procedure: COLONOSCOPY WITH PROPOFOL;  Surgeon: [name], MD;  [facility];  Service: Endoscopy;  Laterality: N/A;  . LEFT HEART CATH AND CORONARY ANGIOGRAPHY N/A 04/23/2020   Procedure: LEFT HEART CATH AND CORONARY ANGIOGRAPHY;  Surgeon: Martinique, Peter M, MD;  Location: [facility];  Service: Cardiovascular;  Laterality: N/A;  . POLYPECTOMY  06/27/2020   Procedure: POLYPECTOMY;  Surgeon: [name], MD;  [facility];  Service: Endoscopy;;     MEDICATIONS: . amoxicillin (AMOXIL) 500 MG capsule  . albuterol (PROVENTIL HFA;VENTOLIN HFA) 108 (90 Base) MCG/ACT inhaler  . albuterol (PROVENTIL) (2.5 MG/3ML) 0.083% nebulizer solution  . amLODipine (NORVASC) 2.5 MG tablet  . aspirin EC 81 MG tablet  . b complex vitamins tablet  . carvedilol (COREG) 12.5 MG tablet  . chlorthalidone (HYGROTON) 25 MG tablet  . enalapril (VASOTEC) 10 MG tablet  . Fluticasone-Umeclidin-Vilant (TRELEGY ELLIPTA) 100-62.5-25 MCG/INH AEPB  . HYDROcodone-acetaminophen (NORCO/VICODIN) 5-325 MG tablet  . hydrOXYzine (VISTARIL) 25 MG capsule  . ibuprofen (ADVIL) 200 MG tablet  . methocarbamol (ROBAXIN) 750 MG tablet  . nitroGLYCERIN (NITROSTAT) 0.4 MG SL tablet  . Polyethyl Glycol-Propyl Glycol (SYSTANE ULTRA) 0.4-0.3 % SOLN  . Polyvinyl Alcohol-Povidone (REFRESH OP)  . rosuvastatin (CRESTOR) 5 MG tablet  . vitamin B-12 (CYANOCOBALAMIN) 1000 MCG tablet   No current facility-administered medications for this encounter.    Konrad Felix, PA-C [facility] [phone]

## 2020-07-08 NOTE — Telephone Encounter (Signed)
Called Kelli Horn and advised her that Dental Medicine does not have an appointment this week but said we can call back to have her scheduled after her surgery.  Also discussed that we are checking with Dr. [name] if surgery will proceed without having it done. She mentioned that she is taking Amoxicillin (10 day prescription - started last Thursday) that was given to her by her first dentist.  Also asked if she has home oxygen yet and she said her pulmonologist's office is working on Government social research officer.  She said she is willing to pick up a CPAP if that means surgery will take place.  She said we would need to call them to have the order placed. She said she really wants to have the surgery on 11/11 because her vascular procedure is being delayed so that she can have surgery.  Advised her to call back if she hears about the oxygen and that I will call her back with updates.

## 2020-07-09 ENCOUNTER — Telehealth: Payer: Self-pay | Admitting: Cardiology

## 2020-07-09 ENCOUNTER — Telehealth: Payer: Self-pay | Admitting: Oncology

## 2020-07-09 ENCOUNTER — Telehealth: Payer: Self-pay | Admitting: Hematology

## 2020-07-09 DIAGNOSIS — Z510 Encounter for antineoplastic radiation therapy: Secondary | ICD-10-CM | POA: Diagnosis not present

## 2020-07-09 NOTE — Telephone Encounter (Signed)
The problem with having PAD procedure done first is that she would be on dual antiplatelet therapy and then surgery would be postponed for much longer due to bleeding risk. She should go ahead with her surgery. Her CV risk is low from a cardiac standpoint. Pulmonary can weigh in on her risk from a breathing perspective but I would think she would be OK given clinical improvement on inhalers.   Darsi Tien Martinique MD, [facility]

## 2020-07-09 NOTE — Telephone Encounter (Signed)
Called Kelli Horn and advised her that per Dr. [name] - she can keep her overnight after surgery but she must go home the next day.  She cannot justify keeping her longer. Also that it would be ideal for her to wait for the surgery until she can get the CPAP but that she can still proceed with surgery on Thursday.  [name] said she would like to proceed with surgery on Thursday and knows that she may need to stay one night and needs to go home the next day.

## 2020-07-09 NOTE — Telephone Encounter (Signed)
Spoke to patient Dr.Jordan's advice given. 

## 2020-07-09 NOTE — Telephone Encounter (Signed)
Scheduled appt per 11/8 sch msg - mailed reminder letter with appt date and time

## 2020-07-09 NOTE — Telephone Encounter (Signed)
She does not have many oxygen desaturations throughout the study and she only drops to 84% at the lowest. She would benefit from CPAP therapy due to central and obstructive apneas. If she does CPAP therapy, she does not likely need supplemental oxygen with the CPAP.   We can order her an overnight oximetry to be done (without CPAP) to determine if she would qualify for home oxygen alone.   She has a sleep appointment coming up which all of this can be discussed in detail at that time.   Thanks, Wille Glaser

## 2020-07-09 NOTE — Telephone Encounter (Signed)
Order was placed to [facility] on 07/05/2020 nocturnal oxygen at 2L   I have spoken to [place] with [facility], who stated that patient does not qualify for nocturnal oxygen based off of HST. An ONO on cpap is needed to qualify patient for nocturnal oxygen.  Routing to Dr. Erin Fulling as an Juluis Rainier.

## 2020-07-09 NOTE — Telephone Encounter (Signed)
Routing to Dr. Doug Sou RN.

## 2020-07-09 NOTE — Telephone Encounter (Signed)
Spoke to patient she stated she would like Dr.Jordan's advice.Stated she is scheduled for robotic hysterectomy Thurs 07/11/20.Stated surgeon told her she is high risk.Stated she will be tilted back on table for appox 2 hours.She is concerned this will put pressure on her lungs.She wanted her to have a cpap at home or O2.Stated cpap on back order and insurance denied home O2.Stated she was told this cancer is not life threatening.She was thinking maybe she should wait and have hysterectomy done next year.She cannot walk due to pad.She would like to have pv procedure done by Dr.Berry first.I will send message to [place] for advice.

## 2020-07-09 NOTE — Telephone Encounter (Signed)
Kelli Horn called and asked if it is ok for her radiation markings and stickers to come off because she has to use the CHG soap before surgery on Thursday.  Called [facility] and spoke to [name], [place].  She said it is ok if the markings/stickers wash off.  Called [name] back and let her know.  Also discussed that she does not qualify for home oxygen and that I was informed that CPAP's are on backorder.  Discussed that we will call her the decision about surgery this afternoon.

## 2020-07-09 NOTE — Telephone Encounter (Signed)
Patient is requesting to speak with Dr. Doug Sou nurse regarding appointments needed for clearance. She states she will discuss further when she speaks with the nurse. Please call.

## 2020-07-10 ENCOUNTER — Telehealth: Payer: Self-pay | Admitting: Oncology

## 2020-07-10 ENCOUNTER — Telehealth: Payer: Self-pay

## 2020-07-10 ENCOUNTER — Ambulatory Visit: Payer: 59 | Admitting: Cardiovascular Disease

## 2020-07-10 MED ORDER — DEXTROSE 5 % IV SOLN
3.0000 g | INTRAVENOUS | Status: AC
  Administered 2020-07-11: 3 g via INTRAVENOUS
  Filled 2020-07-10: qty 3

## 2020-07-10 NOTE — Telephone Encounter (Signed)
Kelli  Horn states that she understands her written pre op instructions.  She is aware that she has a 24 hour overnight saty and her ide has to be there 07-12-20 at 0800 to pick her up.

## 2020-07-10 NOTE — Telephone Encounter (Signed)
Called Tanis back and advised her that the surgery has been approved and for her to stay less than 24 hours as outpatient status.  Advised her to make sure her ride is available by 8 am the morning after surgery. Discussed that Barbaraann Share, RN will be calling her to go over her preop instructions.  She verbalized understanding and agreement.

## 2020-07-10 NOTE — Telephone Encounter (Signed)
Haylo called and said she received a call from the preservice center advising her that her surgery has not been approved by her insurance and that she will owe $17000.00 tomorrow.  She said she is not able to afford that and maybe it is a sign that she should reschedule surgery until after she has her teeth fixed and has her CPAP.  Advised her that we are working on getting it authorized and that I will call her back in a few hours.

## 2020-07-11 ENCOUNTER — Other Ambulatory Visit: Payer: Self-pay

## 2020-07-11 ENCOUNTER — Encounter (HOSPITAL_COMMUNITY)
Admission: RE | Disposition: A | Discharge status: Home / Self Care | Payer: Self-pay | Source: Home / Self Care | Attending: Gynecologic Oncology

## 2020-07-11 ENCOUNTER — Ambulatory Visit (HOSPITAL_COMMUNITY): Payer: 59 | Admitting: Certified Registered"

## 2020-07-11 ENCOUNTER — Ambulatory Visit (HOSPITAL_COMMUNITY)
Admission: RE | Admit: 2020-07-11 | Discharge: 2020-07-11 | Disposition: A | Discharge status: Home / Self Care | Payer: 59 | Attending: Gynecologic Oncology | Admitting: Gynecologic Oncology

## 2020-07-11 ENCOUNTER — Encounter (HOSPITAL_COMMUNITY): Payer: Self-pay | Admitting: Gynecologic Oncology

## 2020-07-11 ENCOUNTER — Ambulatory Visit (HOSPITAL_COMMUNITY): Payer: 59 | Admitting: Physician Assistant

## 2020-07-11 DIAGNOSIS — I2510 Atherosclerotic heart disease of native coronary artery without angina pectoris: Secondary | ICD-10-CM | POA: Insufficient documentation

## 2020-07-11 DIAGNOSIS — Z79899 Other long term (current) drug therapy: Secondary | ICD-10-CM | POA: Diagnosis not present

## 2020-07-11 DIAGNOSIS — Z7951 Long term (current) use of inhaled steroids: Secondary | ICD-10-CM | POA: Insufficient documentation

## 2020-07-11 DIAGNOSIS — Z803 Family history of malignant neoplasm of breast: Secondary | ICD-10-CM | POA: Insufficient documentation

## 2020-07-11 DIAGNOSIS — Z7982 Long term (current) use of aspirin: Secondary | ICD-10-CM | POA: Diagnosis not present

## 2020-07-11 DIAGNOSIS — J449 Chronic obstructive pulmonary disease, unspecified: Secondary | ICD-10-CM | POA: Insufficient documentation

## 2020-07-11 DIAGNOSIS — I739 Peripheral vascular disease, unspecified: Secondary | ICD-10-CM | POA: Diagnosis not present

## 2020-07-11 DIAGNOSIS — N841 Polyp of cervix uteri: Secondary | ICD-10-CM | POA: Insufficient documentation

## 2020-07-11 DIAGNOSIS — C50912 Malignant neoplasm of unspecified site of left female breast: Secondary | ICD-10-CM | POA: Insufficient documentation

## 2020-07-11 DIAGNOSIS — Z170 Estrogen receptor positive status [ER+]: Secondary | ICD-10-CM | POA: Insufficient documentation

## 2020-07-11 DIAGNOSIS — E119 Type 2 diabetes mellitus without complications: Secondary | ICD-10-CM | POA: Insufficient documentation

## 2020-07-11 DIAGNOSIS — Z87891 Personal history of nicotine dependence: Secondary | ICD-10-CM | POA: Insufficient documentation

## 2020-07-11 DIAGNOSIS — C541 Malignant neoplasm of endometrium: Secondary | ICD-10-CM

## 2020-07-11 DIAGNOSIS — Z6841 Body Mass Index (BMI) 40.0 and over, adult: Secondary | ICD-10-CM | POA: Diagnosis not present

## 2020-07-11 DIAGNOSIS — D259 Leiomyoma of uterus, unspecified: Secondary | ICD-10-CM | POA: Diagnosis not present

## 2020-07-11 DIAGNOSIS — N840 Polyp of corpus uteri: Secondary | ICD-10-CM | POA: Diagnosis not present

## 2020-07-11 HISTORY — PX: ROBOTIC ASSISTED TOTAL HYSTERECTOMY WITH BILATERAL SALPINGO OOPHERECTOMY: SHX6086

## 2020-07-11 LAB — GLUCOSE, CAPILLARY
Glucose-Capillary: 119 mg/dL — ABNORMAL HIGH (ref 70–99)
Glucose-Capillary: 140 mg/dL — ABNORMAL HIGH (ref 70–99)

## 2020-07-11 LAB — TYPE AND SCREEN
ABO/RH(D): O POS
Antibody Screen: NEGATIVE

## 2020-07-11 LAB — ABO/RH: ABO/RH(D): O POS

## 2020-07-11 SURGERY — HYSTERECTOMY, TOTAL, ROBOT-ASSISTED, LAPAROSCOPIC, WITH BILATERAL SALPINGO-OOPHORECTOMY
Anesthesia: General

## 2020-07-11 MED ORDER — ONDANSETRON HCL 4 MG/2ML IJ SOLN
INTRAMUSCULAR | Status: DC | PRN
  Administered 2020-07-11: 4 mg via INTRAVENOUS

## 2020-07-11 MED ORDER — BUPIVACAINE HCL 0.25 % IJ SOLN
INTRAMUSCULAR | Status: AC
  Filled 2020-07-11: qty 1

## 2020-07-11 MED ORDER — PHENYLEPHRINE 40 MCG/ML (10ML) SYRINGE FOR IV PUSH (FOR BLOOD PRESSURE SUPPORT)
PREFILLED_SYRINGE | INTRAVENOUS | Status: DC | PRN
  Administered 2020-07-11 (×3): 40 ug via INTRAVENOUS

## 2020-07-11 MED ORDER — PHENYLEPHRINE HCL-NACL 10-0.9 MG/250ML-% IV SOLN
INTRAVENOUS | Status: AC
  Filled 2020-07-11: qty 250

## 2020-07-11 MED ORDER — DEXAMETHASONE SODIUM PHOSPHATE 10 MG/ML IJ SOLN
INTRAMUSCULAR | Status: DC | PRN
  Administered 2020-07-11: 4 mg via INTRAVENOUS

## 2020-07-11 MED ORDER — PHENYLEPHRINE HCL-NACL 10-0.9 MG/250ML-% IV SOLN
INTRAVENOUS | Status: DC | PRN
  Administered 2020-07-11: 20 ug/min via INTRAVENOUS

## 2020-07-11 MED ORDER — LIDOCAINE 20MG/ML (2%) 15 ML SYRINGE OPTIME
INTRAMUSCULAR | Status: DC | PRN
  Administered 2020-07-11: 1.5 mg/kg/h via INTRAVENOUS

## 2020-07-11 MED ORDER — FENTANYL CITRATE (PF) 250 MCG/5ML IJ SOLN
INTRAMUSCULAR | Status: DC | PRN
  Administered 2020-07-11: 50 ug via INTRAVENOUS
  Administered 2020-07-11: 100 ug via INTRAVENOUS
  Administered 2020-07-11: 50 ug via INTRAVENOUS

## 2020-07-11 MED ORDER — ROCURONIUM BROMIDE 10 MG/ML (PF) SYRINGE
PREFILLED_SYRINGE | INTRAVENOUS | Status: DC | PRN
  Administered 2020-07-11: 100 mg via INTRAVENOUS

## 2020-07-11 MED ORDER — ORAL CARE MOUTH RINSE: 15.0000 mL | Freq: Once | OROMUCOSAL | Status: AC

## 2020-07-11 MED ORDER — LACTATED RINGERS IV SOLN: INTRAVENOUS | Status: DC

## 2020-07-11 MED ORDER — ROCURONIUM BROMIDE 10 MG/ML (PF) SYRINGE
PREFILLED_SYRINGE | INTRAVENOUS | Status: AC
  Filled 2020-07-11: qty 10

## 2020-07-11 MED ORDER — CELECOXIB 200 MG PO CAPS
400.0000 mg | ORAL_CAPSULE | ORAL | Status: AC
  Administered 2020-07-11: 400 mg via ORAL
  Filled 2020-07-11: qty 2

## 2020-07-11 MED ORDER — ENOXAPARIN SODIUM 40 MG/0.4ML ~~LOC~~ SOLN
40.0000 mg | SUBCUTANEOUS | Status: AC
  Administered 2020-07-11: 40 mg via SUBCUTANEOUS
  Filled 2020-07-11: qty 0.4

## 2020-07-11 MED ORDER — DEXAMETHASONE SODIUM PHOSPHATE 10 MG/ML IJ SOLN
INTRAMUSCULAR | Status: AC
  Filled 2020-07-11: qty 1

## 2020-07-11 MED ORDER — ACETAMINOPHEN 500 MG PO TABS
1000.0000 mg | ORAL_TABLET | ORAL | Status: AC
  Administered 2020-07-11: 1000 mg via ORAL
  Filled 2020-07-11: qty 2

## 2020-07-11 MED ORDER — PROMETHAZINE HCL 25 MG/ML IJ SOLN
6.2500 mg | INTRAMUSCULAR | Status: DC | PRN
  Administered 2020-07-11: 6.25 mg via INTRAVENOUS

## 2020-07-11 MED ORDER — SUGAMMADEX SODIUM 200 MG/2ML IV SOLN
INTRAVENOUS | Status: DC | PRN
  Administered 2020-07-11: 280 mg via INTRAVENOUS

## 2020-07-11 MED ORDER — PROPOFOL 10 MG/ML IV BOLUS
INTRAVENOUS | Status: AC
  Filled 2020-07-11: qty 20

## 2020-07-11 MED ORDER — OXYCODONE HCL 5 MG PO TABS
5.0000 mg | ORAL_TABLET | Freq: Once | ORAL | Status: AC
  Administered 2020-07-11: 5 mg via ORAL

## 2020-07-11 MED ORDER — MIDAZOLAM HCL 2 MG/2ML IJ SOLN
INTRAMUSCULAR | Status: DC | PRN
  Administered 2020-07-11: 2 mg via INTRAVENOUS

## 2020-07-11 MED ORDER — FENTANYL CITRATE (PF) 100 MCG/2ML IJ SOLN
INTRAMUSCULAR | Status: AC
  Filled 2020-07-11: qty 2

## 2020-07-11 MED ORDER — OXYCODONE HCL 5 MG PO TABS
ORAL_TABLET | ORAL | Status: AC
  Filled 2020-07-11: qty 1

## 2020-07-11 MED ORDER — PROPOFOL 10 MG/ML IV BOLUS
INTRAVENOUS | Status: DC | PRN
  Administered 2020-07-11: 180 mg via INTRAVENOUS

## 2020-07-11 MED ORDER — ONDANSETRON HCL 4 MG/2ML IJ SOLN
INTRAMUSCULAR | Status: AC
  Filled 2020-07-11: qty 2

## 2020-07-11 MED ORDER — FENTANYL CITRATE (PF) 100 MCG/2ML IJ SOLN
25.0000 ug | INTRAMUSCULAR | Status: DC | PRN
  Administered 2020-07-11: 50 ug via INTRAVENOUS

## 2020-07-11 MED ORDER — LIDOCAINE 2% (20 MG/ML) 5 ML SYRINGE
INTRAMUSCULAR | Status: AC
  Filled 2020-07-11: qty 5

## 2020-07-11 MED ORDER — GABAPENTIN 300 MG PO CAPS
300.0000 mg | ORAL_CAPSULE | ORAL | Status: AC
  Administered 2020-07-11: 300 mg via ORAL
  Filled 2020-07-11: qty 1

## 2020-07-11 MED ORDER — MIDAZOLAM HCL 2 MG/2ML IJ SOLN
INTRAMUSCULAR | Status: AC
  Filled 2020-07-11: qty 2

## 2020-07-11 MED ORDER — BUPIVACAINE HCL 0.25 % IJ SOLN
INTRAMUSCULAR | Status: DC | PRN
  Administered 2020-07-11: 20 mL

## 2020-07-11 MED ORDER — CHLORHEXIDINE GLUCONATE 0.12 % MT SOLN
15.0000 mL | Freq: Once | OROMUCOSAL | Status: AC
  Administered 2020-07-11: 15 mL via OROMUCOSAL

## 2020-07-11 MED ORDER — LACTATED RINGERS IR SOLN
Status: DC | PRN
  Administered 2020-07-11: 1000 mL

## 2020-07-11 MED ORDER — SODIUM CHLORIDE 0.9% FLUSH: 3.0000 mL | Freq: Two times a day (BID) | INTRAVENOUS | Status: DC

## 2020-07-11 MED ORDER — DEXAMETHASONE SODIUM PHOSPHATE 4 MG/ML IJ SOLN: 4.0000 mg | INTRAMUSCULAR | Status: DC

## 2020-07-11 MED ORDER — SCOPOLAMINE 1 MG/3DAYS TD PT72
1.0000 | MEDICATED_PATCH | TRANSDERMAL | Status: DC
  Administered 2020-07-11: 1.5 mg via TRANSDERMAL
  Filled 2020-07-11: qty 1

## 2020-07-11 MED ORDER — PROMETHAZINE HCL 25 MG/ML IJ SOLN
INTRAMUSCULAR | Status: AC
  Filled 2020-07-11: qty 1

## 2020-07-11 MED ORDER — LIDOCAINE 2% (20 MG/ML) 5 ML SYRINGE
INTRAMUSCULAR | Status: DC | PRN
  Administered 2020-07-11: 100 mg via INTRAVENOUS

## 2020-07-11 SURGICAL SUPPLY — 77 items
ADH SKN CLS APL DERMABOND .7 (GAUZE/BANDAGES/DRESSINGS) ×1
AGENT HMST KT MTR STRL THRMB (HEMOSTASIS)
APL ESCP 34 STRL LF DISP (HEMOSTASIS)
APPLICATOR SURGIFLO ENDO (HEMOSTASIS) IMPLANT
BACTOSHIELD CHG 4% 4OZ (MISCELLANEOUS) ×1
BAG LAPAROSCOPIC 12 15 PORT 16 (BASKET) IMPLANT
BAG RETRIEVAL 12/15 (BASKET)
BAG SPEC RTRVL LRG 6X4 10 (ENDOMECHANICALS)
BLADE SURG SZ10 CARB STEEL (BLADE) IMPLANT
CATH COUDE 5CC RIBBED (CATHETERS) IMPLANT
CATH FOLEY 2WAY SLVR  5CC 14FR (CATHETERS) ×2
CATH FOLEY 2WAY SLVR 5CC 14FR (CATHETERS) IMPLANT
CATH RIBBED COUDE 5CC (CATHETERS) ×2
COVER BACK TABLE 60X90IN (DRAPES) ×2 IMPLANT
COVER TIP SHEARS 8 DVNC (MISCELLANEOUS) ×1 IMPLANT
COVER TIP SHEARS 8MM DA VINCI (MISCELLANEOUS) ×2
COVER WAND RF STERILE (DRAPES) IMPLANT
DECANTER SPIKE VIAL GLASS SM (MISCELLANEOUS) ×1 IMPLANT
DERMABOND ADVANCED (GAUZE/BANDAGES/DRESSINGS) ×1
DERMABOND ADVANCED .7 DNX12 (GAUZE/BANDAGES/DRESSINGS) ×1 IMPLANT
DRAPE ARM DVNC X/XI (DISPOSABLE) ×4 IMPLANT
DRAPE COLUMN DVNC XI (DISPOSABLE) ×1 IMPLANT
DRAPE DA VINCI XI ARM (DISPOSABLE) ×8
DRAPE DA VINCI XI COLUMN (DISPOSABLE) ×2
DRAPE SHEET LG 3/4 BI-LAMINATE (DRAPES) ×2 IMPLANT
DRAPE SURG IRRIG POUCH 19X23 (DRAPES) ×2 IMPLANT
DRSG OPSITE POSTOP 4X6 (GAUZE/BANDAGES/DRESSINGS) IMPLANT
DRSG OPSITE POSTOP 4X8 (GAUZE/BANDAGES/DRESSINGS) IMPLANT
ELECT PENCIL ROCKER SW 15FT (MISCELLANEOUS) IMPLANT
ELECT REM PT RETURN 15FT ADLT (MISCELLANEOUS) ×2 IMPLANT
GLOVE BIO SURGEON STRL SZ 6 (GLOVE) ×8 IMPLANT
GLOVE BIO SURGEON STRL SZ 6.5 (GLOVE) ×4 IMPLANT
GOWN STRL REUS W/ TWL LRG LVL3 (GOWN DISPOSABLE) ×4 IMPLANT
GOWN STRL REUS W/TWL LRG LVL3 (GOWN DISPOSABLE) ×8
HOLDER FOLEY CATH W/STRAP (MISCELLANEOUS) ×1 IMPLANT
IRRIG SUCT STRYKERFLOW 2 WTIP (MISCELLANEOUS) ×2
IRRIGATION SUCT STRKRFLW 2 WTP (MISCELLANEOUS) ×1 IMPLANT
KIT PROCEDURE DA VINCI SI (MISCELLANEOUS)
KIT PROCEDURE DVNC SI (MISCELLANEOUS) IMPLANT
KIT TURNOVER KIT A (KITS) IMPLANT
MANIPULATOR UTERINE 4.5 ZUMI (MISCELLANEOUS) ×2 IMPLANT
NDL SPNL 18GX3.5 QUINCKE PK (NEEDLE) IMPLANT
NEEDLE HYPO 22GX1.5 SAFETY (NEEDLE) ×2 IMPLANT
NEEDLE SPNL 18GX3.5 QUINCKE PK (NEEDLE) IMPLANT
OBTURATOR OPTICAL STANDARD 8MM (TROCAR) ×2
OBTURATOR OPTICAL STND 8 DVNC (TROCAR) ×1
OBTURATOR OPTICALSTD 8 DVNC (TROCAR) ×1 IMPLANT
PACK ROBOT GYN CUSTOM WL (TRAY / TRAY PROCEDURE) ×2 IMPLANT
PAD POSITIONING PINK XL (MISCELLANEOUS) ×2 IMPLANT
PORT ACCESS TROCAR AIRSEAL 12 (TROCAR) ×1 IMPLANT
PORT ACCESS TROCAR AIRSEAL 5M (TROCAR) ×1
POUCH SPECIMEN RETRIEVAL 10MM (ENDOMECHANICALS) IMPLANT
RETRACTOR LAPSCP 12X46 CVD (ENDOMECHANICALS) IMPLANT
RTRCTR LAPSCP 12X46 CVD (ENDOMECHANICALS) ×2
SCRUB CHG 4% DYNA-HEX 4OZ (MISCELLANEOUS) ×1 IMPLANT
SEAL CANN UNIV 5-8 DVNC XI (MISCELLANEOUS) ×3 IMPLANT
SEAL XI 5MM-8MM UNIVERSAL (MISCELLANEOUS) ×8
SET TRI-LUMEN FLTR TB AIRSEAL (TUBING) ×2 IMPLANT
SPONGE LAP 18X18 RF (DISPOSABLE) IMPLANT
SURGIFLO W/THROMBIN 8M KIT (HEMOSTASIS) IMPLANT
SUT MNCRL AB 4-0 PS2 18 (SUTURE) IMPLANT
SUT PDS AB 1 TP1 96 (SUTURE) IMPLANT
SUT VIC AB 0 CT1 27 (SUTURE) ×4
SUT VIC AB 0 CT1 27XBRD ANTBC (SUTURE) IMPLANT
SUT VIC AB 2-0 CT1 27 (SUTURE)
SUT VIC AB 2-0 CT1 TAPERPNT 27 (SUTURE) IMPLANT
SUT VIC AB 3-0 SH 27 (SUTURE) ×2
SUT VIC AB 3-0 SH 27XBRD (SUTURE) IMPLANT
SUT VIC AB 4-0 PS2 18 (SUTURE) ×4 IMPLANT
SYR 10ML LL (SYRINGE) IMPLANT
SYR 20ML LL LF (SYRINGE) ×1 IMPLANT
TOWEL OR NON WOVEN STRL DISP B (DISPOSABLE) ×2 IMPLANT
TRAP SPECIMEN MUCUS 40CC (MISCELLANEOUS) IMPLANT
TRAY FOLEY MTR SLVR 16FR STAT (SET/KITS/TRAYS/PACK) ×2 IMPLANT
TROCAR XCEL NON-BLD 5MMX100MML (ENDOMECHANICALS) IMPLANT
UNDERPAD 30X36 HEAVY ABSORB (UNDERPADS AND DIAPERS) ×2 IMPLANT
WATER STERILE IRR 1000ML POUR (IV SOLUTION) ×2 IMPLANT

## 2020-07-11 NOTE — Discharge Instructions (Signed)
07/11/2020  Return to work: 4 weeks  Activity: 1. Be up and out of the bed during the day.  Take a nap if needed.  You may walk up steps but be careful and use the hand rail.  Stair climbing will tire you more than you think, you may need to stop part way and rest.   2. No lifting or straining for 4 weeks.  3. No driving for 1 week.  Do Not drive if you are taking narcotic pain medicine.  4. Shower daily.  Use soap and water on your incision and pat dry; don't rub.   5. No sexual activity and nothing in the vagina for 8 weeks.  Medications:  - Take ibuprofen and tylenol first line for pain control. Take these regularly (every 6 hours) to decrease the build up of pain.  - If necessary, for severe pain not relieved by ibuprofen, take oxycodone.  - While taking oxycodone you should take sennakot every night to reduce the likelihood of constipation. If this causes diarrhea, stop its use.  Diet: 1. Low sodium Heart Healthy Diet is recommended.  2. It is safe to use a laxative if you have difficulty moving your bowels.   Wound Care: 1. Keep clean and dry.  Shower daily. 2. You can get the dressing wet in the shower, but avoid tub baths. 3. Remove the dressing between 5 and 7 days after your surgery (1 week after your operation). 4. After the dressing is removed you can get the incision wet in the shower, however continue to avoid tub baths until advised otherwise by your surgeon at follow-up.   Reasons to call the Doctor:   Fever - Oral temperature greater than 100.4 degrees Fahrenheit  Foul-smelling vaginal discharge  Difficulty urinating  Nausea and vomiting  Increased pain at the site of the incision that is unrelieved with pain medicine.  Difficulty breathing with or without chest pain  New calf pain especially if only on one side  Sudden, continuing increased vaginal bleeding with or without clots.   Follow-up: 1. See [name] in 3 weeks.  Contacts: For  questions or concerns you should contact:  Dr. [name] at [phone] After hours and on week-ends call [phone] and ask to speak to the physician on call for Gynecologic Oncology    After Your Surgery  The information in this section will tell you what to expect after your surgery, both during your stay and after you leave. You will learn how to safely recover from your surgery. Write down any questions you have and be sure to ask your doctor or nurse.  What to Expect When you wake up after your surgery, you will be in the [facility] (PACU) or your recovery room. A nurse will be monitoring your body temperature, blood pressure, pulse, and oxygen levels. You may have a urinary catheter in your bladder to help monitor the amount of urine you are making. It should come out before you go home. You will also have compression boots on your lower legs to help your circulation. Your pain medication will be given through an IV line or in tablet form. If you are having pain, tell your nurse. Your nurse will tell you how to recover from your surgery. Below are examples of ways you can help yourself recover safely. . You will be encouraged to walk with the help of your nurse or physical therapist. We will give you medication to relieve pain. Walking  helps reduce the risk for blood clots and pneumonia. It also helps to stimulate your bowels so they begin working again. . Use your incentive spirometer. This will help your lungs expand, which prevents pneumonia.   Commonly Asked Questions  Will I have pain after surgery? Yes, you will have some pain after your surgery, especially in the first few days. Your doctor and nurse will ask you about your pain often. You will be given medication to manage your pain as needed. If your pain is not relieved, please tell your doctor or nurse. It is important to control your pain so you can cough, breathe deeply, use your incentive spirometer,  and get out of bed and walk.  Will I be able to eat? Yes, you will be able to eat a regular diet or eat as tolerated. You should start with foods that are soft and easy to digest such as apple sauce and chicken noodle soup. Eat small meals frequently, and then advance to regular foods. If you experience bloating, gas, or cramps, limit high-fiber foods, including whole grain breads and cereal, nuts, seeds, salads, fresh fruit, broccoli, cabbage, and cauliflower. Will I have pain when I am home? The length of time each person has pain or discomfort varies. You may still have some pain when you go home and will probably be taking pain medication. Follow the guidelines below. . Take your medications as directed and as needed. . Call your doctor if the medication prescribed for you doesn't relieve your pain. . Don't drive or drink alcohol while you're taking prescription pain medication. . As your incision heals, you will have less pain and need less pain medication. A mild pain reliever such as acetaminophen (Tylenol) or ibuprofen (Advil) will relieve aches and discomfort. However, large quantities of acetaminophen may be harmful to your liver. Don't take more acetaminophen than the amount directed on the bottle or as instructed by your doctor or nurse. . Pain medication should help you as you resume your normal activities. Take enough medication to do your exercises comfortably. Pain medication is most effective 30 to 45 minutes after taking it. Marland Kitchen Keep track of when you take your pain medication. Taking it when your pain first begins is more effective than waiting for the pain to get worse. Pain medication may cause constipation (having fewer bowel movements than what is normal for you).  How can I prevent constipation? . Go to the bathroom at the same time every day. Your body will get used to going at that time. . If you feel the urge to go, don't put it off. Try to use the bathroom 5 to 15 minutes  after meals. . After breakfast is a good time to move your bowels. The reflexes in your colon are strongest at this time. . Exercise, if you can. Walking is an excellent form of exercise. . Drink 8 (8-ounce) glasses (2 liters) of liquids daily, if you can. Drink water, juices, soups, ice cream shakes, and other drinks that don't have caffeine. Drinks with caffeine, such as coffee and soda, pull fluid out of the body. . Slowly increase the fiber in your diet to 25 to 35 grams per day. Fruits, vegetables, whole grains, and cereals contain fiber. If you have an ostomy or have had recent bowel surgery, check with your doctor or nurse before making any changes in your diet. . Both over-the-counter and prescription medications are available to treat constipation. Start with 1 of the following over-the-counter medications first:  o Docusate sodium (Colace) 100 mg. Take ___1__ capsules _2____ times a day. This is a stool softener that causes few side effects. Don't take it with mineral oil. o Polyethylene glycol (MiraLAX) 17 grams daily. o Senna (Senokot) 2 tablets at bedtime. This is a stimulant laxative, which can cause cramping. . If you haven't had a bowel movement in 2 days, call your doctor or nurse.  Can I shower? Yes, you should shower 24 hours after your surgery. Be sure to shower every day. Taking a warm shower is relaxing and can help decrease muscle aches. Use soap when you shower and gently wash your incision. Pat the areas dry with a towel after showering, and leave your incision uncovered (unless there is drainage). Call your doctor if you see any redness or drainage from your incision. Don't take tub baths until you discuss it with your doctor at the first appointment after your surgery. How do I care for my incisions? You will have several small incisions on your abdomen. The incisions are closed with Steri-Strips or Dermabond. You may also have square white dressings on your incisions  ([place]). You can remove these in the shower 24 hours after your surgery. You should clean your incisions with soap and water. If you go home with Steri-Strips on your incision, they will loosen and may fall off by themselves. If they haven't fallen off within 10 days, you can remove them. If you go home with Dermabond over your sutures (stitches), it will also loosen and peel off.  What are the most common symptoms after a hysterectomy? It's common for you to have some vaginal spotting or light bleeding. You should monitor this with a pad or a panty liner. If you have having heavy bleeding (bleeding through a pad or liner every 1 to 2 hours), call your doctor right away. It's also common to have some discomfort after surgery from the air that was pumped into your abdomen during surgery. To help with this, walk, drink plenty of liquids and make sure to take the stool softeners you received.  When is it safe for me to drive? You may resume driving 2 weeks after surgery, as long as you are not taking pain medication that may make you drowsy.  When can I resume sexual activity? Do not place anything in your vagina or have vaginal intercourse for 8 weeks after your surgery. Some people will need to wait longer than 8 weeks, so speak with your doctor before resuming sexual intercourse.  Will I be able to travel? Yes, you can travel. If you are traveling by plane within a few weeks after your surgery, make sure you get up and walk every hour. Be sure to stretch your legs, drink plenty of liquids, and keep your feet elevated when possible.  Will I need any supplies? Most people do not need any supplies after the surgery. In the rare case that you do need supplies, such as tubes or drains, your nurse will order them for you.  When can I return to work? The time it takes to return to work depends on the type of work you do, the type of surgery you had, and how fast your body heals. Most people can  return to work about 2 to 4 weeks after the surgery.  What exercises can I do? Exercise will help you gain strength and feel better. Walking and stair climbing are excellent forms of exercise. Gradually increase the distance you walk. Climb stairs slowly,  resting or stopping as needed. Ask your doctor or nurse before starting more strenuous exercises.  When can I lift heavy objects? Most people should not lift anything heavier than 10 pounds (4.5 kilograms) for at least 4 weeks after surgery. Speak with your doctor about when you can do heavy lifting.  How can I cope with my feelings? After surgery for a serious illness, you may have new and upsetting feelings. Many people say they felt weepy, sad, worried, nervous, irritable, and angry at one time or another. You may find that you can't control some of these feelings. If this happens, it's a good idea to seek emotional support. The first step in coping is to talk about how you feel. Family and friends can help. Your nurse, doctor, and social worker can reassure, support, and guide you. It's always a good idea to let these professionals know how you, your family, and your friends are feeling emotionally. Many resources are available to patients and their families. Whether you're in the hospital or at home, the nurses, doctors, and social workers are here to help you and your family and friends handle the emotional aspects of your illness.  When is my first appointment after surgery? Your first appointment after surgery will be 2 to 4 weeks after surgery. Your nurse will give you instructions on how to make this appointment, including the phone number to call.  What if I have other questions? If you have any questions or concerns, please talk with your doctor or nurse. You can reach them Monday through Friday from 9:00 am to 5:00 pm. After 5:00 pm, during the weekend, and on holidays, call [phone] and ask for the doctor on call for your  doctor.  . Have a temperature of 101 F (38.3 C) or higher . Have pain that does not get better with pain medication . Have redness, drainage, or swelling from your incisions

## 2020-07-11 NOTE — Transfer of Care (Signed)
Immediate Anesthesia Transfer of Care Note  Patient: Kelli Horn  Procedure(s) Performed: XI ROBOTIC ASSISTED TOTAL HYSTERECTOMY WITH BILATERAL SALPINGO OOPHORECTOMY (N/A )  Patient Location: PACU  Anesthesia Type:General  Level of Consciousness: awake, alert  and patient cooperative  Airway & Oxygen Therapy: Patient Spontanous Breathing and Patient connected to face mask oxygen  Post-op Assessment: Report given to RN and Post -op Vital signs reviewed and stable  Post vital signs: Reviewed and stable  Last Vitals:  Vitals Value Taken Time  BP 163/123 07/11/20 1215  Temp    Pulse 84 07/11/20 1215  Resp 17 07/11/20 1215  SpO2 100 % 07/11/20 1215  Vitals shown include unvalidated device data.  Last Pain:  Vitals:   07/11/20 0834  TempSrc:   PainSc: 0-No pain         Complications: No complications documented.

## 2020-07-11 NOTE — Op Note (Signed)
OPERATIVE NOTE 07/11/20  Surgeon: Donaciano Eva   Assistants: Dr Lahoma Crocker (an MD assistant was necessary for tissue manipulation, management of robotic instrumentation, retraction and positioning due to the complexity of the case and hospital policies).   Anesthesia: General endotracheal anesthesia  ASA Class: 3   Pre-operative Diagnosis: endometrial cancer grade 1  Post-operative Diagnosis: same,   Operation: Robotic-assisted laparoscopic total hysterectomy with bilateral salpingoophorectomy  Surgeon: Donaciano Eva  Assistant Surgeon: Lahoma Crocker MD  Anesthesia: GET  Urine Output: 100cc blood tinged  Operative Findings:  : 12cm fibroid uterus, normal tubes and ovaries, narrow urethral meatus, no gross extra-uterine disease.  Estimated Blood Loss:  20cc      Total IV Fluids: 800 ml         Specimens: uterus, cervix, bilateral tubes and ovaries         Complications:  None; patient tolerated the procedure well.         Disposition: PACU - hemodynamically stable.  Procedure Details  The patient was seen in the Holding Room. The risks, benefits, complications, treatment options, and expected outcomes were discussed with the patient.  The patient concurred with the proposed plan, giving informed consent.  The site of surgery properly noted/marked. The patient was identified as Kelli Horn and the procedure verified as a Robotic-assisted hysterectomy with bilateral salpingo oophorectomy. A Time Out was held and the above information confirmed.  After induction of anesthesia, the patient was draped and prepped in the usual sterile manner. Pt was placed in supine position after anesthesia and draped and prepped in the usual sterile manner. The abdominal drape was placed after the CholoraPrep had been allowed to dry for 3 minutes.  Her arms were tucked to her side with all appropriate precautions.  The shoulders were stabilized with padded shoulder  blocks applied to the acromium processes.  The patient was placed in the semi-lithotomy position in [place].  The perineum was prepped with Betadine and the vagina with chlorhexadine. The patient was then prepped. Foley catheter was placed with difficulty (the urethral meatus could not be penetrated by either a 16 or 14 french foley, therefore a 14 french kude catheter needed to be used). There was blood tinged urine after placement of this. A sterile speculum was placed in the vagina.  The cervix was grasped with a single-tooth tenaculum.  The cervix was dilated with Kennon Rounds dilators.  The ZUMI uterine manipulator with a medium colpotomizer ring was placed without difficulty.  A pneum occluder balloon was placed over the manipulator.  OG tube placement was confirmed and to suction.   Next, a 5 mm skin incision was made 1 cm below the subcostal margin in the midclavicular line.  The 5 mm Optiview port and scope was used for direct entry.  Opening pressure was under 10 mm CO2.  The abdomen was insufflated and the findings were noted as above.   At this point and all points during the procedure, the patient's intra-abdominal pressure did not exceed 15 mmHg. Next, a 10 mm skin incision was made in the umbilicus and a right and left port was placed about 10 cm lateral to the robot port on the right and left side.  A fourth arm was placed in the left lower quadrant 2 cm above and superior and medial to the anterior superior iliac spine.  All ports were placed under direct visualization.  The patient was placed in steep Trendelenburg.  Bowel was folded away into the  upper abdomen.  The robot was docked in the normal manner.  The hysterectomy was started after the round ligament on the right side was incised and the retroperitoneum was entered and the pararectal space was developed.  The ureter was noted to be on the medial leaf of the broad ligament.  The peritoneum above the ureter was incised and stretched and  the infundibulopelvic ligament was skeletonized, cauterized and cut.  The posterior peritoneum was taken down to the level of the KOH ring.  The anterior peritoneum was also taken down.  The bladder flap was created to the level of the KOH ring.  The uterine artery on the right side was skeletonized, cauterized and cut in the normal manner.  A similar procedure was performed on the left.  The colpotomy was made and the uterus, cervix, bilateral ovaries and tubes were amputated and delivered through the vagina.  Pedicles were inspected and excellent hemostasis was achieved.    The colpotomy at the vaginal cuff was closed with Vicryl on a CT1 needle in a running manner. An additional layer of interrupted 0-vicryl figure of 8 sutures were used to reinforce the cuff. Irrigation was used and excellent hemostasis was achieved.  At this point in the procedure was completed.  Robotic instruments were removed under direct visulaization.  The robot was undocked. The 10 mm ports were closed with Vicryl on a UR-5 needle and the fascia was closed with 0 Vicryl on a UR-5 needle.  The skin was closed with 4-0 Vicryl in a subcuticular manner.  Dermabond was applied.  Sponge, lap and needle counts correct x 2.  The patient was taken to the recovery room in stable condition.  The vagina was swabbed with  minimal bleeding noted from the left distal vaginal mucosa overlying the urethra (meatus not involved). This was made hemostatic with a running 3-0 vicryl suture.   All instrument and needle counts were correct x  3.   The patient was transferred to the recovery room in a stable condition.  Donaciano Eva, MD

## 2020-07-11 NOTE — Interval H&P Note (Signed)
History and Physical Interval Note:  07/11/2020 9:34 AM  Kelli Horn  has presented today for surgery, with the diagnosis of ENDOMETRIAL CANCER.  The various methods of treatment have been discussed with the patient and family. After consideration of risks, benefits and other options for treatment, the patient has consented to  Procedure(s): XI ROBOTIC ASSISTED TOTAL HYSTERECTOMY WITH BILATERAL SALPINGO OOPHORECTOMY (N/A) as a surgical intervention.  The patient's history has been reviewed, patient examined, no change in status, stable for surgery.  I have reviewed the patient's chart and labs.  Questions were answered to the patient's satisfaction.     Thereasa Solo

## 2020-07-11 NOTE — Anesthesia Procedure Notes (Signed)
Procedure Name: Intubation Date/Time: 07/11/2020 10:23 AM Performed by: Eben Burow, CRNA Pre-anesthesia Checklist: Patient identified, Emergency Drugs available, Suction available, Patient being monitored and Timeout performed Patient Re-evaluated:Patient Re-evaluated prior to induction Oxygen Delivery Method: Circle system utilized Preoxygenation: Pre-oxygenation with 100% oxygen Induction Type: IV induction Ventilation: Mask ventilation without difficulty Laryngoscope Size: Mac and 4 Grade View: Grade I Tube type: Oral Tube size: 7.0 mm Number of attempts: 1 Airway Equipment and Method: Stylet Placement Confirmation: ETT inserted through vocal cords under direct vision,  positive ETCO2 and breath sounds checked- equal and bilateral Secured at: 22 cm Tube secured with: Tape Dental Injury: Teeth and Oropharynx as per pre-operative assessment  Comments: Teeth as preop

## 2020-07-11 NOTE — Anesthesia Preprocedure Evaluation (Addendum)
Anesthesia Evaluation  Patient identified by MRN, date of birth, ID band Patient awake    Reviewed: Allergy & Precautions, NPO status , Patient's Chart, lab work & pertinent test results  History of Anesthesia Complications Negative for: history of anesthetic complications  Airway Mallampati: II  TM Distance: >3 FB Neck ROM: Full    Dental  (+) Loose, Poor Dentition, Dental Advisory Given,    Pulmonary asthma , COPD, former smoker,    Pulmonary exam normal  (-) decreased breath sounds      Cardiovascular hypertension, Pt. on medications and Pt. on home beta blockers + Peripheral Vascular Disease  Normal cardiovascular exam  Cath: 1. Nonobstructive CAD 2. Normal LV function 3. Mildly elevated LVEDP 4. Hypertensive 5. No significant right innominate or subclavian pressure gradient.   Plan: will optimize BP control. Will arrange PFTs, Echo, Carotid and LE arterial dopplers. She is intolerant of beta blockers. Will initiate amlodipine in addition to enalapril for BP control.    Echo study demonstrates:  Echo looks good. Normal LV and RV function. No significant valvular abnormality.    Neuro/Psych    GI/Hepatic GERD  ,  Endo/Other  diabetesMorbid obesity  Renal/GU      Musculoskeletal   Abdominal (+) - obese,   Peds  Hematology   Anesthesia Other Findings   Reproductive/Obstetrics                            Anesthesia Physical  Anesthesia Plan  ASA: III  Anesthesia Plan: General   Post-op Pain Management:    Induction: Intravenous  PONV Risk Score and Plan: 4 or greater and Ondansetron, Dexamethasone, Midazolam and Scopolamine patch - Pre-op  Airway Management Planned: Oral ETT  Additional Equipment: None  Intra-op Plan:   Post-operative Plan: Extubation in OR  Informed Consent: I have reviewed the patients History and Physical, chart, labs and discussed the procedure  including the risks, benefits and alternatives for the proposed anesthesia with the patient or authorized representative who has indicated his/her understanding and acceptance.     Dental advisory given  Plan Discussed with: Anesthesiologist and CRNA  Anesthesia Plan Comments:        Anesthesia Quick Evaluation

## 2020-07-11 NOTE — Anesthesia Postprocedure Evaluation (Signed)
Anesthesia Post Note  Patient: Kelli Horn  Procedure(s) Performed: XI ROBOTIC ASSISTED TOTAL HYSTERECTOMY WITH BILATERAL SALPINGO OOPHORECTOMY (N/A )     Patient location during evaluation: PACU Anesthesia Type: General Level of consciousness: sedated Pain management: pain level controlled Vital Signs Assessment: post-procedure vital signs reviewed and stable Respiratory status: spontaneous breathing and respiratory function stable Cardiovascular status: stable Postop Assessment: no apparent nausea or vomiting Anesthetic complications: no   No complications documented.  Last Vitals:  Vitals:   07/11/20 1500 07/11/20 1515  BP: (!) 178/84 (!) 174/95  Pulse: 86 85  Resp: 16 16  Temp:  36.8 C  SpO2: 95% 94%    Last Pain:  Vitals:   07/11/20 1500  TempSrc:   PainSc: 3                  Jourdin Connors DANIEL

## 2020-07-12 ENCOUNTER — Encounter (HOSPITAL_COMMUNITY): Payer: Self-pay | Admitting: Gynecologic Oncology

## 2020-07-12 ENCOUNTER — Other Ambulatory Visit: Payer: Self-pay | Admitting: Gynecologic Oncology

## 2020-07-12 ENCOUNTER — Telehealth: Payer: Self-pay

## 2020-07-12 DIAGNOSIS — C541 Malignant neoplasm of endometrium: Secondary | ICD-10-CM

## 2020-07-12 MED ORDER — OXYCODONE HCL 5 MG PO TABS: 5.0000 mg | ORAL_TABLET | ORAL | 0 refills | Status: DC | PRN

## 2020-07-12 MED ORDER — IBUPROFEN 800 MG PO TABS: 800.0000 mg | ORAL_TABLET | Freq: Three times a day (TID) | ORAL | 0 refills | Status: DC | PRN

## 2020-07-12 NOTE — Progress Notes (Signed)
Post-op meds prescribed. Patient is using Miralax daily.

## 2020-07-12 NOTE — Telephone Encounter (Addendum)
Kelli Horn states that she is eating,drinking, and urinating well. Passing gas. She is taking Miralax 1 capful 1-2 times as day. Afebrile. Incisions D&I Her face and chest is red last night and today.  No difficulty breathing. No rash or itchiness.  She did receive decadron IV during surgery as well and as being in steep trendelenburg. She is to complete the amoxicillin as ordered for her dental work per Joylene John, NP Pt aware of post op appointments as well as the office number [phone] and after hours number [phone] to call if she has any questions or concerns

## 2020-07-15 ENCOUNTER — Encounter: Payer: Self-pay | Admitting: *Deleted

## 2020-07-16 ENCOUNTER — Encounter (HOSPITAL_COMMUNITY): Payer: 59

## 2020-07-17 ENCOUNTER — Telehealth: Payer: Self-pay | Admitting: Oncology

## 2020-07-17 ENCOUNTER — Encounter: Payer: Self-pay | Admitting: Radiation Oncology

## 2020-07-17 LAB — SURGICAL PATHOLOGY

## 2020-07-17 NOTE — Telephone Encounter (Signed)
Kelli Horn left a message saying that she has decided not do radiation for her breast cancer.  She said there is an acceptable risk of recurrence if she doesn't have radiation.

## 2020-07-17 NOTE — Telephone Encounter (Signed)
I will discuss antiestrogen therapy with her, she is scheduled to see me on 12/6.   Truitt Merle MD

## 2020-07-17 NOTE — Progress Notes (Signed)
I spoke with the patient today by phone as I received word from Elmo Putt RN that she decided not to undergo radiation therapy.  When I spoke with the patient it was clear that she had made up her mind.  She has decided that she does not want to undergo any more elective procedures and she would rather take on an increased risk of recurrence in her breast than the potential side effects of radiation treatment.  Therefore, we will cancel her radiation treatments.  She will continue to recuperate from her recent hysterectomy and I let her know that I will contact medical oncology so they are aware that she is still open to considering antiestrogen therapy.  -----------------------------------  Eppie Gibson, MD

## 2020-07-18 ENCOUNTER — Ambulatory Visit: Payer: 59 | Admitting: Radiation Oncology

## 2020-07-18 ENCOUNTER — Encounter: Payer: Self-pay | Admitting: *Deleted

## 2020-07-18 ENCOUNTER — Inpatient Hospital Stay: Payer: 59 | Admitting: Gynecologic Oncology

## 2020-07-19 ENCOUNTER — Encounter: Payer: Self-pay | Admitting: Gynecologic Oncology

## 2020-07-19 ENCOUNTER — Ambulatory Visit: Payer: 59

## 2020-07-19 ENCOUNTER — Inpatient Hospital Stay: Payer: 59 | Attending: Hematology | Admitting: Gynecologic Oncology

## 2020-07-19 DIAGNOSIS — Z7189 Other specified counseling: Secondary | ICD-10-CM

## 2020-07-19 DIAGNOSIS — C541 Malignant neoplasm of endometrium: Secondary | ICD-10-CM

## 2020-07-19 DIAGNOSIS — Z90710 Acquired absence of both cervix and uterus: Secondary | ICD-10-CM

## 2020-07-19 DIAGNOSIS — Z90722 Acquired absence of ovaries, bilateral: Secondary | ICD-10-CM

## 2020-07-19 NOTE — Progress Notes (Signed)
Gynecologic Oncology Telehealth Follow-up Note  I connected with Kelli Horn on 07/19/20 at  5:00 PM EST by telephone and verified that I am speaking with the correct person using two identifiers.  I discussed the limitations, risks, security and privacy concerns of performing an evaluation and management service by telemedicine and the availability of in-person appointments. I also discussed with the patient that there may be a patient responsible charge related to this service. The patient expressed understanding and agreed to proceed.  Other persons participating in the visit and their role in the encounter: none.  Patient's location: home Provider's location: [facility]  Chief Complaint:  Chief Complaint  Patient presents with  . endometrial cancer    Assessment/Plan:  [name]  is a 64 y.o.  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  Pathology revealed low risk factors for recurrence, therefore no adjuvant therapy is recommended according to NCCN guidelines.  I discussed risk for recurrence and typical symptoms encouraged her to notify us of these should they develop between visits.  I recommend she have follow-up every 6 months for 5 years in accordance with NCCN guidelines. Those visits should include symptom assessment, physical exam and pelvic examination. Pap smears are not indicated or recommended in the routine surveillance of endometrial cancer.  HPI: [name] is a 64 year old P2 who was seen in consultation at the request of Dr Paula Compton for evaluation and treatment of grade 1 endometrial cancer in the setting of morbid obesity with a BMI of 43 kg meters squared, recent diagnosis of left sided breast cancer, stage I, ER/PR positive, peripheral vascular disease, coronary artery disease, possible enteric ischemia, COPD, suspected sleep apnea, diabetes mellitus,  and several other medical comorbidities.   Her symptoms began in July 2020 when she experienced an episode of vaginal spotting.  She again experienced vaginal spotting in February 2021.  She did not have a gynecologist.  She saw her primary care provider for a routine checkup in the summer 2021 and reported the symptom of vaginal spotting.  At that time her PCP recommend referral to a gynecologist for further work-up.  Work-up of symptoms included a transvaginal ultrasound, office endometrial biopsy, and CT abdomen and pelvis. Transvaginal US on May 17, 2020 showed a uterus measuring 5.7 x 6.9 x 5.9 cm with an endometrial thickness of 18 mm.  The uterus contained a 4.2 x 3.3 cm fibroid.  The uterus was retroflexed. Endometrial sampling with office [place] biopsy was performed on May 29, 2020 and showed FIGO grade 1 endometrioid adenocarcinoma.   The patient reported a complex past medical history.  She was morbidly obese with a BMI of 43 kg meters squared.  She has severe peripheral, coronary, and mesenteric vascular disease with symptoms of intermittent claudication and mesenteric ischemia.  She has findings on CT scan that are suggestive of a previous renal infarction.  She had a history of angina-like symptoms and has reported coronary calcifications.  CT imaging from August 2021 showed severe mixed calcific atherosclerosis.  She has poor sleep which is suspected to be from sleep apnea and was awaiting evaluation with sleep studies for either home O2 at night or CPAP.  She had COPD for which she was using multiple inhaled medications but no oral steroids.  She was recently diagnosed with ER/PR positive stage I breast cancer treated with lumpectomy and sentinel lymph node biopsy.  At the time of her endometrial cancer diagnosis she was awaiting definitive adjuvant radiation therapy to the left chest wall.  She would need to be on antiestrogen therapy for prolonged period of time.  With  respect to her vascular disease, Dr. [name] had planned for percutaneous peripheral stenting of the iliacs.  Her mesenteric ischemic symptoms were being worked up with a planned colonoscopy and endoscopy.  Potentially she would have attempts at stenting of her SMA.  The interventional cardiologist were hoping to delay proceeding with endovascular procedures until after she had completed her surgical management for her cancers because she would need to be on prolonged anticoagulant therapy after her stenting procedures.  She was taking 81 mg aspirin.  With respect to her performance status she is unable to walk up a flight of stairs due to claudication pain and shortness of breath.  She denied chest pain but really stresses her self enough to exert what had previously resulted in angina type symptoms.  She had a left heart catheterization and coronary angiography with Dr. Martinique on 04/23/2020 which showed 25% stenosis of the mid RCA, 25% stenosis of the mid LAD, normal left ventricular systolic function, mild elevation of left ventricular end-diastolic pressure.  65% estimated left ventricular ejection fraction.  No interventions were necessary at that procedure.  Carotid duplex on 05/16/2020 showed no significant carotid stenosis but absent right vertebral flow and bilateral subclavian stenosis.  Interval Hx:  On 07/11/20 she underwent robotic assisted total hysterectomy for uterus greater than 250 g, BSO. Intraoperative findings were significant for a 12 cm fibroid uterus, normal tubes and ovaries, narrow urethral meatus, no gross extrauterine disease. Surgery was challenging due to body habitus but uncomplicated.  Final pathology revealed a grade 1 endometrioid endometrial adenocarcinoma within an endometrial polyp with less than 1 mm of myometrial invasion (total thickness 30 mm), no lymphovascular space invasion, negative adnexa and cervix.  Lymph nodes not sampled.  MMR intact.  She was determined to  have low risk disease for recurrence and no adjuvant therapy was recommended.  Since surgery she had done well with no specific complications.   Current Meds:  Outpatient Encounter Medications as of 07/19/2020  Medication Sig  . albuterol (PROVENTIL HFA;VENTOLIN HFA) 108 (90 Base) MCG/ACT inhaler Inhale 2 puffs into the lungs every 6 (six) hours as needed for wheezing or shortness of breath.   Marland Kitchen albuterol (PROVENTIL) (2.5 MG/3ML) 0.083% nebulizer solution Take 2.5 mg by nebulization every 6 (six) hours as needed for shortness of breath.  Marland Kitchen amLODipine (NORVASC) 2.5 MG tablet Take 1 tablet (2.5 mg total) by mouth daily.  Marland Kitchen amoxicillin (AMOXIL) 500 MG capsule Take 500 mg by mouth 3 (three) times daily.  . enalapril (VASOTEC) 10 MG tablet Take 15 mg by mouth 2 (two) times daily.   . Fluticasone-Umeclidin-Vilant (TRELEGY ELLIPTA) 100-62.5-25 MCG/INH AEPB Inhale 1 puff into the lungs daily.  . hydrOXYzine (VISTARIL) 25 MG capsule Take 25 mg by mouth 2 (two) times daily as needed for itching.   Marland Kitchen ibuprofen (ADVIL) 800 MG tablet Take 1 tablet (800 mg total) by mouth every 8 (eight) hours as needed for moderate pain.  . methocarbamol (ROBAXIN) 750 MG tablet Take 750 mg by mouth 2 (two) times daily as needed for muscle spasms.   Marland Kitchen oxyCODONE (OXY IR/ROXICODONE) 5 MG immediate release tablet Take 1 tablet (5 mg total) by mouth every 4 (four) hours as needed for severe pain. For AFTER surgery, do not take and drive  .  pantoprazole (PROTONIX) 40 MG tablet Take 40 mg by mouth daily.  Vladimir Faster Glycol-Propyl Glycol (SYSTANE ULTRA) 0.4-0.3 % SOLN Place 1 drop into both eyes 3 (three) times daily as needed (dry eyes).   . rosuvastatin (CRESTOR) 5 MG tablet Take 5 mg twice a week (Patient taking differently: Take 5 mg by mouth once a week. )  . vitamin B-12 (CYANOCOBALAMIN) 1000 MCG tablet Take 1,000 mcg by mouth daily.   No facility-administered encounter medications on file as of 07/19/2020.    Allergy:   Allergies  Allergen Reactions  . Carvedilol Shortness Of Breath    Reacts with albuterol, worsens asthma symptoms when taken together   . Peanut-Containing Drug Products Anaphylaxis    Asthma attack  . 5-Alpha Reductase Inhibitors     Social Hx:   Social History   Socioeconomic History  . Marital status: Married    Spouse name: Not on file  . Number of children: 2  . Years of education: Not on file  . Highest education level: Not on file  Occupational History  . Occupation: retired   Tobacco Use  . Smoking status: Former Smoker    Packs/day: 2.00    Years: 10.00    Pack years: 20.00    Types: Cigarettes    Quit date: 04/16/2004    Years since quitting: 16.2  . Smokeless tobacco: Never Used  Vaping Use  . Vaping Use: Never used  Substance and Sexual Activity  . Alcohol use: No  . Drug use: No  . Sexual activity: Not Currently  Other Topics Concern  . Not on file  Social History Narrative  . Not on file   Social Determinants of Health   Financial Resource Strain:   . Difficulty of Paying Living Expenses: Not on file  Food Insecurity:   . Worried About Charity fundraiser in the Last Year: Not on file  . Ran Out of Food in the Last Year: Not on file  Transportation Needs:   . Lack of Transportation (Medical): Not on file  . Lack of Transportation (Non-Medical): Not on file  Physical Activity:   . Days of Exercise per Week: Not on file  . Minutes of Exercise per Session: Not on file  Stress:   . Feeling of Stress : Not on file  Social Connections:   . Frequency of Communication with Friends and Family: Not on file  . Frequency of Social Gatherings with Friends and Family: Not on file  . Attends Religious Services: Not on file  . Active Member of Clubs or Organizations: Not on file  . Attends Archivist Meetings: Not on file  . Marital Status: Not on file  Intimate Partner Violence:   . Fear of Current or Ex-Partner: Not on file  . Emotionally  Abused: Not on file  . Physically Abused: Not on file  . Sexually Abused: Not on file    Past Surgical Hx:  Past Surgical History:  Procedure Laterality Date  . APPENDECTOMY    . BIOPSY  06/27/2020   Procedure: BIOPSY;  Surgeon: [name], MD;  Location: [facility];  Service: Endoscopy;;  . BREAST LUMPECTOMY WITH RADIOACTIVE SEED AND SENTINEL LYMPH NODE BIOPSY Bilateral 06/07/2020   Procedure: BILATERAL BREAST LUMPECTOMY WITH RADIOACTIVE SEED AND LEFT SENTINEL LYMPH NODE BIOPSY;  Surgeon: [name], MD;  Location: [facility];  Service: General;  Laterality: Bilateral;  PEC BLOCK  . COLONOSCOPY WITH PROPOFOL N/A 06/27/2020   Procedure: COLONOSCOPY  WITH PROPOFOL;  Surgeon: [name], MD;  Location: [facility];  Service: Endoscopy;  Laterality: N/A;  . LEFT HEART CATH AND CORONARY ANGIOGRAPHY N/A 04/23/2020   Procedure: LEFT HEART CATH AND CORONARY ANGIOGRAPHY;  Surgeon: Martinique, Peter M, MD;  Location: [facility];  Service: Cardiovascular;  Laterality: N/A;  . POLYPECTOMY  06/27/2020   Procedure: POLYPECTOMY;  Surgeon: [name], MD;  Location: [facility];  Service: Endoscopy;;  . ROBOTIC ASSISTED TOTAL HYSTERECTOMY WITH BILATERAL SALPINGO OOPHERECTOMY N/A 07/11/2020   Procedure: XI ROBOTIC ASSISTED TOTAL HYSTERECTOMY WITH BILATERAL SALPINGO OOPHORECTOMY;  Surgeon: [name], MD;  Location: [facility];  Service: Gynecology;  Laterality: N/A;    Past Medical Hx:  Past Medical History:  Diagnosis Date  . Acid reflux   . Anxiety   . Asthma   . Breast cancer ([place]) 05/2020   left breast DCIS  . Chronic kidney disease    Right side question infarction  . COPD (chronic obstructive pulmonary disease) ([place])   . Diabetes mellitus without complication ([facility])    no meds  . Hyperlipidemia   . Hypertension   . PAD (peripheral artery disease) ([place])   . Peripheral vascular disease ([place])    severe athrosclerosis  . Sleep apnea     no cpap at  this time. Sleep study done Jun 20 2020    Past Gynecological History:  SVD x 2. Patient's last menstrual period was 12/09/2013.  Family Hx:  Family History  Problem Relation Age of Onset  . Heart attack Father 27  . Aneurysm Father   . Breast cancer Maternal Aunt   . Lung cancer Mother   . Diabetes Other   . Heart disease Paternal Uncle   . Aneurysm Paternal Uncle     Review of Systems:  Constitutional  Feels well,    ENT Normal appearing ears and nares bilaterally Skin/Breast  No rash, sores, jaundice, itching, dryness Cardiovascular  + SOB, LE pain with ambulation, nocturnal leg pain  Pulmonary  + cough or wheeze.  Gastro Intestinal  + intermittent melena and diarrhea Genito Urinary  No frequency, urgency, dysuria, + postmenopausal bleeding Musculo Skeletal  No myalgia, arthralgia, joint swelling or pain  Neurologic  No weakness, numbness, change in gait,  Psychology  No depression, anxiety, insomnia.   Vitals:  Last menstrual period 12/09/2013.  Physical Exam: Deferred  I discussed the assessment and treatment plan with the patient. The patient was provided with an opportunity to ask questions and all were answered. The patient agreed with the plan and demonstrated an understanding of the instructions.   The patient was advised to call back or see an in-person evaluation if the symptoms worsen or if the condition fails to improve as anticipated.   I provided 30 minutes of non face-to-face telephone visit time during this encounter, and > 50% was spent counseling as documented under my assessment & plan.  Thereasa Solo, MD  07/19/2020, 5:48 PM

## 2020-07-21 ENCOUNTER — Ambulatory Visit: Payer: 59

## 2020-07-22 ENCOUNTER — Ambulatory Visit: Payer: 59

## 2020-07-23 ENCOUNTER — Encounter (HOSPITAL_COMMUNITY): Payer: Self-pay | Admitting: Gynecologic Oncology

## 2020-07-23 ENCOUNTER — Ambulatory Visit: Payer: 59

## 2020-07-23 DIAGNOSIS — C541 Malignant neoplasm of endometrium: Secondary | ICD-10-CM

## 2020-07-24 ENCOUNTER — Ambulatory Visit: Payer: 59

## 2020-07-26 NOTE — Progress Notes (Signed)
Cardiology Office Note   Date:  08/01/2020   ID:  [facility], Riches [birth year], MRN [id number]  PCP:  [name], MD  Cardiologist:   Dawanna Grauberger Martinique, MD   Chief Complaint  Patient presents with   PAD   Hypertension      History of Present Illness: Kelli Horn is a 64 y.o. female who is seen for follow up  SOB. She has a history of DM, HLD,  and HTN. She was seen in the past by Dr Debara Pickett in 2017. Myoview at that time was normal. Echo showed severe LVH with normal systolic function and normal valves. Ecg was normal.   She reports that she has been SOB for years. This has progressed recently and is now severe. CXR was normal. No improvement with albuterol- this just makes her feel jittery. She describes a severe burning discomfort in her right breast. She also has severe burning pain in her left anterior chest associated with nausea, SOB and sweating. This occurs with any activity including walking across the room. Initially this would improve with rest but now it may take over an hour to go away. Hasn't really tried sl Ntg. Feels very shaky. BP has been running high. She has been reluctant to take Crestor because she states it causes severe lower abdominal cramping and diarrhea.   Other studies of note include plain X rays dating back to 2017 that showed significant aortic atherosclerosis. She recently had Abdominal/pelvic CT on 04/05/20 showing severe bilateral common iliac stenosis. 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 There was medical renal disease. She has an [name] report apparently showing a 2.9 cm gallstone but no gallstone noted on CT.  We evaluated her with cardiac cath showing mild nonobstructive CAD. No significant right subclavian/innominate gradient. Mildly elevated LVEDP. Echo showed mild LVH with normal systolic function. Arterial doppler studies show severe bilateral iliac disease with moderate bilateral reduced  ABIs and reduced toe pressures. No significant carotid disease but bilateral subclavian disease noted. She did have PFTs showing severe obstructive disease. Pulmonary evaluation was done who felt she had predominantly asthma. Was started on Trelegy and she notes significant improvement in her breathing. She did have a home sleep study in October showing mild OSA. Being managed by Dr [name]. She also had colonoscopy in October showing multiple polyps that were removed.   She has also was diagnosed with left breast carcinoma. She underwent bilateral breast lumpectomies on October 8. She then underwent total hysterectomy with BSO for endometrial CA on 07/11/20. She decided against RT for her breast CA. To discuss hormonal therapy with Dr Burr Medico. She has seen Dr [name]  for her PAD and angiogram planned once she has recovered from surgery.   On follow up she is feeling well. States her breathing is better. No chest pain. Still having cramps in her legs. Thinks its related to Crestor but is more likely related to ischemia. She has bad teeth and states she needs extractions but can't get in to see dentist until February. Reports BP at home 440-102 systolic with diastolic 75.       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 Sleep study done Jun 20 2020    Past Surgical History:  Procedure Laterality Date   APPENDECTOMY     BIOPSY  06/27/2020   Procedure: BIOPSY;  Surgeon: [name], MD;  Location: [facility];  Service: Endoscopy;;   BREAST  LUMPECTOMY WITH RADIOACTIVE SEED AND SENTINEL LYMPH NODE BIOPSY Bilateral 06/07/2020   Procedure: BILATERAL BREAST LUMPECTOMY WITH RADIOACTIVE SEED AND LEFT SENTINEL LYMPH NODE BIOPSY;  Surgeon: [name], MD;  Location: [facility];  Service: General;  Laterality: Bilateral;  PEC BLOCK   COLONOSCOPY WITH PROPOFOL N/A 06/27/2020   Procedure: COLONOSCOPY WITH PROPOFOL;  Surgeon: [name], MD;  Location: [facility];  Service: Endoscopy;  Laterality: N/A;   LEFT HEART CATH AND CORONARY ANGIOGRAPHY N/A 04/23/2020   Procedure: LEFT HEART CATH AND CORONARY ANGIOGRAPHY;  Surgeon: Martinique, Aliena Ghrist M, MD;  Location: [facility];  Service: Cardiovascular;  Laterality: N/A;   POLYPECTOMY  06/27/2020   Procedure: POLYPECTOMY;  Surgeon: [name], MD;  Location: [facility];  Service: Endoscopy;;   ROBOTIC ASSISTED TOTAL HYSTERECTOMY WITH BILATERAL SALPINGO OOPHERECTOMY N/A 07/11/2020   Procedure: XI ROBOTIC ASSISTED TOTAL HYSTERECTOMY WITH BILATERAL SALPINGO OOPHORECTOMY;  Surgeon: [name], MD;  Location: [facility];  Service: Gynecology;  Laterality: N/A;     Current Outpatient Medications  Medication Sig Dispense Refill   albuterol (PROVENTIL HFA;VENTOLIN HFA) 108 (90 Base) MCG/ACT inhaler Inhale 2 puffs into the lungs every 6 (six) hours as needed for wheezing or shortness of breath.      albuterol (PROVENTIL) (2.5 MG/3ML) 0.083% nebulizer solution Take 2.5 mg by nebulization every 6 (six) hours as needed for shortness of breath.     amLODipine (NORVASC) 2.5 MG tablet Take 1 tablet (2.5 mg total) by mouth daily. 180 tablet 3   enalapril (VASOTEC) 10 MG tablet Take 15 mg by mouth 2 (two) times daily.      Fluticasone-Umeclidin-Vilant (TRELEGY ELLIPTA) 100-62.5-25 MCG/INH AEPB Inhale 1 puff into the lungs daily. 1 each 6   methocarbamol (ROBAXIN) 750 MG tablet Take 750 mg by mouth 2 (two) times daily as needed for muscle spasms.      Polyethyl  Glycol-Propyl Glycol (SYSTANE ULTRA) 0.4-0.3 % SOLN Place 1 drop into both eyes 3 (three) times daily as needed (dry eyes).      rosuvastatin (CRESTOR) 5 MG tablet Take 5 mg twice a week (Patient taking differently: Take 5 mg by mouth once a week. ) 30 tablet 6   No current facility-administered medications for this visit.    Allergies:   Carvedilol, Peanut-containing drug products, and 5-alpha reductase inhibitors    Social History:  The patient  reports that she quit smoking about 16 years ago. Her smoking use included cigarettes. She has a 20.00 pack-year smoking history. She has never used smokeless tobacco. She reports that she does not drink alcohol and does not use drugs.   Family History:  The patient's family history includes Aneurysm in her father and paternal uncle; Breast cancer in her maternal aunt; Diabetes in an other family member; Heart attack (age of onset: 71) in her father; Heart disease in her paternal uncle; Lung cancer in her mother.    ROS:  Please see the history  of present illness.   Otherwise, review of systems are negative.   All other systems are reviewed and negative.    PHYSICAL EXAM: VS:  BP (!) 164/84    Pulse 85    Ht 5\' 10"  (1.778 m)    Wt (!) 306 lb 12.8 oz (139.2 kg)    LMP 12/09/2013    SpO2 93%    BMI 44.02 kg/m  , BMI Body mass index is 44.02 kg/m. GEN: Well nourished, morbidly obese, in no acute distress  HEENT: normal  Neck: no JVD,  Left carotid and bilateral subclavian bruits.  Cardiac: RRR; no murmurs, rubs, or gallops,no edema. Femoral and pedal pulses are not palpable.  Respiratory:  clear to auscultation bilaterally, normal work of breathing GI: morbidly obese with large panus. Soft, nontender, nondistended, + BS. I don't hear renal bruits MS: no deformity or atrophy  Skin: warm and dry, no rash Neuro:  Strength and sensation are intact Psych: euthymic mood, full affect   EKG:  EKG is not ordered today.   Recent Labs: 07/05/2020:  ALT 17; BUN 16; Creatinine, Ser 0.80; Hemoglobin 13.3; Platelets 389; Potassium 4.1; Sodium 140    Lipid Panel    Component Value Date/Time   CHOL 196 04/19/2020 1144   TRIG 154 (H) 04/19/2020 1144   HDL 35 (L) 04/19/2020 1144   CHOLHDL 5.6 (H) 04/19/2020 1144   CHOLHDL 6.1 12/11/2015 0246   VLDL 20 12/11/2015 0246   LDLCALC 133 (H) 04/19/2020 1144    Additional labs from Dr Arelia Sneddon: dated 02/07/20: D dimer normal. BNP 54. Glucose 118. UA 7.4. otherwise CMET normal. CBC normal Hgb 14.7. TFTS normal. Cholesterol 207, triglycerides 137, HDL 40, LDL 142.  Urinalysis normal.  Wt Readings from Last 3 Encounters:  08/01/20 (!) 306 lb 12.8 oz (139.2 kg)  07/11/20 (!) 303 lb (137.4 kg)  07/05/20 (!) 303 lb (137.4 kg)      Other studies Reviewed: Additional studies/ records that were reviewed today include:   Myoview 12/26/15: Study Highlights   Nuclear stress EF: 62%.  There was no ST segment deviation noted during stress.  No T wave inversion was noted during stress.  The study is normal.  This is a low risk study.  The left ventricular ejection fraction is normal (55-65%).   Echo 12/11/15: Study Conclusions   - Left ventricle: The cavity size was normal. Wall thickness was  increased in a pattern of severe LVH. Systolic function was  normal. The estimated ejection fraction was in the range of 60%  to 65%. Doppler parameters are consistent with abnormal left  ventricular relaxation (grade 1 diastolic dysfunction). The E/e&'  ratio is between 8-15, suggesting indeterminate LV filling  pressure.  - Aortic valve: Trileaflet. Sclerosis without stenosis. There was  no regurgitation.  - Mitral valve: Calcified annulus. There was trivial regurgitation.  - Left atrium: The atrium was mildly dilated.  - Tricuspid valve: There was trivial regurgitation.  - Pulmonary arteries: PA peak pressure: 32 mm Hg (S).  - Inferior vena cava: The vessel was dilated. The  respirophasic  diameter changes were blunted (< 50%), consistent with elevated  central venous pressure.   Cardiac cath 04/23/20:  LEFT HEART CATH AND CORONARY ANGIOGRAPHY  Conclusion    Mid RCA lesion is 25% stenosed.  Mid LAD lesion is 25% stenosed.  The left ventricular systolic function is normal.  LV end diastolic pressure is mildly elevated.  The left ventricular ejection fraction is greater than 65% by visual  estimate.   1. Nonobstructive CAD 2. Normal LV function 3. Mildly elevated LVEDP 4. Hypertensive 5. No significant right innominate or subclavian pressure gradient.   Plan: will optimize BP control. Will arrange PFTs, Echo, Carotid and LE arterial dopplers. She is intolerant of beta blockers. Will initiate amlodipine in addition to enalapril for BP control.   Echo 04/30/20: IMPRESSIONS    1. Left ventricular ejection fraction, by estimation, is 55 to 60%. The  left ventricle has normal function. The left ventricle has no regional  wall motion abnormalities. There is mild concentric left ventricular  hypertrophy. Left ventricular diastolic  parameters are indeterminate. Elevated left ventricular end-diastolic  pressure.  2. Right ventricular systolic function is normal. The right ventricular  size is normal. Tricuspid regurgitation signal is inadequate for assessing  PA pressure.  3. Left atrial size was mildly dilated.  4. Right atrial size was mildly dilated.  5. The mitral valve is normal in structure. Trivial mitral valve  regurgitation. No evidence of mitral stenosis. Moderate mitral annular  calcification.  6. The aortic valve is tricuspid. There is mild calcification of the  aortic valve. There is mild thickening of the aortic valve. Aortic valve  regurgitation is not visualized. Mild aortic valve sclerosis is present,  with no evidence of aortic valve  stenosis.  7. The inferior vena cava is normal in size with greater than 50%   respiratory variability, suggesting right atrial pressure of 3 mmHg.   Conclusion(s)/Recommendation(s): Otherwise normal echocardiogram, with  minor abnormalities described in the report  LE arterial dopplers:  Aorta-Iliacs: Technically challenging and limited evaluation.   Monophasic waveforms in the bilateral external iliac arteries suggest  severe stenosis and/or occlusion of the bilateral common iliac arteries.  Unable to directly visualize the common iliac arteries due to patient body  habitus and overlying bowel gas.   Right: Near normal infrainguinal examination. No evidence of focal  stenosis.   Left: Near normal infrainguinal examination. No evidence of focal  stenosis.   Summary:  Right: Resting right ankle-brachial index indicates moderate right lower  extremity arterial disease. The right toe-brachial index is abnormal.   Left: Resting left ankle-brachial index indicates moderate left lower  extremity arterial disease. The left toe-brachial index is abnormal.   Carotid dopplers 05/16/20: Summary:  Right Carotid: Velocities in the right ICA are consistent with a 1-39%  stenosis.   Left Carotid: Velocities in the left ICA are consistent with a 40-59%  stenosis.   Vertebrals: Left vertebral artery demonstrates antegrade flow. Right  vertebral        artery demonstrates no discernable flow.  Subclavians: Left subclavian artery was stenotic. Right subclavian artery  flow        was disturbed.   *See table(s) above for measurements and observations.  Suggest follow up study in 12 months.   ASSESSMENT AND PLAN:  1.  Severe dyspnea secondary to severe asthma. Improved symptoms on Trelegy. Fortunately she has no significant obstructive CAD. 2. HTN. Continue enalapril and amlodipine. BP consistently high at doctor's visits but she reports better readings at home. Reluctant to increase meds at this point.  3. HLD. Tolerating Crestor 5 mg twice a week.  Once we can revascularize her LE may be able to push statins more since cramping is probably not statin related.  If truly statin intolerant will need to consider alternate therapy such as PCSK 9 inhibitor.  4. Morbid obesity. She is planning to loose weight with more of a plant based diet.  5. PAD. Severe bilateral common iliac stenosis noted on CT.  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 Some bilateral subclavian disease but no gradient on the right with cath. Plan Aortogram and run off  with Dr [name].  68. Strong family history of premature vascular disease. 7. Former tobacco abuse.  8. Multiple colon polyps. S/p removal 9. Left breast carcinoma. S/p bilateral lumpectomies. She has deferred RT. 10. Nonobstructive CAD.  11. Endometrial CA stage 1A s/p total hysterectomy/BSO 12.  OSA per pulmonary.   Current medicines are reviewed at length with the patient today.  The patient does not have concerns regarding medicines.  The following changes have been made:  See above  Labs/ tests ordered today include:   No orders of the defined types were placed in this encounter.    Disposition:   FU with me 3 months. Signed, Darril Patriarca Martinique, MD  08/01/2020 10:11 AM    [facility] [address] [place], [place], [address] Phone [phone], Fax [phone]

## 2020-07-29 ENCOUNTER — Ambulatory Visit: Payer: 59

## 2020-07-30 ENCOUNTER — Ambulatory Visit: Payer: 59

## 2020-07-31 ENCOUNTER — Ambulatory Visit: Payer: 59

## 2020-08-01 ENCOUNTER — Inpatient Hospital Stay: Payer: 59 | Attending: Hematology | Admitting: Gynecologic Oncology

## 2020-08-01 ENCOUNTER — Encounter: Payer: Self-pay | Admitting: Gynecologic Oncology

## 2020-08-01 ENCOUNTER — Encounter: Payer: Self-pay | Admitting: Cardiology

## 2020-08-01 ENCOUNTER — Ambulatory Visit: Payer: 59

## 2020-08-01 ENCOUNTER — Ambulatory Visit (INDEPENDENT_AMBULATORY_CARE_PROVIDER_SITE_OTHER): Payer: 59 | Admitting: Cardiology

## 2020-08-01 ENCOUNTER — Other Ambulatory Visit: Payer: Self-pay

## 2020-08-01 ENCOUNTER — Telehealth: Payer: Self-pay

## 2020-08-01 VITALS — BP 142/75 | HR 86 | Temp 97.3°F | Resp 20 | Ht 70.0 in | Wt 306.0 lb

## 2020-08-01 VITALS — BP 164/84 | HR 85 | Ht 70.0 in | Wt 306.8 lb

## 2020-08-01 DIAGNOSIS — I739 Peripheral vascular disease, unspecified: Secondary | ICD-10-CM | POA: Diagnosis not present

## 2020-08-01 DIAGNOSIS — I6522 Occlusion and stenosis of left carotid artery: Secondary | ICD-10-CM | POA: Diagnosis not present

## 2020-08-01 DIAGNOSIS — C541 Malignant neoplasm of endometrium: Secondary | ICD-10-CM | POA: Insufficient documentation

## 2020-08-01 DIAGNOSIS — E7800 Pure hypercholesterolemia, unspecified: Secondary | ICD-10-CM

## 2020-08-01 DIAGNOSIS — Z90710 Acquired absence of both cervix and uterus: Secondary | ICD-10-CM | POA: Insufficient documentation

## 2020-08-01 DIAGNOSIS — I10 Essential (primary) hypertension: Secondary | ICD-10-CM | POA: Diagnosis not present

## 2020-08-01 DIAGNOSIS — Z90722 Acquired absence of ovaries, bilateral: Secondary | ICD-10-CM | POA: Insufficient documentation

## 2020-08-01 DIAGNOSIS — J988 Other specified respiratory disorders: Secondary | ICD-10-CM

## 2020-08-01 DIAGNOSIS — Z7189 Other specified counseling: Secondary | ICD-10-CM

## 2020-08-01 NOTE — Telephone Encounter (Signed)
Attempted to call pt on both home and cell phone numbers to make appointment with Dr. [name].

## 2020-08-01 NOTE — Patient Instructions (Signed)
Your cancer is a stage I cancer and no additional treatments are recommended for this.  Please avoid intercourse for at least another 5 weeks.  Kelli Horn will attempt to have you seen by the dentist to have your teeth pulled. If she is unsuccessful with this next attempt to get a response from the dentist, we will pass this over to you to follow-up on as we are unable to manage dental issues. The phone number for you to call to speak with dental is [phone].   Please return to see Dr [name] in 6 months.

## 2020-08-01 NOTE — Progress Notes (Signed)
Gynecologic Oncology Follow-up Note  Chief Complaint:  Chief Complaint  Patient presents with  . Endometrial cancer [facility])    Assessment/Plan:  [name]  is a 64 y.o.  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  Pathology revealed low risk factors for recurrence, therefore no adjuvant therapy is recommended according to NCCN guidelines.  I discussed risk for recurrence and typical symptoms encouraged her to notify us of these should they develop between visits.  I recommend she have follow-up every 6 months for 5 years in accordance with NCCN guidelines. Those visits should include symptom assessment, physical exam and pelvic examination. Pap smears are not indicated or recommended in the routine surveillance of endometrial cancer.  She is a candidate for anti-estrogen therapy for her breast cancer as determined by her oncologist.  We will reach out to dental oncology regarding having her teeth pulled.   HPI: [name] is a 64 year old P2 who was seen in consultation at the request of Dr Paula Compton for evaluation and treatment of grade 1 endometrial cancer in the setting of morbid obesity with a BMI of 43 kg meters squared, recent diagnosis of left sided breast cancer, stage I, ER/PR positive, peripheral vascular disease, coronary artery disease, possible enteric ischemia, COPD, suspected sleep apnea, diabetes mellitus, and several other medical comorbidities.   Her symptoms began in July 2020 when she experienced an episode of vaginal spotting.  She again experienced vaginal spotting in February 2021.  She did not have a gynecologist.  She saw her primary care provider for a routine checkup in the summer 2021 and reported the symptom of vaginal spotting.  At that time her PCP recommend referral to a gynecologist for further work-up.  Work-up of symptoms included a transvaginal ultrasound, office  endometrial biopsy, and CT abdomen and pelvis. Transvaginal US on May 17, 2020 showed a uterus measuring 5.7 x 6.9 x 5.9 cm with an endometrial thickness of 18 mm.  The uterus contained a 4.2 x 3.3 cm fibroid.  The uterus was retroflexed. Endometrial sampling with office [place] biopsy was performed on May 29, 2020 and showed FIGO grade 1 endometrioid adenocarcinoma.   She was diagnosed with ER/PR positive stage I breast cancer in 2021 treated with lumpectomy and sentinel lymph node biopsy.  At the time of her endometrial cancer diagnosis she was awaiting definitive adjuvant radiation therapy to the left chest wall prior to her endometrial cancer diagnosis, but following this surgery she declined radiation despite the recommendation.  She needs to be on antiestrogen therapy for prolonged period of time.  With respect to her vascular disease, Dr. [name] had planned for percutaneous peripheral stenting of the iliacs.  Her mesenteric ischemic symptoms were being worked up with a planned colonoscopy and endoscopy.  Potentially she would have attempts at stenting of her SMA.  The interventional cardiologist were hoping to delay proceeding with endovascular procedures until after she had completed her surgical management for her cancers because she would need to be on prolonged anticoagulant therapy after her stenting procedures.  She was taking 81 mg aspirin.  With respect to her performance status she is unable to walk up a flight of stairs due to claudication pain and shortness of breath.  She denied chest pain but really stresses her self enough to exert what had previously resulted in angina type symptoms.  She had a left heart catheterization and coronary angiography with  Dr. Martinique on 04/23/2020 which showed 25% stenosis of the mid RCA, 25% stenosis of the mid LAD, normal left ventricular systolic function, mild elevation of left ventricular end-diastolic pressure.  65% estimated left ventricular  ejection fraction.  No interventions were necessary at that procedure.  Carotid duplex on 05/16/2020 showed no significant carotid stenosis but absent right vertebral flow and bilateral subclavian stenosis.  Interval Hx:  On 07/11/20 she underwent robotic assisted total hysterectomy for uterus greater than 250 g, BSO. Intraoperative findings were significant for a 12 cm fibroid uterus, normal tubes and ovaries, narrow urethral meatus, no gross extrauterine disease. Surgery was challenging due to body habitus but uncomplicated.  Final pathology revealed a grade 1 endometrioid endometrial adenocarcinoma within an endometrial polyp with less than 1 mm of myometrial invasion (total thickness 30 mm), no lymphovascular space invasion, negative adnexa and cervix.  Lymph nodes not sampled.  MMR intact.  She was determined to have low risk disease for recurrence and no adjuvant therapy was recommended.  Since surgery she had done well with no specific complications. She does desire her vascular procedure to be done by Christmas and desires her teeth be pulled before then.    Current Meds:  Outpatient Encounter Medications as of 08/01/2020  Medication Sig  . albuterol (PROVENTIL HFA;VENTOLIN HFA) 108 (90 Base) MCG/ACT inhaler Inhale 2 puffs into the lungs every 6 (six) hours as needed for wheezing or shortness of breath.   Marland Kitchen albuterol (PROVENTIL) (2.5 MG/3ML) 0.083% nebulizer solution Take 2.5 mg by nebulization every 6 (six) hours as needed for shortness of breath.  Marland Kitchen amLODipine (NORVASC) 2.5 MG tablet Take 1 tablet (2.5 mg total) by mouth daily.  . enalapril (VASOTEC) 10 MG tablet Take 15 mg by mouth 2 (two) times daily.   . Fluticasone-Umeclidin-Vilant (TRELEGY ELLIPTA) 100-62.5-25 MCG/INH AEPB Inhale 1 puff into the lungs daily.  . methocarbamol (ROBAXIN) 750 MG tablet Take 750 mg by mouth 2 (two) times daily as needed for muscle spasms.   Vladimir Faster Glycol-Propyl Glycol (SYSTANE ULTRA) 0.4-0.3 %  SOLN Place 1 drop into both eyes 3 (three) times daily as needed (dry eyes).   . rosuvastatin (CRESTOR) 5 MG tablet Take 5 mg twice a week (Patient taking differently: Take 5 mg by mouth once a week. )   No facility-administered encounter medications on file as of 08/01/2020.    Allergy:  Allergies  Allergen Reactions  . Carvedilol Shortness Of Breath    Reacts with albuterol, worsens asthma symptoms when taken together   . Peanut-Containing Drug Products Anaphylaxis    Asthma attack  . 5-Alpha Reductase Inhibitors     Social Hx:   Social History   Socioeconomic History  . Marital status: Married    Spouse name: Not on file  . Number of children: 2  . Years of education: Not on file  . Highest education level: Not on file  Occupational History  . Occupation: retired   Tobacco Use  . Smoking status: Former Smoker    Packs/day: 2.00    Years: 10.00    Pack years: 20.00    Types: Cigarettes    Quit date: 04/16/2004    Years since quitting: 16.3  . Smokeless tobacco: Never Used  Vaping Use  . Vaping Use: Never used  Substance and Sexual Activity  . Alcohol use: No  . Drug use: No  . Sexual activity: Not Currently  Other Topics Concern  . Not on file  Social History Narrative  . Not  on file   Social Determinants of Health   Financial Resource Strain:   . Difficulty of Paying Living Expenses: Not on file  Food Insecurity:   . Worried About Charity fundraiser in the Last Year: Not on file  . Ran Out of Food in the Last Year: Not on file  Transportation Needs:   . Lack of Transportation (Medical): Not on file  . Lack of Transportation (Non-Medical): Not on file  Physical Activity:   . Days of Exercise per Week: Not on file  . Minutes of Exercise per Session: Not on file  Stress:   . Feeling of Stress : Not on file  Social Connections:   . Frequency of Communication with Friends and Family: Not on file  . Frequency of Social Gatherings with Friends and Family:  Not on file  . Attends Religious Services: Not on file  . Active Member of Clubs or Organizations: Not on file  . Attends Archivist Meetings: Not on file  . Marital Status: Not on file  Intimate Partner Violence:   . Fear of Current or Ex-Partner: Not on file  . Emotionally Abused: Not on file  . Physically Abused: Not on file  . Sexually Abused: Not on file    Past Surgical Hx:  Past Surgical History:  Procedure Laterality Date  . APPENDECTOMY    . BIOPSY  06/27/2020   Procedure: BIOPSY;  Surgeon: [name], MD;  Location: [facility];  Service: Endoscopy;;  . BREAST LUMPECTOMY WITH RADIOACTIVE SEED AND SENTINEL LYMPH NODE BIOPSY Bilateral 06/07/2020   Procedure: BILATERAL BREAST LUMPECTOMY WITH RADIOACTIVE SEED AND LEFT SENTINEL LYMPH NODE BIOPSY;  Surgeon: [name], MD;  Location: [facility];  Service: General;  Laterality: Bilateral;  PEC BLOCK  . COLONOSCOPY WITH PROPOFOL N/A 06/27/2020   Procedure: COLONOSCOPY WITH PROPOFOL;  Surgeon: [name], MD;  Location: [facility];  Service: Endoscopy;  Laterality: N/A;  . LEFT HEART CATH AND CORONARY ANGIOGRAPHY N/A 04/23/2020   Procedure: LEFT HEART CATH AND CORONARY ANGIOGRAPHY;  Surgeon: Martinique, Peter M, MD;  Location: [facility];  Service: Cardiovascular;  Laterality: N/A;  . POLYPECTOMY  06/27/2020   Procedure: POLYPECTOMY;  Surgeon: [name], MD;  Location: [facility];  Service: Endoscopy;;  . ROBOTIC ASSISTED TOTAL HYSTERECTOMY WITH BILATERAL SALPINGO OOPHERECTOMY N/A 07/11/2020   Procedure: XI ROBOTIC ASSISTED TOTAL HYSTERECTOMY WITH BILATERAL SALPINGO OOPHORECTOMY;  Surgeon: [name], MD;  Location: [facility];  Service: Gynecology;  Laterality: N/A;    Past Medical Hx:  Past Medical History:  Diagnosis Date  . Acid reflux   . Anxiety   . Asthma   . Breast cancer ([place]) 05/2020   left breast DCIS  . Chronic kidney disease    Right side question infarction   . COPD (chronic obstructive pulmonary disease) ([place])   . Diabetes mellitus without complication (H[facility])    no meds  . Hyperlipidemia   . Hypertension   . PAD (peripheral artery disease) ([place])   . Peripheral vascular disease ([place])    severe athrosclerosis  . Sleep apnea     no cpap at this time. Sleep study done Jun 20 2020    Past Gynecological History:  SVD x 2. Patient's last menstrual period was 12/09/2013.  Family Hx:  Family History  Problem Relation Age of Onset  . Heart attack Father 59  . Aneurysm Father   . Breast cancer Maternal Aunt   . Lung cancer Mother   .  Diabetes Other   . Heart disease Paternal Uncle   . Aneurysm Paternal Uncle     Review of Systems:  Constitutional  Feels well,    ENT Normal appearing ears and nares bilaterally Skin/Breast  No rash, sores, jaundice, itching, dryness Cardiovascular  + SOB, LE pain with ambulation, nocturnal leg pain  Pulmonary  + cough or wheeze.  Gastro Intestinal  + intermittent melena and diarrhea Genito Urinary  No frequency, urgency, dysuria, no bleeding Musculo Skeletal  No myalgia, arthralgia, joint swelling or pain  Neurologic  No weakness, numbness, change in gait,  Psychology  No depression, anxiety, insomnia.   Vitals:  Blood pressure (!) 142/75, pulse 86, temperature (!) 97.3 F (36.3 C), temperature source Tympanic, resp. rate 20, height 5' 10"  (1.778 m), weight (!) 306 lb (138.8 kg), last menstrual period 12/09/2013, SpO2 97 %.  Physical Exam: WD in NAD Neck  Supple NROM, without any enlargements.  Lymph Node Survey No cervical supraclavicular or inguinal adenopathy Cardiovascular  Well perfused peripheries Lungs  No increased WOB Skin  No rash/lesions/breakdown  Psychiatry  Alert and oriented to person, place, and time  Abdomen  Normoactive bowel sounds, abdomen soft, non-tender and obese without evidence of hernia. Well healed incisions Back No CVA tenderness Genito Urinary   Vulva/vagina: Normal external female genitalia.  No lesions. No discharge or bleeding.  Bladder/urethra:  No lesions or masses, well supported bladder  Vagina: cuff healing normally, no blood, no lesions.  Rectal  deferred Extremities  No bilateral cyanosis, clubbing or edema.   30 minutes of direct face to face counseling time was spent with the patient. This included discussion about prognosis, therapy recommendations and postoperative side effects and are beyond the scope of routine postoperative care.  Thereasa Solo, MD  08/01/2020, 5:58 PM

## 2020-08-02 ENCOUNTER — Ambulatory Visit: Payer: 59

## 2020-08-02 ENCOUNTER — Ambulatory Visit (INDEPENDENT_AMBULATORY_CARE_PROVIDER_SITE_OTHER): Payer: 59 | Admitting: Cardiovascular Disease

## 2020-08-02 ENCOUNTER — Telehealth: Payer: Self-pay | Admitting: Oncology

## 2020-08-02 ENCOUNTER — Encounter: Payer: Self-pay | Admitting: Cardiovascular Disease

## 2020-08-02 VITALS — BP 186/73 | HR 82 | Ht 70.0 in | Wt 308.8 lb

## 2020-08-02 DIAGNOSIS — Z01812 Encounter for preprocedural laboratory examination: Secondary | ICD-10-CM

## 2020-08-02 DIAGNOSIS — I739 Peripheral vascular disease, unspecified: Secondary | ICD-10-CM

## 2020-08-02 MED ORDER — SODIUM CHLORIDE 0.9% FLUSH: 3.0000 mL | Freq: Two times a day (BID) | INTRAVENOUS | Status: DC

## 2020-08-02 NOTE — Assessment & Plan Note (Signed)
Mr. Kelli Horn is referred to me by Dr. Martinique for symptomatic PAD.  She does have lifestyle limiting claudication.  She had lower extremity arterial Dopplers performed in our office 05/16/2020 revealing ABIs in the 0.6 range bilaterally with monophasic iliac waveforms.  Prior abdominal CT with contrast confirmed high-grade diffuse iliac disease.  The patient wishes to proceed with angiography and potential endovascular therapy for lifestyle limiting claudication.

## 2020-08-02 NOTE — Progress Notes (Signed)
[place]   [phone] [phone]   Clinic Follow up Note   Patient Care Team: [name], MD as PCP - General (Family Medicine) Martinique, Peter M, MD as PCP - Cardiology (Cardiology) Mauro Kaufmann, RN as Oncology Nurse Navigator Rockwell Germany, RN as Oncology Nurse Navigator [name], MD as Consulting Physician (General Surgery) Truitt Merle, MD as Consulting Physician (Hematology) Eppie Gibson, MD as Attending Physician (Radiation Oncology)  Date of Service:  08/05/2020  CHIEF COMPLAINT: F/u of left breast Cancer  SUMMARY OF ONCOLOGIC HISTORY: Oncology History Overview Note  Cancer Staging Ductal carcinoma in situ (DCIS) of left breast Staging form: Breast, AJCC 8th Edition - Clinical stage from 05/22/2020: Stage 0 (cTis (DCIS), cN0, cM0, ER+, PR+) - Unsigned    Ductal carcinoma in situ (DCIS) of left breast  04/26/2020 Mammogram   IMPRESSION: 1. There is a possible subtle distortion in the lateral right breast without a definite sonographic correlate.   2.  No evidence of right axillary lymphadenopathy.   3. There are 2 adjacent abutting masses in the left breast at 3:30, 7cmfn which are indeterminate. One of these is an irregular mass measuring 9 x 3 x 5 mm. An adjacent abutting mass measures 4 x 3 x 4 mm.   4.  No evidence of left axillary lymphadenopathy.     05/10/2020 Initial Biopsy   Diagnosis 1. Breast, left, needle core biopsy, 3:30 o'clock - CARCINOMA INVOLVING PAPILLARY LESION; SEE COMMENT. 2. Breast, right, needle core biopsy, lower, outer quadrant - COMPLEX SCLEROSING LESION WITH CALCIFICATIONS. Microscopic Comment 1. The differential diagnosis includes invasive papillary carcinoma and solid papillary carcinoma. Immunohistochemistry for E-cadherin is positive. CK5/6 is negative. SMM-1 is positive, Calponin is equivocal and P63 is negative.   1. PROGNOSTIC INDICATORS Results: IMMUNOHISTOCHEMICAL  AND MORPHOMETRIC ANALYSIS PERFORMED MANUALLY Estrogen Receptor: 100%, POSITIVE, STRONG STAINING INTENSITY Progesterone Receptor: 100%, POSITIVE, STRONG STAINING INTENSITY   05/17/2020 Initial Diagnosis   Ductal carcinoma in situ (DCIS) of left breast   06/07/2020 Surgery   BILATERAL BREAST LUMPECTOMY WITH RADIOACTIVE SEED AND LEFT SENTINEL LYMPH NODE BIOPSY by Dr Marlou Starks   06/07/2020 Pathology Results   FINAL MICROSCOPIC DIAGNOSIS:   A. 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  - Margins of resection are not involved (Closest margin: 2 mm, medial).  - Biopsy site.  - See oncology table.   B. BREAST, RIGHT, LUMPECTOMY:  - Fibrocystic change.  - Biopsy site.   C. SENTINEL LYMPH NODE, LEFT #1, BIOPSY:  - One lymph node, negative for carcinoma (0/1).    Endometrial cancer ([place])  06/27/2020 Procedure   Colonoscopy by Dr Michail Sermon  IMPRESSION Preparation of the colon was fair. - One 7 mm polyp in the cecum, removed with a hot snare. Resected and retrieved. - Seven 3 to 7 mm polyps in the sigmoid colon, removed with a hot snare. Resected and retrieved. - One 9 mm polyp in the descending colon, removed with a hot snare. Resected and retrieved. - Three 1 to 4 mm polyps in the sigmoid colon, removed with a cold biopsy forceps. Resected and retrieved. - Two 3 to 7 mm polyps in the rectum, removed with a hot snare. Resected and retrieved. - Diverticulosis in the sigmoid colon. - Medium-sized lipoma in the ascending colon. - Internal hemorrhoids. - Non-thrombosed external hemorrhoids and hypertrophied anal papilla(e) found on perianal exam.    FINAL MICROSCOPIC DIAGNOSIS:   A. COLON, CECUM/SIGMOID,  POLYPECTOMY:  - Tubular adenoma without high-grade dysplasia or malignancy  - Hyperplastic polyp(s)   B. COLON, DESCENDING, POLYPECTOMY:  - Hyperplastic polyp   C. COLON, SIGMOID, POLYPECTOMY:  - Hyperplastic polyp(s)   D. RECTUM,  POLYPECTOMY:  - Hyperplastic polyp(s)    07/11/2020 Surgery   XI ROBOTIC ASSISTED TOTAL HYSTERECTOMY WITH BILATERAL SALPINGO OOPHORECTOMY by Dr [name]   07/11/2020 Initial Diagnosis   Endometrial cancer ([place])   07/11/2020 Initial Biopsy   FINAL MICROSCOPIC DIAGNOSIS:   A. UTERUS, CERVIX, BILATERAL FALLOPIAN TUBES AND OVARIES:  Uterus:  -  Endometrioid carcinoma, FIGO grade 1, partially involving endometrial  polyp  -  Leiomyoma (4.0 cm; largest)  -  See oncology table and comment below   Cervix:  -  Benign endocervical polyp  -  No carcinoma identified   Bilateral Ovaries:  -  No carcinoma identified   Bilateral Fallopian tubes:  -  No carcinoma identified    Mismatch Repair Protein (IHC)   SUMMARY INTERPRETATION: NORMAL    IHC EXPRESSION RESULTS   TEST           RESULT  MLH1:          Preserved nuclear expression  MSH2:          Preserved nuclear expression  MSH6:          Preserved nuclear expression  PMS2:          Preserved nuclear expression       CURRENT THERAPY:  PENDING AI/Surveillance   INTERVAL HISTORY:  Kelli Horn is here for a follow up. She presents to the clinic alone. She notes she has recovered from surgeries. She is having angiography with stent placement being don on her LE due to severe arthrosclerosis. She notes her last period was 8-10 years ago. She has not had DEXA scan. She notes vaginal dryness and her Gyn started her on cream and not sure if it is estrogen containing.     REVIEW OF SYSTEMS:   Constitutional: Denies fevers, chills or abnormal weight loss Eyes: Denies blurriness of vision Ears, nose, mouth, throat, and face: Denies mucositis or sore throat Respiratory: Denies cough, dyspnea or wheezes Cardiovascular: Denies palpitation, chest discomfort or lower extremity swelling Gastrointestinal:  Denies nausea, heartburn or change in bowel habits Skin: Denies abnormal skin rashes Lymphatics: Denies new lymphadenopathy  or easy bruising Neurological:Denies numbness, tingling or new weaknesses Behavioral/Psych: Mood is stable, no new changes  All other systems were reviewed with the patient and are negative.  MEDICAL HISTORY:  Past Medical History:  Diagnosis Date  . Acid reflux   . Anxiety   . Asthma   . Breast cancer ([place]) 05/2020   left breast DCIS  . Chronic kidney disease    Right side question infarction  . COPD (chronic obstructive pulmonary disease) ([place])   . Diabetes mellitus without complication (H[facility])    no meds  . Hyperlipidemia   . Hypertension   . PAD (peripheral artery disease) ([place])   . Peripheral vascular disease ([place])    severe athrosclerosis  . Sleep apnea     no cpap at this time. Sleep study done Jun 20 2020    SURGICAL HISTORY: Past Surgical History:  Procedure Laterality Date  . APPENDECTOMY    . BIOPSY  06/27/2020   Procedure: BIOPSY;  Surgeon: [name], MD;  Location: [facility];  Service: Endoscopy;;  . BREAST LUMPECTOMY WITH RADIOACTIVE SEED AND SENTINEL LYMPH NODE BIOPSY Bilateral 06/07/2020   Procedure:  BILATERAL BREAST LUMPECTOMY WITH RADIOACTIVE SEED AND LEFT SENTINEL LYMPH NODE BIOPSY;  Surgeon: [name], MD;  Location: [facility];  Service: General;  Laterality: Bilateral;  PEC BLOCK  . COLONOSCOPY WITH PROPOFOL N/A 06/27/2020   Procedure: COLONOSCOPY WITH PROPOFOL;  Surgeon: [name], MD;  Location: [facility];  Service: Endoscopy;  Laterality: N/A;  . LEFT HEART CATH AND CORONARY ANGIOGRAPHY N/A 04/23/2020   Procedure: LEFT HEART CATH AND CORONARY ANGIOGRAPHY;  Surgeon: Martinique, Peter M, MD;  Location: [facility];  Service: Cardiovascular;  Laterality: N/A;  . POLYPECTOMY  06/27/2020   Procedure: POLYPECTOMY;  Surgeon: [name], MD;  Location: [facility];  Service: Endoscopy;;  . ROBOTIC ASSISTED TOTAL HYSTERECTOMY WITH BILATERAL SALPINGO OOPHERECTOMY N/A 07/11/2020   Procedure: XI ROBOTIC ASSISTED  TOTAL HYSTERECTOMY WITH BILATERAL SALPINGO OOPHORECTOMY;  Surgeon: [name], MD;  Location: [facility];  Service: Gynecology;  Laterality: N/A;    I have reviewed the social history and family history with the patient and they are unchanged from previous note.  ALLERGIES:  is allergic to beta adrenergic blockers, carvedilol, and peanut-containing drug products.  MEDICATIONS:  Current Outpatient Medications  Medication Sig Dispense Refill  . albuterol (PROVENTIL HFA;VENTOLIN HFA) 108 (90 Base) MCG/ACT inhaler Inhale 2 puffs into the lungs every 6 (six) hours as needed for wheezing or shortness of breath.     Marland Kitchen albuterol (PROVENTIL) (2.5 MG/3ML) 0.083% nebulizer solution Take 2.5 mg by nebulization every 6 (six) hours as needed for shortness of breath.    Marland Kitchen amLODipine (NORVASC) 5 MG tablet Take 5 mg by mouth daily.  180 tablet 3  . aspirin EC 81 MG tablet Take 81 mg by mouth daily. Swallow whole.    . b complex vitamins capsule Take 1 capsule by mouth daily.    . enalapril (VASOTEC) 10 MG tablet Take 15 mg by mouth 2 (two) times daily.     . Fluticasone-Umeclidin-Vilant (TRELEGY ELLIPTA) 100-62.5-25 MCG/INH AEPB Inhale 1 puff into the lungs daily. 1 each 6  . methocarbamol (ROBAXIN) 750 MG tablet Take 750 mg by mouth 2 (two) times daily as needed for muscle spasms.     Vladimir Faster Glycol-Propyl Glycol (SYSTANE ULTRA) 0.4-0.3 % SOLN Place 1 drop into both eyes 3 (three) times daily as needed (dry eyes).     . rosuvastatin (CRESTOR) 5 MG tablet Take 5 mg twice a week (Patient taking differently: Take 5 mg by mouth every Saturday. ) 30 tablet 6   Current Facility-Administered Medications  Medication Dose Route Frequency Provider Last Rate Last Admin  . sodium chloride flush (NS) 0.9 % injection 3 mL  3 mL Intravenous Q12H [name], MD        PHYSICAL EXAMINATION: ECOG PERFORMANCE STATUS: 1  Vitals:   08/05/20 1155  BP: (!) 161/77  Pulse: 88  Resp: 19  Temp: 97.8 F (36.6 C)   SpO2: 97%   Filed Weights   08/05/20 1155  Weight: (!) 305 lb 12.8 oz (138.7 kg)    GENERAL:alert, no distress and comfortable SKIN: skin color, texture, turgor are normal, no rashes or significant lesions EYES: normal, Conjunctiva are pink and non-injected, sclera clear  NECK: supple, thyroid normal size, non-tender, without nodularity LYMPH:  no palpable lymphadenopathy in the cervical, axillary  LUNGS: clear to auscultation and percussion with normal breathing effort HEART: regular rate & rhythm and no murmurs and no lower extremity edema ABDOMEN:abdomen soft, non-tender and normal bowel sounds Musculoskeletal:no cyanosis of digits  and no clubbing  NEURO: alert & oriented x 3 with fluent speech, no focal motor/sensory deficits BREAST: S/p b/l lumpectomy: Surgical incisions healed well with a tiny shallow right breast skin ulcer along the incision, resolving.  No palpable mass, nodules or adenopathy bilaterally. Breast exam benign.   LABORATORY DATA:  I have reviewed the data as listed CBC Latest Ref Rng & Units 08/02/2020 07/05/2020 05/22/2020  WBC 3.4 - 10.8 x10E3/uL 13.8(H) 9.5 11.5(H)  Hemoglobin 11.1 - 15.9 g/dL 13.0 13.3 13.7  Hematocrit 34.0 - 46.6 % 39.3 41.5 42.1  Platelets 150 - 450 x10E3/uL 466(H) 389 415(H)     CMP Latest Ref Rng & Units 08/02/2020 07/05/2020 05/22/2020  Glucose 65 - 99 mg/dL 101(H) 117(H) 115(H)  BUN 8 - 27 mg/dL _0 Creatinine 0.57 - 1.00 mg/dL 0.95 0.80 0.82  Sodium 134 - 144 mmol/L 141 140 137  Potassium 3.5 - 5.2 mmol/L 4.5 4.1 4.2  Chloride 96 - 106 mmol/L 102 104 103  CO2 20 - 29 mmol/L _1 Calcium 8.7 - 10.3 mg/dL 9.4 9.1 9.5  Total Protein 6.5 - 8.1 g/dL - 7.3 7.7  Total Bilirubin 0.3 - 1.2 mg/dL - 0.4 0.4  Alkaline Phos 38 - 126 U/L - 78 91  AST 15 - 41 U/L - 16 15  ALT 0 - 44 U/L - 17 16      RADIOGRAPHIC STUDIES: I have personally reviewed the radiological images as listed and agreed with the findings in the  report. No results found.   ASSESSMENT & PLAN:  Kelli Horn is a 64 y.o. female with    1. Left Breast DCIS, ER+/PR+ -She was diagnosed in 05/2020. She was found to have 2 small lesions in her left breast with carcinoma involving papillary lesions, but no definitive type and staging. Given symptomatic right breast changes her right breast was also biopsied and path showed complex sclerotic lesion with calcifications.  -She underwent b/l breast lumpectomy with Dr Marlou Starks on 06/07/20. Surgical path showed 1.3cm Solid papillary carcinoma without invasive carcinoma completely removed with clear margins. Her right breast path was negative for malignancy. I reviewed in detail with patient today.  -Her DCIS was cured by complete surgical resection. Any form of adjuvant therapy is preventive.  -Given DCIS, she opted to not proceed with Adjuvant Radiation.  -She has 4 distant relatives with breast cancer. I reviewed her risk and treatment benefits using the Breast Cancer Nomogram from [facility]). Based on family, PMx and lifestyle she has a 16% risk of developing future breast cancer in the next 10 years. Her risk would drop to 8% Antiestrogen therapy alone.  -Given her strongly positive ER and PR, I do recommend antiestrogen therapy with AI or Tamoxifen for 5 years, which decrease her risk of future breast cancer.   -The potential benefit and side effects, which includes but not limited to, hot flash, skin and vaginal dryness, metabolic changes ( increased blood glucose, cholesterol, weight, etc.), slightly in increased risk of cardiovascular disease, cataracts, muscular and joint discomfort, osteopenia and osteoporosis, etc, were discussed with her in great details.  -Given Comorbidities and sensitivity to medication, she may not proceed with antiestrogen therapy. This is reasonable. We also discussed a shorter course (3 years) or lower dose tamoxifen 93m (instead of  standard 271m   -I discussed if she does not proceed with antiestrogen therapy I would recommend closer surveillance. She will continue annual screening mammogram, self  exams, and a routine office visit with lab and exam with [name]. I also discussed additional screening with annual breast MRIs. She is interested if given with medication as she had claustrophobia.  -Phone call in 3 months   2. Endometrial Cancer, T1a, Grade I, MSI-stable  -She underwent BSO and total hysterectomy by Dr [name] on 07/11/20. Surgical path showed Endometrioid carcinoma, FIGO grade 1, partially involving endometrial polyp. -She did not require chemotherapy. She will continue to F/u with Dr [name].    3. Comorbidites: HTN, DM, DDD/Arthritis, HLD, CAD s/p cardiac cath in 03/2020 -Managed on Amlodipine, baby aspirin, enalapril, Trelegy, Hydroxyzine, Nitroglycerin -Her arthritis is managed on ibuprofen and Robaxin.  -She plans to undergo abdominal aortogram with LE for stent placement on 08/08/20 due to arthrosclerosis. She will be started on anticoagulant afterward.   4. Vaginal dryness  -Her Gyn prescribed her steroid cream. This is not estrogen containing, she is fine to continue.    PLAN:  -she will proceed her vascular surgery soon -Phone call in 3 months to discuss antiestrogen therapy again.  -DEXA scan before next visit   No problem-specific Assessment & Plan notes found for this encounter.   Orders Placed This Encounter  Procedures  . DG Bone Density    Standing Status:   Future    Standing Expiration Date:   08/06/2021    Order Specific Question:   Reason for Exam (SYMPTOM  OR DIAGNOSIS REQUIRED)    Answer:   screening    Order Specific Question:   Preferred imaging location?    Answer:   [facility]   All questions were answered. The patient knows to call the clinic with any problems, questions or concerns. No barriers to learning was detected. The total time spent in the appointment was 30  minutes.     Truitt Merle, MD 08/05/2020   I, Joslyn Devon, am acting as scribe for Truitt Merle, MD.   I have reviewed the above documentation for accuracy and completeness, and I agree with the above.

## 2020-08-02 NOTE — Progress Notes (Signed)
08/02/2020 Kelli Horn   12/31/1955  [id number]  Primary Physician Kelli Downing, MD Primary Cardiologist: Kelli Harp MD Kelli Horn, [place], [place]  HPI:  Kelli Horn is a 64 y.o.  morbidly obese married Caucasian female mother of 2 children, grandmother of 1 grandchild referred by Kelli Horn for peripheral vascular evaluation because of lifestyle limiting claudication.  I last saw her in the office 05/29/2020. Risk factors include 30 pack years of tobacco abuse having quit 10 to 15 years ago.  Treated hypertension, hyperlipidemia and untreated diabetes.  She is never had a heart attack or stroke.  She did have a recent heart catheterization performed by Kelli Horn which showed no evidence of CAD.  She also has breast cancer and is scheduled to have surgery in the near future with radiation therapy after that.  She has been treated with for COPD and reactive airways disease as well.  She has had lifestyle limiting claudication for many years with recent Dopplers that show ABIs in the 0.6 range bilaterally with monophasic waveforms and an abdominal CTA performed 04/05/2020 that suggested severe aortoiliac disease.  Since I saw her 2 months ago she has had bilateral breast lumpectomies, hysterectomy with bilateral oophorectomy laparoscopically.  She is now ready to proceed with lower extremity angiography and endovascular therapy.   Current Meds  Medication Sig  . albuterol (PROVENTIL HFA;VENTOLIN HFA) 108 (90 Base) MCG/ACT inhaler Inhale 2 puffs into the lungs every 6 (six) hours as needed for wheezing or shortness of breath.   Marland Kitchen albuterol (PROVENTIL) (2.5 MG/3ML) 0.083% nebulizer solution Take 2.5 mg by nebulization every 6 (six) hours as needed for shortness of breath.  Marland Kitchen amLODipine (NORVASC) 2.5 MG tablet Take 1 tablet (2.5 mg total) by mouth daily.  . enalapril (VASOTEC) 10 MG tablet Take 15 mg by mouth 2 (two) times daily.   . Fluticasone-Umeclidin-Vilant  (TRELEGY ELLIPTA) 100-62.5-25 MCG/INH AEPB Inhale 1 puff into the lungs daily.  . methocarbamol (ROBAXIN) 750 MG tablet Take 750 mg by mouth 2 (two) times daily as needed for muscle spasms.   Kelli Horn (SYSTANE ULTRA) 0.4-0.3 % SOLN Place 1 drop into both eyes 3 (three) times daily as needed (dry eyes).   . rosuvastatin (CRESTOR) 5 MG tablet Take 5 mg twice a week (Patient taking differently: Take 5 mg by mouth once a week. )     Allergies  Allergen Reactions  . Carvedilol Shortness Of Breath    Reacts with albuterol, worsens asthma symptoms when taken together   . Peanut-Containing Drug Products Anaphylaxis    Asthma attack  . 5-Alpha Reductase Inhibitors     Social History   Socioeconomic History  . Marital status: Married    Spouse name: Not on file  . Number of children: 2  . Years of education: Not on file  . Highest education level: Not on file  Occupational History  . Occupation: retired   Tobacco Use  . Smoking status: Former Smoker    Packs/day: 2.00    Years: 10.00    Pack years: 20.00    Types: Cigarettes    Quit date: 04/16/2004    Years since quitting: 16.3  . Smokeless tobacco: Never Used  Vaping Use  . Vaping Use: Never used  Substance and Sexual Activity  . Alcohol use: No  . Drug use: No  . Sexual activity: Not Currently  Other Topics Concern  . Not on file  Social History Narrative  .  Not on file   Social Determinants of Horn   Financial Resource Strain:   . Difficulty of Paying Living Expenses: Not on file  Food Insecurity:   . Worried About Charity fundraiser in the Last Year: Not on file  . Ran Out of Food in the Last Year: Not on file  Transportation Needs:   . Lack of Transportation (Medical): Not on file  . Lack of Transportation (Non-Medical): Not on file  Physical Activity:   . Days of Exercise per Week: Not on file  . Minutes of Exercise per Session: Not on file  Stress:   . Feeling of Stress : Not on file   Social Connections:   . Frequency of Communication with Friends and Family: Not on file  . Frequency of Social Gatherings with Friends and Family: Not on file  . Attends Religious Services: Not on file  . Active Member of Clubs or Organizations: Not on file  . Attends Archivist Meetings: Not on file  . Marital Status: Not on file  Intimate Partner Violence:   . Fear of Current or Ex-Partner: Not on file  . Emotionally Abused: Not on file  . Physically Abused: Not on file  . Sexually Abused: Not on file     Review of Systems: General: negative for chills, fever, night sweats or weight changes.  Cardiovascular: negative for chest pain, dyspnea on exertion, edema, orthopnea, palpitations, paroxysmal nocturnal dyspnea or shortness of breath Dermatological: negative for rash Respiratory: negative for cough or wheezing Urologic: negative for hematuria Abdominal: negative for nausea, vomiting, diarrhea, bright red blood per rectum, melena, or hematemesis Neurologic: negative for visual changes, syncope, or dizziness All other systems reviewed and are otherwise negative except as noted above.    Blood pressure (!) 186/73, pulse 82, height 5\' 10"  (1.778 m), weight (!) 308 lb 12.8 oz (140.1 kg), last menstrual period 12/09/2013, SpO2 96 %.  General appearance: alert and no distress Neck: no adenopathy, no JVD, supple, symmetrical, trachea midline, thyroid not enlarged, symmetric, no tenderness/mass/nodules and Bilateral subclavian bruits Lungs: clear to auscultation bilaterally Heart: regular rate and rhythm, S1, S2 normal, no murmur, click, rub or gallop Extremities: extremities normal, atraumatic, no cyanosis or edema Pulses: 2+ and symmetric Skin: Skin color, texture, turgor normal. No rashes or lesions Neurologic: Alert and oriented X 3, normal strength and tone. Normal symmetric reflexes. Normal coordination and gait  EKG not performed today  ASSESSMENT AND PLAN:   PAD  (peripheral artery disease) Kelli Horn) Kelli Horn is referred to me by Kelli Horn for symptomatic PAD.  She does have lifestyle limiting claudication.  She had lower extremity arterial Dopplers performed in our office 05/16/2020 revealing ABIs in the 0.6 range bilaterally with monophasic iliac waveforms.  Prior abdominal CT with contrast confirmed high-grade diffuse iliac disease.  The patient wishes to proceed with angiography and potential endovascular therapy for lifestyle limiting claudication.      Kelli Harp MD FACP,FACC,FAHA, [facility] 08/02/2020 4:07 PM

## 2020-08-02 NOTE — Patient Instructions (Signed)
    [place] [address] Dept: [phone] Loc: [phone]  Kelli Horn  08/02/2020  You are scheduled for a Peripheral Angiogram on Thursday, December 9 with Dr. Quay Burow.  1. Please arrive at the [facility] (Main Entrance A) at [facility]: [address] [place], [place] [address] at 7:30AM (This time is two hours before your procedure to ensure your preparation). Free valet parking service is available.   Special note: Every effort is made to have your procedure done on time. Please understand that emergencies sometimes delay scheduled procedures.  2. Diet: Do not eat solid foods after midnight.  The patient may have clear liquids until 5am upon the day of the procedure.  3. Labs: You will need to have blood drawn today  4. Medication instructions in preparation for your procedure:   On the morning of your procedure, take your Aspirin and any morning medicines NOT listed above.  You may use sips of water.  5. Plan for one night stay--bring personal belongings. 6. Bring a current list of your medications and current insurance cards. 7. You MUST have a responsible person to drive you home. 8. Someone MUST be with you the first 24 hours after you arrive home or your discharge will be delayed. 9. Please wear clothes that are easy to get on and off and wear slip-on shoes.  Thank you for allowing [name] to care for you!   -- [facility]  You will need a COVID-19  test prior to your procedure. You are scheduled for 12/8 at 8:45 AM. This is a Drive Up Visit at [address] [place], [place] [address]. Someone will direct you to the appropriate testing line. Stay in your car and someone will be with you shortly.  Will schedule LEA dopplers for 1 week after procedure and an appointment with Dr. [name] 2 weeks after  procedure.

## 2020-08-02 NOTE — H&P (View-Only) (Signed)
08/02/2020 Kelli Horn   1956-07-21  [id number]  Primary Physician [name], MD Primary Cardiologist: [name] MD Garret Reddish, [place], [place]  HPI:  Kelli Horn is a 65 y.o.  morbidly obese married Caucasian female mother of 2 children, grandmother of 1 grandchild referred by Dr. Martinique for peripheral vascular evaluation because of lifestyle limiting claudication.  I last saw her in the office 05/29/2020. Risk factors include 30 pack years of tobacco abuse having quit 10 to 15 years ago.  Treated hypertension, hyperlipidemia and untreated diabetes.  She is never had a heart attack or stroke.  She did have a recent heart catheterization performed by Dr. Martinique which showed no evidence of CAD.  She also has breast cancer and is scheduled to have surgery in the near future with radiation therapy after that.  She has been treated with for COPD and reactive airways disease as well.  She has had lifestyle limiting claudication for many years with recent Dopplers that show ABIs in the 0.6 range bilaterally with monophasic waveforms and an abdominal CTA performed 04/05/2020 that suggested severe aortoiliac disease.  Since I saw her 2 months ago she has had bilateral breast lumpectomies, hysterectomy with bilateral oophorectomy laparoscopically.  She is now ready to proceed with lower extremity angiography and endovascular therapy.   Current Meds  Medication Sig  . albuterol (PROVENTIL HFA;VENTOLIN HFA) 108 (90 Base) MCG/ACT inhaler Inhale 2 puffs into the lungs every 6 (six) hours as needed for wheezing or shortness of breath.   Marland Kitchen albuterol (PROVENTIL) (2.5 MG/3ML) 0.083% nebulizer solution Take 2.5 mg by nebulization every 6 (six) hours as needed for shortness of breath.  Marland Kitchen amLODipine (NORVASC) 2.5 MG tablet Take 1 tablet (2.5 mg total) by mouth daily.  . enalapril (VASOTEC) 10 MG tablet Take 15 mg by mouth 2 (two) times daily.   . Fluticasone-Umeclidin-Vilant  (TRELEGY ELLIPTA) 100-62.5-25 MCG/INH AEPB Inhale 1 puff into the lungs daily.  . methocarbamol (ROBAXIN) 750 MG tablet Take 750 mg by mouth 2 (two) times daily as needed for muscle spasms.   Vladimir Faster Glycol-Propyl Glycol (SYSTANE ULTRA) 0.4-0.3 % SOLN Place 1 drop into both eyes 3 (three) times daily as needed (dry eyes).   . rosuvastatin (CRESTOR) 5 MG tablet Take 5 mg twice a week (Patient taking differently: Take 5 mg by mouth once a week. )     Allergies  Allergen Reactions  . Carvedilol Shortness Of Breath    Reacts with albuterol, worsens asthma symptoms when taken together   . Peanut-Containing Drug Products Anaphylaxis    Asthma attack  . 5-Alpha Reductase Inhibitors     Social History   Socioeconomic History  . Marital status: Married    Spouse name: Not on file  . Number of children: 2  . Years of education: Not on file  . Highest education level: Not on file  Occupational History  . Occupation: retired   Tobacco Use  . Smoking status: Former Smoker    Packs/day: 2.00    Years: 10.00    Pack years: 20.00    Types: Cigarettes    Quit date: 04/16/2004    Years since quitting: 16.3  . Smokeless tobacco: Never Used  Vaping Use  . Vaping Use: Never used  Substance and Sexual Activity  . Alcohol use: No  . Drug use: No  . Sexual activity: Not Currently  Other Topics Concern  . Not on file  Social History Narrative  .  Not on file   Social Determinants of Health   Financial Resource Strain:   . Difficulty of Paying Living Expenses: Not on file  Food Insecurity:   . Worried About Charity fundraiser in the Last Year: Not on file  . Ran Out of Food in the Last Year: Not on file  Transportation Needs:   . Lack of Transportation (Medical): Not on file  . Lack of Transportation (Non-Medical): Not on file  Physical Activity:   . Days of Exercise per Week: Not on file  . Minutes of Exercise per Session: Not on file  Stress:   . Feeling of Stress : Not on file   Social Connections:   . Frequency of Communication with Friends and Family: Not on file  . Frequency of Social Gatherings with Friends and Family: Not on file  . Attends Religious Services: Not on file  . Active Member of Clubs or Organizations: Not on file  . Attends Archivist Meetings: Not on file  . Marital Status: Not on file  Intimate Partner Violence:   . Fear of Current or Ex-Partner: Not on file  . Emotionally Abused: Not on file  . Physically Abused: Not on file  . Sexually Abused: Not on file     Review of Systems: General: negative for chills, fever, night sweats or weight changes.  Cardiovascular: negative for chest pain, dyspnea on exertion, edema, orthopnea, palpitations, paroxysmal nocturnal dyspnea or shortness of breath Dermatological: negative for rash Respiratory: negative for cough or wheezing Urologic: negative for hematuria Abdominal: negative for nausea, vomiting, diarrhea, bright red blood per rectum, melena, or hematemesis Neurologic: negative for visual changes, syncope, or dizziness All other systems reviewed and are otherwise negative except as noted above.    Blood pressure (!) 186/73, pulse 82, height 5\' 10"  (1.778 m), weight (!) 308 lb 12.8 oz (140.1 kg), last menstrual period 12/09/2013, SpO2 96 %.  General appearance: alert and no distress Neck: no adenopathy, no JVD, supple, symmetrical, trachea midline, thyroid not enlarged, symmetric, no tenderness/mass/nodules and Bilateral subclavian bruits Lungs: clear to auscultation bilaterally Heart: regular rate and rhythm, S1, S2 normal, no murmur, click, rub or gallop Extremities: extremities normal, atraumatic, no cyanosis or edema Pulses: 2+ and symmetric Skin: Skin color, texture, turgor normal. No rashes or lesions Neurologic: Alert and oriented X 3, normal strength and tone. Normal symmetric reflexes. Normal coordination and gait  EKG not performed today  ASSESSMENT AND PLAN:   PAD  (peripheral artery disease) [facility]) Mr. Rupnow is referred to me by Dr. Martinique for symptomatic PAD.  She does have lifestyle limiting claudication.  She had lower extremity arterial Dopplers performed in our office 05/16/2020 revealing ABIs in the 0.6 range bilaterally with monophasic iliac waveforms.  Prior abdominal CT with contrast confirmed high-grade diffuse iliac disease.  The patient wishes to proceed with angiography and potential endovascular therapy for lifestyle limiting claudication.      [name] MD FACP,FACC,FAHA, [facility] 08/02/2020 4:07 PM

## 2020-08-02 NOTE — Telephone Encounter (Signed)
Left a message for dental medicine regarding scheduling patient for an appointment. Requested a return call.

## 2020-08-02 NOTE — Addendum Note (Signed)
Addended by: Beatrix Fetters on: 08/02/2020 04:31 PM   Modules accepted: Orders

## 2020-08-02 NOTE — Addendum Note (Signed)
Addended by: Beatrix Fetters on: 08/02/2020 05:08 PM   Modules accepted: Orders, SmartSet

## 2020-08-03 LAB — BASIC METABOLIC PANEL
BUN/Creatinine Ratio: 15 (ref 12–28)
BUN: 14 mg/dL (ref 8–27)
CO2: 24 mmol/L (ref 20–29)
Calcium: 9.4 mg/dL (ref 8.7–10.3)
Chloride: 102 mmol/L (ref 96–106)
Creatinine, Ser: 0.95 mg/dL (ref 0.57–1.00)
GFR calc Af Amer: 74 mL/min/{1.73_m2} (ref >59)
GFR calc non Af Amer: 64 mL/min/{1.73_m2} (ref >59)
Glucose: 101 mg/dL — ABNORMAL HIGH (ref 65–99)
Potassium: 4.5 mmol/L (ref 3.5–5.2)
Sodium: 141 mmol/L (ref 134–144)

## 2020-08-03 LAB — CBC
Hematocrit: 39.3 % (ref 34.0–46.6)
Hemoglobin: 13 g/dL (ref 11.1–15.9)
MCH: 26.9 pg (ref 26.6–33.0)
MCHC: 33.1 g/dL (ref 31.5–35.7)
MCV: 81 fL (ref 79–97)
Platelets: 466 10*3/uL — ABNORMAL HIGH (ref 150–450)
RBC: 4.83 x10E6/uL (ref 3.77–5.28)
RDW: 12.4 % (ref 11.7–15.4)
WBC: 13.8 10*3/uL — ABNORMAL HIGH (ref 3.4–10.8)

## 2020-08-05 ENCOUNTER — Encounter: Payer: Self-pay | Admitting: Pulmonary Disease

## 2020-08-05 ENCOUNTER — Ambulatory Visit: Payer: 59

## 2020-08-05 ENCOUNTER — Ambulatory Visit: Payer: 59 | Admitting: Pulmonary Disease

## 2020-08-05 ENCOUNTER — Other Ambulatory Visit: Payer: Self-pay

## 2020-08-05 ENCOUNTER — Inpatient Hospital Stay (HOSPITAL_BASED_OUTPATIENT_CLINIC_OR_DEPARTMENT_OTHER): Payer: 59 | Admitting: Hematology

## 2020-08-05 VITALS — BP 160/70 | HR 88 | Temp 97.8°F | Ht 70.0 in | Wt 303.0 lb

## 2020-08-05 VITALS — BP 161/77 | HR 88 | Temp 97.8°F | Resp 19 | Ht 70.0 in | Wt 305.8 lb

## 2020-08-05 DIAGNOSIS — G4733 Obstructive sleep apnea (adult) (pediatric): Secondary | ICD-10-CM | POA: Diagnosis not present

## 2020-08-05 DIAGNOSIS — D0512 Intraductal carcinoma in situ of left breast: Secondary | ICD-10-CM

## 2020-08-05 DIAGNOSIS — G47411 Narcolepsy with cataplexy: Secondary | ICD-10-CM

## 2020-08-05 DIAGNOSIS — G4730 Sleep apnea, unspecified: Secondary | ICD-10-CM

## 2020-08-05 DIAGNOSIS — G4721 Circadian rhythm sleep disorder, delayed sleep phase type: Secondary | ICD-10-CM | POA: Diagnosis not present

## 2020-08-05 DIAGNOSIS — E2839 Other primary ovarian failure: Secondary | ICD-10-CM

## 2020-08-05 DIAGNOSIS — G4710 Hypersomnia, unspecified: Secondary | ICD-10-CM

## 2020-08-05 DIAGNOSIS — C541 Malignant neoplasm of endometrium: Secondary | ICD-10-CM | POA: Diagnosis not present

## 2020-08-05 NOTE — Assessment & Plan Note (Signed)
Home sleep test showed only mild OSA, desaturations were not very significant.  Cardiovascular implications of this degree of OSA are minimal however given her history of hypersomnolence and fatigue, it is reasonable to undertake a trial of CPAP therapy.  We discussed alternative therapies including oral appliance but CPAP would be definitive in proof of concept. If she significantly improved with CPAP therapy we can consider this as long-term until she loses weight. If on the other hand she does not improve then we can proceed with evaluation for narcolepsy.  We will prescribe auto CPAP 5 to 12 cm with nasal interface due to her history of claustrophobia

## 2020-08-05 NOTE — Assessment & Plan Note (Signed)
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

## 2020-08-05 NOTE — Progress Notes (Signed)
Subjective:    Patient ID: Kelli Horn, female    DOB: 06-22-1956, 64 y.o.   MRN: [id number]  HPI  64 year old morbid obese woman referred for evaluation of obstructive sleep apnea and nocturnal hypoxia She is under my partner dr August Albino care for asthma/COPD.  PFTs have shown severe obstructive defect with mild diffusion defect.  She was started on Trelegy.  About 20 pack years before she quit in 2015   PMH -endometrial cancer, left sided breast cancer, stage I, ER/PR positive, peripheral vascular disease, coronary artery disease, possible enteric ischemia, COPD,  diabetes mellitus   She underwent robotic total abdominal hysterectomy which was uneventful.  Epworth sleepiness score is 3 She reports a diagnosis of narcolepsy but on further questioning it seems that the she was just told by neurologist that she may have narcolepsy, no definitive tests were performed.  She reports sleep attacks which have occurred and caused her to "fall out" she can hold these off for 10 to 15 minutes. She reports a delayed bedtime since early adulthood Bedtime is around 4 AM, sleep latency may be 15 minutes, reports 2-3 nocturnal awakenings, wakes up every 2-3 hours including nocturia and is out of bed around 10 AM but could be as late as 1 PM.  Around 4 PM she feels the irresistible urge to nap again and will sometimes nap for 2 hours until 6 PM. She has gained 25 pounds in the last 2 years There is no history suggestive of cataplexy, sleep paralysis or parasomnias    Significant tests/ events reviewed 03/2020 PFT: FVC 61%, FEV1 44%, FEV1/FVC 56, 8% change in FEV1 post. No TLC. DLC 70%.  05/2020 HST -AHI 7.7/hour, nadir saturation 84%, 11 minutes with saturation less than 88%   Past Medical History:  Diagnosis Date  . Acid reflux   . Anxiety   . Asthma   . Breast cancer ([place]) 05/2020   left breast DCIS  . Chronic kidney disease    Right side question infarction  . COPD (chronic  obstructive pulmonary disease) ([place])   . Diabetes mellitus without complication ([facility])    no meds  . Hyperlipidemia   . Hypertension   . PAD (peripheral artery disease) ([place])   . Peripheral vascular disease ([place])    severe athrosclerosis  . Sleep apnea     no cpap at this time. Sleep study done Jun 20 2020    Past Surgical History:  Procedure Laterality Date  . APPENDECTOMY    . BIOPSY  06/27/2020   Procedure: BIOPSY;  Surgeon: [name], MD;  Location: [facility];  Service: Endoscopy;;  . BREAST LUMPECTOMY WITH RADIOACTIVE SEED AND SENTINEL LYMPH NODE BIOPSY Bilateral 06/07/2020   Procedure: BILATERAL BREAST LUMPECTOMY WITH RADIOACTIVE SEED AND LEFT SENTINEL LYMPH NODE BIOPSY;  Surgeon: [name], MD;  Location: [facility];  Service: General;  Laterality: Bilateral;  PEC BLOCK  . COLONOSCOPY WITH PROPOFOL N/A 06/27/2020   Procedure: COLONOSCOPY WITH PROPOFOL;  Surgeon: [name], MD;  Location: [facility];  Service: Endoscopy;  Laterality: N/A;  . LEFT HEART CATH AND CORONARY ANGIOGRAPHY N/A 04/23/2020   Procedure: LEFT HEART CATH AND CORONARY ANGIOGRAPHY;  Surgeon: Martinique, Peter M, MD;  Location: [place] [facility];  Service: Cardiovascular;  Laterality: N/A;  . POLYPECTOMY  06/27/2020   Procedure: POLYPECTOMY;  Surgeon: [name], MD;  Location: [facility];  Service: Endoscopy;;  . ROBOTIC ASSISTED TOTAL HYSTERECTOMY WITH BILATERAL SALPINGO OOPHERECTOMY N/A 07/11/2020   Procedure:  XI ROBOTIC ASSISTED TOTAL HYSTERECTOMY WITH BILATERAL SALPINGO OOPHORECTOMY;  Surgeon: [name], MD;  Location: [facility];  Service: Gynecology;  Laterality: N/A;    Allergies  Allergen Reactions  . Beta Adrenergic Blockers Other (See Comments)    Trigger Asthma  . Carvedilol Shortness Of Breath    Reacts with albuterol, worsens asthma symptoms when taken together   . Peanut-Containing Drug Products Other (See Comments)     Trigger Asthma attack     Social History   Socioeconomic History  . Marital status: Married    Spouse name: Not on file  . Number of children: 2  . Years of education: Not on file  . Highest education level: Not on file  Occupational History  . Occupation: retired   Tobacco Use  . Smoking status: Former Smoker    Packs/day: 2.00    Years: 10.00    Pack years: 20.00    Types: Cigarettes    Quit date: 04/16/2004    Years since quitting: 16.3  . Smokeless tobacco: Never Used  Vaping Use  . Vaping Use: Never used  Substance and Sexual Activity  . Alcohol use: No  . Drug use: No  . Sexual activity: Not Currently  Other Topics Concern  . Not on file  Social History Narrative  . Not on file   Social Determinants of Health   Financial Resource Strain:   . Difficulty of Paying Living Expenses: Not on file  Food Insecurity:   . Worried About Charity fundraiser in the Last Year: Not on file  . Ran Out of Food in the Last Year: Not on file  Transportation Needs:   . Lack of Transportation (Medical): Not on file  . Lack of Transportation (Non-Medical): Not on file  Physical Activity:   . Days of Exercise per Week: Not on file  . Minutes of Exercise per Session: Not on file  Stress:   . Feeling of Stress : Not on file  Social Connections:   . Frequency of Communication with Friends and Family: Not on file  . Frequency of Social Gatherings with Friends and Family: Not on file  . Attends Religious Services: Not on file  . Active Member of Clubs or Organizations: Not on file  . Attends Archivist Meetings: Not on file  . Marital Status: Not on file  Intimate Partner Violence:   . Fear of Current or Ex-Partner: Not on file  . Emotionally Abused: Not on file  . Physically Abused: Not on file  . Sexually Abused: Not on file    Family History  Problem Relation Age of Onset  . Heart attack Father 46  . Aneurysm Father   . Breast cancer Maternal Aunt   . Lung cancer Mother   . Diabetes  Other   . Heart disease Paternal Uncle   . Aneurysm Paternal Uncle      Review of Systems Shortness of breath with activity Acid heartburn Abdominal pain Difficulty swallowing Tooth problems Itching Returning Joint stiffness    Objective:   Physical Exam  Gen. Pleasant, obese, in no distress, normal affect ENT - no pallor,icterus, no post nasal drip, class 2-3 airway Neck: No JVD, no thyromegaly, no carotid bruits Lungs: no use of accessory muscles, no dullness to percussion, decreased without rales or rhonchi  Cardiovascular: Rhythm regular, heart sounds  normal, no murmurs or gallops, no peripheral edema Abdomen: soft and non-tender, no hepatosplenomegaly, BS normal. Musculoskeletal: No deformities, no cyanosis or clubbing  Neuro:  alert, non focal, no tremors       Assessment & Plan:

## 2020-08-05 NOTE — Patient Instructions (Signed)
  Place order to DME for auto CPAP 5 to 12 cm with nasal pillows/nasal mask of choice We will do this as a trial to see how much better you feel with CPAP therapy

## 2020-08-05 NOTE — Assessment & Plan Note (Signed)
This does not appear to be a definitive diagnosis and she was only verbally told by the neurologist that she "could have narcolepsy".  She never underwent MSLT for detailed sleep testing in the past. Diagnosis would involve overnight NPSG followed by MSLT however since he has delayed sleep phase and sleeps on a routine day up to 12 noon, it would be difficult to perform MSLT.  I would hold off on further investigations for this at this point at any rate she does not seem to have cataplexy

## 2020-08-06 ENCOUNTER — Ambulatory Visit: Payer: 59

## 2020-08-06 ENCOUNTER — Encounter: Payer: Self-pay | Admitting: Hematology

## 2020-08-06 ENCOUNTER — Other Ambulatory Visit (HOSPITAL_COMMUNITY)
Admission: RE | Admit: 2020-08-06 | Discharge: 2020-08-06 | Disposition: A | Discharge status: Home / Self Care | Payer: 59 | Source: Ambulatory Visit | Attending: Cardiovascular Disease | Admitting: Cardiovascular Disease

## 2020-08-06 DIAGNOSIS — Z20822 Contact with and (suspected) exposure to covid-19: Secondary | ICD-10-CM | POA: Diagnosis not present

## 2020-08-06 DIAGNOSIS — Z01812 Encounter for preprocedural laboratory examination: Secondary | ICD-10-CM | POA: Insufficient documentation

## 2020-08-06 LAB — SARS CORONAVIRUS 2 (TAT 6-24 HRS): SARS Coronavirus 2: NEGATIVE

## 2020-08-07 ENCOUNTER — Encounter: Payer: Self-pay | Admitting: Pulmonary Disease

## 2020-08-07 ENCOUNTER — Ambulatory Visit: Payer: 59

## 2020-08-07 ENCOUNTER — Telehealth: Payer: Self-pay | Admitting: Hematology

## 2020-08-07 ENCOUNTER — Telehealth: Payer: Self-pay | Admitting: *Deleted

## 2020-08-07 ENCOUNTER — Other Ambulatory Visit: Payer: Self-pay

## 2020-08-07 ENCOUNTER — Ambulatory Visit (INDEPENDENT_AMBULATORY_CARE_PROVIDER_SITE_OTHER): Payer: 59 | Admitting: Pulmonary Disease

## 2020-08-07 VITALS — BP 130/80 | HR 84 | Temp 97.4°F | Ht 70.0 in | Wt 303.6 lb

## 2020-08-07 DIAGNOSIS — G4733 Obstructive sleep apnea (adult) (pediatric): Secondary | ICD-10-CM

## 2020-08-07 DIAGNOSIS — J4550 Severe persistent asthma, uncomplicated: Secondary | ICD-10-CM

## 2020-08-07 DIAGNOSIS — J449 Chronic obstructive pulmonary disease, unspecified: Secondary | ICD-10-CM

## 2020-08-07 MED ORDER — FLOVENT HFA 110 MCG/ACT IN AERO
2.0000 | INHALATION_SPRAY | Freq: Two times a day (BID) | RESPIRATORY_TRACT | 12 refills | Status: DC | PRN
Start: 2020-08-07 — End: 2020-11-05

## 2020-08-07 MED ORDER — PREDNISONE 10 MG (21) PO TBPK: ORAL_TABLET | Freq: Every day | ORAL | 0 refills | Status: DC

## 2020-08-07 NOTE — Telephone Encounter (Signed)
Scheduled appt per 12/7 sch msg - pt is aware of apt date and time

## 2020-08-07 NOTE — Telephone Encounter (Signed)
Pt contacted pre-abdominal aortogram scheduled at [facility] for: Thursday August 08, 2020 9:30 AM Verified arrival time and place: [facility]) at: 7:30 AM   No solid food after midnight prior to cath, clear liquids until 5 AM day of procedure.   AM meds can be  taken pre-cath with sips of water including: ASA 81 mg   Confirmed patient has responsible adult to drive home post procedure and be with patient first 24 hours after arriving home: yes  You are allowed ONE visitor in the waiting room during the time you are at the hospital for your procedure. Both you and your visitor must wear a mask once you enter the hospital.       COVID-19 Pre-Screening Questions:  . In the past 14 days have you had any symptoms concerning for COVID-19 infection (fever, chills, cough, or new shortness of breath)? no . In the past 14 days have you been around anyone with known Covid 19?  No   Reviewed procedure/mask/visitor instructions, COVID-19 questions with patient.

## 2020-08-07 NOTE — Progress Notes (Signed)
Subjective:   PATIENT ID: Kelli Horn GENDER: female DOB: Mar 13, 1956, MRN: [id number]   HPI  Chief Complaint  Patient presents with  . Follow-up    Asthma, OSA, SOB with DOE   Kelli Horn is a 64 year old woman, former smoker with asthma, diabetes mellitus type II, hypertension, hyperlipidemia, GERD and recent diagnosis of breast cancer and endometrial cancer who returns to pulmonary clinic for follow up of asthma.  She was started on Trelegy Ellipta inhaler therapy at last visit on 05/17/20 and reports decreased use of her albuterol inhaler on a daily basis but continues to have episodes of shortness of breath, chest tightness and wheezing that do not seem to respond to as needed albuterol or albuterol nebulizer treatments. She admits that some of these episodes are brought on by stress or anxiety, but other times they are triggered by smoke exposure and also by the pellet stove in her home. She has tolerated the stove previously with use of her albuterol inhaler which does not seem to improve her symptoms this year. The stove is their main source of heat in the winter.   She had a home sleep study performed and has been diagnosed with mild obstructive sleep apnea. She has been ordered for a CPAP machine which she is currently waiting on being delivered.   Past Medical History:  Diagnosis Date  . Acid reflux   . Anxiety   . Asthma   . 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 Chronic kidney disease    Right side question infarction  . COPD (chronic obstructive pulmonary disease) ([place])   . Diabetes mellitus without complication (HCC)    no meds  . Hyperlipidemia   . Hypertension   . PAD (peripheral artery disease) ([place])   . Peripheral vascular disease ([place])    severe athrosclerosis  . Sleep apnea     no cpap at this time. Sleep study done Jun 20 2020     Family History  Problem Relation Age of Onset  . Heart attack Father 53  . Aneurysm Father   .  Breast cancer Maternal Aunt   . Lung cancer Mother   . Diabetes Other   . Heart disease Paternal Uncle   . Aneurysm Paternal Uncle      Social History   Socioeconomic History  . Marital status: Married    Spouse name: Not on file  . Number of children: 2  . Years of education: Not on file  . Highest education level: Not on file  Occupational History  . Occupation: retired   Tobacco Use  . Smoking status: Former Smoker    Packs/day: 2.00    Years: 10.00    Pack years: 20.00    Types: Cigarettes    Quit date: 04/16/2004    Years since quitting: 16.3  . Smokeless tobacco: Never Used  Vaping Use  . Vaping Use: Never used  Substance and Sexual Activity  . Alcohol use: No  . Drug use: No  . Sexual activity: Not Currently  Other Topics Concern  . Not on file  Social History Narrative  . Not on file   Social Determinants of Health   Financial Resource Strain:   . Difficulty of Paying Living Expenses: Not on file  Food Insecurity:   . Worried About Charity fundraiser in the Last Year: Not on file  . Ran Out of Food in the Last Year: Not on file  Transportation Needs:   . Film/video editor (Medical): Not on file  . Lack of Transportation (Non-Medical): Not on file  Physical Activity:   . Days of Exercise per Week: Not on file  . Minutes of Exercise per Session: Not on file  Stress:   . Feeling of Stress : Not on file  Social Connections:   . Frequency of Communication with Friends and Family: Not on file  . Frequency of Social Gatherings with Friends and Family: Not on file  . Attends Religious Services: Not on file  . Active Member of Clubs or Organizations: Not on file  . Attends Archivist Meetings: Not on file  . Marital Status: Not on file  Intimate Partner Violence:   . Fear of Current or Ex-Partner: Not on file  . Emotionally Abused: Not on file  . Physically Abused: Not on file  . Sexually Abused: Not on file     Allergies  Allergen  Reactions  . Beta Adrenergic Blockers Other (See Comments)    Trigger Asthma  . Carvedilol Shortness Of Breath    Reacts with albuterol, worsens asthma symptoms when taken together   . Peanut-Containing Drug Products Other (See Comments)     Trigger Asthma attack     Outpatient Medications Prior to Visit  Medication Sig Dispense Refill  . albuterol (PROVENTIL HFA;VENTOLIN HFA) 108 (90 Base) MCG/ACT inhaler Inhale 2 puffs into the lungs every 6 (six) hours as needed for wheezing or shortness of breath.     Marland Kitchen albuterol (PROVENTIL) (2.5 MG/3ML) 0.083% nebulizer solution Take 2.5 mg by nebulization every 6 (six) hours as needed for shortness of breath.    Marland Kitchen amLODipine (NORVASC) 5 MG tablet Take 5 mg by mouth daily.  180 tablet 3  . aspirin EC 81 MG tablet Take 81 mg by mouth daily. Swallow whole.    . b complex vitamins capsule Take 1 capsule by mouth daily.    . enalapril (VASOTEC) 10 MG tablet Take 15 mg by mouth 2 (two) times daily.     . Fluticasone-Umeclidin-Vilant (TRELEGY ELLIPTA) 100-62.5-25 MCG/INH AEPB Inhale 1 puff into the lungs daily. 1 each 6  . methocarbamol (ROBAXIN) 750 MG tablet Take 750 mg by mouth 2 (two) times daily as needed for muscle spasms.     Vladimir Faster Glycol-Propyl Glycol (SYSTANE ULTRA) 0.4-0.3 % SOLN Place 1 drop into both eyes 3 (three) times daily as needed (dry eyes).     . rosuvastatin (CRESTOR) 5 MG tablet Take 5 mg twice a week (Patient taking differently: Take 5 mg by mouth every Saturday. ) 30 tablet 6   Facility-Administered Medications Prior to Visit  Medication Dose Route Frequency Provider Last Rate Last Admin  . sodium chloride flush (NS) 0.9 % injection 3 mL  3 mL Intravenous Q12H [name], MD        Review of Systems  Constitutional: Negative for chills, diaphoresis, fever, malaise/fatigue and weight loss.  HENT: Negative for sinus pain and sore throat.   Respiratory: Positive for shortness of breath and wheezing. Negative for cough,  hemoptysis and sputum production.   Cardiovascular: Negative for chest pain, palpitations, orthopnea, claudication and leg swelling.  Gastrointestinal: Negative for abdominal pain, heartburn, nausea and vomiting.  Genitourinary: Negative.   Musculoskeletal: Negative.   Neurological: Negative for dizziness, weakness and headaches.  Psychiatric/Behavioral: Negative.     Objective:   Vitals:   08/07/20 1150  BP: 130/80  Pulse: 84  Temp: (!) 97.4 F (36.3  C)  TempSrc: Oral  SpO2: 96%  Weight: (!) 303 lb 9.6 oz (137.7 kg)  Height: 5\' 10"  (1.778 m)     Physical Exam Constitutional:      General: She is not in acute distress.    Appearance: She is obese. She is not ill-appearing.  Eyes:     General: No scleral icterus.    Conjunctiva/sclera: Conjunctivae normal.     Pupils: Pupils are equal, round, and reactive to light.  Cardiovascular:     Rate and Rhythm: Normal rate and regular rhythm.     Pulses: Normal pulses.     Heart sounds: Normal heart sounds.  Pulmonary:     Effort: Pulmonary effort is normal.     Breath sounds: Normal breath sounds. No wheezing, rhonchi or rales.  Abdominal:     General: Bowel sounds are normal.     Palpations: Abdomen is soft.  Musculoskeletal:     Right lower leg: No edema.     Left lower leg: No edema.  Skin:    General: Skin is warm and dry.     Capillary Refill: Capillary refill takes less than 2 seconds.  Neurological:     General: No focal deficit present.     Mental Status: She is alert.  Psychiatric:        Mood and Affect: Mood normal.        Behavior: Behavior normal.        Thought Content: Thought content normal.        Judgment: Judgment normal.     CBC    Component Value Date/Time   WBC 13.8 (H) 08/02/2020 1635   WBC 9.5 07/05/2020 1222   RBC 4.83 08/02/2020 1635   RBC 4.89 07/05/2020 1222   HGB 13.0 08/02/2020 1635   HCT 39.3 08/02/2020 1635   PLT 466 (H) 08/02/2020 1635   MCV 81 08/02/2020 1635   MCH 26.9  08/02/2020 1635   MCH 27.2 07/05/2020 1222   MCHC 33.1 08/02/2020 1635   MCHC 32.0 07/05/2020 1222   RDW 12.4 08/02/2020 1635   LYMPHSABS 2.2 05/22/2020 1206   LYMPHSABS 2.7 04/19/2020 1144   MONOABS 1.0 05/22/2020 1206   EOSABS 0.3 05/22/2020 1206   EOSABS 0.4 04/19/2020 1144   BASOSABS 0.1 05/22/2020 1206   BASOSABS 0.1 04/19/2020 1144   BMP Latest Ref Rng & Units 08/02/2020 07/05/2020 05/22/2020  Glucose 65 - 99 mg/dL 101(H) 117(H) 115(H)  BUN 8 - 27 mg/dL 14 16 12   Creatinine 0.57 - 1.00 mg/dL 0.95 0.80 0.82  BUN/Creat Ratio 12 - 28 15 - -  Sodium 134 - 144 mmol/L 141 140 137  Potassium 3.5 - 5.2 mmol/L 4.5 4.1 4.2  Chloride 96 - 106 mmol/L 102 104 103  CO2 20 - 29 mmol/L 24 25 28   Calcium 8.7 - 10.3 mg/dL 9.4 9.1 9.5     Chest imaging: CXR 01/2020: no acute abnormalities noted. PFT: PFT Results Latest Ref Rng & Units 04/24/2020  FVC-Pre L 2.53  FVC-Predicted Pre % 63  FVC-Post L 2.47  FVC-Predicted Post % 61  Pre FEV1/FVC % % 50  Post FEV1/FCV % % 56  FEV1-Pre L 1.27  FEV1-Predicted Pre % 41  FEV1-Post L 1.37  DLCO uncorrected ml/min/mmHg 17.23  DLCO [facility]% % 70  DLVA Predicted % 101   Echo: 05/13/20 EF 55-60%, LV has normal systolic function. RV systolic function is normal. Left atrial and right atrial size is mildly dilated.   Heart Catheterization: 04/23/20  1. Nonobstructive CAD 2. Normal LV function 3. Mildly elevated LVEDP 4. Hypertensive 5. No significant right innominate or subclavian pressure gradient.      Assessment & Plan:   Asthma with irreversible airway obstruction, severe persistent, uncomplicated (HCC)  OSA (obstructive sleep apnea)  Discussion: Alyn Riedinger is a 64 year old woman, former smoker with asthma, diabetes mellitus type II, hypertension, hyperlipidemia, GERD and recent diagnosis of breast cancer and endometrial cancer who returns to pulmonary clinic for follow up of asthma.  She has severe obstructive defect and a mild diffusion  defect on recent PFTs. She has severe persistent asthma with fixed airway obstruction. She is to continue onTrelegy Ellipta 1 puff daily and albuterol inhaler as needed. We will send in a prescription for flovent 171mcg to be used as needed for when she has episodes of shortness of breath that do not respond to albuterol. We will also provide her with a prescription of prednisone to have on hand in case of an emergency. She is to call the clinic if she starts the prednisone for her breathing.   She has CPAP ordered for her obstructive sleep apnea and is being followed by Dr. [name].   Follow up in 3 months  Freda Jackson, MD [facility] Office: [phone]   See Amion for Pager Details     Current Outpatient Medications:  .  albuterol (PROVENTIL HFA;VENTOLIN HFA) 108 (90 Base) MCG/ACT inhaler, Inhale 2 puffs into the lungs every 6 (six) hours as needed for wheezing or shortness of breath. , Disp: , Rfl:  .  albuterol (PROVENTIL) (2.5 MG/3ML) 0.083% nebulizer solution, Take 2.5 mg by nebulization every 6 (six) hours as needed for shortness of breath., Disp: , Rfl:  .  amLODipine (NORVASC) 5 MG tablet, Take 5 mg by mouth daily. , Disp: 180 tablet, Rfl: 3 .  aspirin EC 81 MG tablet, Take 81 mg by mouth daily. Swallow whole., Disp: , Rfl:  .  b complex vitamins capsule, Take 1 capsule by mouth daily., Disp: , Rfl:  .  enalapril (VASOTEC) 10 MG tablet, Take 15 mg by mouth 2 (two) times daily. , Disp: , Rfl:  .  Fluticasone-Umeclidin-Vilant (TRELEGY ELLIPTA) 100-62.5-25 MCG/INH AEPB, Inhale 1 puff into the lungs daily., Disp: 1 each, Rfl: 6 .  methocarbamol (ROBAXIN) 750 MG tablet, Take 750 mg by mouth 2 (two) times daily as needed for muscle spasms. , Disp: , Rfl:  .  Polyethyl Glycol-Propyl Glycol (SYSTANE ULTRA) 0.4-0.3 % SOLN, Place 1 drop into both eyes 3 (three) times daily as needed (dry eyes). , Disp: , Rfl:  .  rosuvastatin (CRESTOR) 5 MG tablet, Take 5 mg twice a  week (Patient taking differently: Take 5 mg by mouth every Saturday. ), Disp: 30 tablet, Rfl: 6 .  fluticasone (FLOVENT HFA) 110 MCG/ACT inhaler, Inhale 2 puffs into the lungs 2 (two) times daily as needed., Disp: 1 each, Rfl: 12 .  predniSONE (STERAPRED UNI-PAK 21 TAB) 10 MG (21) TBPK tablet, Take by mouth daily., Disp: 14 tablet, Rfl: 0  Current Facility-Administered Medications:  .  sodium chloride flush (NS) 0.9 % injection 3 mL, 3 mL, Intravenous, Q12H, [name], MD

## 2020-08-07 NOTE — Telephone Encounter (Signed)
No 12/6 los. No changes made to pt's schedule.

## 2020-08-07 NOTE — Patient Instructions (Signed)
Use flovent 137mcg 1-2 puffs as needed twice daily for increasing shortness of breath after using as needed albuterol inhaler or nebs   Continue trelegy inhaler daily

## 2020-08-08 ENCOUNTER — Ambulatory Visit (HOSPITAL_COMMUNITY)
Admission: RE | Admit: 2020-08-08 | Discharge: 2020-08-08 | Disposition: A | Discharge status: Home / Self Care | Payer: 59 | Attending: Cardiovascular Disease | Admitting: Cardiovascular Disease

## 2020-08-08 ENCOUNTER — Ambulatory Visit: Payer: 59

## 2020-08-08 ENCOUNTER — Encounter (HOSPITAL_COMMUNITY)
Admission: RE | Disposition: A | Discharge status: Home / Self Care | Payer: 59 | Source: Home / Self Care | Attending: Cardiovascular Disease

## 2020-08-08 DIAGNOSIS — E785 Hyperlipidemia, unspecified: Secondary | ICD-10-CM | POA: Diagnosis not present

## 2020-08-08 DIAGNOSIS — E1151 Type 2 diabetes mellitus with diabetic peripheral angiopathy without gangrene: Secondary | ICD-10-CM | POA: Insufficient documentation

## 2020-08-08 DIAGNOSIS — Z01812 Encounter for preprocedural laboratory examination: Secondary | ICD-10-CM

## 2020-08-08 DIAGNOSIS — Z79899 Other long term (current) drug therapy: Secondary | ICD-10-CM | POA: Insufficient documentation

## 2020-08-08 DIAGNOSIS — I70213 Atherosclerosis of native arteries of extremities with intermittent claudication, bilateral legs: Secondary | ICD-10-CM | POA: Insufficient documentation

## 2020-08-08 DIAGNOSIS — I739 Peripheral vascular disease, unspecified: Secondary | ICD-10-CM | POA: Diagnosis present

## 2020-08-08 DIAGNOSIS — Z87891 Personal history of nicotine dependence: Secondary | ICD-10-CM | POA: Diagnosis not present

## 2020-08-08 DIAGNOSIS — I10 Essential (primary) hypertension: Secondary | ICD-10-CM | POA: Insufficient documentation

## 2020-08-08 HISTORY — PX: ABDOMINAL AORTOGRAM W/LOWER EXTREMITY: CATH118223

## 2020-08-08 LAB — GLUCOSE, CAPILLARY
Glucose-Capillary: 120 mg/dL — ABNORMAL HIGH (ref 70–99)
Glucose-Capillary: 100 mg/dL — ABNORMAL HIGH (ref 70–99)

## 2020-08-08 SURGERY — ABDOMINAL AORTOGRAM W/LOWER EXTREMITY
Anesthesia: LOCAL | Laterality: Bilateral

## 2020-08-08 MED ORDER — FENTANYL CITRATE (PF) 100 MCG/2ML IJ SOLN
INTRAMUSCULAR | Status: AC
  Filled 2020-08-08: qty 2

## 2020-08-08 MED ORDER — SODIUM CHLORIDE 0.9 % WEIGHT BASED INFUSION: 1.0000 mL/kg/h | INTRAVENOUS | Status: DC

## 2020-08-08 MED ORDER — ACETAMINOPHEN 325 MG PO TABS: 650.0000 mg | ORAL_TABLET | ORAL | Status: DC | PRN

## 2020-08-08 MED ORDER — SODIUM CHLORIDE 0.9 % IV SOLN: INTRAVENOUS | Status: AC

## 2020-08-08 MED ORDER — ASPIRIN EC 81 MG PO TBEC: 81.0000 mg | DELAYED_RELEASE_TABLET | Freq: Every day | ORAL | Status: DC

## 2020-08-08 MED ORDER — HEPARIN (PORCINE) IN NACL 1000-0.9 UT/500ML-% IV SOLN
INTRAVENOUS | Status: AC
  Filled 2020-08-08: qty 1000

## 2020-08-08 MED ORDER — MIDAZOLAM HCL 2 MG/2ML IJ SOLN
INTRAMUSCULAR | Status: AC
  Filled 2020-08-08: qty 2

## 2020-08-08 MED ORDER — SODIUM CHLORIDE 0.9 % IV SOLN: 250.0000 mL | INTRAVENOUS | Status: DC | PRN

## 2020-08-08 MED ORDER — SODIUM CHLORIDE 0.9% FLUSH: 3.0000 mL | INTRAVENOUS | Status: DC | PRN

## 2020-08-08 MED ORDER — ONDANSETRON HCL 4 MG/2ML IJ SOLN: 4.0000 mg | Freq: Four times a day (QID) | INTRAMUSCULAR | Status: DC | PRN

## 2020-08-08 MED ORDER — SODIUM CHLORIDE 0.9 % WEIGHT BASED INFUSION
3.0000 mL/kg/h | INTRAVENOUS | Status: AC
  Administered 2020-08-08: 3 mL/kg/h via INTRAVENOUS

## 2020-08-08 MED ORDER — ASPIRIN 81 MG PO CHEW: 81.0000 mg | CHEWABLE_TABLET | ORAL | Status: DC

## 2020-08-08 MED ORDER — MORPHINE SULFATE (PF) 2 MG/ML IV SOLN: 2.0000 mg | INTRAVENOUS | Status: DC | PRN

## 2020-08-08 MED ORDER — LIDOCAINE HCL (PF) 1 % IJ SOLN
INTRAMUSCULAR | Status: AC
  Filled 2020-08-08: qty 30

## 2020-08-08 MED ORDER — HEPARIN (PORCINE) IN NACL 1000-0.9 UT/500ML-% IV SOLN
INTRAVENOUS | Status: DC | PRN
  Administered 2020-08-08 (×2): 500 mL

## 2020-08-08 MED ORDER — FENTANYL CITRATE (PF) 100 MCG/2ML IJ SOLN
INTRAMUSCULAR | Status: DC | PRN
  Administered 2020-08-08 (×2): 25 ug via INTRAVENOUS

## 2020-08-08 MED ORDER — MIDAZOLAM HCL 2 MG/2ML IJ SOLN
INTRAMUSCULAR | Status: DC | PRN
  Administered 2020-08-08 (×2): 1 mg via INTRAVENOUS

## 2020-08-08 MED ORDER — LIDOCAINE HCL (PF) 1 % IJ SOLN
INTRAMUSCULAR | Status: DC | PRN
  Administered 2020-08-08: 40 mL via INTRADERMAL

## 2020-08-08 MED ORDER — HYDRALAZINE HCL 20 MG/ML IJ SOLN: 5.0000 mg | INTRAMUSCULAR | Status: DC | PRN

## 2020-08-08 MED ORDER — IODIXANOL 320 MG/ML IV SOLN
INTRAVENOUS | Status: DC | PRN
  Administered 2020-08-08: 40 mL via INTRA_ARTERIAL

## 2020-08-08 MED ORDER — SODIUM CHLORIDE 0.9% FLUSH: 3.0000 mL | Freq: Two times a day (BID) | INTRAVENOUS | Status: DC

## 2020-08-08 MED ORDER — LABETALOL HCL 5 MG/ML IV SOLN: 10.0000 mg | INTRAVENOUS | Status: DC | PRN

## 2020-08-08 SURGICAL SUPPLY — 10 items
CATH ANGIO 5F PIGTAIL 65CM (CATHETERS) ×1 IMPLANT
CATH STRAIGHT 5FR 65CM (CATHETERS) ×1 IMPLANT
GUIDEWIRE ANGLED .035X150CM (WIRE) ×1 IMPLANT
KIT PV (KITS) ×2 IMPLANT
SHEATH PINNACLE 5F 10CM (SHEATH) ×2 IMPLANT
SHEATH PROBE COVER 6X72 (BAG) ×1 IMPLANT
SYR MEDRAD MARK 7 150ML (SYRINGE) ×2 IMPLANT
TRANSDUCER W/STOPCOCK (MISCELLANEOUS) ×2 IMPLANT
TRAY PV CATH (CUSTOM PROCEDURE TRAY) ×2 IMPLANT
WIRE HITORQ VERSACORE ST 145CM (WIRE) ×1 IMPLANT

## 2020-08-08 NOTE — Discharge Instructions (Signed)
Femoral Site Care This sheet gives you information about how to care for yourself after your procedure. Your health care provider may also give you more specific instructions. If you have problems or questions, contact your health care provider. What can I expect after the procedure? After the procedure, it is common to have:  Bruising that usually fades within 1-2 weeks.  Tenderness at the site. Follow these instructions at home: Wound care  Follow instructions from your health care provider about how to take care of your insertion site. Make sure you: ? Wash your hands with soap and water before you change your bandage (dressing). If soap and water are not available, use hand sanitizer. ? Change your dressing as told by your health care provider. ? Leave stitches (sutures), skin glue, or adhesive strips in place. These skin closures may need to stay in place for 2 weeks or longer. If adhesive strip edges start to loosen and curl up, you may trim the loose edges. Do not remove adhesive strips completely unless your health care provider tells you to do that.  Do not take baths, swim, or use a hot tub until your health care provider approves.  You may shower 24-48 hours after the procedure or as told by your health care provider. ? Gently wash the site with plain soap and water. ? Pat the area dry with a clean towel. ? Do not rub the site. This may cause bleeding.  Do not apply powder or lotion to the site. Keep the site clean and dry.  Check your femoral site every day for signs of infection. Check for: ? Redness, swelling, or pain. ? Fluid or blood. ? Warmth. ? Pus or a bad smell. Activity  For the first 2-3 days after your procedure, or as long as directed: ? Avoid climbing stairs as much as possible. ? Do not squat.  Do not lift anything that is heavier than 10 lb (4.5 kg), or the limit that you are told, until your health care provider says that it is safe.  Rest as  directed. ? Avoid sitting for a long time without moving. Get up to take short walks every 1-2 hours.  Do not drive for 24 hours if you were given a medicine to help you relax (sedative). General instructions  Take over-the-counter and prescription medicines only as told by your health care provider.  Keep all follow-up visits as told by your health care provider. This is important. Contact a health care provider if you have:  A fever or chills.  You have redness, swelling, or pain around your insertion site. Get help right away if:  The catheter insertion area swells very fast.  You pass out.  You suddenly start to sweat or your skin gets clammy.  The catheter insertion area is bleeding, and the bleeding does not stop when you hold steady pressure on the area.  The area near or just beyond the catheter insertion site becomes pale, cool, tingly, or numb. These symptoms may represent a serious problem that is an emergency. Do not wait to see if the symptoms will go away. Get medical help right away. Call your local emergency services (911 in the [place] Do not drive yourself to the hospital. Summary  After the procedure, it is common to have bruising that usually fades within 1-2 weeks.  Check your femoral site every day for signs of infection.  Do not lift anything that is heavier than 10 lb (4.5 kg), or the   limit that you are told, until your health care provider says that it is safe. This information is not intended to replace advice given to you by your health care provider. Make sure you discuss any questions you have with your health care provider. Document Revised: 08/30/2017 Document Reviewed: 08/30/2017 Elsevier Patient Education  2020 [employer] Inc[employer]  

## 2020-08-08 NOTE — Progress Notes (Signed)
Up and walked and tolerated well; bilat groins stable, no bleeding or hematoma 

## 2020-08-08 NOTE — Progress Notes (Signed)
Client states "feeling tied down and claustrophobic" and requests oxygen; Phil Dopp notified and oxygen 2l/min started

## 2020-08-08 NOTE — Progress Notes (Signed)
Site area: right and left groins Sheath size: 5 fr x 2 Site prior to removal: clean, dry, intact Pressure applied for: 20 min per site Manual?: yes Patient status during removal: stable Post removal site status: Level 0 Post instructions given? Yes Post removal pulses: Bilateral PTs dopplared Dressing type: gauze and Tegaderm Bedrest begins @: 11:00  Comments: Bilateral sheaths pulled by Efraim Kaufmann

## 2020-08-08 NOTE — Interval H&P Note (Signed)
History and Physical Interval Note:  08/08/2020 9:04 AM  Kelli Horn  has presented today for surgery, with the diagnosis of claudication.  The various methods of treatment have been discussed with the patient and family. After consideration of risks, benefits and other options for treatment, the patient has consented to  Procedure(s): ABDOMINAL AORTOGRAM W/LOWER EXTREMITY (Bilateral) as a surgical intervention.  The patient's history has been reviewed, patient examined, no change in status, stable for surgery.  I have reviewed the patient's chart and labs.  Questions were answered to the patient's satisfaction.     Quay Burow

## 2020-08-09 ENCOUNTER — Telehealth: Payer: Self-pay | Admitting: Cardiology

## 2020-08-09 ENCOUNTER — Encounter (HOSPITAL_COMMUNITY): Payer: Self-pay | Admitting: Cardiovascular Disease

## 2020-08-09 ENCOUNTER — Ambulatory Visit: Payer: 59

## 2020-08-09 NOTE — Telephone Encounter (Signed)
Pt c/o medication issue:  1. Name of Medication: aspirin EC 81 MG tablet  2. How are you currently taking this medication (dosage and times per day)? Usually every morning  3. Are you having a reaction (difficulty breathing--STAT)? no  4. What is your medication issue? Patient states that Dr. Martinique has her taking baby aspirin every morning but since she had a procedure yesterday with Dr. [name] she wants to know if she should hold the aspirin today. Please advise.

## 2020-08-09 NOTE — Telephone Encounter (Signed)
Spoke to patient she stated after her leg procedure with Dr.Berry yesterday no one told her to restart Aspirin.Advised to take Aspirin 81 mg daily.Stated she increased Amlodipine to 5 mg daily.Advised I will send message to Dr.Jordan to make him aware.

## 2020-08-12 ENCOUNTER — Ambulatory Visit: Payer: 59

## 2020-08-12 ENCOUNTER — Telehealth: Payer: Self-pay | Admitting: Cardiology

## 2020-08-12 NOTE — Telephone Encounter (Signed)
Spoke to patient she stated on her discharge instructions it says she needs a abd ct and see Dr.Berry this week to make final recommendations.Stated she understood Dr.Berry to say he would be referring her to vascular surgeon.She called vascular surgeons and they have not received a order.Stated she had a abd ct already 04/05/20.She she wanted to know why she needed to have done again.She wants to be referred to vascular surgeons.Advised I will send message to Dr.Berry.

## 2020-08-12 NOTE — Telephone Encounter (Signed)
Patient wanted to speak to Kelli Horn about why her test for 08/15/20 was cancelled.   There were some other notes on the the patient's discharge summary she wanted to ask [name] about.

## 2020-08-13 ENCOUNTER — Other Ambulatory Visit: Payer: Self-pay

## 2020-08-13 ENCOUNTER — Encounter (HOSPITAL_COMMUNITY): Payer: Self-pay | Admitting: Cardiovascular Disease

## 2020-08-13 ENCOUNTER — Telehealth: Payer: Self-pay

## 2020-08-13 DIAGNOSIS — I739 Peripheral vascular disease, unspecified: Secondary | ICD-10-CM

## 2020-08-13 NOTE — Telephone Encounter (Signed)
Spoke with pt on the phone. Explained to her that orders were placed for a ct angio of abdomen/pelvis and ct angio of bilateral lower extremity have been placed. Explained to pt why this imaging I necessary for [facility]. Pt verbalizes understanding.

## 2020-08-14 ENCOUNTER — Ambulatory Visit: Payer: 59 | Admitting: Gynecologic Oncology

## 2020-08-14 NOTE — Telephone Encounter (Signed)
Spoke with patient regarding appointment for CTA abd/pelvis and bilateral lower extremities scheduled 08/29/20 at 2:30 pm at [place] [address]----Arrival time is  2:15 pm for check in----NPO 4 hours prior to study----will mail information to patient and it is also in My Chart.  Patient voiced her understanding

## 2020-08-15 ENCOUNTER — Encounter (HOSPITAL_COMMUNITY): Payer: 59

## 2020-08-20 ENCOUNTER — Ambulatory Visit (INDEPENDENT_AMBULATORY_CARE_PROVIDER_SITE_OTHER): Payer: 59 | Admitting: Vascular Surgery

## 2020-08-20 ENCOUNTER — Encounter: Payer: Self-pay | Admitting: Vascular Surgery

## 2020-08-20 ENCOUNTER — Other Ambulatory Visit: Payer: Self-pay

## 2020-08-20 VITALS — BP 186/114 | HR 79 | Temp 96.6°F | Resp 16 | Ht 70.0 in | Wt 305.0 lb

## 2020-08-20 DIAGNOSIS — I739 Peripheral vascular disease, unspecified: Secondary | ICD-10-CM | POA: Diagnosis not present

## 2020-08-20 DIAGNOSIS — I700 Atherosclerosis of aorta: Secondary | ICD-10-CM

## 2020-08-20 DIAGNOSIS — I708 Atherosclerosis of other arteries: Secondary | ICD-10-CM | POA: Insufficient documentation

## 2020-08-20 NOTE — Progress Notes (Signed)
Patient name: Kelli Horn MRN: [id number] DOB: July 12, 1956 Sex: female  REASON FOR CONSULT: Evaluate for aortobifemoral bypass in setting of lower extremity lifestyle limiting claudication  HPI: Kelli Horn is a 64 y.o. female, with history of hypertension, hyperlipidemia, COPD, morbid obesity, peripheral vascular disease that presents as a referral from Dr. [name] for evaluation of aortobifemoral bypass. Patient describes years of calf and buttock burning as well as burning in her calf when she walks consistent with claudication. She also has numbness in her feet. She has been under the care of Dr. 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  She was subsequently referred to vascular surgery. She states she has very minimal mobility and cannot walk more than 1 block. She states this is due to her legs hurting and also SOB.  She does have COPD but is not on home oxygen is followed by pulmonology here at [facility]. She recently had a robotic assisted hysterectomy and other surgeries include appendectomy and previous tubal ligation.  Her cardiologist is Dr. Martinique and previous had heart cath with no significant CAD.  Last echo from 05/13/20 showed EF 55-60% and LVEF normal function.  Past Medical History:  Diagnosis Date  . Acid reflux   . Anxiety   . Asthma   . Breast cancer ([place]) 05/2020   left breast DCIS  . Chronic kidney disease    Right side question infarction  . COPD (chronic obstructive pulmonary disease) ([place])   . Diabetes mellitus without complication (HCC)    no meds  . Hyperlipidemia   . Hypertension   . PAD (peripheral artery disease) ([place])   . Peripheral vascular disease ([place])    severe athrosclerosis  . Sleep apnea     no cpap at this time. Sleep study done Jun 20 2020    Past Surgical History:  Procedure Laterality Date  . ABDOMINAL  AORTOGRAM W/LOWER EXTREMITY Bilateral 08/08/2020   Procedure: ABDOMINAL AORTOGRAM W/LOWER EXTREMITY;  Surgeon: [name], MD;  Location: [place] [facility];  Service: Cardiovascular;  Laterality: Bilateral;  . APPENDECTOMY    . BIOPSY  06/27/2020   Procedure: BIOPSY;  Surgeon: [name], MD;  [facility];  Service: Endoscopy;;  . BREAST LUMPECTOMY WITH RADIOACTIVE SEED AND SENTINEL LYMPH NODE BIOPSY Bilateral 06/07/2020   Procedure: BILATERAL BREAST LUMPECTOMY WITH RADIOACTIVE SEED AND LEFT SENTINEL LYMPH NODE BIOPSY;  Surgeon: [name], MD;  Location: [place];  Service: General;  Laterality: Bilateral;  PEC BLOCK  . COLONOSCOPY WITH PROPOFOL N/A 06/27/2020   Procedure: COLONOSCOPY WITH PROPOFOL;  Surgeon: [name], MD;  [facility];  Service: Endoscopy;  Laterality: N/A;  . LEFT HEART CATH AND CORONARY ANGIOGRAPHY N/A 04/23/2020   Procedure: LEFT HEART CATH AND CORONARY ANGIOGRAPHY;  Surgeon: Martinique, Peter M, MD;  Location: [place] [facility];  Service: Cardiovascular;  Laterality: N/A;  . POLYPECTOMY  06/27/2020   Procedure: POLYPECTOMY;  Surgeon: [name], MD;  [facility];  Service: Endoscopy;;  . ROBOTIC ASSISTED TOTAL HYSTERECTOMY WITH BILATERAL SALPINGO OOPHERECTOMY N/A 07/11/2020   Procedure: XI ROBOTIC ASSISTED TOTAL HYSTERECTOMY WITH BILATERAL SALPINGO OOPHORECTOMY;  Surgeon: [name], MD;  [facility];  Service: Gynecology;  Laterality: N/A;    Family History  Problem Relation Age of Onset  . Heart attack Father 20  . Aneurysm Father   . Breast  cancer Maternal Aunt   . Lung cancer Mother   . Diabetes Other   . Heart disease Paternal Uncle   . Aneurysm Paternal Uncle     SOCIAL HISTORY: Social History   Socioeconomic History  . Marital status: Married    Spouse name: Not on file  . Number of children: 2  . Years of education: Not on file  . Highest education level: Not on file   Occupational History  . Occupation: retired   Tobacco Use  . Smoking status: Former Smoker    Packs/day: 2.00    Years: 10.00    Pack years: 20.00    Types: Cigarettes    Quit date: 04/16/2004    Years since quitting: 16.3  . Smokeless tobacco: Never Used  Vaping Use  . Vaping Use: Never used  Substance and Sexual Activity  . Alcohol use: No  . Drug use: No  . Sexual activity: Not Currently  Other Topics Concern  . Not on file  Social History Narrative  . Not on file   Social Determinants of Health   Financial Resource Strain: Not on file  Food Insecurity: Not on file  Transportation Needs: Not on file  Physical Activity: Not on file  Stress: Not on file  Social Connections: Not on file  Intimate Partner Violence: Not on file    Allergies  Allergen Reactions  . Beta Adrenergic Blockers Other (See Comments)    Trigger Asthma  . Carvedilol Shortness Of Breath    Reacts with albuterol, worsens asthma symptoms when taken together   . Peanut-Containing Drug Products Other (See Comments)     Trigger Asthma attack  . Iodinated Diagnostic Agents Other (See Comments)  . Peanut Butter Flavor Other (See Comments)  . Statins Other (See Comments)    Current Outpatient Medications  Medication Sig Dispense Refill  . albuterol (PROVENTIL HFA;VENTOLIN HFA) 108 (90 Base) MCG/ACT inhaler Inhale 2 puffs into the lungs every 6 (six) hours as needed for wheezing or shortness of breath.     Marland Kitchen albuterol (PROVENTIL) (2.5 MG/3ML) 0.083% nebulizer solution Take 2.5 mg by nebulization every 6 (six) hours as needed for shortness of breath.    Marland Kitchen amLODipine (NORVASC) 5 MG tablet Take 5 mg by mouth daily.  180 tablet 3  . aspirin EC 81 MG tablet Take 81 mg by mouth daily. Swallow whole.    . b complex vitamins capsule Take 1 capsule by mouth daily.    . enalapril (VASOTEC) 10 MG tablet Take 15 mg by mouth 2 (two) times daily.    . fluticasone (FLOVENT HFA) 110 MCG/ACT inhaler Inhale 2 puffs  into the lungs 2 (two) times daily as needed. 1 each 12  . Fluticasone-Umeclidin-Vilant (TRELEGY ELLIPTA) 100-62.5-25 MCG/INH AEPB Inhale 1 puff into the lungs daily. 1 each 6  . methocarbamol (ROBAXIN) 750 MG tablet Take 750 mg by mouth 2 (two) times daily as needed for muscle spasms.     Vladimir Faster Glycol-Propyl Glycol 0.4-0.3 % SOLN Place 1 drop into both eyes 3 (three) times daily as needed (dry eyes).     . rosuvastatin (CRESTOR) 5 MG tablet Take 5 mg twice a week (Patient taking differently: Take 5 mg by mouth every Saturday.) 30 tablet 6  . predniSONE (STERAPRED UNI-PAK 21 TAB) 10 MG (21) TBPK tablet Take by mouth daily. (Patient not taking: Reported on 08/20/2020) 14 tablet 0   Current Facility-Administered Medications  Medication Dose Route Frequency Provider Last Rate Last Admin  .  sodium chloride flush (NS) 0.9 % injection 3 mL  3 mL Intravenous Q12H [name], MD        REVIEW OF SYSTEMS:  [X]  denotes positive finding, [ ]  denotes negative finding Cardiac  Comments:  Chest pain or chest pressure:    Shortness of breath upon exertion: x   Short of breath when lying flat:    Irregular heart rhythm:        Vascular    Pain in calf, thigh, or hip brought on by ambulation: x   Pain in feet at night that wakes you up from your sleep:     Blood clot in your veins:    Leg swelling:  x       Pulmonary    Oxygen at home:    Productive cough:     Wheezing:  x       Neurologic    Sudden weakness in arms or legs:     Sudden numbness in arms or legs:     Sudden onset of difficulty speaking or slurred speech:    Temporary loss of vision in one eye:     Problems with dizziness:         Gastrointestinal    Blood in stool:     Vomited blood:         Genitourinary    Burning when urinating:     Blood in urine:        Psychiatric    Major depression:         Hematologic    Bleeding problems:    Problems with blood clotting too easily:        Skin    Rashes or  ulcers:        Constitutional    Fever or chills:      PHYSICAL EXAM: Vitals:   08/20/20 1229  BP: (!) 186/114  Pulse: 79  Resp: 16  Temp: (!) 96.6 F (35.9 C)  TempSrc: Temporal  SpO2: 96%  Weight: (!) 305 lb (138.3 kg)  Height: 5\' 10"  (1.778 m)    GENERAL: The patient is a well-nourished female, in no acute distress. The vital signs are documented above. CARDIAC: There is a regular rate and rhythm.  VASCULAR:  No palpable femoral pulses, large pannus No palpable pedal pulses No active tissue loss on lower extremities PULMONARY: No respiratory distress. ABDOMEN: Soft and non-tender.  Obese. MUSCULOSKELETAL: There are no major deformities or cyanosis. NEUROLOGIC: No focal weakness or paresthesias are detected. SKIN: There are no ulcers or rashes noted. PSYCHIATRIC: The patient has a normal affect.  DATA:   ABIs from 05/16/2020 are 0.58 on the right monophasic 0.59 on the left monophasic  Assessment/Plan:  64 year old female referred by Dr. [name] for evaluation of aortobifemoral bypass. She has bilateral lower extremity short distance lifestyle limiting claudication and he previously attempted bilateral transfemoral access then referred here for bilateral iliac occlusions. Ultimately her ABIs are in the 0.5 range bilaterally with monophasic waveforms at the ankles. 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 She has a CTA with runoff scheduled for 08/29/2020 and discussed that she needs to complete this image and follow-up with me afterwards to discuss best options moving forward. I had a very honest conversation with her that given her morbid obesity with a BMI of 44 she is at exceedingly high risk for complication after aortobifemoral bypass particularly with  issues involving groin infections and groin incisional breakdown. We discussed up to one third risk of major complication and average of 7 to 10 days in the  hospital after surgery. I will follow up with her after CTA is done for next steps. I discussed with her the importance of trying to start some walking regimen and trying to lose weight if she is really serious about having surgery.   Marty Heck, MD [facility] Office: [phone]

## 2020-08-22 ENCOUNTER — Ambulatory Visit: Payer: 59

## 2020-08-28 ENCOUNTER — Encounter: Payer: 59 | Admitting: Gynecologic Oncology

## 2020-08-29 ENCOUNTER — Other Ambulatory Visit: Payer: 59

## 2020-08-29 ENCOUNTER — Ambulatory Visit (INDEPENDENT_AMBULATORY_CARE_PROVIDER_SITE_OTHER)
Admission: RE | Admit: 2020-08-29 | Discharge: 2020-08-29 | Disposition: A | Discharge status: Home / Self Care | Payer: 59 | Source: Ambulatory Visit | Attending: Cardiovascular Disease | Admitting: Cardiovascular Disease

## 2020-08-29 ENCOUNTER — Other Ambulatory Visit: Payer: Self-pay

## 2020-08-29 DIAGNOSIS — I739 Peripheral vascular disease, unspecified: Secondary | ICD-10-CM

## 2020-08-29 MED ORDER — IOHEXOL 350 MG/ML SOLN
125.0000 mL | Freq: Once | INTRAVENOUS | Status: AC | PRN
  Administered 2020-08-29: 125 mL via INTRAVENOUS

## 2020-09-10 ENCOUNTER — Ambulatory Visit (INDEPENDENT_AMBULATORY_CARE_PROVIDER_SITE_OTHER): Payer: 59 | Admitting: Vascular Surgery

## 2020-09-10 ENCOUNTER — Encounter: Payer: Self-pay | Admitting: Vascular Surgery

## 2020-09-10 ENCOUNTER — Other Ambulatory Visit: Payer: Self-pay

## 2020-09-10 VITALS — BP 151/72 | HR 86 | Temp 97.3°F | Resp 18 | Ht 70.0 in | Wt 309.0 lb

## 2020-09-10 DIAGNOSIS — I700 Atherosclerosis of aorta: Secondary | ICD-10-CM

## 2020-09-10 NOTE — Progress Notes (Signed)
Patient name: Kelli Horn MRN: [id number] DOB: [birth year] Sex: female  REASON FOR CONSULT: F/U after CTA to discuss aortobifemoral bypass  HPI: Kelli Horn is a 65 y.o. female, with history of hypertension, hyperlipidemia, COPD, morbid obesity, peripheral vascular disease that presents as a referral from Dr. [name] for evaluation of aortobifemoral bypass. Patient describes years of calf and buttock burning as well as burning in her calf when she walks consistent with claudication.  She has been under the care of Dr. 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  She was subsequently referred to vascular surgery. She states she has very minimal mobility and cannot walk more than 1 block. She states this is due to her legs hurting and also SOB.  She does have COPD but is not on home oxygen is followed by pulmonology here at [facility]. She recently had a robotic assisted hysterectomy and other surgeries include appendectomy and previous tubal ligation.  Her cardiologist is Dr. Martinique and previous had heart cath with no significant CAD.  Last echo from 05/13/20 showed EF 55-60% and LVEF normal function.  After my initial evaluation, I was awaiting a CTA to further evaluate her options for revascularization. She presents today after CTA. Symptoms are about the same. Today her weight is 309 pounds. Her BMI is 44. States she can walk about 50 feet. No rest pain at night.  No tissue loss.  Past Medical History:  Diagnosis Date  . Acid reflux   . Anxiety   . Asthma   . 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 Chronic kidney disease    Right side question infarction  . COPD (chronic obstructive pulmonary disease) ([place])   . Diabetes mellitus without complication (HCC)    no meds  . Hyperlipidemia   . Hypertension   . PAD (peripheral artery disease) ([place])   .  Peripheral vascular disease ([place])    severe athrosclerosis  . Sleep apnea     no cpap at this time. Sleep study done Jun 20 2020    Past Surgical History:  Procedure Laterality Date  . ABDOMINAL AORTOGRAM W/LOWER EXTREMITY Bilateral 08/08/2020   Procedure: ABDOMINAL AORTOGRAM W/LOWER EXTREMITY;  Surgeon: [name], MD;  Location: [place] [facility];  Service: Cardiovascular;  Laterality: Bilateral;  . APPENDECTOMY    . BIOPSY  06/27/2020   Procedure: BIOPSY;  Surgeon: [name], MD;  [facility];  Service: Endoscopy;;  . BREAST LUMPECTOMY WITH RADIOACTIVE SEED AND SENTINEL LYMPH NODE BIOPSY Bilateral 06/07/2020   Procedure: BILATERAL BREAST LUMPECTOMY WITH RADIOACTIVE SEED AND LEFT SENTINEL LYMPH NODE BIOPSY;  Surgeon: [name], MD;  Location: [place];  Service: General;  Laterality: Bilateral;  PEC BLOCK  . COLONOSCOPY WITH PROPOFOL N/A 06/27/2020   Procedure: COLONOSCOPY WITH PROPOFOL;  Surgeon: [name], MD;  [facility];  Service: Endoscopy;  Laterality: N/A;  . LEFT HEART CATH AND CORONARY ANGIOGRAPHY N/A 04/23/2020   Procedure: LEFT HEART CATH AND CORONARY ANGIOGRAPHY;  Surgeon: Martinique, Peter M, MD;  Location: [place] [facility];  Service: Cardiovascular;  Laterality: N/A;  . POLYPECTOMY  06/27/2020   Procedure: POLYPECTOMY;  Surgeon: [name], MD;  [facility];  Service: Endoscopy;;  . ROBOTIC ASSISTED TOTAL HYSTERECTOMY WITH BILATERAL SALPINGO OOPHERECTOMY N/A 07/11/2020   Procedure: XI ROBOTIC ASSISTED TOTAL HYSTERECTOMY WITH BILATERAL  SALPINGO OOPHORECTOMY;  Surgeon: [name], MD;  [facility];  Service: Gynecology;  Laterality: N/A;    Family History  Problem Relation Age of Onset  . Heart attack Father 57  . Aneurysm Father   . Breast cancer Maternal Aunt   . Lung cancer Mother   . Diabetes Other   . Heart disease Paternal Uncle   . Aneurysm Paternal Uncle     SOCIAL  HISTORY: Social History   Socioeconomic History  . Marital status: Married    Spouse name: Not on file  . Number of children: 2  . Years of education: Not on file  . Highest education level: Not on file  Occupational History  . Occupation: retired   Tobacco Use  . Smoking status: Former Smoker    Packs/day: 2.00    Years: 10.00    Pack years: 20.00    Types: Cigarettes    Quit date: 04/16/2004    Years since quitting: 16.4  . Smokeless tobacco: Never Used  Vaping Use  . Vaping Use: Never used  Substance and Sexual Activity  . Alcohol use: No  . Drug use: No  . Sexual activity: Not Currently  Other Topics Concern  . Not on file  Social History Narrative  . Not on file   Social Determinants of Health   Financial Resource Strain: Not on file  Food Insecurity: Not on file  Transportation Needs: Not on file  Physical Activity: Not on file  Stress: Not on file  Social Connections: Not on file  Intimate Partner Violence: Not on file    Allergies  Allergen Reactions  . Beta Adrenergic Blockers Other (See Comments)    Trigger Asthma  . Carvedilol Shortness Of Breath    Reacts with albuterol, worsens asthma symptoms when taken together   . Peanut-Containing Drug Products Other (See Comments)     Trigger Asthma attack  . Peanut Butter Flavor Other (See Comments)  . Statins Other (See Comments)    Current Outpatient Medications  Medication Sig Dispense Refill  . albuterol (PROVENTIL HFA;VENTOLIN HFA) 108 (90 Base) MCG/ACT inhaler Inhale 2 puffs into the lungs every 6 (six) hours as needed for wheezing or shortness of breath.     Marland Kitchen albuterol (PROVENTIL) (2.5 MG/3ML) 0.083% nebulizer solution Take 2.5 mg by nebulization every 6 (six) hours as needed for shortness of breath.    Marland Kitchen amLODipine (NORVASC) 5 MG tablet Take 5 mg by mouth daily.  180 tablet 3  . aspirin EC 81 MG tablet Take 81 mg by mouth daily. Swallow whole.    . b complex vitamins capsule Take 1 capsule by  mouth daily.    . enalapril (VASOTEC) 10 MG tablet Take 15 mg by mouth 2 (two) times daily.    . fluticasone (FLOVENT HFA) 110 MCG/ACT inhaler Inhale 2 puffs into the lungs 2 (two) times daily as needed. 1 each 12  . Fluticasone-Umeclidin-Vilant (TRELEGY ELLIPTA) 100-62.5-25 MCG/INH AEPB Inhale 1 puff into the lungs daily. 1 each 6  . methocarbamol (ROBAXIN) 750 MG tablet Take 750 mg by mouth 2 (two) times daily as needed for muscle spasms.     Vladimir Faster Glycol-Propyl Glycol 0.4-0.3 % SOLN Place 1 drop into both eyes 3 (three) times daily as needed (dry eyes).     . predniSONE (STERAPRED UNI-PAK 21 TAB) 10 MG (21) TBPK tablet Take by mouth daily. 14 tablet 0  . rosuvastatin (CRESTOR) 5 MG tablet Take 5 mg twice a week (Patient taking  differently: Take 5 mg by mouth every Saturday.) 30 tablet 6   Current Facility-Administered Medications  Medication Dose Route Frequency Provider Last Rate Last Admin  . sodium chloride flush (NS) 0.9 % injection 3 mL  3 mL Intravenous Q12H [name], MD        REVIEW OF SYSTEMS:  [X]  denotes positive finding, [ ]  denotes negative finding Cardiac  Comments:  Chest pain or chest pressure:    Shortness of breath upon exertion:    Short of breath when lying flat:    Irregular heart rhythm:        Vascular    Pain in calf, thigh, or hip brought on by ambulation: x   Pain in feet at night that wakes you up from your sleep:     Blood clot in your veins:    Leg swelling:  x       Pulmonary    Oxygen at home:    Productive cough:     Wheezing:         Neurologic    Sudden weakness in arms or legs:     Sudden numbness in arms or legs:     Sudden onset of difficulty speaking or slurred speech:    Temporary loss of vision in one eye:     Problems with dizziness:         Gastrointestinal    Blood in stool:     Vomited blood:         Genitourinary    Burning when urinating:     Blood in urine:        Psychiatric    Major depression:          Hematologic    Bleeding problems:    Problems with blood clotting too easily:        Skin    Rashes or ulcers:        Constitutional    Fever or chills:      PHYSICAL EXAM: Vitals:   09/10/20 1621  BP: (!) 151/72  Pulse: 86  Resp: 18  Temp: (!) 97.3 F (36.3 C)  TempSrc: Temporal  SpO2: 94%  Weight: (!) 309 lb (140.2 kg)  Height: 5\' 10"  (1.778 m)    GENERAL: The patient is a well-nourished female, in no acute distress. The vital signs are documented above. CARDIAC: There is a regular rate and rhythm.  VASCULAR:  No palpable femoral pulses, large pannus No palpable pedal pulses No active tissue loss on lower extremities PULMONARY: No respiratory distress. ABDOMEN: Soft and non-tender.  Obese. MUSCULOSKELETAL: There are no major deformities or cyanosis. NEUROLOGIC: No focal weakness or paresthesias are detected. SKIN: There are no ulcers or rashes noted. PSYCHIATRIC: The patient has a normal affect.  DATA:   ABIs from 05/16/2020 are 0.58 on the right monophasic 0.59 on the left monophasic  CTA on my review shows occluded distal infrarenal aorta with occluded common iliacs bilaterally.  Assessment/Plan:  65 year old female referred by Dr. [name] for evaluation of aortobifemoral bypass. I reviewed her CTA scan with her and her mother-in-law today and discussed that after review of the CT I do not think she has any endovascular options. I do think she would benefit from aortobifemoral bypass as her best option for revascularization. This is complicated by her morbid obesity with a BMI of 44 and a fairly large pannus that I think puts her at exceedingly high risk for wound complications aside from the normal postoperative complications  that can be associated with open aortic surgery. I discussed 1-2 night stay in the ICU and about 7 to 10 days in the hospital with about a one third risk of major complication. Ultimately we agreed that we would delay surgical intervention for  now and she is going to see if she can try and lose weight and states she has had her weight down to 280 pounds in the last year. I will see her in 3 months. She has no critical limb ischemia symptoms at this time and discussed that if her symptoms progress to rest pain or tissue loss that would warrant a much more urgent intervention. She will call if she is worse in the interim.  I will re-check ABI's in 3 months.   Marty Heck, MD [facility] Office: [phone]

## 2020-09-11 ENCOUNTER — Other Ambulatory Visit: Payer: Self-pay

## 2020-09-11 DIAGNOSIS — I700 Atherosclerosis of aorta: Secondary | ICD-10-CM

## 2020-09-11 DIAGNOSIS — I739 Peripheral vascular disease, unspecified: Secondary | ICD-10-CM

## 2020-09-20 ENCOUNTER — Ambulatory Visit: Payer: 59 | Admitting: Cardiovascular Disease

## 2020-09-23 ENCOUNTER — Ambulatory Visit: Payer: 59 | Attending: General Surgery

## 2020-09-23 ENCOUNTER — Other Ambulatory Visit: Payer: Self-pay

## 2020-09-23 DIAGNOSIS — Z483 Aftercare following surgery for neoplasm: Secondary | ICD-10-CM | POA: Insufficient documentation

## 2020-09-23 NOTE — Therapy (Signed)
[place], [place], [address] Phone: [phone]   Fax:  [phone]  Physical Therapy Treatment  Patient Details  Name: Kelli Horn MRN: [id number] Date of Birth: 03/07/1956 Referring Provider (PT): Dr. Autumn Messing   Encounter Date: 09/23/2020   PT End of Session - 09/23/20 1608    Visit Number 2   # unchanged due to screen only   Number of Visits 2    Date for PT Re-Evaluation 07/17/20    PT Start Time 1559    PT Stop Time 1612    PT Time Calculation (min) 13 min    Activity Tolerance Patient tolerated treatment well    Behavior During Therapy [facility] for tasks assessed/performed           Past Medical History:  Diagnosis Date  . Acid reflux   . Anxiety   . Asthma   . 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 Chronic kidney disease    Right side question infarction  . COPD (chronic obstructive pulmonary disease) ([place])   . Diabetes mellitus without complication (HCC)    no meds  . Hyperlipidemia   . Hypertension   . PAD (peripheral artery disease) ([place])   . Peripheral vascular disease ([place])    severe athrosclerosis  . Sleep apnea     no cpap at this time. Sleep study done Jun 20 2020    Past Surgical History:  Procedure Laterality Date  . ABDOMINAL AORTOGRAM W/LOWER EXTREMITY Bilateral 08/08/2020   Procedure: ABDOMINAL AORTOGRAM W/LOWER EXTREMITY;  Surgeon: [name], MD;  Location: [facility];  Service: Cardiovascular;  Laterality: Bilateral;  . APPENDECTOMY    . BIOPSY  06/27/2020   Procedure: BIOPSY;  Surgeon: [name], MD;  [facility];  Service: Endoscopy;;  . BREAST LUMPECTOMY WITH RADIOACTIVE SEED AND SENTINEL LYMPH NODE BIOPSY Bilateral 06/07/2020   Procedure: BILATERAL BREAST LUMPECTOMY WITH RADIOACTIVE SEED AND LEFT SENTINEL LYMPH NODE BIOPSY;  Surgeon: [name], MD;  Location: [place];  Service:  General;  Laterality: Bilateral;  PEC BLOCK  . COLONOSCOPY WITH PROPOFOL N/A 06/27/2020   Procedure: COLONOSCOPY WITH PROPOFOL;  Surgeon: [name], MD;  [facility];  Service: Endoscopy;  Laterality: N/A;  . LEFT HEART CATH AND CORONARY ANGIOGRAPHY N/A 04/23/2020   Procedure: LEFT HEART CATH AND CORONARY ANGIOGRAPHY;  Surgeon: Martinique, Peter M, MD;  Location: [facility];  Service: Cardiovascular;  Laterality: N/A;  . POLYPECTOMY  06/27/2020   Procedure: POLYPECTOMY;  Surgeon: [name], MD;  [facility];  Service: Endoscopy;;  . ROBOTIC ASSISTED TOTAL HYSTERECTOMY WITH BILATERAL SALPINGO OOPHERECTOMY N/A 07/11/2020   Procedure: XI ROBOTIC ASSISTED TOTAL HYSTERECTOMY WITH BILATERAL SALPINGO OOPHORECTOMY;  Surgeon: [name], MD;  [facility];  Service: Gynecology;  Laterality: N/A;    There were no vitals filed for this visit.   Subjective Assessment - 09/23/20 1620    Subjective Pt returns for her 3 month L-Dex screen.    Pertinent History Patient was diagnosed on 04/10/2020 with left DCIS breast cancer with concerns of possible invasion. She underwent a bilateral lumpectomy and sentinel node biopsy (1 negative node) on 06/07/2020. She was diagnosed with endometrial cancer in early October. She is scheduled for a hysterectomy 11/11/2021It is ER/PR positive. She has a complex sclerosing lesion in the right breast. She has diabetes, hypertension, severe vascular disease and COPD.  L-DEX FLOWSHEETS - 09/23/20 1600      L-DEX LYMPHEDEMA SCREENING   Measurement Type Unilateral    L-DEX MEASUREMENT EXTREMITY Upper Extremity    POSITION  Standing    DOMINANT SIDE Right    At Risk Side Left    BASELINE SCORE (UNILATERAL) 0    L-DEX SCORE (UNILATERAL) -0.9    VALUE CHANGE (UNILAT) -0.9                                  PT Long Term Goals - 07/01/20 1608      PT LONG TERM GOAL #1   Title Patient  will demonstrate she has regained full shoulder ROM and function post operatively compared to baselines.    Time 8    Period Weeks    Status Achieved                 Plan - 09/23/20 1618    Clinical Impression Statement Pt returns for her 3 month L-dex screen. Her change from baseline of -0.9 is WNLs so no further treatment is required at this time except to continue L-dex screens every 3 months. She reports she doesn't think she ever got signed up for the ABC class and would like to get signed up now so added her to the next class on Feb 9. Pt also wanted to know if aquatic therapy ok with being at risk for lymphedema so encouraged her that this was excellent exercise as this can help reduce risk of lymphedema and try to begin this and work towards daily walking routine as soon as able as her losing weight would be best way to reduce her risk of lymphedema. Pt verbalized understanding.    PT Next Visit Plan Cont every 3 month L-Dex screens for up to 2 years from her SLNB which will be around 06/07/2022.    Consulted and Agree with Plan of Care Patient           Patient will benefit from skilled therapeutic intervention in order to improve the following deficits and impairments:     Visit Diagnosis: Aftercare following surgery for neoplasm     Problem List Patient Active Problem List   Diagnosis Date Noted  . Aortic occlusion ([place]) 08/20/2020  . Endometrial cancer ([place]) 07/11/2020  . Rectal bleeding 06/27/2020  . Carotid artery disease ([place]) 05/29/2020  . 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 PAD (peripheral artery disease) ([place]) 04/23/2020  . Upper back pain 03/24/2016  . Left shoulder pain 03/24/2016  . Hypersomnia with sleep apnea 02/13/2016  . 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 Morbid obesity due to excess calories ([place]) 02/13/2016  . 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 Weakness of both legs 02/13/2016   . Angina syndrome, abdominal ([place]) 02/13/2016  . History of progressive weakness 01/10/2016  . Unilateral facial pain 01/10/2016  . Confusion 01/10/2016  . Gait instability 01/10/2016  . Chest pain 12/11/2015  . Hypertension 12/11/2015  . Asthma 12/11/2015  . Normocytic anemia 12/11/2015  . Leukocytosis 12/11/2015  . Prediabetes 12/11/2015  . Unstable angina ([place]) 12/11/2015  . Chest pain with moderate risk for cardiac etiology   . Dyslipidemia   . Pain in the chest     Otelia Limes, PTA 09/23/2020, 5:11 PM  [place], [place], [address] Phone: [phone]  Fax:  [phone]  Name: Kelli Horn MRN: [id number] Date of Birth: 17-Nov-1955

## 2020-10-01 ENCOUNTER — Other Ambulatory Visit: Payer: Self-pay

## 2020-10-02 ENCOUNTER — Ambulatory Visit (INDEPENDENT_AMBULATORY_CARE_PROVIDER_SITE_OTHER): Payer: 59 | Admitting: Family Medicine

## 2020-10-02 ENCOUNTER — Encounter: Payer: Self-pay | Admitting: Cardiovascular Disease

## 2020-10-02 ENCOUNTER — Ambulatory Visit (INDEPENDENT_AMBULATORY_CARE_PROVIDER_SITE_OTHER): Payer: 59 | Admitting: Cardiovascular Disease

## 2020-10-02 ENCOUNTER — Encounter: Payer: Self-pay | Admitting: Family Medicine

## 2020-10-02 VITALS — BP 146/82 | HR 102 | Temp 97.1°F | Ht 69.0 in | Wt 316.4 lb

## 2020-10-02 VITALS — BP 150/80 | HR 90 | Ht 70.0 in | Wt 317.0 lb

## 2020-10-02 DIAGNOSIS — J4540 Moderate persistent asthma, uncomplicated: Secondary | ICD-10-CM

## 2020-10-02 DIAGNOSIS — G4710 Hypersomnia, unspecified: Secondary | ICD-10-CM

## 2020-10-02 DIAGNOSIS — E785 Hyperlipidemia, unspecified: Secondary | ICD-10-CM

## 2020-10-02 DIAGNOSIS — R7303 Prediabetes: Secondary | ICD-10-CM

## 2020-10-02 DIAGNOSIS — K5730 Diverticulosis of large intestine without perforation or abscess without bleeding: Secondary | ICD-10-CM | POA: Insufficient documentation

## 2020-10-02 DIAGNOSIS — Z6841 Body Mass Index (BMI) 40.0 and over, adult: Secondary | ICD-10-CM

## 2020-10-02 DIAGNOSIS — C541 Malignant neoplasm of endometrium: Secondary | ICD-10-CM

## 2020-10-02 DIAGNOSIS — G4730 Sleep apnea, unspecified: Secondary | ICD-10-CM

## 2020-10-02 DIAGNOSIS — K056 Periodontal disease, unspecified: Secondary | ICD-10-CM | POA: Insufficient documentation

## 2020-10-02 DIAGNOSIS — I739 Peripheral vascular disease, unspecified: Secondary | ICD-10-CM | POA: Diagnosis not present

## 2020-10-02 DIAGNOSIS — E663 Overweight: Secondary | ICD-10-CM | POA: Diagnosis not present

## 2020-10-02 MED ORDER — CHLORHEXIDINE GLUCONATE 0.12 % MT SOLN: 15.0000 mL | Freq: Two times a day (BID) | OROMUCOSAL | 0 refills | Status: DC

## 2020-10-02 NOTE — Progress Notes (Signed)
[facility] [address] [place] [address] Dept: [phone] Dept Fax: [phone]  New Patient Office Visit  Subjective:    Patient ID: Kelli Horn, female    DOB: [birth year], 65 y.o..   MRN: [id number]  Chief Complaint  Patient presents with  . Establish Care    New patient, no concerns.     History of Present Illness:  Patient is in today to establish care. [name] has a complicated medical history.  Breast Cancer- [name] notes she has had cancer in both breasts. In Oct. 2021 she had bilateral breast lumpectomies with radioactive seed placement and left sentinel node dissection for DCIS. She opted to not have adjuvant radiation. Reviewed records from Dr. Mosetta Putt. She recommended a antiestrogen therapy due to strongly + ER/PR. Concern for medication side effects and sensitivity led to agreement to not proceed with this at the current time.  Endometrial cancer- [name] had a TAH/BSO in Nov. 2021 for endometriod carcinoma (T1a, Grade1). Chemotherapy was not indicated.  Aortic Obstruction/Peripheral Artery Disease- [name] is followed by Dr. Swaziland (cardiology), Dr. Allyson Sabal (cardiology/vascular), and Dr. Chestine Spore (vascular surgery). In Dec. 2021, she was found to have an aortic obstruction. She was recently seen by Dr. Chestine Spore to consider an aortobifemoral bypass. He was concerned about potential morbidity/mortality of proceeding with surgery at this point, esp. Related to [name] obesity. Her cardiologist has referred her to nutrition to start working on weight loss.  Morbid obesity: [name] wants to work towards weight loss, esp. related to eligibility for her needed surgery. Se is concerned about how successful she will be in light of limitations she notes related to her asthma.  Dental issues: [name] notes that she has had several ED visits and been given antibiotics related to ongoing  peridontal disease. She has been recommended to have all of her teeth extracted. She has not moved ahead with this at the current time. She has previously used Peridex and found that this did help. She wonders about a prescription for this in the interim to help reduce her need for further antibiotics.  Sleep apnea: [name] had been noted to have some degree of sleep disturbance. One clinician had felt she may have narcolepsy. However, she notes she had a home sleep study performed by Dr. Vassie Loll that demonstrated sleep apnea. She has been given a CPAP machine (sent to her home), but finds she cannot tolerate putting it on. She has never had formal instruction or titration of this device.  Asthma: [name] is followed by Dr. Francine Graven for her asthma. She notes that smoke (from [employer]) are a strong trigger for her asthma. She is managed with Trelegy, albuterol MDI, and Flovent for flares that are not responding to the above approach. She did have a recent course of prednisone. Currently this is stable.  Past Medical History: Patient Active Problem List   Diagnosis Date Noted  . Diverticular disease of colon 10/02/2020  . Periodontal disease 10/02/2020  . Aortic occlusion (HCC) 08/20/2020  . Endometrial cancer (HCC) 07/11/2020  . Rectal bleeding 06/27/2020  . Carotid artery disease (HCC) 05/29/2020  . 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 PAD (peripheral artery disease) (HCC) 04/23/2020  . Upper back pain 03/24/2016  . Left shoulder pain 03/24/2016  . Hypersomnia with sleep apnea 02/13/2016  . 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 Morbid obesity with body mass index (BMI) of 45.0 to  49.9 in adult [facility]) 02/13/2016  . 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 Weakness of both legs 02/13/2016  . Angina syndrome, abdominal (HCC) 02/13/2016  . History of progressive weakness 01/10/2016  . Unilateral facial pain 01/10/2016  .  Confusion 01/10/2016  . Gait instability 01/10/2016  . Chest pain 12/11/2015  . Hypertension 12/11/2015  . Asthma 12/11/2015  . Normocytic anemia 12/11/2015  . Leukocytosis 12/11/2015  . Prediabetes 12/11/2015  . Unstable angina (HCC) 12/11/2015  . Chest pain with moderate risk for cardiac etiology   . Dyslipidemia    Past Surgical History:  Procedure Laterality Date  . ABDOMINAL AORTOGRAM W/LOWER EXTREMITY Bilateral 08/08/2020   Procedure: ABDOMINAL AORTOGRAM W/LOWER EXTREMITY;  Surgeon: Runell Gess, MD;  Location: [facility];  Service: Cardiovascular;  Laterality: Bilateral;  . APPENDECTOMY    . BIOPSY  06/27/2020   Procedure: BIOPSY;  Surgeon: Charlott Rakes, MD;  [facility];  Service: Endoscopy;;  . BREAST LUMPECTOMY WITH RADIOACTIVE SEED AND SENTINEL LYMPH NODE BIOPSY Bilateral 06/07/2020   Procedure: BILATERAL BREAST LUMPECTOMY WITH RADIOACTIVE SEED AND LEFT SENTINEL LYMPH NODE BIOPSY;  Surgeon: Griselda Miner, MD;  Location: [facility];  Service: General;  Laterality: Bilateral;  PEC BLOCK  . COLONOSCOPY WITH PROPOFOL N/A 06/27/2020   Procedure: COLONOSCOPY WITH PROPOFOL;  Surgeon: Charlott Rakes, MD;  [facility];  Service: Endoscopy;  Laterality: N/A;  . LEFT HEART CATH AND CORONARY ANGIOGRAPHY N/A 04/23/2020   Procedure: LEFT HEART CATH AND CORONARY ANGIOGRAPHY;  Surgeon: Swaziland, Peter M, MD;  Location: [facility] INVASIVE [facility] LAB;  Service: Cardiovascular;  Laterality: N/A;  . POLYPECTOMY  06/27/2020   Procedure: POLYPECTOMY;  Surgeon: Charlott Rakes, MD;  [facility];  Service: Endoscopy;;  . ROBOTIC ASSISTED TOTAL HYSTERECTOMY WITH BILATERAL SALPINGO OOPHERECTOMY N/A 07/11/2020   Procedure: XI ROBOTIC ASSISTED TOTAL HYSTERECTOMY WITH BILATERAL SALPINGO OOPHORECTOMY;  Surgeon: Adolphus Birchwood, MD;  [facility];  Service: Gynecology;  Laterality: N/A;   Family History  Problem Relation Age of Onset  . Heart attack  Father 94  . Aneurysm Father   . Breast cancer Maternal Aunt   . Lung cancer Mother   . Diabetes Other   . Heart disease Paternal Uncle   . 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 He was born very prematurely and has cerebral palsy secondary to birth trauma. He has more recently been experiencing progressive kidney failure and is currently in the hospital in the ICU. Ms. Dungee is currently facing making decision about whether to attempt dialysis vs. allowing natural death.  Outpatient Medications Prior to Visit  Medication Sig Dispense Refill  . albuterol (PROVENTIL HFA;VENTOLIN HFA) 108 (90 Base) MCG/ACT inhaler Inhale 2 puffs into the lungs every 6 (six) hours as needed for wheezing or shortness of breath.     Marland Kitchen albuterol (PROVENTIL) (2.5 MG/3ML) 0.083% nebulizer solution Take 2.5 mg by nebulization every 6 (six) hours as needed for shortness of breath.    Marland Kitchen amLODipine (NORVASC) 5 MG tablet Take 5 mg by mouth daily.  180 tablet 3  . aspirin EC 81 MG tablet Take 81 mg by mouth daily. Swallow whole.    . b complex vitamins capsule Take 1 capsule by mouth daily.    . enalapril (VASOTEC) 10 MG tablet Take 15 mg by mouth 2 (two) times daily.    . Fluticasone-Umeclidin-Vilant (TRELEGY ELLIPTA) 100-62.5-25 MCG/INH AEPB Inhale  1 puff into the lungs daily. 1 each 6  . hydrocortisone (ANUSOL-HC) 25 MG suppository 1 suppository    . hydrOXYzine (VISTARIL) 25 MG capsule Take 25 mg by mouth 4 (four) times daily as needed.    . methocarbamol (ROBAXIN) 750 MG tablet Take 750 mg by mouth 2 (two) times daily as needed for muscle spasms.     Vladimir Faster Glycol-Propyl Glycol 0.4-0.3 % SOLN Place 1 drop into both eyes 3 (three) times daily as needed (dry eyes).     . rosuvastatin (CRESTOR) 5 MG tablet Take 5 mg twice a week (Patient taking differently: Take 5 mg by mouth every Saturday.) 30 tablet 6  . fluticasone (FLOVENT HFA) 110 MCG/ACT inhaler Inhale 2  puffs into the lungs 2 (two) times daily as needed. (Patient not taking: Reported on 10/02/2020) 1 each 12  . Ibuprofen 200 MG CAPS 2 capsules    . predniSONE (STERAPRED UNI-PAK 21 TAB) 10 MG (21) TBPK tablet Take by mouth daily. (Patient not taking: Reported on 10/02/2020) 14 tablet 0   Facility-Administered Medications Prior to Visit  Medication Dose Route Frequency Provider Last Rate Last Admin  . sodium chloride flush (NS) 0.9 % injection 3 mL  3 mL Intravenous Q12H [name], MD        Allergies  Allergen Reactions  . Beta Adrenergic Blockers Other (See Comments)    Trigger Asthma  . Carvedilol Shortness Of Breath    Reacts with albuterol, worsens asthma symptoms when taken together   . Peanut-Containing Drug Products Other (See Comments)     Trigger Asthma attack  . Statins Other (See Comments)     Objective:   Today's Vitals   10/02/20 1404  BP: (!) 146/82  Pulse: (!) 102  Temp: (!) 97.1 F (36.2 C)  TempSrc: Temporal  SpO2: 96%  Weight: (!) 316 lb 6.4 oz (143.5 kg)  Height: 5\' 9"  (1.753 m)   Body mass index is 46.72 kg/m.   General: Well developed, well nourished. Obese. No acute distress. HEENT: Mucous membranes moist. Oropharynx clear. There is considerable gum recession with redness  And some loosened   teeth.  Lungs: Clear to auscultation bilaterally. CV: RRR without murmurs or rubs. Pulses 2+ bilaterally. Psych: Alert and oriented x3. Mildly tearful with mildly depressed mood and affect.  Health Maintenance Due  Topic Date Due  . Hepatitis C Screening  Never done  . HIV Screening  Never done  . PAP SMEAR-Modifier  Never done  . COVID-19 Vaccine (3 - Pfizer risk 4-dose series) 07/29/2020     Assessment & Plan:   1. PAD (peripheral artery disease) ([place]) Ms. Florido has been recommended to have aorto-bifemoral artery bypass surgery. However, this is being delayed while giving her a chance to lose weight. This is expected to reduce the risk for  complications and/or death associated with this surgery. She is remaining vigilant for lower extremity sores in the meantime.  2. Moderate persistent asthma without complication I spent time discussing the pathogenesis of asthma and discussing the roll of her inhalers in managing this. I reinforced the important role the Trelegy plays, even if she doesn't feel more immediate relief with use of the inhaler.  3. Endometrial cancer (HCC) Recent TAH/BSO appears to have been curative.  4. Prediabetes Last HbA1c was 6.1%, c/w pre-diabetes. At the current time, Ms. Hughart will focus on diet and weight loss. Recommend checking HbA1c every 6 months.  5. Dyslipidemia Currently on rosuvastatin.  6. Morbid obesity  with body mass index (BMI) of 45.0 to 49.9 in adult [facility]) Recent referral to nutrition. We will assist in monitoring progress.  7. Periodontal disease It is reasonable to temporize with a renewal of Peridex. I did reinforce the recommendaiton for her to complete dental extractions. This likely will improve her overall health.  - chlorhexidine (PERIDEX) 0.12 % solution; Use as directed 15 mLs in the mouth or throat 2 (two) times daily.  Dispense: 120 mL; Refill: 0  8. Hypersomnia with sleep apnea I recommended she reach out to Dr. [name] to request teaching and titration for her CPAP unit.  Plan follow-up in 3 months, or sooner if needed.  [name], MD

## 2020-10-02 NOTE — Progress Notes (Signed)
Kelli Horn returns for follow-up.  She did see Dr. [name], vascular surgeon who I sent her to for consideration of aortobifemoral bypass grafting.  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  She is high risk because of her body habitus.  She does not smoke.  She does not have an open wound.  She does have lifestyle limiting claudication.  I suggested that she aggressively pursue weight loss over the next 6 to 12 months and then we can revisit her surgical risk after that.  I am referring her to Dr. Leafy Ro, diet and wellness center for further evaluation and treatment.  [name], M.D., [place], [facility], [place] [address] [place], [place]  [address]  [phone] 10/02/2020 10:39 AM

## 2020-10-02 NOTE — Patient Instructions (Signed)
Medication Instructions:  Your physician recommends that you continue on your current medications as directed. Please refer to the Current Medication list given to you today.  *If you need a refill on your cardiac medications before your next appointment, please call your pharmacy*   Follow-Up: At [facility], you and your health needs are our priority.  As part of our continuing mission to provide you with exceptional heart care, we have created designated Provider Care Teams.  These Care Teams include your primary Cardiologist (physician) and Advanced Practice Providers (APPs -  Physician Assistants and Nurse Practitioners) who all work together to provide you with the care you need, when you need it.  We recommend signing up for the patient portal called "MyChart".  Sign up information is provided on this After Visit Summary.  MyChart is used to connect with patients for Virtual Visits (Telemedicine).  Patients are able to view lab/test results, encounter notes, upcoming appointments, etc.  Non-urgent messages can be sent to your provider as well.   To learn more about what you can do with MyChart, go to [email or web address].    Your next appointment:   6 month(s)  The format for your next appointment:   In Person  Provider:   Quay Burow, MD   Other Instructions: Referral made to Diet and weight loss Management center with Dr. Dennard Nip.  [place], [place], [place] [address] [phone]

## 2020-10-16 ENCOUNTER — Telehealth: Payer: Self-pay | Admitting: Pulmonary Disease

## 2020-10-16 NOTE — Telephone Encounter (Signed)
Called and spoke with patient who states that she got her CPAP machine and she thinks that the pressures needs to be adjusted. She states that she has yet to wear the machine because when she turns it on it gives to much air and takes her breath away. Patient was advised she needed to come in for an appointment for a compliance check. She is now scheduled for 10/21/20 at 3:30 with Derl Barrow. I called [employer] to see if she could be added to [facility] and was told the patient has a Personnel officer. They are working on getting her added so we can get a download for her appointment. Nothing further needed at this time.

## 2020-10-21 ENCOUNTER — Other Ambulatory Visit: Payer: Self-pay

## 2020-10-21 ENCOUNTER — Telehealth: Payer: Self-pay

## 2020-10-21 ENCOUNTER — Encounter: Payer: Self-pay | Admitting: Primary Care

## 2020-10-21 ENCOUNTER — Ambulatory Visit (INDEPENDENT_AMBULATORY_CARE_PROVIDER_SITE_OTHER): Payer: 59 | Admitting: Primary Care

## 2020-10-21 VITALS — BP 132/84 | HR 83 | Temp 98.2°F | Ht 69.0 in | Wt 316.0 lb

## 2020-10-21 DIAGNOSIS — Z6841 Body Mass Index (BMI) 40.0 and over, adult: Secondary | ICD-10-CM | POA: Diagnosis not present

## 2020-10-21 DIAGNOSIS — G4733 Obstructive sleep apnea (adult) (pediatric): Secondary | ICD-10-CM

## 2020-10-21 MED ORDER — ENALAPRIL MALEATE 10 MG PO TABS
15.0000 mg | ORAL_TABLET | Freq: Two times a day (BID) | ORAL | 0 refills | Status: DC
Start: 2020-10-21 — End: 2020-11-11

## 2020-10-21 MED ORDER — ALBUTEROL SULFATE HFA 108 (90 BASE) MCG/ACT IN AERS: 2.0000 | INHALATION_SPRAY | Freq: Four times a day (QID) | RESPIRATORY_TRACT | 0 refills | Status: DC | PRN

## 2020-10-21 NOTE — Progress Notes (Signed)
@Patient  ID: Kelli Horn, female    DOB: 1956/03/09, 65 y.o.   MRN: [id number]  Chief Complaint  Patient presents with  . Follow-up    Pressure to high on CPAP machine. DME- [employer]    Referring provider: [name], MD  HPI: 65 year old female, former smoker quit in 2005 (20 pack year hx). PMH significant for asthma, OSA, narcolepsy with cataplexy, CAD, HTN, dyslipidemia, breast cancer, endometrial cancer, morbid obesity. Patient of Dr. Erin Fulling, last seen on 08/07/20. Following with Dr. [name] for sleep apnea. HST showed mild OSA. Started on auto CPAP 5-12cm h20 with nasal pillow/mask. Narcolepsy not a definitive diagnosis as she never underwent MSLT. Holding off on any further testing.   10/21/2020 Patient presents today for acute visit/sleep apnea. She feels her CPAP machine is giving off too much pressure and needs to be adjusted. She has not been able to wear CPAP mask or tried putting it on due to pressure being too strong. She does not do well with any type of mask over her face, she has full nasal mask but would prefer the pillows. She has a Personnel officer, received in December 2021. Her DME company is [employer]. No download available, no data on SD card.   Sleep test: 06/21/20 HST >> AHI 7.7, SpO2 low 84%   Allergies  Allergen Reactions  . Beta Adrenergic Blockers Other (See Comments)    Trigger Asthma  . Carvedilol Shortness Of Breath    Reacts with albuterol, worsens asthma symptoms when taken together   . Peanut-Containing Drug Products Other (See Comments)     Trigger Asthma attack  . Statins Other (See Comments)    Immunization History  Administered Date(s) Administered  . PFIZER(Purple Top)SARS-COV-2 Vaccination 06/10/2020, 07/01/2020    Past Medical History:  Diagnosis Date  . Acid reflux   . Anxiety   . Asthma   . 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 Chronic kidney disease    Right side question infarction  . COPD (chronic obstructive  pulmonary disease) ([place])   . Diabetes mellitus without complication ([facility])    no meds  . Hyperlipidemia   . Hypertension   . PAD (peripheral artery disease) ([place])   . Peripheral vascular disease ([place])    severe athrosclerosis  . Sleep apnea     no cpap at this time. Sleep study done Jun 20 2020    Tobacco History: Social History   Tobacco Use  Smoking Status Former Smoker  . Packs/day: 2.00  . Years: 10.00  . Pack years: 20.00  . Types: Cigarettes  . Quit date: 04/16/2004  . Years since quitting: 16.5  Smokeless Tobacco Never Used   Counseling given: Not Answered   Outpatient Medications Prior to Visit  Medication Sig Dispense Refill  . albuterol (PROVENTIL) (2.5 MG/3ML) 0.083% nebulizer solution Take 2.5 mg by nebulization every 6 (six) hours as needed for shortness of breath.    Marland Kitchen amLODipine (NORVASC) 5 MG tablet Take 5 mg by mouth daily.  180 tablet 3  . aspirin EC 81 MG tablet Take 81 mg by mouth daily. Swallow whole.    . b complex vitamins capsule Take 1 capsule by mouth daily.    . chlorhexidine (PERIDEX) 0.12 % solution Use as directed 15 mLs in the mouth or throat 2 (two) times daily. 120 mL 0  . enalapril (VASOTEC) 10 MG tablet Take 1.5 tablets (15 mg total) by mouth 2 (two) times daily. [place]  tablet 0  . fluticasone (FLOVENT HFA) 110 MCG/ACT inhaler Inhale 2 puffs into the lungs 2 (two) times daily as needed. 1 each 12  . Fluticasone-Umeclidin-Vilant (TRELEGY ELLIPTA) 100-62.5-25 MCG/INH AEPB Inhale 1 puff into the lungs daily. 1 each 6  . hydrocortisone (ANUSOL-HC) 25 MG suppository 1 suppository    . hydrOXYzine (VISTARIL) 25 MG capsule Take 25 mg by mouth 4 (four) times daily as needed.    . Ibuprofen 200 MG CAPS 2 capsules    . methocarbamol (ROBAXIN) 750 MG tablet Take 750 mg by mouth 2 (two) times daily as needed for muscle spasms.     Vladimir Faster Glycol-Propyl Glycol 0.4-0.3 % SOLN Place 1 drop into both eyes 3 (three) times daily as needed (dry eyes).     .  rosuvastatin (CRESTOR) 5 MG tablet Take 5 mg twice a week (Patient taking differently: Take 5 mg by mouth every Saturday.) 30 tablet 6  . albuterol (PROVENTIL HFA;VENTOLIN HFA) 108 (90 Base) MCG/ACT inhaler Inhale 2 puffs into the lungs every 6 (six) hours as needed for wheezing or shortness of breath.     . enalapril (VASOTEC) 10 MG tablet Take 15 mg by mouth 2 (two) times daily.     Facility-Administered Medications Prior to Visit  Medication Dose Route Frequency Provider Last Rate Last Admin  . sodium chloride flush (NS) 0.9 % injection 3 mL  3 mL Intravenous Q12H [name], MD       Review of Systems  Review of Systems  Constitutional: Negative.   Respiratory: Negative.   Psychiatric/Behavioral: Positive for sleep disturbance.   Physical Exam  BP 132/84 (BP Location: Left Arm, Cuff Size: Large)   Pulse 83   Temp 98.2 F (36.8 C)   Ht 5\' 9"  (1.753 m)   Wt (!) 316 lb (143.3 kg)   LMP 12/09/2013   SpO2 96%   BMI 46.67 kg/m  Physical Exam Constitutional:      Appearance: Normal appearance. She is obese.  HENT:     Mouth/Throat:     Comments: Mallampati class II-III Cardiovascular:     Rate and Rhythm: Normal rate and regular rhythm.  Pulmonary:     Effort: Pulmonary effort is normal.     Breath sounds: Normal breath sounds.  Musculoskeletal:        General: Normal range of motion.  Skin:    General: Skin is warm and dry.  Neurological:     General: No focal deficit present.     Mental Status: She is alert and oriented to person, place, and time. Mental status is at baseline.  Psychiatric:        Mood and Affect: Mood normal.        Behavior: Behavior normal.        Thought Content: Thought content normal.        Judgment: Judgment normal.      Lab Results:  CBC    Component Value Date/Time   WBC 13.8 (H) 08/02/2020 1635   WBC 9.5 07/05/2020 1222   RBC 4.83 08/02/2020 1635   RBC 4.89 07/05/2020 1222   HGB 13.0 08/02/2020 1635   HCT 39.3 08/02/2020  1635   PLT 466 (H) 08/02/2020 1635   MCV 81 08/02/2020 1635   MCH 26.9 08/02/2020 1635   MCH 27.2 07/05/2020 1222   MCHC 33.1 08/02/2020 1635   MCHC 32.0 07/05/2020 1222   RDW 12.4 08/02/2020 1635   LYMPHSABS 2.2 05/22/2020 1206   LYMPHSABS 2.7 04/19/2020  1144   MONOABS 1.0 05/22/2020 1206   EOSABS 0.3 05/22/2020 1206   EOSABS 0.4 04/19/2020 1144   BASOSABS 0.1 05/22/2020 1206   BASOSABS 0.1 04/19/2020 1144    BMET    Component Value Date/Time   NA 141 08/02/2020 1635   K 4.5 08/02/2020 1635   CL 102 08/02/2020 1635   CO2 24 08/02/2020 1635   GLUCOSE 101 (H) 08/02/2020 1635   GLUCOSE 117 (H) 07/05/2020 1222   BUN 14 08/02/2020 1635   CREATININE 0.95 08/02/2020 1635   CREATININE 0.82 05/22/2020 1206   CALCIUM 9.4 08/02/2020 1635   GFRNONAA 64 08/02/2020 1635   GFRNONAA >60 07/05/2020 1222   GFRNONAA >60 05/22/2020 1206   GFRAA 74 08/02/2020 1635   GFRAA >60 05/22/2020 1206    BNP    Component Value Date/Time   BNP 260.0 (H) 12/11/2015 0023    ProBNP No results found for: PROBNP  Imaging: No results found.   Assessment & Plan:   OSA (obstructive sleep apnea) - She has mild OSA with AHI 7.7.hr. Wakes up gasping for air. She has been unable to wear CPAP mask d/t too much pressure. Not clear she will be able to tolerate PAP therapy but we will try her on lowest pressure and can adjust settings accordingly. May need cpap titration study, other options would be oral appliance.  - Recommend trial set pressure 5cm h20 and change to nasal pillow mask  - FU in 3-4 weeks in office to review compliance/download  Morbid obesity with body mass index (BMI) of 45.0 to 49.9 in adult [facility]) - Referral was placed to medical weight management on 10/02/20 by Dr. Charna Busman, NP 10/21/2020

## 2020-10-21 NOTE — Telephone Encounter (Signed)
Refills sent in patient aware.  

## 2020-10-21 NOTE — Assessment & Plan Note (Signed)
-   Referral was placed to medical weight management on 10/02/20 by Dr. [name]

## 2020-10-21 NOTE — Telephone Encounter (Signed)
Pt saw Dr Gena Fray on 10/02/20. She needs refills of these medications;  enalapril (VASOTEC) 10 MG tablet  albuterol (PROVENTIL HFA;VENTOLIN HFA) 108 (90 Base) MCG/ACT inhaler   Send to [facility] on [address].

## 2020-10-21 NOTE — Patient Instructions (Addendum)
Recommendations: - Try wearing CPAP mask during the day without attached to CPAP and then try wearing during the day attached with pressure 5 mins - Recommend putting mask on before starting CPAP  - Continue to wear CPAP every night for minimum 4-6 hours or longer - Do not drive if experiencing excessive daytime fatigue or somnolence   Orders: - Changing CPAP to set pressure 5cm h20  - Please provide patient with nasal pillow mask   Follow-up: - 3-4 weeks televisit or in office with Beth    CPAP and BPAP Information CPAP and BPAP are methods that use air pressure to keep your airways open and to help you breathe well. CPAP and BPAP use different amounts of pressure. Your health care provider will tell you whether CPAP or BPAP would be more helpful for you.  CPAP stands for "continuous positive airway pressure." With CPAP, the amount of pressure stays the same while you breathe in and out.  BPAP stands for "bi-level positive airway pressure." With BPAP, the amount of pressure will be higher when you breathe in (inhale) and lower when you breathe out(exhale). This allows you to take larger breaths. CPAP or BPAP may be used in the hospital, or your health care provider may want you to use it at home. You may need to have a sleep study before your health care provider can order a machine for you to use at home. Why are CPAP and BPAP treatments used? CPAP or BPAP can be helpful if you have:  Sleep apnea.  Chronic obstructive pulmonary disease (COPD).  Heart failure.  Medical conditions that cause muscle weakness, including muscular dystrophy or amyotrophic lateral sclerosis (ALS).  Other problems that cause breathing to be shallow, weak, abnormal, or difficult. CPAP and BPAP are most commonly used for obstructive sleep apnea (OSA) to keep the airways from collapsing when the muscles relax during sleep. How is CPAP or BPAP administered? Both CPAP and BPAP are provided by a small machine  with a flexible plastic tube that attaches to a plastic mask that you wear. Air is blown through the mask into your nose or mouth. The amount of pressure that is used to blow the air can be adjusted on the machine. Your health care provider will set the pressure setting and help you find the best mask for you. When should CPAP or BPAP be used? In most cases, the mask only needs to be worn during sleep. Generally, the mask needs to be worn throughout the night and during any daytime naps. People with certain medical conditions may also need to wear the mask at other times when they are awake. Follow instructions from your health care provider about when to use the machine. What are some tips for using the mask?  Because the mask needs to be snug, some people feel trapped or closed-in (claustrophobic) when first using the mask. If you feel this way, you may need to get used to the mask. One way to do this is to hold the mask loosely over your nose or mouth and then gradually apply the mask more snugly. You can also gradually increase the amount of time that you use the mask.  Masks are available in various types and sizes. If your mask does not fit well, talk with your health care provider about getting a different one. Some common types of masks include: ? Full face masks, which fit over the mouth and nose. ? Nasal masks, which fit over the nose. ?  Nasal pillow or prong masks, which fit into the nostrils.  If you are using a mask that fits over your nose and you tend to breathe through your mouth, a chin strap may be applied to help keep your mouth closed.  Some CPAP and BPAP machines have alarms that may sound if the mask comes off or develops a leak.  If you have trouble with the mask, it is very important that you talk with your health care provider about finding a way to make the mask easier to tolerate. Do not stop using the mask. There could be a negative impact to your health if you stop using  the mask.   What are some tips for using the machine?  Place your CPAP or BPAP machine on a secure table or stand near an electrical outlet.  Know where the on/off switch is on the machine.  Follow instructions from your health care provider about how to set the pressure on your machine and when you should use it.  Do not eat or drink while the CPAP or BPAP machine is on. Food or fluids could get pushed into your lungs by the pressure of the CPAP or BPAP.  For home use, CPAP and BPAP machines can be rented or purchased through home health care companies. Many different brands of machines are available. Renting a machine before purchasing may help you find out which particular machine works well for you. Your insurance may also decide which machine you may get.  Keep the CPAP or BPAP machine and attachments clean. Ask your health care provider for specific instructions. Follow these instructions at home:  Do not use any products that contain nicotine or tobacco, such as cigarettes, e-cigarettes, and chewing tobacco. If you need help quitting, ask your health care provider.  Keep all follow-up visits as told by your health care provider. This is important. Contact a health care provider if:  You have redness or pressure sores on your head, face, mouth, or nose from the mask or head gear.  You have trouble using the CPAP or BPAP machine.  You cannot tolerate wearing the CPAP or BPAP mask.  Someone tells you that you snore even when wearing your CPAP or BPAP. Get help right away if:  You have trouble breathing.  You feel confused. Summary  CPAP and BPAP are methods that use air pressure to keep your airways open and to help you breathe well.  You may need to have a sleep study before your health care provider can order a machine for home use.  If you have trouble with the mask, it is very important that you talk with your health care provider about finding a way to make the mask  easier to tolerate. Do not stop using the mask. There could be a negative impact to your health if you stop using the mask.  Follow instructions from your health care provider about when to use the machine. This information is not intended to replace advice given to you by your health care provider. Make sure you discuss any questions you have with your health care provider. Document Revised: 09/08/2019 Document Reviewed: 09/11/2019 Elsevier Patient Education  2021 [employer].

## 2020-10-21 NOTE — Telephone Encounter (Signed)
Spoke with patient to bring SD card to visit. Nothing further needed.

## 2020-10-21 NOTE — Assessment & Plan Note (Addendum)
-   She has mild OSA with AHI 7.7.hr. Wakes up gasping for air. She has been unable to wear CPAP mask d/t too much pressure. Not clear she will be able to tolerate PAP therapy but we will try her on lowest pressure and can adjust settings accordingly. May need cpap titration study, other options would be oral appliance.  - Recommend trial set pressure 5cm h20 and change to nasal pillow mask  - FU in 3-4 weeks in office to review compliance/download

## 2020-10-28 NOTE — Progress Notes (Unsigned)
Cardiology Office Note   Date:  10/31/2020   ID:  Qianna, Clagett 1956-07-13, MRN [id number]  PCP:  [name], MD  Cardiologist:   Peter Martinique, MD   Chief Complaint  Patient presents with  . Coronary Artery Disease      History of Present Illness: Kelli Horn is a 65 y.o. female who is seen for follow up  SOB. She has a history of DM, HLD,  and HTN. She was seen in the past by Dr Debara Pickett in 2017. Myoview at that time was normal. Echo showed severe LVH with normal systolic function and normal valves. Ecg was normal.   She reports that she has been SOB for years. This has progressed recently and is now severe. CXR was normal. No improvement with albuterol- this just makes her feel jittery. She describes a severe burning discomfort in her right breast. She also has severe burning pain in her left anterior chest associated with nausea, SOB and sweating. This occurs with any activity including walking across the room. Initially this would improve with rest but now it may take over an hour to go away. Hasn't really tried sl Ntg. Feels very shaky. BP has been running high. She has been reluctant to take Crestor because she states it causes severe lower abdominal cramping and diarrhea.   Other studies of note include plain X rays dating back to 2017 that showed significant aortic atherosclerosis. She recently had Abdominal/pelvic CT on 04/05/20 showing severe bilateral common iliac stenosis. 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 There was medical renal disease. She has an [name] report apparently showing a 2.9 cm gallstone but no gallstone noted on CT.  We evaluated her with cardiac cath showing mild nonobstructive CAD. No significant right subclavian/innominate gradient. Mildly elevated LVEDP. Echo showed mild LVH with normal systolic function. Arterial doppler studies show severe bilateral iliac disease with moderate bilateral reduced ABIs  and reduced toe pressures. No significant carotid disease but bilateral subclavian disease noted. She did have PFTs showing severe obstructive disease. Pulmonary evaluation was done who felt she had predominantly asthma. Was started on Trelegy and she notes significant improvement in her breathing. She did have a home sleep study in October showing mild OSA. Being managed by Dr [name]. She also had colonoscopy in October showing multiple polyps that were removed.   She has also was diagnosed with left breast carcinoma. She underwent bilateral breast lumpectomies on October 8. She then underwent total hysterectomy with BSO for endometrial CA on 07/11/20. She decided against RT for her breast CA. To discuss hormonal therapy with Dr Burr Medico. 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 No endovascular options. Was seen by [facility]- Dr [name]. It was felt that surgery would be beneficial but with higher risk due to morbid obesity. Was referred to Weight management to see if she could lose weight to improve her operative risk.   On follow up she is feeling well. States her breathing is good.  No chest pain. Has been taking Crestor once a week and tolerating it well. Still waiting to here from the Weight management center. BP well controlled.     Past Medical History:  Diagnosis Date  . Acid reflux   . Anxiety   . Asthma   . 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 Chronic kidney disease    Right  side question infarction  . COPD (chronic obstructive pulmonary disease) ([place])   . Diabetes mellitus without complication (H[facility])    no meds  . Hyperlipidemia   . Hypertension   . PAD (peripheral artery disease) ([facility] [facility] [place])   . Peripheral vascular disease ([place])    severe athrosclerosis  . Sleep apnea     no cpap at this time. Sleep study done Jun 20 2020    Past Surgical History:  Procedure Laterality Date  . ABDOMINAL AORTOGRAM W/LOWER EXTREMITY  Bilateral 08/08/2020   Procedure: ABDOMINAL AORTOGRAM W/LOWER EXTREMITY;  Surgeon: [name], MD;  Location: [facility];  Service: Cardiovascular;  Laterality: Bilateral;  . APPENDECTOMY    . BIOPSY  06/27/2020   Procedure: BIOPSY;  Surgeon: [name], MD;  Location: [facility];  Service: Endoscopy;;  . BREAST LUMPECTOMY WITH RADIOACTIVE SEED AND SENTINEL LYMPH NODE BIOPSY Bilateral 06/07/2020   Procedure: BILATERAL BREAST LUMPECTOMY WITH RADIOACTIVE SEED AND LEFT SENTINEL LYMPH NODE BIOPSY;  Surgeon: [name], MD;  Location: [facility];  Service: General;  Laterality: Bilateral;  PEC BLOCK  . COLONOSCOPY WITH PROPOFOL N/A 06/27/2020   Procedure: COLONOSCOPY WITH PROPOFOL;  Surgeon: [name], MD;  Location: [facility];  Service: Endoscopy;  Laterality: N/A;  . LEFT HEART CATH AND CORONARY ANGIOGRAPHY N/A 04/23/2020   Procedure: LEFT HEART CATH AND CORONARY ANGIOGRAPHY;  Surgeon: Martinique, Peter M, MD;  Location: [facility];  Service: Cardiovascular;  Laterality: N/A;  . POLYPECTOMY  06/27/2020   Procedure: POLYPECTOMY;  Surgeon: [name], MD;  Location: [facility];  Service: Endoscopy;;  . ROBOTIC ASSISTED TOTAL HYSTERECTOMY WITH BILATERAL SALPINGO OOPHERECTOMY N/A 07/11/2020   Procedure: XI ROBOTIC ASSISTED TOTAL HYSTERECTOMY WITH BILATERAL SALPINGO OOPHORECTOMY;  Surgeon: [name], MD;  Location: [facility];  Service: Gynecology;  Laterality: N/A;     Current Outpatient Medications  Medication Sig Dispense Refill  . albuterol (PROVENTIL) (2.5 MG/3ML) 0.083% nebulizer solution Take 2.5 mg by nebulization every 6 (six) hours as needed for shortness of breath.    Marland Kitchen albuterol (VENTOLIN HFA) 108 (90 Base) MCG/ACT inhaler Inhale 2 puffs into the lungs every 6 (six) hours as needed for wheezing or shortness of breath. 18 g 0  . amLODipine (NORVASC) 5 MG tablet Take 5 mg by mouth daily.  180 tablet 3  . aspirin EC 81 MG tablet  Take 81 mg by mouth daily. Swallow whole.    . b complex vitamins capsule Take 1 capsule by mouth daily.    . chlorhexidine (PERIDEX) 0.12 % solution Use as directed 15 mLs in the mouth or throat 2 (two) times daily. 120 mL 0  . enalapril (VASOTEC) 10 MG tablet Take 1.5 tablets (15 mg total) by mouth 2 (two) times daily. 60 tablet 0  . fluticasone (FLOVENT HFA) 110 MCG/ACT inhaler Inhale 2 puffs into the lungs 2 (two) times daily as needed. 1 each 12  . Fluticasone-Umeclidin-Vilant (TRELEGY ELLIPTA) 100-62.5-25 MCG/INH AEPB Inhale 1 puff into the lungs daily. 1 each 6  . hydrocortisone (ANUSOL-HC) 25 MG suppository 1 suppository    . hydrOXYzine (VISTARIL) 25 MG capsule Take 25 mg by mouth 4 (four) times daily as needed.    . Ibuprofen 200 MG CAPS 2 capsules    . methocarbamol (ROBAXIN) 750 MG tablet Take 750 mg by mouth 2 (two) times daily as needed for muscle spasms.     Vladimir Faster Glycol-Propyl Glycol 0.4-0.3 % SOLN Place 1 drop into both eyes 3 (  three) times daily as needed (dry eyes).     . rosuvastatin (CRESTOR) 5 MG tablet Take 5 mg twice a week (Patient taking differently: Take 5 mg by mouth every Saturday.) 30 tablet 6   Current Facility-Administered Medications  Medication Dose Route Frequency Provider Last Rate Last Admin  . sodium chloride flush (NS) 0.9 % injection 3 mL  3 mL Intravenous Q12H [name], MD        Allergies:   Beta adrenergic blockers, Carvedilol, Peanut-containing drug products, and Statins    Social History:  The patient  reports that she quit smoking about 16 years ago. Her smoking use included cigarettes. She has a 20.00 pack-year smoking history. She has never used smokeless tobacco. She reports that she does not drink alcohol and does not use drugs.   Family History:  The patient's family history includes Aneurysm in her father and paternal uncle; Breast cancer in her maternal aunt; Diabetes in an other family member; Heart attack (age of onset: 8)  in her father; Heart disease in her paternal uncle; Lung cancer in her mother.    ROS:  Please see the history of present illness.   Otherwise, review of systems are negative.   All other systems are reviewed and negative.    PHYSICAL EXAM: VS:  BP 140/75   Pulse 93   Ht 5\' 9"  (1.753 m)   Wt (!) 314 lb 12.8 oz (142.8 kg)   LMP 12/09/2013   SpO2 94%   BMI 46.49 kg/m  , BMI Body mass index is 46.49 kg/m. GEN: Well nourished, morbidly obese, in no acute distress  HEENT: normal  Neck: no JVD,  Left carotid and bilateral subclavian bruits.  Cardiac: RRR; no murmurs, rubs, or gallops,no edema. Femoral and pedal pulses are not palpable.  Respiratory:  clear to auscultation bilaterally, normal work of breathing GI: morbidly obese with large panus. Soft, nontender, nondistended, + BS. I don't hear renal bruits MS: no deformity or atrophy  Skin: warm and dry, no rash Neuro:  Strength and sensation are intact Psych: euthymic mood, full affect   EKG:  EKG is not ordered today.   Recent Labs: 07/05/2020: ALT 17 08/02/2020: BUN 14; Creatinine, Ser 0.95; Hemoglobin 13.0; Platelets 466; Potassium 4.5; Sodium 141    Lipid Panel    Component Value Date/Time   CHOL 196 04/19/2020 1144   TRIG 154 (H) 04/19/2020 1144   HDL 35 (L) 04/19/2020 1144   CHOLHDL 5.6 (H) 04/19/2020 1144   CHOLHDL 6.1 12/11/2015 0246   VLDL 20 12/11/2015 0246   LDLCALC 133 (H) 04/19/2020 1144    Additional labs from Dr Arelia Sneddon: dated 02/07/20: D dimer normal. BNP 54. Glucose 118. UA 7.4. otherwise CMET normal. CBC normal Hgb 14.7. TFTS normal. Cholesterol 207, triglycerides 137, HDL 40, LDL 142.  Urinalysis normal.  Wt Readings from Last 3 Encounters:  10/31/20 (!) 314 lb 12.8 oz (142.8 kg)  10/21/20 (!) 316 lb (143.3 kg)  10/02/20 (!) 316 lb 6.4 oz (143.5 kg)      Other studies Reviewed: Additional studies/ records that were reviewed today include:   Myoview 12/26/15: Study Highlights   Nuclear stress EF:  62%.  There was no ST segment deviation noted during stress.  No T wave inversion was noted during stress.  The study is normal.  This is a low risk study.  The left ventricular ejection fraction is normal (55-65%).   Echo 12/11/15: Study Conclusions   - Left ventricle: The cavity  size was normal. Wall thickness was  increased in a pattern of severe LVH. Systolic function was  normal. The estimated ejection fraction was in the range of 60%  to 65%. Doppler parameters are consistent with abnormal left  ventricular relaxation (grade 1 diastolic dysfunction). The E/e&'  ratio is between 8-15, suggesting indeterminate LV filling  pressure.  - Aortic valve: Trileaflet. Sclerosis without stenosis. There was  no regurgitation.  - Mitral valve: Calcified annulus. There was trivial regurgitation.  - Left atrium: The atrium was mildly dilated.  - Tricuspid valve: There was trivial regurgitation.  - Pulmonary arteries: PA peak pressure: 32 mm Hg (S).  - Inferior vena cava: The vessel was dilated. The respirophasic  diameter changes were blunted (< 50%), consistent with elevated  central venous pressure.   Cardiac cath 04/23/20:  LEFT HEART CATH AND CORONARY ANGIOGRAPHY  Conclusion    Mid RCA lesion is 25% stenosed.  Mid LAD lesion is 25% stenosed.  The left ventricular systolic function is normal.  LV end diastolic pressure is mildly elevated.  The left ventricular ejection fraction is greater than 65% by visual estimate.   1. Nonobstructive CAD 2. Normal LV function 3. Mildly elevated LVEDP 4. Hypertensive 5. No significant right innominate or subclavian pressure gradient.   Plan: will optimize BP control. Will arrange PFTs, Echo, Carotid and LE arterial dopplers. She is intolerant of beta blockers. Will initiate amlodipine in addition to enalapril for BP control.   Echo 04/30/20: IMPRESSIONS    1. Left ventricular ejection fraction, by estimation, is  55 to 60%. The  left ventricle has normal function. The left ventricle has no regional  wall motion abnormalities. There is mild concentric left ventricular  hypertrophy. Left ventricular diastolic  parameters are indeterminate. Elevated left ventricular end-diastolic  pressure.  2. Right ventricular systolic function is normal. The right ventricular  size is normal. Tricuspid regurgitation signal is inadequate for assessing  PA pressure.  3. Left atrial size was mildly dilated.  4. Right atrial size was mildly dilated.  5. The mitral valve is normal in structure. Trivial mitral valve  regurgitation. No evidence of mitral stenosis. Moderate mitral annular  calcification.  6. The aortic valve is tricuspid. There is mild calcification of the  aortic valve. There is mild thickening of the aortic valve. Aortic valve  regurgitation is not visualized. Mild aortic valve sclerosis is present,  with no evidence of aortic valve  stenosis.  7. The inferior vena cava is normal in size with greater than 50%  respiratory variability, suggesting right atrial pressure of 3 mmHg.   Conclusion(s)/Recommendation(s): Otherwise normal echocardiogram, with  minor abnormalities described in the report  LE arterial dopplers:  Aorta-Iliacs: Technically challenging and limited evaluation.   Monophasic waveforms in the bilateral external iliac arteries suggest  severe stenosis and/or occlusion of the bilateral common iliac arteries.  Unable to directly visualize the common iliac arteries due to patient body  habitus and overlying bowel gas.   Right: Near normal infrainguinal examination. No evidence of focal  stenosis.   Left: Near normal infrainguinal examination. No evidence of focal  stenosis.   Summary:  Right: Resting right ankle-brachial index indicates moderate right lower  extremity arterial disease. The right toe-brachial index is abnormal.   Left: Resting left ankle-brachial index  indicates moderate left lower  extremity arterial disease. The left toe-brachial index is abnormal.   Carotid dopplers 05/16/20: Summary:  Right Carotid: Velocities in the right ICA are consistent with a 1-39%  stenosis.   Left Carotid: Velocities in the left ICA are consistent with a 40-59%  stenosis.   Vertebrals: Left vertebral artery demonstrates antegrade flow. Right  vertebral        artery demonstrates no discernable flow.  Subclavians: Left subclavian artery was stenotic. Right subclavian artery  flow        was disturbed.   *See table(s) above for measurements and observations.  Suggest follow up study in 12 months.   ASSESSMENT AND PLAN:  1.  Severe dyspnea secondary to severe asthma. Improved symptoms on Trelegy. Fortunately she has no significant obstructive CAD. 2. HTN. Continue enalapril and amlodipine. BP is OK.  3. HLD. increase Crestor 5 mg twice a week. Continue to titrate as tolerated. If unable to tolerate may need to consider PCSK 9 inhibitor.  4. Morbid obesity. Weight management center referral.   5. PAD. 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 Some bilateral subclavian disease but no gradient on the right with cath. Seen by [facility]. Consider surgery pending efforts at weight loss to reduce risk.  66. Strong family history of premature vascular disease. 7. Former tobacco abuse.  8. Multiple colon polyps. S/p removal 9. Left breast carcinoma. S/p bilateral lumpectomies. She has deferred RT. 10. Nonobstructive CAD.  11. Endometrial CA stage 1A s/p total hysterectomy/BSO 12.  OSA per pulmonary.   Current medicines are reviewed at length with the patient today.  The patient does not have concerns regarding medicines.  The following changes have been made:  See above  Labs/ tests ordered today include:   No orders of the defined types were placed in this encounter.    Disposition:   FU with me 6  months. Signed, Peter Martinique, MD  10/31/2020 2:25 PM    [facility] [address] [place], [place], [address] Phone [phone], Fax [phone]

## 2020-10-31 ENCOUNTER — Ambulatory Visit (INDEPENDENT_AMBULATORY_CARE_PROVIDER_SITE_OTHER): Payer: 59 | Admitting: Cardiology

## 2020-10-31 ENCOUNTER — Encounter: Payer: Self-pay | Admitting: Cardiology

## 2020-10-31 ENCOUNTER — Other Ambulatory Visit: Payer: Self-pay

## 2020-10-31 VITALS — BP 140/75 | HR 93 | Ht 69.0 in | Wt 314.8 lb

## 2020-10-31 DIAGNOSIS — R0602 Shortness of breath: Secondary | ICD-10-CM

## 2020-10-31 DIAGNOSIS — I10 Essential (primary) hypertension: Secondary | ICD-10-CM | POA: Diagnosis not present

## 2020-10-31 DIAGNOSIS — E7800 Pure hypercholesterolemia, unspecified: Secondary | ICD-10-CM | POA: Diagnosis not present

## 2020-10-31 DIAGNOSIS — I739 Peripheral vascular disease, unspecified: Secondary | ICD-10-CM

## 2020-11-01 NOTE — Progress Notes (Signed)
[name]   [phone]) [phone] [phone]   Clinic Follow up Note   Patient Care Team: [name], MD as PCP - General (Family Medicine) Martinique, Peter M, MD as PCP - Cardiology (Cardiology) Mauro Kaufmann, RN as Oncology Nurse Navigator Rockwell Germany, RN as Oncology Nurse Navigator [name], MD as Consulting Physician (General Surgery) Truitt Merle, MD as Consulting Physician (Hematology) Eppie Gibson, MD as Attending Physician (Radiation Oncology)   I connected with Kelli Horn on 11/06/2020 at  2:00 PM EST by telephone visit and verified that I am speaking with the correct person using two identifiers.  I discussed the limitations, risks, security and privacy concerns of performing an evaluation and management service by telephone and the availability of in person appointments. I also discussed with the patient that there may be a patient responsible charge related to this service. The patient expressed understanding and agreed to proceed.   Other persons participating in the visit and their role in the encounter:  None  Patient's location:  Her Home  Provider's location:  My Office   CHIEF COMPLAINT: F/u of left breast Cancer   SUMMARY OF ONCOLOGIC HISTORY: Oncology History Overview Note  Cancer Staging Ductal carcinoma in situ (DCIS) of left breast Staging form: Breast, AJCC 8th Edition - Clinical stage from 05/22/2020: Stage 0 (cTis (DCIS), cN0, cM0, ER+, PR+) - Unsigned    Ductal carcinoma in situ (DCIS) of left breast  04/26/2020 Mammogram   IMPRESSION: 1. There is a possible subtle distortion in the lateral right breast without a definite sonographic correlate.   2.  No evidence of right axillary lymphadenopathy.   3. There are 2 adjacent abutting masses in the left breast at 3:30, 7cmfn which are indeterminate. One of these is an irregular mass measuring 9 x 3 x 5 mm. An adjacent abutting mass measures 4 x 3 x 4  mm.   4.  No evidence of left axillary lymphadenopathy.     05/10/2020 Initial Biopsy   Diagnosis 1. Breast, left, needle core biopsy, 3:30 o'clock - CARCINOMA INVOLVING PAPILLARY LESION; SEE COMMENT. 2. Breast, right, needle core biopsy, lower, outer quadrant - COMPLEX SCLEROSING LESION WITH CALCIFICATIONS. Microscopic Comment 1. The differential diagnosis includes invasive papillary carcinoma and solid papillary carcinoma. Immunohistochemistry for E-cadherin is positive. CK5/6 is negative. SMM-1 is positive, Calponin is equivocal and P63 is negative.   1. PROGNOSTIC INDICATORS Results: IMMUNOHISTOCHEMICAL AND MORPHOMETRIC ANALYSIS PERFORMED MANUALLY Estrogen Receptor: 100%, POSITIVE, STRONG STAINING INTENSITY Progesterone Receptor: 100%, POSITIVE, STRONG STAINING INTENSITY   05/17/2020 Initial Diagnosis   Ductal carcinoma in situ (DCIS) of left breast   06/07/2020 Surgery   BILATERAL BREAST LUMPECTOMY WITH RADIOACTIVE SEED AND LEFT SENTINEL LYMPH NODE BIOPSY by Dr Marlou Starks   06/07/2020 Pathology Results   FINAL MICROSCOPIC DIAGNOSIS:   A. 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  - Margins of resection are not involved (Closest margin: 2 mm, medial).  - Biopsy site.  - See oncology table.   B. BREAST, RIGHT, LUMPECTOMY:  - Fibrocystic change.  - Biopsy site.   C. SENTINEL LYMPH NODE, LEFT #1, BIOPSY:  - One lymph node, negative for carcinoma (0/1).    Endometrial cancer ([place])  06/27/2020 Procedure   Colonoscopy by Dr Michail Sermon  IMPRESSION Preparation of the colon was fair. - One 7 mm polyp in the cecum, removed with a hot snare. Resected and retrieved. - Seven 3 to 7 mm  polyps in the sigmoid colon, removed with a hot snare. Resected and retrieved. - One 9 mm polyp in the descending colon, removed with a hot snare. Resected and retrieved. - Three 1 to 4 mm polyps in the sigmoid colon, removed with a cold biopsy  forceps. Resected and retrieved. - Two 3 to 7 mm polyps in the rectum, removed with a hot snare. Resected and retrieved. - Diverticulosis in the sigmoid colon. - Medium-sized lipoma in the ascending colon. - Internal hemorrhoids. - Non-thrombosed external hemorrhoids and hypertrophied anal papilla(e) found on perianal exam.    FINAL MICROSCOPIC DIAGNOSIS:   A. COLON, CECUM/SIGMOID, POLYPECTOMY:  - Tubular adenoma without high-grade dysplasia or malignancy  - Hyperplastic polyp(s)   B. COLON, DESCENDING, POLYPECTOMY:  - Hyperplastic polyp   C. COLON, SIGMOID, POLYPECTOMY:  - Hyperplastic polyp(s)   D. RECTUM, POLYPECTOMY:  - Hyperplastic polyp(s)    07/11/2020 Surgery   XI ROBOTIC ASSISTED TOTAL HYSTERECTOMY WITH BILATERAL SALPINGO OOPHORECTOMY by Dr [name]   07/11/2020 Initial Diagnosis   Endometrial cancer ([place])   07/11/2020 Initial Biopsy   FINAL MICROSCOPIC DIAGNOSIS:   A. UTERUS, CERVIX, BILATERAL FALLOPIAN TUBES AND OVARIES:  Uterus:  -  Endometrioid carcinoma, FIGO grade 1, partially involving endometrial  polyp  -  Leiomyoma (4.0 cm; largest)  -  See oncology table and comment below   Cervix:  -  Benign endocervical polyp  -  No carcinoma identified   Bilateral Ovaries:  -  No carcinoma identified   Bilateral Fallopian tubes:  -  No carcinoma identified    Mismatch Repair Protein (IHC)   SUMMARY INTERPRETATION: NORMAL    IHC EXPRESSION RESULTS   TEST           RESULT  MLH1:          Preserved nuclear expression  MSH2:          Preserved nuclear expression  MSH6:          Preserved nuclear expression  PMS2:          Preserved nuclear expression       CURRENT THERAPY:  Surveillance while PENDING AI  INTERVAL HISTORY:  Kelli Horn presents for a virtual follow up. She notes she was offered bypass surgery for blockage in her legs. However she was advised to lose weight before surgery. She started with [facility] to help her lose weight.     REVIEW OF SYSTEMS:   Constitutional: Denies fevers, chills or abnormal weight loss Eyes: Denies blurriness of vision Ears, nose, mouth, throat, and face: Denies mucositis or sore throat Respiratory: Denies cough, dyspnea or wheezes Cardiovascular: Denies palpitation, chest discomfort or lower extremity swelling Gastrointestinal:  Denies nausea, heartburn or change in bowel habits Skin: Denies abnormal skin rashes Lymphatics: Denies new lymphadenopathy or easy bruising Neurological:Denies numbness, tingling or new weaknesses Behavioral/Psych: Mood is stable, no new changes  All other systems were reviewed with the patient and are negative.  MEDICAL HISTORY:  Past Medical History:  Diagnosis Date  . Acid reflux   . Anxiety   . Asthma   . Atherosclerosis   . Back pain   . Breast cancer ([place]) 05/2020   left breast DCIS  . Chest pain   . Chronic kidney disease    Right side question infarction  . Constipation   . COPD (chronic obstructive pulmonary disease) ([place])   . Diabetes mellitus without complication (H[facility])    no meds  . Edema  of both lower extremities   . GERD (gastroesophageal reflux disease)   . Hyperlipidemia   . Hypertension   . Joint pain   . Kidney problem   . Obesity   . PAD (peripheral artery disease) ([place])   . Peripheral vascular disease ([place])    severe athrosclerosis  . Sleep apnea     no cpap at this time. Sleep study done Jun 20 2020  . SOB (shortness of breath)   . Swallowing difficulty     SURGICAL HISTORY: Past Surgical History:  Procedure Laterality Date  . ABDOMINAL AORTOGRAM W/LOWER EXTREMITY Bilateral 08/08/2020   Procedure: ABDOMINAL AORTOGRAM W/LOWER EXTREMITY;  Surgeon: [name], MD;  Location: [facility];  Service: Cardiovascular;  Laterality: Bilateral;  . APPENDECTOMY    . BIOPSY  06/27/2020   Procedure: BIOPSY;  Surgeon: [name], MD;  Location: [facility];   Service: Endoscopy;;  . BREAST LUMPECTOMY WITH RADIOACTIVE SEED AND SENTINEL LYMPH NODE BIOPSY Bilateral 06/07/2020   Procedure: BILATERAL BREAST LUMPECTOMY WITH RADIOACTIVE SEED AND LEFT SENTINEL LYMPH NODE BIOPSY;  Surgeon: [name], MD;  Location: [facility];  Service: General;  Laterality: Bilateral;  PEC BLOCK  . COLONOSCOPY WITH PROPOFOL N/A 06/27/2020   Procedure: COLONOSCOPY WITH PROPOFOL;  Surgeon: [name], MD;  Location: [facility];  Service: Endoscopy;  Laterality: N/A;  . LEFT HEART CATH AND CORONARY ANGIOGRAPHY N/A 04/23/2020   Procedure: LEFT HEART CATH AND CORONARY ANGIOGRAPHY;  Surgeon: Martinique, Peter M, MD;  Location: [facility];  Service: Cardiovascular;  Laterality: N/A;  . POLYPECTOMY  06/27/2020   Procedure: POLYPECTOMY;  Surgeon: [name], MD;  [facility]: [facility];  Service: Endoscopy;;  . ROBOTIC ASSISTED TOTAL HYSTERECTOMY WITH BILATERAL SALPINGO OOPHERECTOMY N/A 07/11/2020   Procedure: XI ROBOTIC ASSISTED TOTAL HYSTERECTOMY WITH BILATERAL SALPINGO OOPHORECTOMY;  Surgeon: [name], MD;  Location: [facility];  Service: Gynecology;  Laterality: N/A;    I have reviewed the social history and family history with the patient and they are unchanged from previous note.  ALLERGIES:  is allergic to beta adrenergic blockers, carvedilol, peanut-containing drug products, and statins.  MEDICATIONS:  Current Outpatient Medications  Medication Sig Dispense Refill  . albuterol (VENTOLIN HFA) 108 (90 Base) MCG/ACT inhaler Inhale 2 puffs into the lungs every 6 (six) hours as needed for wheezing or shortness of breath. 18 g 0  . amLODipine (NORVASC) 5 MG tablet Take 5 mg by mouth daily.  180 tablet 3  . aspirin EC 81 MG tablet Take 81 mg by mouth daily. Swallow whole.    . enalapril (VASOTEC) 10 MG tablet Take 1.5 tablets (15 mg total) by mouth 2 (two) times daily. 60 tablet 0  . hydrOXYzine (VISTARIL) 25 MG capsule Take 25 mg by mouth 4  (four) times daily as needed.    Vladimir Faster Glycol-Propyl Glycol 0.4-0.3 % SOLN Place 1 drop into both eyes 3 (three) times daily as needed (dry eyes).     . rosuvastatin (CRESTOR) 5 MG tablet Take 5 mg twice a week (Patient taking differently: Take 5 mg by mouth every Saturday.) 30 tablet 6   Current Facility-Administered Medications  Medication Dose Route Frequency Provider Last Rate Last Admin  . sodium chloride flush (NS) 0.9 % injection 3 mL  3 mL Intravenous Q12H [name], MD        PHYSICAL EXAMINATION: ECOG PERFORMANCE STATUS: 1 - Symptomatic but completely ambulatory  No vitals taken today, Exam not performed today  LABORATORY DATA:  I have reviewed the data as listed CBC Latest Ref Rng & Units 08/02/2020 07/05/2020 05/22/2020  WBC 3.4 - 10.8 x10E3/uL 13.8(H) 9.5 11.5(H)  Hemoglobin 11.1 - 15.9 g/dL 13.0 13.3 13.7  Hematocrit 34.0 - 46.6 % 39.3 41.5 42.1  Platelets 150 - 450 x10E3/uL 466(H) 389 415(H)     CMP Latest Ref Rng & Units 11/05/2020 08/02/2020 07/05/2020  Glucose 65 - 99 mg/dL 120(H) 101(H) 117(H)  BUN 8 - 27 mg/dL 13 14 16   Creatinine 0.57 - 1.00 mg/dL 0.85 0.95 0.80  Sodium 134 - 144 mmol/L 140 141 140  Potassium 3.5 - 5.2 mmol/L 4.8 4.5 4.1  Chloride 96 - 106 mmol/L 99 102 104  CO2 20 - 29 mmol/L 22 24 25   Calcium 8.7 - 10.3 mg/dL 9.3 9.4 9.1  Total Protein 6.0 - 8.5 g/dL 7.1 - 7.3  Total Bilirubin 0.0 - 1.2 mg/dL 0.2 - 0.4  Alkaline Phos 44 - 121 IU/L 102 - 78  AST 0 - 40 IU/L 15 - 16  ALT 0 - 32 IU/L 15 - 17      RADIOGRAPHIC STUDIES: I have personally reviewed the radiological images as listed and agreed with the findings in the report. No results found.   ASSESSMENT & PLAN:  Kelli Horn is a 65 y.o. female with    1.Left Breast DCIS, ER+/PR+ -She was diagnosed in 05/2020. She was found to have 2 small lesions in her left breast with carcinoma involving papillary lesions, but no definitive type and staging. Given symptomatic  right breast changes her right breast was also biopsied and path showed complex sclerotic lesion with calcifications. -She underwent b/l breast lumpectomy with Dr Marlou Starks on 06/07/20. Surgical path showed 1.3cm Solid papillary carcinoma without invasive carcinoma completely removed with clear margins. Her right breast path was negative for malignancy. -Her DCIS was cured by complete surgical resection. Any form of adjuvant therapy is preventive. She opted to not proceed with Adjuvant Radiation.  -Given her strongly positive ER and PR, I do recommend antiestrogen therapy with AI or Tamoxifen for 5 years, to reduce her risk of future breast cancer. She is still considering and would like to re-evaluate after her vascular surgery this year.  -She is overall stable. She is working on weight loss before proceed with her Vascular surgery.  -Will f/u with her in 1 year. Next Mammogram in 03/2021. DEXA in 01/2021.    2. Endometrial Cancer, T1a, Grade I, MSI-stable  -She underwent BSO and total hysterectomy by Dr [name] on 07/11/20. Surgical path showed Endometrioid carcinoma, FIGO grade 1, partially involving endometrial polyp. -She did not require chemotherapy. She will continue to F/u with Dr [name].   3. Comorbidites: HTN, DM, DDD/Arthritis, HLD, CAD s/p cardiac cath in 03/2020 -Managed on Amlodipine, baby aspirin, enalapril, Trelegy, Hydroxyzine, Nitroglycerin -Her arthritis is managed on ibuprofen and Robaxin. -She underwent abdominal aortogram with LE for stent placement on 08/08/20 due to arthrosclerosis.  -She notes complete vascular blockage in her LE. She plans to undergo bypass surgery, but will not be offered until goal weight is reached. Weight is currently being managed with [facility].   4. Vaginal dryness  -Her Gyn prescribed her steroid cream. This is not estrogen containing, she is fine to continue.    PLAN: -She will proceed her vascular surgery in late  2022, she is working on weight loss now  -DEXA on 01/29/21 -Mammogram in 03/2021 -Lab and F/u in 1  year.    No problem-specific Assessment & Plan notes found for this encounter.   Orders Placed This Encounter  Procedures  . MM DIAG BREAST TOMO BILATERAL    Standing Status:   Future    Standing Expiration Date:   11/06/2021    Order Specific Question:   Reason for Exam (SYMPTOM  OR DIAGNOSIS REQUIRED)    Answer:   screening    Order Specific Question:   Preferred imaging location?    Answer:   [facility]   I discussed the assessment and treatment plan with the patient. The patient was provided an opportunity to ask questions and all were answered. The patient agreed with the plan and demonstrated an understanding of the instructions.  The patient was advised to call back or seek an in-person evaluation if the symptoms worsen or if the condition fails to improve as anticipated.  The total time spent in the appointment was 15 minutes.    Truitt Merle, MD 11/06/2020   I, Joslyn Devon, am acting as scribe for Truitt Merle, MD.   I have reviewed the above documentation for accuracy and completeness, and I agree with the above.

## 2020-11-05 ENCOUNTER — Encounter (INDEPENDENT_AMBULATORY_CARE_PROVIDER_SITE_OTHER): Payer: Self-pay | Admitting: Bariatrics

## 2020-11-05 ENCOUNTER — Other Ambulatory Visit: Payer: Self-pay

## 2020-11-05 ENCOUNTER — Ambulatory Visit (INDEPENDENT_AMBULATORY_CARE_PROVIDER_SITE_OTHER): Payer: 59 | Admitting: Bariatrics

## 2020-11-05 VITALS — BP 147/80 | HR 75 | Temp 97.9°F | Ht 69.0 in | Wt 310.0 lb

## 2020-11-05 DIAGNOSIS — F3289 Other specified depressive episodes: Secondary | ICD-10-CM | POA: Diagnosis not present

## 2020-11-05 DIAGNOSIS — R0602 Shortness of breath: Secondary | ICD-10-CM | POA: Diagnosis not present

## 2020-11-05 DIAGNOSIS — Z9189 Other specified personal risk factors, not elsewhere classified: Secondary | ICD-10-CM

## 2020-11-05 DIAGNOSIS — Z0289 Encounter for other administrative examinations: Secondary | ICD-10-CM

## 2020-11-05 DIAGNOSIS — R5383 Other fatigue: Secondary | ICD-10-CM | POA: Diagnosis not present

## 2020-11-05 DIAGNOSIS — J449 Chronic obstructive pulmonary disease, unspecified: Secondary | ICD-10-CM

## 2020-11-05 DIAGNOSIS — I10 Essential (primary) hypertension: Secondary | ICD-10-CM

## 2020-11-05 DIAGNOSIS — R7303 Prediabetes: Secondary | ICD-10-CM

## 2020-11-05 DIAGNOSIS — Z1331 Encounter for screening for depression: Secondary | ICD-10-CM

## 2020-11-05 DIAGNOSIS — Z6841 Body Mass Index (BMI) 40.0 and over, adult: Secondary | ICD-10-CM

## 2020-11-06 ENCOUNTER — Inpatient Hospital Stay: Payer: 59 | Attending: Hematology | Admitting: Hematology

## 2020-11-06 ENCOUNTER — Encounter: Payer: Self-pay | Admitting: Hematology

## 2020-11-06 DIAGNOSIS — C541 Malignant neoplasm of endometrium: Secondary | ICD-10-CM

## 2020-11-06 DIAGNOSIS — D0512 Intraductal carcinoma in situ of left breast: Secondary | ICD-10-CM | POA: Diagnosis not present

## 2020-11-06 LAB — COMPREHENSIVE METABOLIC PANEL
ALT: 15 IU/L (ref 0–32)
AST: 15 IU/L (ref 0–40)
Albumin/Globulin Ratio: 1.4 (ref 1.2–2.2)
Albumin: 4.1 g/dL (ref 3.8–4.8)
Alkaline Phosphatase: 102 IU/L (ref 44–121)
BUN/Creatinine Ratio: 15 (ref 12–28)
BUN: 13 mg/dL (ref 8–27)
Bilirubin Total: 0.2 mg/dL (ref 0.0–1.2)
CO2: 22 mmol/L (ref 20–29)
Calcium: 9.3 mg/dL (ref 8.7–10.3)
Chloride: 99 mmol/L (ref 96–106)
Creatinine, Ser: 0.85 mg/dL (ref 0.57–1.00)
Globulin, Total: 3 g/dL (ref 1.5–4.5)
Glucose: 120 mg/dL — ABNORMAL HIGH (ref 65–99)
Potassium: 4.8 mmol/L (ref 3.5–5.2)
Sodium: 140 mmol/L (ref 134–144)
Total Protein: 7.1 g/dL (ref 6.0–8.5)
eGFR: 77 mL/min/{1.73_m2} (ref >59)

## 2020-11-06 LAB — INSULIN, RANDOM: INSULIN: 31.3 u[IU]/mL — ABNORMAL HIGH (ref 2.6–24.9)

## 2020-11-06 LAB — TSH: TSH: 1.32 u[IU]/mL (ref 0.450–4.500)

## 2020-11-06 LAB — T3: T3, Total: 118 ng/dL (ref 71–180)

## 2020-11-06 LAB — HEMOGLOBIN A1C
Est. average glucose Bld gHb Est-mCnc: 134 mg/dL
Hgb A1c MFr Bld: 6.3 % — ABNORMAL HIGH (ref 4.8–5.6)

## 2020-11-06 LAB — T4, FREE: Free T4: 1.21 ng/dL (ref 0.82–1.77)

## 2020-11-06 NOTE — Progress Notes (Signed)
Dear Dr. [name],   Thank you for referring Kelli Horn to our clinic. The following note includes my evaluation and treatment recommendations.  Chief Complaint:   OBESITY Kelli Horn (MR# [id number]) is a 65 y.o. female who presents for evaluation and treatment of obesity and related comorbidities. Current BMI is Body mass index is 45.78 kg/m. Kelli Horn has been struggling with her weight for many years and has been unsuccessful in either losing weight, maintaining weight loss, or reaching her healthy weight goal.  Kelli Horn is currently in the action stage of change and ready to dedicate time achieving and maintaining a healthier weight. Kelli Horn is interested in becoming our patient and working on intensive lifestyle modifications including (but not limited to) diet and exercise for weight loss.  Kelli Horn says she likes to cook and denies obstacles to cooking.  She states that she snacks after dinner.  Kelli Horn's habits were reviewed today and are as follows: Her family eats meals together, she thinks her family will eat healthier with her, her desired weight loss is 135 pounds, she started gaining weight after she stopped smoking, her heaviest weight ever was her current weight, she craves chicken, chocolate, and coffee, she snacks frequently in the evenings, she frequently makes poor food choices, she frequently eats larger portions than normal and she struggles with emotional eating.  Depression Screen [name]'s Food and Mood (modified PHQ-9) score was 21.  Depression screen [facility] 2/9 11/05/2020  Decreased Interest 3  Down, Depressed, Hopeless 3  PHQ - 2 Score 6  Altered sleeping 3  Tired, decreased energy 3  Change in appetite 3  Feeling bad or failure about yourself  1  Trouble concentrating 0  Moving slowly or fidgety/restless 3  Suicidal thoughts 2  PHQ-9 Score 21  Difficult doing work/chores Extremely dIfficult   Subjective:   1. Other fatigue Myleigh denies  daytime somnolence and denies waking up still tired. Patent has a history of symptoms of snoring. Kelli Horn generally gets 7 hours of sleep per night, and states that she has generally restful sleep. Snoring is present. Apneic episodes are present. Epworth Sleepiness Score is 3.  2. SOB (shortness of breath) on exertion Kelli Horn notes increasing shortness of breath with exercising and seems to be worsening over time with weight gain. She notes getting out of breath sooner with activity than she used to. This has gotten worse recently. Kelli Horn denies shortness of breath at rest or orthopnea.  3. Primary hypertension Controlled.  Review: taking medications as instructed, no medication side effects noted, no chest pain on exertion, no dyspnea on exertion, no swelling of ankles.    BP Readings from Last 3 Encounters:  11/05/20 (!) 147/80  10/31/20 140/75  10/21/20 132/84   4. Prediabetes Kelli Horn has a diagnosis of prediabetes based on her elevated HgA1c and was informed this puts her at greater risk of developing diabetes. She continues to work on diet and exercise to decrease her risk of diabetes. She denies nausea or hypoglycemia.  She is on no medication for this.  A1c is 6.1.  Lab Results  Component Value Date   HGBA1C 6.3 (H) 11/05/2020   Lab Results  Component Value Date   INSULIN 31.3 (H) 11/05/2020   5. Chronic obstructive pulmonary disease, unspecified COPD type ([place]) With asthma.  She is taking Trelegy Ellipta and has an albuterol inhaler.  6. Other depression, with emotional eating Kelli Horn is struggling with emotional eating and using food for comfort to the extent  that it is negatively impacting her health. She has been working on behavior modification techniques to help reduce her emotional eating and has been unsuccessful. She shows no sign of suicidal or homicidal ideations.  PHQ-9 is 21  7. At risk for activity intolerance Kelli Horn is at risk for activity intolerance due to  COPD/asthma and obesity.  Assessment/Plan:   1. Other fatigue Kelli Horn does feel that her weight is causing her energy to be lower than it should be. Fatigue may be related to obesity, depression or many other causes. Labs will be ordered, and in the meanwhile, Kelli Horn will focus on self care including making healthy food choices, increasing physical activity and focusing on stress reduction.  - EKG 12-Lead - T3 - T4, free - TSH  2. SOB (shortness of breath) on exertion Kelli Horn does feel that she gets out of breath more easily that she used to when she exercises. Kelli Horn's shortness of breath appears to be obesity related and exercise induced. She has agreed to work on weight loss and gradually increase exercise to treat her exercise induced shortness of breath. Will continue to monitor closely.  3. Primary hypertension Kelli Horn is working on healthy weight loss and exercise to improve blood pressure control. We will watch for signs of hypotension as she continues her lifestyle modifications.  Continue medications.  No added salt in diet.  4. Prediabetes Kelli Horn will continue to work on weight loss, exercise, and decreasing simple carbohydrates to help decrease the risk of diabetes.  Decrease carbohydrates, increase protein and healthy fats.  Will check insulin level and A1c today.  - Comprehensive metabolic panel - Hemoglobin A1c - Insulin, random  5. Chronic obstructive pulmonary disease, unspecified COPD type (Kelli Horn) Continue medications.  6. Other depression, with emotional eating Behavior modification techniques were discussed today to help Kelli Horn deal with her emotional/non-hunger eating behaviors.  Orders and follow up as documented in patient record.  Handout on emotional eating provided today.  7. At risk for activity intolerance Kelli Horn was given approximately 15 minutes of exercise intolerance counseling today. She is 65 y.o. female and has risk factors exercise intolerance including  obesity. We discussed intensive lifestyle modifications today with an emphasis on specific weight loss instructions and strategies. Kelli Horn will slowly increase activity as tolerated.  Repetitive spaced learning was employed today to elicit superior memory formation and behavioral change.  8. Class 3 severe obesity due to excess calories with serious comorbidity and body mass index (BMI) of 45.0 to 49.9 in adult [facility])  Zyrah is currently in the action stage of change and her goal is to continue with weight loss efforts. I recommend Shaketa begin the structured treatment plan as follows:  She has agreed to the Category 1 Plan.  She will work on meal planning, intentional eating, and decreasing portion sizes.  Labs from 08/02/2020 were reviewed (CMP, CBC, glucose).  Exercise goals: No exercise has been prescribed at this time.   Behavioral modification strategies: increasing lean protein intake, decreasing simple carbohydrates, increasing vegetables, increasing water intake, decreasing eating out, no skipping meals, meal planning and cooking strategies, keeping healthy foods in the home and planning for success.  She was informed of the importance of frequent follow-up visits to maximize her success with intensive lifestyle modifications for her multiple health conditions. She was informed we would discuss her lab results at her next visit unless there is a critical issue that needs to be addressed sooner. Dylan agreed to keep her next visit at the agreed upon  time to discuss these results.  Objective:   Blood pressure (!) 147/80, pulse 75, temperature 97.9 F (36.6 C), height 5\' 9"  (1.753 m), weight (!) 310 lb (140.6 kg), last menstrual period 12/09/2013, SpO2 95 %. Body mass index is 45.78 kg/m.  EKG: Normal sinus rhythm, rate 73 bpm.  Indirect Calorimeter completed today shows a VO2 of 202 and a REE of 1757.  Her calculated basal metabolic rate is 4854 thus her basal metabolic rate is worse  than expected.  General: Cooperative, alert, well developed, in no acute distress. HEENT: Conjunctivae and lids unremarkable. Cardiovascular: Regular rhythm.  Lungs: Normal work of breathing. Neurologic: No focal deficits.   Lab Results  Component Value Date   CREATININE 0.85 11/05/2020   BUN 13 11/05/2020   NA 140 11/05/2020   K 4.8 11/05/2020   CL 99 11/05/2020   CO2 22 11/05/2020   Lab Results  Component Value Date   ALT 15 11/05/2020   AST 15 11/05/2020   ALKPHOS 102 11/05/2020   BILITOT 0.2 11/05/2020   Lab Results  Component Value Date   HGBA1C 6.3 (H) 11/05/2020   HGBA1C 6.1 (H) 07/05/2020   HGBA1C 6.5 (H) 12/11/2015   Lab Results  Component Value Date   INSULIN 31.3 (H) 11/05/2020   Lab Results  Component Value Date   TSH 1.320 11/05/2020   Lab Results  Component Value Date   CHOL 196 04/19/2020   HDL 35 (L) 04/19/2020   LDLCALC 133 (H) 04/19/2020   TRIG 154 (H) 04/19/2020   CHOLHDL 5.6 (H) 04/19/2020   Lab Results  Component Value Date   WBC 13.8 (H) 08/02/2020   HGB 13.0 08/02/2020   HCT 39.3 08/02/2020   MCV 81 08/02/2020   PLT 466 (H) 08/02/2020   Attestation Statements:   Reviewed by clinician on day of visit: allergies, medications, problem list, medical history, surgical history, family history, social history, and previous encounter notes.  I, Water quality scientist, CMA, am acting as Location manager for [employer], DO  I have reviewed the above documentation for accuracy and completeness, and I agree with the above. Jearld Lesch, DO

## 2020-11-07 ENCOUNTER — Encounter (INDEPENDENT_AMBULATORY_CARE_PROVIDER_SITE_OTHER): Payer: Self-pay | Admitting: Bariatrics

## 2020-11-07 ENCOUNTER — Telehealth: Payer: Self-pay | Admitting: Hematology

## 2020-11-07 NOTE — Telephone Encounter (Signed)
Scheduled appt per 3/09 los - mailed letter with appt date and time

## 2020-11-08 ENCOUNTER — Other Ambulatory Visit: Payer: Self-pay | Admitting: Family Medicine

## 2020-11-10 ENCOUNTER — Other Ambulatory Visit: Payer: Self-pay | Admitting: Family Medicine

## 2020-11-19 ENCOUNTER — Ambulatory Visit (INDEPENDENT_AMBULATORY_CARE_PROVIDER_SITE_OTHER): Payer: 59 | Admitting: Bariatrics

## 2020-11-19 ENCOUNTER — Encounter (INDEPENDENT_AMBULATORY_CARE_PROVIDER_SITE_OTHER): Payer: Self-pay | Admitting: Bariatrics

## 2020-11-19 ENCOUNTER — Other Ambulatory Visit: Payer: Self-pay

## 2020-11-19 VITALS — BP 149/77 | HR 83 | Temp 97.4°F | Ht 69.0 in | Wt 310.0 lb

## 2020-11-19 DIAGNOSIS — Z6841 Body Mass Index (BMI) 40.0 and over, adult: Secondary | ICD-10-CM

## 2020-11-19 DIAGNOSIS — Z9189 Other specified personal risk factors, not elsewhere classified: Secondary | ICD-10-CM

## 2020-11-19 DIAGNOSIS — I10 Essential (primary) hypertension: Secondary | ICD-10-CM

## 2020-11-19 DIAGNOSIS — R7303 Prediabetes: Secondary | ICD-10-CM

## 2020-11-19 DIAGNOSIS — F5089 Other specified eating disorder: Secondary | ICD-10-CM

## 2020-11-19 MED ORDER — BUPROPION HCL ER (SR) 150 MG PO TB12: 150.0000 mg | ORAL_TABLET | Freq: Every day | ORAL | 0 refills | Status: DC

## 2020-11-19 MED ORDER — METFORMIN HCL 500 MG PO TABS: 500.0000 mg | ORAL_TABLET | Freq: Every day | ORAL | 0 refills | Status: DC

## 2020-11-21 ENCOUNTER — Telehealth: Payer: Self-pay | Admitting: *Deleted

## 2020-11-21 NOTE — Telephone Encounter (Signed)
Called and spoke with patient, she states she has not been using the CPAP machine and will discuss it during her visit on 11/21/20.  Nothing further needed.

## 2020-11-22 ENCOUNTER — Other Ambulatory Visit: Payer: Self-pay

## 2020-11-22 ENCOUNTER — Encounter: Payer: Self-pay | Admitting: Primary Care

## 2020-11-22 ENCOUNTER — Ambulatory Visit (INDEPENDENT_AMBULATORY_CARE_PROVIDER_SITE_OTHER): Payer: 59 | Admitting: Primary Care

## 2020-11-22 VITALS — BP 152/90 | HR 86 | Temp 97.8°F | Ht 69.0 in | Wt 311.5 lb

## 2020-11-22 DIAGNOSIS — J4540 Moderate persistent asthma, uncomplicated: Secondary | ICD-10-CM

## 2020-11-22 DIAGNOSIS — G4733 Obstructive sleep apnea (adult) (pediatric): Secondary | ICD-10-CM

## 2020-11-22 DIAGNOSIS — G47411 Narcolepsy with cataplexy: Secondary | ICD-10-CM

## 2020-11-22 MED ORDER — MONTELUKAST SODIUM 10 MG PO TABS: 10.0000 mg | ORAL_TABLET | Freq: Every day | ORAL | 5 refills | Status: DC

## 2020-11-22 NOTE — Patient Instructions (Addendum)
Asthma: Continue Trelegy 1 puff daily in the morning Add Singulair 10mg  at bedtime  Sleep apnea: Continue weight loss efforts, very encouraging you are working with healthy weight and wellness  Sleep sleeping position is best if able  Do not drive if experiencing excessive daytime fatigue/somnolence  Orders: Discontinue CPAP  Follow-up: 6 months with Dr. Erin Fulling or [name]    Asthma, Adult  Asthma is a long-term (chronic) condition that causes recurrent episodes in which the airways become tight and narrow. The airways are the passages that lead from the nose and mouth down into the lungs. Asthma episodes, also called asthma attacks, can cause coughing, wheezing, shortness of breath, and chest pain. The airways can also fill with mucus. During an attack, it can be difficult to breathe. Asthma attacks can range from minor to life threatening. Asthma cannot be cured, but medicines and lifestyle changes can help control it and treat acute attacks. What are the causes? This condition is believed to be caused by inherited (genetic) and environmental factors, but its exact cause is not known. There are many things that can bring on an asthma attack or make asthma symptoms worse (triggers). Asthma triggers are different for each person. Common triggers include:  Mold.  Dust.  Cigarette smoke.  Cockroaches.  Things that can cause allergy symptoms (allergens), such as animal dander or pollen from trees or grass.  Air pollutants such as household cleaners, wood smoke, smog, or Advertising account planner.  Cold air, weather changes, and winds (which increase molds and pollen in the air).  Strong emotional expressions such as crying or laughing hard.  Stress.  Certain medicines (such as aspirin) or types of medicines (such as beta-blockers).  Sulfites in foods and drinks. Foods and drinks that may contain sulfites include dried fruit, potato chips, and sparkling grape juice.  Infections or  inflammatory conditions such as the flu, a cold, or inflammation of the nasal membranes (rhinitis).  Gastroesophageal reflux disease (GERD).  Exercise or strenuous activity. What are the signs or symptoms? Symptoms of this condition may occur right after asthma is triggered or many hours later. Symptoms include:  Wheezing. This can sound like whistling when you breathe.  Excessive nighttime or early morning coughing.  Frequent or severe coughing with a common cold.  Chest tightness.  Shortness of breath.  Tiredness (fatigue) with minimal activity. How is this diagnosed? This condition is diagnosed based on:  Your medical history.  A physical exam.  Tests, which may include: ? Lung function studies and pulmonary studies (spirometry). These tests can evaluate the flow of air in your lungs. ? Allergy tests. ? Imaging tests, such as X-rays. How is this treated? There is no cure for this condition, but treatment can help control your symptoms. Treatment for asthma usually involves:  Identifying and avoiding your asthma triggers.  Using medicines to control your symptoms. Generally, two types of medicines are used to treat asthma: ? Controller medicines. These help prevent asthma symptoms from occurring. They are usually taken every day. ? Fast-acting reliever or rescue medicines. These quickly relieve asthma symptoms by widening the narrow and tight airways. They are used as needed and provide short-term relief.  Using supplemental oxygen. This may be needed during a severe episode.  Using other medicines, such as: ? Allergy medicines, such as antihistamines, if your asthma attacks are triggered by allergens. ? Immune medicines (immunomodulators). These are medicines that help control the immune system.  Creating an asthma action plan. An asthma action plan  is a written plan for managing and treating your asthma attacks. This plan includes: ? A list of your asthma triggers  and how to avoid them. ? Information about when medicines should be taken and when their dosage should be changed. ? Instructions about using a device called a peak flow meter. A peak flow meter measures how well the lungs are working and the severity of your asthma. It helps you monitor your condition. Follow these instructions at home: Controlling your home environment Control your home environment in the following ways to help avoid triggers and prevent asthma attacks:  Change your heating and air conditioning filter regularly.  Limit your use of fireplaces and wood stoves.  Get rid of pests (such as roaches and mice) and their droppings.  Throw away plants if you see mold on them.  Clean floors and dust surfaces regularly. Use unscented cleaning products.  Try to have someone else vacuum for you regularly. Stay out of rooms while they are being vacuumed and for a short while afterward. If you vacuum, use a dust mask from a hardware store, a double-layered or microfilter vacuum cleaner bag, or a vacuum cleaner with a HEPA filter.  Replace carpet with wood, tile, or vinyl flooring. Carpet can trap dander and dust.  Use allergy-proof pillows, mattress covers, and box spring covers.  Keep your bedroom a trigger-free room.  Avoid pets and keep windows closed when allergens are in the air.  Wash beddings every week in hot water and dry them in a dryer.  Use blankets that are made of polyester or cotton.  Clean bathrooms and kitchens with bleach. If possible, have someone repaint the walls in these rooms with mold-resistant paint. Stay out of the rooms that are being cleaned and painted.  Wash your hands often with soap and water. If soap and water are not available, use hand sanitizer.  Do not allow anyone to smoke in your home. General instructions  Take over-the-counter and prescription medicines only as told by your health care provider. ? Speak with your health care provider  if you have questions about how or when to take the medicines. ? Make note if you are requiring more frequent dosages.  Do not use any products that contain nicotine or tobacco, such as cigarettes and e-cigarettes. If you need help quitting, ask your health care provider. Also, avoid being exposed to secondhand smoke.  Use a peak flow meter as told by your health care provider. Record and keep track of the readings.  Understand and use the asthma action plan to help minimize, or stop an asthma attack, without needing to seek medical care.  Make sure you stay up to date on your yearly vaccinations as told by your health care provider. This may include vaccines for the flu and pneumonia.  Avoid outdoor activities when allergen counts are high and when air quality is low.  Wear a ski mask that covers your nose and mouth during outdoor winter activities. Exercise indoors on cold days if you can.  Warm up before exercising, and take time for a cool-down period after exercise.  Keep all follow-up visits as told by your health care provider. This is important. Where to find more information  For information about asthma, turn to the Centers for Disease Control and Prevention at http://www.clark.net/  For air quality information, turn to [employer] at https://www.miller-reyes.info/ Contact a health care provider if:  You have wheezing, shortness of breath, or a cough even while you are  taking medicine to prevent attacks.  The mucus you cough up (sputum) is thicker than usual.  Your sputum changes from clear or white to yellow, green, gray, or bloody.  Your medicines are causing side effects, such as a rash, itching, swelling, or trouble breathing.  You need to use a reliever medicine more than 2-3 times a week.  Your peak flow reading is still at 50-79% of your personal best after following your action plan for 1 hour.  You have a fever. Get help right away if:  You are getting worse and do not respond to  treatment during an asthma attack.  You are short of breath when at rest or when doing very little physical activity.  You have difficulty eating, drinking, or talking.  You have chest pain or tightness.  You develop a fast heartbeat or palpitations.  You have a bluish color to your lips or fingernails.  You are light-headed or dizzy, or you faint.  Your peak flow reading is less than 50% of your personal best.  You feel too tired to breathe normally. Summary  Asthma is a long-term (chronic) condition that causes recurrent episodes in which the airways become tight and narrow. These episodes can cause coughing, wheezing, shortness of breath, and chest pain.  Asthma cannot be cured, but medicines and lifestyle changes can help control it and treat acute attacks.  Make sure you understand how to avoid triggers and how and when to use your medicines.  Asthma attacks can range from minor to life threatening. Get help right away if you have an asthma attack and do not respond to treatment with your usual rescue medicines. This information is not intended to replace advice given to you by your health care provider. Make sure you discuss any questions you have with your health care provider. Document Revised: 05/17/2020 Document Reviewed: 12/20/2019 Elsevier Patient Education  2021 [place].     Montelukast Tablets What is this medicine? MONTELUKAST (mon te LOO kast) is used to prevent and treat the symptoms of asthma. It is also used to treat allergies. Do not use for an acute asthma attack. This medicine may be used for other purposes; ask your health care provider or pharmacist if you have questions. COMMON BRAND NAME(S): Singulair What should I tell my health care provider before I take this medicine? They need to know if you have any of these conditions:  liver disease  an unusual or allergic reaction to montelukast, other medicines, foods, dyes, or  preservatives  pregnant or trying to get pregnant  breast-feeding How should I use this medicine? This medicine should be given by mouth. Follow the directions on the prescription label. Take this medicine at the same time every day. You may take this medicine with or without meals. Do not chew the tablets. Do not stop taking your medicine unless your doctor tells you to. Talk to your pediatrician regarding the use of this medicine in children. Special care may be needed. While this drug may be prescribed for children as young as 68 years of age for selected conditions, precautions do apply. Overdosage: If you think you have taken too much of this medicine contact a poison control center or emergency room at once. NOTE: This medicine is only for you. Do not share this medicine with others. What if I miss a dose? If you miss a dose, skip it. Take your next dose at the normal time. Do not take extra or 2 doses at the  same time to make up for the missed dose. What may interact with this medicine?  anti-infectives like rifampin and rifabutin  medicines for seizures like phenytoin, phenobarbital, and carbamazepine This list may not describe all possible interactions. Give your health care provider a list of all the medicines, herbs, non-prescription drugs, or dietary supplements you use. Also tell them if you smoke, drink alcohol, or use illegal drugs. Some items may interact with your medicine. What should I watch for while using this medicine? Visit your doctor or health care professional for regular checks on your progress. Tell your doctor or health care professional if your allergy or asthma symptoms do not improve. Take your medicine even when you do not have symptoms. Do not stop taking any of your medicine(s) unless your doctor tells you to. If you have asthma, talk to your doctor about what to do in an acute asthma attack. Always have your inhaled rescue medicine for asthma attacks with  you. Patients and their families should watch for new or worsening thoughts of suicide or depression. Also watch for sudden changes in feelings such as feeling anxious, agitated, panicky, irritable, hostile, aggressive, impulsive, severely restless, overly excited and hyperactive, or not being able to sleep. Any worsening of mood or thoughts of suicide or dying should be reported to your health care professional right away. What side effects may I notice from receiving this medicine? Side effects that you should report to your doctor or health care professional as soon as possible:  allergic reactions like skin rash or hives, or swelling of the face, lips, or tongue  breathing problems  changes in emotions or moods  confusion  depressed mood  fever or infection  hallucinations  joint pain  painful lumps under the skin  pain, tingling, numbness in the hands or feet  redness, blistering, peeling, or loosening of the skin, including inside the mouth  restlessness  seizures  sleep walking  signs and symptoms of infection like fever; chills; cough; sore throat; flu-like illness  signs and symptoms of liver injury like dark yellow or brown urine; general ill feeling or flu-like symptoms; light-colored stools; loss of appetite; nausea; right upper belly pain; unusually weak or tired; yellowing of the eyes or skin  sinus pain or swelling  stuttering  suicidal thoughts or other mood changes  tremors  trouble sleeping  uncontrolled muscle movements  unusual bleeding or bruising  vivid or bad dreams Side effects that usually do not require medical attention (report to your doctor or health care professional if they continue or are bothersome):  dizziness  drowsiness  headache  runny nose  stomach upset  tiredness This list may not describe all possible side effects. Call your doctor for medical advice about side effects. You may report side effects to FDA at  1-800-FDA-1088. Where should I keep my medicine? Keep out of the reach of children. Store at room temperature between 15 and 30 degrees C (59 and 86 degrees F). Protect from light and moisture. Keep this medicine in the original bottle. Throw away any unused medicine after the expiration date. NOTE: This sheet is a summary. It may not cover all possible information. If you have questions about this medicine, talk to your doctor, pharmacist, or health care provider.  2021 Elsevier/Gold Standard (2020-07-13 15:22:47)

## 2020-11-22 NOTE — Progress Notes (Signed)
@Patient  ID: Kelli Horn, female    DOB: 1955-12-12, 65 y.o.   MRN: [id number]  Chief Complaint  Patient presents with  . Follow-up    Pt here for follow up---she stated that she is not happy with her care here.  She is not using her cpap.  She stated that she is now on metformin since her A1C is elevated and she feels that this is related to the trelegy.  She has also gained 17 lbs with this medication.  She stated that she has not seen JD since December.      Referring provider: [name], MD  HPI: 65 year old female, former smoker quit in 2005 (20 pack year hx). PMH significant for asthma, OSA, narcolepsy with cataplexy, CAD, HTN, dyslipidemia, breast cancer, endometrial cancer, morbid obesity. Patient of Dr. Erin Fulling, last seen on 08/07/20. Following with Dr. [name] for sleep apnea. HST showed mild OSA. Started on auto CPAP 5-12cm h20 with nasal pillow/mask. Narcolepsy not a definitive diagnosis as she never underwent MSLT. Holding off on any further testing. 06/21/20 HST >> AHI 7.7, SpO2 low 84%   Previous [facility] pulmonary function: 10/21/2020 Patient presents today for acute visit/sleep apnea. She feels her CPAP machine is giving off too much pressure and needs to be adjusted. She has not been able to wear CPAP mask or tried putting it on due to pressure being too strong. She does not do well with any type of mask over her face, she has full nasal mask but would prefer the pillows. She has a Personnel officer, received in December 2021. Her DME company is [employer]. No download available, no data on SD card.   11/22/2020 - Interim hx  Patient presents today for follow-up sleep apnea. Patient has mild OSA, she was started on CPAP back in December 2021 by Dr. [name] for hypersomnolence and fatigue. States that she falls asleep every day around 5pm. She will sleep for 1 hour and then stays up until 4am. She reports having experienced "sleep attacks" while driving, after meals and after exercise.   She has not been wearing her CPAP. She does not want to lose her machine, however, she has not even tried wearing her mask even once since she received it in December. She originally felt her mask was giving off too much pressure so we decreased her CPAP setting to the lowest pressure possible but she would not try wearing. She claustropobic. She has been told that she has narcolepsy but has not had MLST. She declines further testing at the moment and agrees with giving back her CPAP machine to DME company.  Former smoker with asthma. She has severe obstruction and mild diffusion defect on PFTs. She was started on Trelegy inhaler back in September 2021 for asthma. She was told she would only be on steriod inhaler for 6 weeks. She followed up with him in December 2021. She was kept on triple therapy. A1C is up and her weight is up 17 lbs. She is newly on metformin.   Allergies  Allergen Reactions  . Beta Adrenergic Blockers Other (See Comments)    Trigger Asthma  . Carvedilol Shortness Of Breath    Reacts with albuterol, worsens asthma symptoms when taken together   . Peanut-Containing Drug Products Other (See Comments)     Trigger Asthma attack  . Statins Other (See Comments)    Immunization History  Administered Date(s) Administered  . PFIZER(Purple Top)SARS-COV-2 Vaccination 06/10/2020, 07/01/2020    Past Medical  History:  Diagnosis Date  . Acid reflux   . Anxiety   . Asthma   . Atherosclerosis   . Back pain   . 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 Chest pain   . Chronic kidney disease    Right side question infarction  . Constipation   . COPD (chronic obstructive pulmonary disease) ([place])   . Diabetes mellitus without complication ([facility])    no meds  . Edema of both lower extremities   . GERD (gastroesophageal reflux disease)   . Hyperlipidemia   . Hypertension   . Joint pain   . Kidney problem   . Obesity   . PAD (peripheral artery disease) ([place])   .  Peripheral vascular disease ([place])    severe athrosclerosis  . Sleep apnea     no cpap at this time. Sleep study done Jun 20 2020  . SOB (shortness of breath)   . Swallowing difficulty     Tobacco History: Social History   Tobacco Use  Smoking Status Former Smoker  . Packs/day: 2.00  . Years: 10.00  . Pack years: 20.00  . Types: Cigarettes  . Quit date: 04/16/2004  . Years since quitting: 16.6  Smokeless Tobacco Never Used   Counseling given: Not Answered   Outpatient Medications Prior to Visit  Medication Sig Dispense Refill  . albuterol (VENTOLIN HFA) 108 (90 Base) MCG/ACT inhaler TAKE 2 PUFFS BY MOUTH EVERY 6 HOURS AS NEEDED FOR WHEEZE OR SHORTNESS OF BREATH 18 each 1  . amLODipine (NORVASC) 5 MG tablet Take 5 mg by mouth daily.  180 tablet 3  . aspirin EC 81 MG tablet Take 81 mg by mouth daily. Swallow whole.    Marland Kitchen buPROPion (WELLBUTRIN SR) 150 MG 12 hr tablet Take 1 tablet (150 mg total) by mouth daily. 30 tablet 0  . enalapril (VASOTEC) 10 MG tablet TAKE 1.5 TABLETS BY MOUTH 2 TIMES DAILY. 60 tablet 0  . Fluticasone-Umeclidin-Vilant (TRELEGY ELLIPTA) 100-62.5-25 MCG/INH AEPB Inhale into the lungs.    . hydrOXYzine (VISTARIL) 25 MG capsule Take 25 mg by mouth 4 (four) times daily as needed.    . metFORMIN (GLUCOPHAGE) 500 MG tablet Take 1 tablet (500 mg total) by mouth daily with lunch. 30 tablet 0  . Polyethyl Glycol-Propyl Glycol 0.4-0.3 % SOLN Place 1 drop into both eyes 3 (three) times daily as needed (dry eyes).     . rosuvastatin (CRESTOR) 5 MG tablet Take 5 mg twice a week (Patient taking differently: Take 5 mg by mouth every Saturday.) 30 tablet 6   Facility-Administered Medications Prior to Visit  Medication Dose Route Frequency Provider Last Rate Last Admin  . sodium chloride flush (NS) 0.9 % injection 3 mL  3 mL Intravenous Q12H [name], MD          Review of Systems  Review of Systems  Constitutional: Positive for fatigue.  HENT: Negative.    Respiratory: Negative.   Cardiovascular: Negative.      Physical Exam  BP (!) 152/90   Pulse 86   Temp 97.8 F (36.6 C) (Tympanic)   Ht 5\' 9"  (1.753 m)   Wt (!) 311 lb 8 oz (141.3 kg)   LMP 12/09/2013   SpO2 96%   BMI 46.00 kg/m  Physical Exam Constitutional:      General: She is not in acute distress.    Appearance: Normal appearance. She is obese. She is not ill-appearing.  HENT:  Mouth/Throat:     Comments: Deferred d/t masking Cardiovascular:     Rate and Rhythm: Normal rate and regular rhythm.  Pulmonary:     Effort: Pulmonary effort is normal.     Breath sounds: Normal breath sounds.  Musculoskeletal:        General: Normal range of motion.  Skin:    General: Skin is warm and dry.  Neurological:     General: No focal deficit present.     Mental Status: She is alert and oriented to person, place, and time. Mental status is at baseline.  Psychiatric:        Mood and Affect: Mood normal.        Behavior: Behavior normal.        Thought Content: Thought content normal.        Judgment: Judgment normal.      Lab Results:  CBC    Component Value Date/Time   WBC 13.8 (H) 08/02/2020 1635   WBC 9.5 07/05/2020 1222   RBC 4.83 08/02/2020 1635   RBC 4.89 07/05/2020 1222   HGB 13.0 08/02/2020 1635   HCT 39.3 08/02/2020 1635   PLT 466 (H) 08/02/2020 1635   MCV 81 08/02/2020 1635   MCH 26.9 08/02/2020 1635   MCH 27.2 07/05/2020 1222   MCHC 33.1 08/02/2020 1635   MCHC 32.0 07/05/2020 1222   RDW 12.4 08/02/2020 1635   LYMPHSABS 2.2 05/22/2020 1206   LYMPHSABS 2.7 04/19/2020 1144   MONOABS 1.0 05/22/2020 1206   EOSABS 0.3 05/22/2020 1206   EOSABS 0.4 04/19/2020 1144   BASOSABS 0.1 05/22/2020 1206   BASOSABS 0.1 04/19/2020 1144    BMET    Component Value Date/Time   NA 140 11/05/2020 1018   K 4.8 11/05/2020 1018   CL 99 11/05/2020 1018   CO2 22 11/05/2020 1018   GLUCOSE 120 (H) 11/05/2020 1018   GLUCOSE 117 (H) 07/05/2020 1222   BUN 13  11/05/2020 1018   CREATININE 0.85 11/05/2020 1018   CREATININE 0.82 05/22/2020 1206   CALCIUM 9.3 11/05/2020 1018   GFRNONAA 64 08/02/2020 1635   GFRNONAA >60 07/05/2020 1222   GFRNONAA >60 05/22/2020 1206   GFRAA 74 08/02/2020 1635   GFRAA >60 05/22/2020 1206    BNP    Component Value Date/Time   BNP 260.0 (H) 12/11/2015 0023    ProBNP No results found for: PROBNP  Imaging: No results found.   Assessment & Plan:   Asthma - Persistent asthma with fixed obstruction. She has severe obstruction and mild diffusion defect on PFTs - Started on Trelegy 100 by Dr. Erin Fulling in September 2021. I would recommend patient stay on triple therapy, it is unlikely that ICS component is the sole contributor to her weight gain or elevated glucose levels. I am afraid if we took her off inhaled steriods her asthma would exacerbate leading to hospitalization. We could consider changing Trelegy to LABA/LAMA combination such as Anoro and using flovent as needed. Patient would like to follow-up with Dr. Hector Shade for sleep and pulmonary management.   OSA (obstructive sleep apnea) - Patient has not tried wearing CPAP since she received machine in December 2021. It is very unlikely she will feel comfortable enough to wear mack d/t claustrophobia  - Since her OSA is mild, recommend discontinuing CPAP and sending machine back to [facility] to encourage weight loss, she is established with healthy weight and wellness   Narcolepsy with cataplexy - Patient was told by neurologist that  she had narcolepsy, there does not appear to have been any formal MSLT done. She declined further testing at this time. Advised she not drive if experiencing excessive daytime fatigue/somolence.    Martyn Ehrich, NP 11/25/2020

## 2020-11-24 ENCOUNTER — Encounter (INDEPENDENT_AMBULATORY_CARE_PROVIDER_SITE_OTHER): Payer: Self-pay | Admitting: Bariatrics

## 2020-11-25 ENCOUNTER — Encounter: Payer: Self-pay | Admitting: Primary Care

## 2020-11-25 NOTE — Assessment & Plan Note (Signed)
-   Patient was told by neurologist that she had narcolepsy, there does not appear to have been any formal MSLT done. She declined further testing at this time. Advised she not drive if experiencing excessive daytime fatigue/somolence.

## 2020-11-25 NOTE — Assessment & Plan Note (Addendum)
-   Persistent asthma with fixed obstruction. She has severe obstruction and mild diffusion defect on PFTs - Started on Trelegy 100 by Dr. Erin Fulling in September 2021. I would recommend patient stay on triple therapy, it is unlikely that ICS component is the sole contributor to her weight gain or elevated glucose levels. I am afraid if we took her off inhaled steriods her asthma would exacerbate leading to hospitalization. We could consider changing Trelegy to LABA/LAMA combination such as Anoro and using flovent as needed. Patient would like to follow-up with Dr. Hector Shade for sleep and pulmonary management.

## 2020-11-25 NOTE — Assessment & Plan Note (Signed)
-   Patient has not tried wearing CPAP since she received machine in December 2021. It is very unlikely she will feel comfortable enough to wear mack d/t claustrophobia  - Since her OSA is mild, recommend discontinuing CPAP and sending machine back to [facility] to encourage weight loss, she is established with healthy weight and wellness

## 2020-11-26 NOTE — Progress Notes (Signed)
Chief Complaint:   OBESITY Kelli Horn is here to discuss her progress with her obesity treatment plan along with follow-up of her obesity related diagnoses. Kelli Horn is on the Category 1 Plan and states she is following her eating plan approximately 20% of the time. Kelli Horn states she is walking for 20 minutes 7 times per week.  Today's visit was #: 2 Starting weight: 310 lbs Starting date: 11/05/2020 Today's weight: 310 lbs Today's date: 11/19/2020 Total lbs lost to date: 0 Total lbs lost since last in-office visit: 0  Interim History: Kelli Horn's weight remains the same.  She states that she was unable to adhere to the 1000 calorie diet, but has been eating about 1500 calories.  She is under a lot of stress.  Subjective:   1. Prediabetes Kelli Horn has a diagnosis of prediabetes based on her elevated HgA1c and was informed this puts her at greater risk of developing diabetes. She continues to work on diet and exercise to decrease her risk of diabetes. She denies nausea or hypoglycemia.  She is on no medication.  A1c 6.3, insulin 31.3.  Lab Results  Component Value Date   HGBA1C 6.3 (H) 11/05/2020   Lab Results  Component Value Date   INSULIN 31.3 (H) 11/05/2020   2. Primary hypertension Reasonably well controlled.  Systolic significantly elevated today.  Review: taking medications as instructed, no medication side effects noted, no chest pain on exertion, no dyspnea on exertion, no swelling of ankles.  Taking Norvasc.    BP Readings from Last 3 Encounters:  11/22/20 (!) 152/90  11/19/20 (!) 149/77  11/05/20 (!) 147/80   3. Other disorder of eating She is on no medications for this.  4. At risk for nausea Kelli Horn is at risk for nausea due to starting metformin.  Assessment/Plan:   1. Prediabetes Kelli Horn will continue to work on weight loss, exercise, and decreasing simple carbohydrates to help decrease the risk of diabetes.  Start metformin 500 mg daily, as per below.  - Start  metFORMIN (GLUCOPHAGE) 500 MG tablet; Take 1 tablet (500 mg total) by mouth daily with lunch.  Dispense: 30 tablet; Refill: 0  2. Primary hypertension Kelli Horn is working on healthy weight loss and exercise to improve blood pressure control. We will watch for signs of hypotension as she continues her lifestyle modifications.  Continue medications.  No added dietary salt.  3. Other disorder of eating Start Wellbutrin 150 mg daily, as per below.  - Start buPROPion (WELLBUTRIN SR) 150 MG 12 hr tablet; Take 1 tablet (150 mg total) by mouth daily.  Dispense: 30 tablet; Refill: 0  4. At risk for nausea Kelli Horn was given approximately 15 minutes of nausea prevention counseling today. Kelli Horn is at risk for nausea due to her new or current medication. She was encouraged to titrate her medication slowly, make sure to stay hydrated, eat smaller portions throughout the day, and avoid high fat meals.   5. Class 3 severe obesity with serious comorbidity and body mass index (BMI) of 45.0 to 49.9 in adult, unspecified obesity type [facility])  Kelli Horn is currently in the action stage of change. As such, her goal is to continue with weight loss efforts. She has agreed to the Category 1 Plan.   She will work on meal planning and intentional eating.  Labs were reviewed, including CMP, A1c, insulin, and thyroid panel.  Exercise goals: Walking about 20 minutes per day.  Behavioral modification strategies: increasing lean protein intake, decreasing simple carbohydrates,  increasing vegetables, increasing water intake, decreasing eating out, no skipping meals, meal planning and cooking strategies, keeping healthy foods in the home and planning for success.  Kelli Horn has agreed to follow-up with our clinic in 2 weeks. She was informed of the importance of frequent follow-up visits to maximize her success with intensive lifestyle modifications for her multiple health conditions.   Objective:   Blood pressure  (!) 149/77, pulse 83, temperature (!) 97.4 F (36.3 C), height 5\' 9"  (1.753 m), weight (!) 310 lb (140.6 kg), last menstrual period 12/09/2013, SpO2 94 %. Body mass index is 45.78 kg/m.  General: Cooperative, alert, well developed, in no acute distress. HEENT: Conjunctivae and lids unremarkable. Cardiovascular: Regular rhythm.  Lungs: Normal work of breathing. Neurologic: No focal deficits.   Lab Results  Component Value Date   CREATININE 0.85 11/05/2020   BUN 13 11/05/2020   NA 140 11/05/2020   K 4.8 11/05/2020   CL 99 11/05/2020   CO2 22 11/05/2020   Lab Results  Component Value Date   ALT 15 11/05/2020   AST 15 11/05/2020   ALKPHOS 102 11/05/2020   BILITOT 0.2 11/05/2020   Lab Results  Component Value Date   HGBA1C 6.3 (H) 11/05/2020   HGBA1C 6.1 (H) 07/05/2020   HGBA1C 6.5 (H) 12/11/2015   Lab Results  Component Value Date   INSULIN 31.3 (H) 11/05/2020   Lab Results  Component Value Date   TSH 1.320 11/05/2020   Lab Results  Component Value Date   CHOL 196 04/19/2020   HDL 35 (L) 04/19/2020   LDLCALC 133 (H) 04/19/2020   TRIG 154 (H) 04/19/2020   CHOLHDL 5.6 (H) 04/19/2020   Lab Results  Component Value Date   WBC 13.8 (H) 08/02/2020   HGB 13.0 08/02/2020   HCT 39.3 08/02/2020   MCV 81 08/02/2020   PLT 466 (H) 08/02/2020   Attestation Statements:   Reviewed by clinician on day of visit: allergies, medications, problem list, medical history, surgical history, family history, social history, and previous encounter notes.  I, Water quality scientist, CMA, am acting as Location manager for [employer], DO  I have reviewed the above documentation for accuracy and completeness, and I agree with the above. Jearld Lesch, DO

## 2020-12-03 ENCOUNTER — Ambulatory Visit (INDEPENDENT_AMBULATORY_CARE_PROVIDER_SITE_OTHER): Payer: 59 | Admitting: Bariatrics

## 2020-12-03 ENCOUNTER — Other Ambulatory Visit: Payer: Self-pay

## 2020-12-03 ENCOUNTER — Encounter (INDEPENDENT_AMBULATORY_CARE_PROVIDER_SITE_OTHER): Payer: Self-pay | Admitting: Bariatrics

## 2020-12-03 VITALS — BP 147/86 | HR 84 | Temp 97.7°F | Ht 69.0 in | Wt 309.0 lb

## 2020-12-03 DIAGNOSIS — Z9189 Other specified personal risk factors, not elsewhere classified: Secondary | ICD-10-CM | POA: Diagnosis not present

## 2020-12-03 DIAGNOSIS — Z6841 Body Mass Index (BMI) 40.0 and over, adult: Secondary | ICD-10-CM

## 2020-12-03 DIAGNOSIS — I10 Essential (primary) hypertension: Secondary | ICD-10-CM | POA: Diagnosis not present

## 2020-12-03 DIAGNOSIS — F5089 Other specified eating disorder: Secondary | ICD-10-CM

## 2020-12-03 DIAGNOSIS — R7303 Prediabetes: Secondary | ICD-10-CM | POA: Diagnosis not present

## 2020-12-03 MED ORDER — METFORMIN HCL 500 MG PO TABS: 500.0000 mg | ORAL_TABLET | Freq: Every day | ORAL | 0 refills | Status: DC

## 2020-12-03 MED ORDER — BUPROPION HCL ER (SR) 150 MG PO TB12: 150.0000 mg | ORAL_TABLET | Freq: Every day | ORAL | 0 refills | Status: DC

## 2020-12-04 ENCOUNTER — Encounter (INDEPENDENT_AMBULATORY_CARE_PROVIDER_SITE_OTHER): Payer: Self-pay | Admitting: Bariatrics

## 2020-12-04 NOTE — Progress Notes (Signed)
Chief Complaint:   OBESITY Kelli Horn is here to discuss her progress with her obesity treatment plan along with follow-up of her obesity related diagnoses. Kelli Horn is on the Category 1 Plan and states she is following her eating plan approximately 0% of the time. Kelli Horn states she is walking 20 minutes 7 times per week.  Today's visit was #: 3 Starting weight: 310 lbs Starting date: 11/05/2020 Today's weight: 309 lbs Today's date: 12/03/2020 Total lbs lost to date: 1 Total lbs lost since last in-office visit: 1  Interim History: Kelli Horn is down 1 lb since her last visit. She is doing better with water and protein.  Subjective:   1. Essential hypertension Kelli Horn's BP is controlled.  BP Readings from Last 3 Encounters:  12/03/20 (!) 147/86  11/22/20 (!) 152/90  11/19/20 (!) 149/77    2. Other disorder of eating Kelli Horn's symptoms are improved with medication.  3. Pre-diabetes Kelli Horn's appetite is normal.  Lab Results  Component Value Date   HGBA1C 6.3 (H) 11/05/2020   Lab Results  Component Value Date   INSULIN 31.3 (H) 11/05/2020    4. At risk for activity intolerance Kelli Horn is at risk for exercise intolerance due to obesity.  Assessment/Plan:   1. Essential hypertension Kelli Horn is working on healthy weight loss and exercise to improve blood pressure control. We will watch for signs of hypotension as she continues her lifestyle modifications. Continue current treatment plan.  2. Other disorder of eating Behavior modification techniques were discussed today to help Kelli Horn deal with her emotional/non-hunger eating behaviors.  Orders and follow up as documented in patient record.   - buPROPion (WELLBUTRIN SR) 150 MG 12 hr tablet; Take 1 tablet (150 mg total) by mouth daily.  Dispense: 30 tablet; Refill: 0  3. Pre-diabetes Kelli Horn will continue to work on weight loss, exercise, and decreasing simple carbohydrates to help decrease the risk of diabetes.   - metFORMIN  (GLUCOPHAGE) 500 MG tablet; Take 1 tablet (500 mg total) by mouth daily with lunch.  Dispense: 90 tablet; Refill: 0  4. At risk for activity intolerance Kelli Horn was given approximately 15 minutes of exercise intolerance counseling today. She is 65 y.o. female and has risk factors exercise intolerance including obesity. We discussed intensive lifestyle modifications today with an emphasis on specific weight loss instructions and strategies. Kelli Horn will slowly increase activity as tolerated.  Repetitive spaced learning was employed today to elicit superior memory formation and behavioral change.  5. Obesity, current BMI 45 Kelli Horn is currently in the action stage of change. As such, her goal is to continue with weight loss efforts. She has agreed to the Category 1 Plan.   Kelli Horn will stay true to the meal plan at least 85% of the time.  Will have a shake in the mornings.  Exercise goals: As is  Behavioral modification strategies: increasing lean protein intake, decreasing simple carbohydrates, increasing vegetables, increasing water intake, decreasing eating out, no skipping meals, meal planning and cooking strategies, keeping healthy foods in the home and planning for success.  Kelli Horn has agreed to follow-up with our clinic in 2 weeks. She was informed of the importance of frequent follow-up visits to maximize her success with intensive lifestyle modifications for her multiple health conditions.   Objective:   Blood pressure (!) 147/86, pulse 84, temperature 97.7 F (36.5 C), height 5\' 9"  (1.753 m), weight (!) 309 lb (140.2 kg), last menstrual period 12/09/2013, SpO2 95 %. Body mass index is 45.63 kg/m.  General:  Cooperative, alert, well developed, in no acute distress. HEENT: Conjunctivae and lids unremarkable. Cardiovascular: Regular rhythm.  Lungs: Normal work of breathing. Neurologic: No focal deficits.   Lab Results  Component Value Date   CREATININE 0.85 11/05/2020   BUN 13  11/05/2020   NA 140 11/05/2020   K 4.8 11/05/2020   CL 99 11/05/2020   CO2 22 11/05/2020   Lab Results  Component Value Date   ALT 15 11/05/2020   AST 15 11/05/2020   ALKPHOS 102 11/05/2020   BILITOT 0.2 11/05/2020   Lab Results  Component Value Date   HGBA1C 6.3 (H) 11/05/2020   HGBA1C 6.1 (H) 07/05/2020   HGBA1C 6.5 (H) 12/11/2015   Lab Results  Component Value Date   INSULIN 31.3 (H) 11/05/2020   Lab Results  Component Value Date   TSH 1.320 11/05/2020   Lab Results  Component Value Date   CHOL 196 04/19/2020   HDL 35 (L) 04/19/2020   LDLCALC 133 (H) 04/19/2020   TRIG 154 (H) 04/19/2020   CHOLHDL 5.6 (H) 04/19/2020   Lab Results  Component Value Date   WBC 13.8 (H) 08/02/2020   HGB 13.0 08/02/2020   HCT 39.3 08/02/2020   MCV 81 08/02/2020   PLT 466 (H) 08/02/2020     Attestation Statements:   Reviewed by clinician on day of visit: allergies, medications, problem list, medical history, surgical history, family history, social history, and previous encounter notes.  Coral Ceo, am acting as Location manager for [employer], DO.  I have reviewed the above documentation for accuracy and completeness, and I agree with the above. Jearld Lesch, DO

## 2020-12-05 ENCOUNTER — Telehealth: Payer: Self-pay | Admitting: *Deleted

## 2020-12-05 NOTE — Telephone Encounter (Signed)
Patient called and rescheduled her appt from 5/2 to 5/5

## 2020-12-10 ENCOUNTER — Ambulatory Visit (HOSPITAL_COMMUNITY): Admission: RE | Admit: 2020-12-10 | Payer: 59 | Source: Ambulatory Visit

## 2020-12-10 ENCOUNTER — Ambulatory Visit: Payer: 59 | Admitting: Vascular Surgery

## 2020-12-22 ENCOUNTER — Other Ambulatory Visit: Payer: Self-pay | Admitting: Pulmonary Disease

## 2020-12-22 ENCOUNTER — Other Ambulatory Visit: Payer: Self-pay | Admitting: Family Medicine

## 2020-12-24 ENCOUNTER — Ambulatory Visit (INDEPENDENT_AMBULATORY_CARE_PROVIDER_SITE_OTHER): Payer: 59 | Admitting: Bariatrics

## 2020-12-30 ENCOUNTER — Ambulatory Visit: Payer: 59 | Admitting: Gynecologic Oncology

## 2020-12-30 ENCOUNTER — Ambulatory Visit: Payer: 59 | Admitting: Family Medicine

## 2020-12-31 ENCOUNTER — Encounter: Payer: Self-pay | Admitting: Gynecologic Oncology

## 2020-12-31 NOTE — Progress Notes (Signed)
Gynecologic Oncology Follow-up Note  Chief Complaint:  Chief Complaint  Patient presents with  . Endometrial cancer [facility])    Assessment/Plan:  [name]  is a 65 y.o.  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  Pathology revealed low risk factors for recurrence, therefore no adjuvant therapy is recommended according to NCCN guidelines.  I discussed risk for recurrence and typical symptoms encouraged her to notify us of these should they develop between visits.  I recommend she continue to have follow-up every 6 months for 5 years in accordance with NCCN guidelines. Those visits should include symptom assessment, physical exam and pelvic examination. Pap smears are not indicated or recommended in the routine surveillance of endometrial cancer.  She is a candidate for anti-estrogen therapy for her breast cancer as determined by her oncologist.   HPI: [name] is a 65 year old P2 who was seen in consultation at the request of Dr Paula Compton for evaluation and treatment of grade 1 endometrial cancer in the setting of morbid obesity with a BMI of 43 kg meters squared, recent diagnosis of left sided breast cancer, stage I, ER/PR positive, peripheral vascular disease, coronary artery disease, possible enteric ischemia, COPD, suspected sleep apnea, diabetes mellitus, and several other medical comorbidities.   Her symptoms began in July 2020 when she experienced an episode of vaginal spotting.  She again experienced vaginal spotting in February 2021.  She did not have a gynecologist.  She saw her primary care provider for a routine checkup in the summer 2021 and reported the symptom of vaginal spotting.  At that time her PCP recommend referral to a gynecologist for further work-up.  Work-up of symptoms included a transvaginal ultrasound, office endometrial biopsy, and CT abdomen and pelvis. Transvaginal US on  May 17, 2020 showed a uterus measuring 5.7 x 6.9 x 5.9 cm with an endometrial thickness of 18 mm.  The uterus contained a 4.2 x 3.3 cm fibroid.  The uterus was retroflexed. Endometrial sampling with office [place] biopsy was performed on May 29, 2020 and showed FIGO grade 1 endometrioid adenocarcinoma.   She was diagnosed with ER/PR positive stage I breast cancer in 2021 treated with lumpectomy and sentinel lymph node biopsy.  At the time of her endometrial cancer diagnosis she was awaiting definitive adjuvant radiation therapy to the left chest wall prior to her endometrial cancer diagnosis, but following this surgery she declined radiation despite the recommendation.  She needs to be on antiestrogen therapy for prolonged period of time.  With respect to her vascular disease, Dr. [name] had planned for percutaneous peripheral stenting of the iliacs.  Her mesenteric ischemic symptoms were being worked up with a planned colonoscopy and endoscopy.  Potentially she would have attempts at stenting of her SMA.  The interventional cardiologist were hoping to delay proceeding with endovascular procedures until after she had completed her surgical management for her cancers because she would need to be on prolonged anticoagulant therapy after her stenting procedures.  She was taking 81 mg aspirin.  With respect to her performance status she is unable to walk up a flight of stairs due to claudication pain and shortness of breath.  She denied chest pain but really stresses her self enough to exert what had previously resulted in angina type symptoms.  She had a left heart catheterization and coronary angiography with Dr. Martinique on 04/23/2020 which showed 25% stenosis of the mid  RCA, 25% stenosis of the mid LAD, normal left ventricular systolic function, mild elevation of left ventricular end-diastolic pressure.  65% estimated left ventricular ejection fraction.  No interventions were necessary at that  procedure.  Carotid duplex on 05/16/2020 showed no significant carotid stenosis but absent right vertebral flow and bilateral subclavian stenosis.  On 07/11/20 she underwent robotic assisted total hysterectomy for uterus greater than 250 g, BSO. Intraoperative findings were significant for a 12 cm fibroid uterus, normal tubes and ovaries, narrow urethral meatus, no gross extrauterine disease. Surgery was challenging due to body habitus but uncomplicated.  Final pathology revealed a grade 1 endometrioid endometrial adenocarcinoma within an endometrial polyp with less than 1 mm of myometrial invasion (total thickness 30 mm), no lymphovascular space invasion, negative adnexa and cervix.  Lymph nodes not sampled.  MMR intact.  She was determined to have low risk disease for recurrence and no adjuvant therapy was recommended.  Interval Hx:  She presented today for routine surveillance. She has no symptoms for recurrence.    Current Meds:  Outpatient Encounter Medications as of 01/02/2021  Medication Sig  . albuterol (VENTOLIN HFA) 108 (90 Base) MCG/ACT inhaler TAKE 2 PUFFS BY MOUTH EVERY 6 HOURS AS NEEDED FOR WHEEZE OR SHORTNESS OF BREATH  . amLODipine (NORVASC) 5 MG tablet Take 5 mg by mouth daily.   Marland Kitchen aspirin EC 81 MG tablet Take 81 mg by mouth daily. Swallow whole.  . enalapril (VASOTEC) 10 MG tablet TAKE 1.5 TABLETS BY MOUTH 2 TIMES DAILY.  . hydrOXYzine (VISTARIL) 25 MG capsule Take 25 mg by mouth 4 (four) times daily as needed.  . metFORMIN (GLUCOPHAGE) 500 MG tablet Take 1 tablet (500 mg total) by mouth daily with lunch.  Vladimir Faster Glycol-Propyl Glycol 0.4-0.3 % SOLN Place 1 drop into both eyes 3 (three) times daily as needed (dry eyes).   . rosuvastatin (CRESTOR) 5 MG tablet Take 5 mg twice a week (Patient taking differently: Take 5 mg by mouth every Saturday.)  . TRELEGY ELLIPTA 100-62.5-25 MCG/INH AEPB TAKE 1 PUFF BY MOUTH EVERY DAY  . FLOVENT HFA 110 MCG/ACT inhaler SMARTSIG:2 Puff(s)  Via Inhaler Twice Daily PRN (Patient not taking: Reported on 12/31/2020)  . predniSONE (DELTASONE) 10 MG tablet Take 10 mg by mouth daily as needed. For emergency (Patient not taking: Reported on 12/31/2020)  . [DISCONTINUED] buPROPion (WELLBUTRIN SR) 150 MG 12 hr tablet Take 1 tablet (150 mg total) by mouth daily.  . [DISCONTINUED] montelukast (SINGULAIR) 10 MG tablet Take 1 tablet (10 mg total) by mouth at bedtime.  . [DISCONTINUED] sodium chloride flush (NS) 0.9 % injection 3 mL    No facility-administered encounter medications on file as of 01/02/2021.    Allergy:  Allergies  Allergen Reactions  . Beta Adrenergic Blockers Other (See Comments)    Trigger Asthma  . Carvedilol Shortness Of Breath    Reacts with albuterol, worsens asthma symptoms when taken together   . Peanut-Containing Drug Products Other (See Comments)     Trigger Asthma attack  . Statins Other (See Comments)    Social Hx:   Social History   Socioeconomic History  . Marital status: Married    Spouse name: Not on file  . Number of children: 2  . Years of education: Not on file  . Highest education level: Not on file  Occupational History  . 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 Smoking status: Former Smoker    Packs/day: 2.00  Years: 10.00    Pack years: 20.00    Types: Cigarettes    Quit date: 04/16/2004    Years since quitting: 16.7  . Smokeless tobacco: Never Used  Vaping Use  . Vaping Use: Never used  Substance and Sexual Activity  . Alcohol use: No  . Drug use: No  . Sexual activity: Not Currently  Other Topics Concern  . Not on file  Social History Narrative  . Not on file   Social Determinants of Health   Financial Resource Strain: Not on file  Food Insecurity: Not on file  Transportation Needs: Not on file  Physical Activity: Not on file  Stress: Not on file  Social Connections: Not on file  Intimate Partner Violence: Not on file    Past Surgical Hx:   Past Surgical History:  Procedure Laterality Date  . ABDOMINAL AORTOGRAM W/LOWER EXTREMITY Bilateral 08/08/2020   Procedure: ABDOMINAL AORTOGRAM W/LOWER EXTREMITY;  Surgeon: [name], MD;  Location: [place] [facility];  Service: Cardiovascular;  Laterality: Bilateral;  . APPENDECTOMY    . BIOPSY  06/27/2020   Procedure: BIOPSY;  Surgeon: [name], MD;  Location: [facility];  Service: Endoscopy;;  . BREAST LUMPECTOMY WITH RADIOACTIVE SEED AND SENTINEL LYMPH NODE BIOPSY Bilateral 06/07/2020   Procedure: BILATERAL BREAST LUMPECTOMY WITH RADIOACTIVE SEED AND LEFT SENTINEL LYMPH NODE BIOPSY;  Surgeon: [name], MD;  Location: [place];  Service: General;  Laterality: Bilateral;  PEC BLOCK  . COLONOSCOPY WITH PROPOFOL N/A 06/27/2020   Procedure: COLONOSCOPY WITH PROPOFOL;  Surgeon: [name], MD;  Location: [facility];  Service: Endoscopy;  Laterality: N/A;  . LEFT HEART CATH AND CORONARY ANGIOGRAPHY N/A 04/23/2020   Procedure: LEFT HEART CATH AND CORONARY ANGIOGRAPHY;  Surgeon: Martinique, Peter M, MD;  Location: [place] [facility];  Service: Cardiovascular;  Laterality: N/A;  . POLYPECTOMY  06/27/2020   Procedure: POLYPECTOMY;  Surgeon: [name], MD;  Location: [facility];  Service: Endoscopy;;  . ROBOTIC ASSISTED TOTAL HYSTERECTOMY WITH BILATERAL SALPINGO OOPHERECTOMY N/A 07/11/2020   Procedure: XI ROBOTIC ASSISTED TOTAL HYSTERECTOMY WITH BILATERAL SALPINGO OOPHORECTOMY;  Surgeon: [name], MD;  Location: [facility];  Service: Gynecology;  Laterality: N/A;    Past Medical Hx:  Past Medical History:  Diagnosis Date  . Acid reflux   . Anxiety   . Asthma   . Atherosclerosis   . Back pain   . Breast cancer ([place]) 05/2020   left breast DCIS  . Chest pain   . Chronic kidney disease    Right side question infarction  . Constipation   . COPD (chronic obstructive pulmonary disease) ([place])   . Diabetes mellitus without complication (HCC)     no meds  . Edema of both lower extremities   . GERD (gastroesophageal reflux disease)   . Hyperlipidemia   . Hypertension   . Joint pain   . Kidney problem   . Obesity   . PAD (peripheral artery disease) ([place])   . Peripheral vascular disease ([place])    severe athrosclerosis  . Sleep apnea     no cpap at this time. Sleep study done Jun 20 2020  . SOB (shortness of breath)   . Swallowing difficulty     Past Gynecological History:  SVD x 2. Patient's last menstrual period was 12/09/2013.  Family Hx:  Family History  Problem Relation Age of Onset  . Heart attack Father 21  . Aneurysm Father   . Diabetes Father   .  Hypertension Father   . High Cholesterol Father   . Stroke Father   . Obesity Father   . AAA (abdominal aortic aneurysm) Father   . Breast cancer Maternal Aunt   . Lung cancer Mother   . Schizophrenia Mother   . Diabetes Other   . Heart disease Paternal Uncle   . Aneurysm Paternal Uncle     Review of Systems:  Constitutional  Feels well,    ENT Normal appearing ears and nares bilaterally Skin/Breast  No rash, sores, jaundice, itching, dryness Cardiovascular  + SOB, LE pain with ambulation, nocturnal leg pain  Pulmonary  + cough or wheeze.  Gastro Intestinal  + intermittent melena and diarrhea Genito Urinary  No frequency, urgency, dysuria, no bleeding Musculo Skeletal  No myalgia, arthralgia, joint swelling or pain  Neurologic  No weakness, numbness, change in gait,  Psychology  No depression, anxiety, insomnia.   Vitals:  Blood pressure (!) 144/58, pulse 91, temperature 97.6 F (36.4 C), temperature source Tympanic, resp. rate 16, height _0  (1.778 m), weight (!) 315 lb 6.4 oz (143.1 kg), last menstrual period 12/09/2013, SpO2 99 %.  Physical Exam: WD in NAD Neck  Supple NROM, without any enlargements.  Lymph Node Survey No cervical supraclavicular or inguinal adenopathy Cardiovascular  Well perfused peripheries Lungs  No increased  WOB Skin  No rash/lesions/breakdown  Psychiatry  Alert and oriented to person, place, and time  Abdomen  Normoactive bowel sounds, abdomen soft, non-tender and obese without evidence of hernia. Soft incisions Back No CVA tenderness Genito Urinary  Vulva/vagina: Normal external female genitalia.  No lesions. No discharge or bleeding.  Bladder/urethra:  No lesions or masses, well supported bladder  Vagina: cuff smooth, no blood, no lesions.  Rectal  deferred Extremities  No bilateral cyanosis, clubbing or edema.  Thereasa Solo, MD  01/02/2021, 3:33 PM

## 2021-01-02 ENCOUNTER — Inpatient Hospital Stay: Payer: 59 | Attending: Hematology | Admitting: Gynecologic Oncology

## 2021-01-02 ENCOUNTER — Other Ambulatory Visit: Payer: Self-pay

## 2021-01-02 VITALS — BP 144/58 | HR 91 | Temp 97.6°F | Resp 16 | Ht 70.0 in | Wt 315.4 lb

## 2021-01-02 DIAGNOSIS — Z90722 Acquired absence of ovaries, bilateral: Secondary | ICD-10-CM | POA: Insufficient documentation

## 2021-01-02 DIAGNOSIS — C50912 Malignant neoplasm of unspecified site of left female breast: Secondary | ICD-10-CM | POA: Insufficient documentation

## 2021-01-02 DIAGNOSIS — Z170 Estrogen receptor positive status [ER+]: Secondary | ICD-10-CM | POA: Diagnosis not present

## 2021-01-02 DIAGNOSIS — Z87891 Personal history of nicotine dependence: Secondary | ICD-10-CM | POA: Diagnosis not present

## 2021-01-02 DIAGNOSIS — J449 Chronic obstructive pulmonary disease, unspecified: Secondary | ICD-10-CM | POA: Diagnosis not present

## 2021-01-02 DIAGNOSIS — E119 Type 2 diabetes mellitus without complications: Secondary | ICD-10-CM | POA: Insufficient documentation

## 2021-01-02 DIAGNOSIS — I2510 Atherosclerotic heart disease of native coronary artery without angina pectoris: Secondary | ICD-10-CM | POA: Insufficient documentation

## 2021-01-02 DIAGNOSIS — Z79899 Other long term (current) drug therapy: Secondary | ICD-10-CM | POA: Insufficient documentation

## 2021-01-02 DIAGNOSIS — Z7982 Long term (current) use of aspirin: Secondary | ICD-10-CM | POA: Insufficient documentation

## 2021-01-02 DIAGNOSIS — I739 Peripheral vascular disease, unspecified: Secondary | ICD-10-CM | POA: Diagnosis not present

## 2021-01-02 DIAGNOSIS — Z90710 Acquired absence of both cervix and uterus: Secondary | ICD-10-CM | POA: Insufficient documentation

## 2021-01-02 DIAGNOSIS — C541 Malignant neoplasm of endometrium: Secondary | ICD-10-CM | POA: Diagnosis not present

## 2021-01-02 DIAGNOSIS — Z7984 Long term (current) use of oral hypoglycemic drugs: Secondary | ICD-10-CM | POA: Insufficient documentation

## 2021-01-02 DIAGNOSIS — Z6841 Body Mass Index (BMI) 40.0 and over, adult: Secondary | ICD-10-CM | POA: Diagnosis not present

## 2021-01-02 NOTE — Patient Instructions (Signed)
Please notify Dr Lovely Kerins at phone number [phone] if you notice vaginal bleeding, new pelvic or abdominal pains, bloating, feeling full easy, or a change in bladder or bowel function.   Please contact Dr Aziah Brostrom's office (at [phone]) in or after July to request an appointment with her for November, 2022.  

## 2021-01-03 ENCOUNTER — Other Ambulatory Visit: Payer: Self-pay | Admitting: Family Medicine

## 2021-01-03 NOTE — Telephone Encounter (Signed)
Last OV 10/02/20 Last fill 11/11/20  #$60/0

## 2021-01-06 ENCOUNTER — Ambulatory Visit: Payer: 59

## 2021-01-08 ENCOUNTER — Other Ambulatory Visit: Payer: Self-pay

## 2021-01-09 ENCOUNTER — Encounter: Payer: Self-pay | Admitting: Family Medicine

## 2021-01-09 ENCOUNTER — Ambulatory Visit (INDEPENDENT_AMBULATORY_CARE_PROVIDER_SITE_OTHER): Payer: 59 | Admitting: Family Medicine

## 2021-01-09 VITALS — BP 160/84 | HR 96 | Temp 97.4°F | Ht 70.0 in | Wt 319.2 lb

## 2021-01-09 DIAGNOSIS — J4540 Moderate persistent asthma, uncomplicated: Secondary | ICD-10-CM

## 2021-01-09 DIAGNOSIS — F411 Generalized anxiety disorder: Secondary | ICD-10-CM

## 2021-01-09 DIAGNOSIS — I739 Peripheral vascular disease, unspecified: Secondary | ICD-10-CM | POA: Diagnosis not present

## 2021-01-09 DIAGNOSIS — Z6841 Body Mass Index (BMI) 40.0 and over, adult: Secondary | ICD-10-CM

## 2021-01-09 DIAGNOSIS — I10 Essential (primary) hypertension: Secondary | ICD-10-CM | POA: Diagnosis not present

## 2021-01-09 MED ORDER — ENALAPRIL MALEATE 10 MG PO TABS: 20.0000 mg | ORAL_TABLET | Freq: Two times a day (BID) | ORAL | 2 refills | Status: DC

## 2021-01-09 NOTE — Progress Notes (Signed)
[facility] Finders [place] [address] Dept: [phone] Dept Fax: [phone]  Chronic Care Office Visit  Subjective:    Patient ID: Kelli Horn, female    DOB: [birth year], 65 y.o..   MRN: [id number]  Chief Complaint  Patient presents with  . Follow-up    3 month f/u Asthma, PAD.  No concerns.       History of Present Illness:  Patient is in today for reassessment of chronic medical issues.  [name] has been being seen by the Weight Management clinic and Dr. Owens Shark. She feels that this has not been helpful. She notes she was prescribed a 1,000 kcal/day diet, which she found to be unachievable. She was started on metformin and Wellbutrin to help control appetite. She notes the Wellbutrin was contributing to increased blood pressure. She also feels the metformin was contributing to weight gain rather than loss. She has since stopped both medications and does not feel she will return to the weight clinic. She feels very frustrated by the catch-22 she is in. She must lose weight to be a candidate for vascular surgery, but cannot tolerate exercise with her vascular issue. She wonders why they can't just remove 55 lbs of weight through surgery to then make her a candidate for the vascular procedure.  [name] notes her previous provider had been working with her to gain blood pressure control. She states she is not taking the amlodipine as ordered, but using half doses as she feels this causes her to have more leg edema. She self-adjusts her enalapril from 1.5-2 mg bid depending on her home blood pressures. She did use diuretics previously, but this contributed ot dry eyes and dry mouth issues.  [name] notes her breathing continues to be an issue. 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 She does admit to having smoked 3 ppd of cigarettes for many  years, having quit int he past. Because she was not having the breathing issues during the time she was a smoker, she did not attribute her current issues to the smoking.  [name] asks about a handicapped parking license plate. She is unable to walk 200 feet without rest due to breathlessness related to her breathing issues and her peripheral artery disease.  Past Medical History: Patient Active Problem List   Diagnosis Date Noted  . Generalized anxiety disorder 01/09/2021  . OSA (obstructive sleep apnea) 10/21/2020  . Diverticular disease of colon 10/02/2020  . Periodontal disease 10/02/2020  . Aortic occlusion ([place]) 08/20/2020  . Endometrial cancer ([place]) 07/11/2020  . Rectal bleeding 06/27/2020  . Carotid artery disease ([place]) 05/29/2020  . 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 PAD (peripheral artery disease) ([place]) 04/23/2020  . Upper back pain 03/24/2016  . Left shoulder pain 03/24/2016  . Hypersomnia with sleep apnea 02/13/2016  . 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 Morbid obesity with body mass index (BMI) of 45.0 to 49.9 in adult ([place]) 02/13/2016  . 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 Weakness of both legs 02/13/2016  . Angina syndrome, abdominal ([place]) 02/13/2016  . History of progressive weakness 01/10/2016  . Gait instability 01/10/2016  . Essential hypertension 12/11/2015  . Asthma 12/11/2015  . Normocytic anemia 12/11/2015  . Leukocytosis 12/11/2015  . Prediabetes 12/11/2015  . Unstable angina ([place]) 12/11/2015  . Chest pain with moderate risk for  cardiac etiology   . Dyslipidemia    Past Surgical History:  Procedure Laterality Date  . ABDOMINAL AORTOGRAM W/LOWER EXTREMITY Bilateral 08/08/2020   Procedure: ABDOMINAL AORTOGRAM W/LOWER EXTREMITY;  Surgeon: [name], MD;  Location: [facility];  Service: Cardiovascular;  Laterality: Bilateral;  . APPENDECTOMY    . BIOPSY  06/27/2020    Procedure: BIOPSY;  Surgeon: [name], MD;  [facility];  Service: Endoscopy;;  . BREAST LUMPECTOMY WITH RADIOACTIVE SEED AND SENTINEL LYMPH NODE BIOPSY Bilateral 06/07/2020   Procedure: BILATERAL BREAST LUMPECTOMY WITH RADIOACTIVE SEED AND LEFT SENTINEL LYMPH NODE BIOPSY;  Surgeon: [name], MD;  Location: [facility];  Service: General;  Laterality: Bilateral;  PEC BLOCK  . COLONOSCOPY WITH PROPOFOL N/A 06/27/2020   Procedure: COLONOSCOPY WITH PROPOFOL;  Surgeon: [name], MD;  [facility];  Service: Endoscopy;  Laterality: N/A;  . LEFT HEART CATH AND CORONARY ANGIOGRAPHY N/A 04/23/2020   Procedure: LEFT HEART CATH AND CORONARY ANGIOGRAPHY;  Surgeon: Martinique, Peter M, MD;  Location: [facility];  Service: Cardiovascular;  Laterality: N/A;  . POLYPECTOMY  06/27/2020   Procedure: POLYPECTOMY;  Surgeon: [name], MD;  [facility];  Service: Endoscopy;;  . ROBOTIC ASSISTED TOTAL HYSTERECTOMY WITH BILATERAL SALPINGO OOPHERECTOMY N/A 07/11/2020   Procedure: XI ROBOTIC ASSISTED TOTAL HYSTERECTOMY WITH BILATERAL SALPINGO OOPHORECTOMY;  Surgeon: [name], MD;  [facility];  Service: Gynecology;  Laterality: N/A;   Family History  Problem Relation Age of Onset  . Heart attack Father 66  . Aneurysm Father   . Diabetes Father   . Hypertension Father   . High Cholesterol Father   . Stroke Father   . Obesity Father   . AAA (abdominal aortic aneurysm) Father   . Breast cancer Maternal Aunt   . Lung cancer Mother   . Schizophrenia Mother   . Diabetes Other   . Heart disease Paternal Uncle   . Aneurysm Paternal Uncle    Outpatient Medications Prior to Visit  Medication Sig Dispense Refill  . albuterol (VENTOLIN HFA) 108 (90 Base) MCG/ACT inhaler TAKE 2 PUFFS BY MOUTH EVERY 6 HOURS AS NEEDED FOR WHEEZE OR SHORTNESS OF BREATH 18 each 0  . amLODipine (NORVASC) 5 MG tablet Take 5 mg by mouth daily.  180 tablet 3   . aspirin EC 81 MG tablet Take 81 mg by mouth daily. Swallow whole.    . hydrOXYzine (VISTARIL) 25 MG capsule Take 25 mg by mouth 4 (four) times daily as needed.    Vladimir Faster Glycol-Propyl Glycol 0.4-0.3 % SOLN Place 1 drop into both eyes 3 (three) times daily as needed (dry eyes).     . rosuvastatin (CRESTOR) 5 MG tablet Take 5 mg twice a week (Patient taking differently: Take 5 mg by mouth every Saturday.) 30 tablet 6  . TRELEGY ELLIPTA 100-62.5-25 MCG/INH AEPB TAKE 1 PUFF BY MOUTH EVERY DAY 60 each 6  . enalapril (VASOTEC) 10 MG tablet TAKE 1.5 TABLETS BY MOUTH 2 TIMES DAILY. 60 tablet 0  . FLOVENT HFA 110 MCG/ACT inhaler SMARTSIG:2 Puff(s) Via Inhaler Twice Daily PRN (Patient not taking: No sig reported)    . metFORMIN (GLUCOPHAGE) 500 MG tablet Take 1 tablet (500 mg total) by mouth daily with lunch. (Patient not taking: Reported on 01/09/2021) 90 tablet 0  . predniSONE (DELTASONE) 10 MG tablet Take 10 mg by mouth daily as needed. For emergency (Patient not taking: No sig reported)  No facility-administered medications prior to visit.   Allergies  Allergen Reactions  . Beta Adrenergic Blockers Other (See Comments)    Trigger Asthma  . Carvedilol Shortness Of Breath    Reacts with albuterol, worsens asthma symptoms when taken together   . Peanut-Containing Drug Products Other (See Comments)     Trigger Asthma attack  . Statins Other (See Comments)     Objective:   Today's Vitals   01/09/21 1359  BP: (!) 160/84  Pulse: 96  Temp: (!) 97.4 F (36.3 C)  TempSrc: Temporal  SpO2: 98%  Weight: (!) 319 lb 3.2 oz (144.8 kg)  Height: 5\' 10"  (1.778 m)   Body mass index is 45.8 kg/m.   General: Well developed, well nourished. No acute distress. Extremities: Trace edema noted. Psych: Alert and oriented. Normal mood and affect.  Health Maintenance Due  Topic Date Due  . HIV Screening  Never done  . Hepatitis C Screening  Never done  . PAP SMEAR-Modifier  Never done  .  COVID-19 Vaccine (3 - Pfizer risk 4-dose series) 07/29/2020     Depression screen [facility] 2/9 01/09/2021 11/05/2020 10/02/2020  Decreased Interest 0 3 0  Down, Depressed, Hopeless 0 3 0  PHQ - 2 Score 0 6 0  Altered sleeping 0 3 -  Tired, decreased energy 0 3 -  Change in appetite 0 3 -  Feeling bad or failure about yourself  0 1 -  Trouble concentrating 0 0 -  Moving slowly or fidgety/restless 0 3 -  Suicidal thoughts 0 2 -  PHQ-9 Score 0 21 -  Difficult doing work/chores Not difficult at all Extremely dIfficult -   GAD 7 : Generalized Anxiety Score 01/09/2021  Nervous, Anxious, on Edge 3  Control/stop worrying 3  Worry too much - different things 3  Trouble relaxing 3  Restless 0  Easily annoyed or irritable 3  Afraid - awful might happen 0  Total GAD 7 Score 15  Anxiety Difficulty Somewhat difficult    Assessment & Plan:   1. Essential hypertension [name]' blood pressure is elevated. We discussed potential approaches to managing this. I am not comfortable with her self-managing at home by titrating enalapril doses. She was agreeable to taking 20 mg enalpril (two 10 mg tabs) twice a day. We will re-evaluate her controla t her next visit.  - enalapril (VASOTEC) 10 MG tablet; Take 2 tablets (20 mg total) by mouth 2 (two) times daily.  Dispense: 120 tablet; Refill: 2  2. PAD (peripheral artery disease) ([place]) Patient has significant PAD needing surgery, but is a high-risk surgical candidate in light of her obesity. I completed a Handicapped Parking Placard form and provided to the patient.  3. Moderate persistent asthma without complication On Trelegy and albuterol. Following with pulmonary.  4. Morbid obesity with body mass index (BMI) of 45.0 to 49.9 in adult [facility]) We discussed weight management issues. I explained that simply surgically removing 50 lbs of fat would not resolve the surgical risk that she faces. I offered referral to a bariatric surgeon to discuss potential  options for a surgical approach to her weight loss. Ms. Rovito wants to discuss this with her former PCP (who she continues to see periodically).  5. Generalized anxiety disorder Ms. Casanas is showing signs of anxiety. I recommended she consider starting on an SSRI for this. She is averse to taking "antidepressants" for multiple reasons. I encouraged her to consider the potential advantages and let me know if she  decides to try this approach.  [name], MD

## 2021-01-18 ENCOUNTER — Other Ambulatory Visit: Payer: Self-pay | Admitting: Family Medicine

## 2021-01-29 ENCOUNTER — Other Ambulatory Visit: Payer: 59

## 2021-02-04 ENCOUNTER — Other Ambulatory Visit: Payer: Self-pay

## 2021-02-04 ENCOUNTER — Telehealth: Payer: Self-pay

## 2021-02-04 ENCOUNTER — Other Ambulatory Visit: Payer: 59

## 2021-02-04 DIAGNOSIS — E785 Hyperlipidemia, unspecified: Secondary | ICD-10-CM

## 2021-02-04 DIAGNOSIS — R7303 Prediabetes: Secondary | ICD-10-CM

## 2021-02-04 NOTE — Telephone Encounter (Signed)
Patient came into the lab to get labs done.  Advised that there were no orders in the system and it would be best to wait till you were back in the office to speak with you first. She was stating something about A1-C, lipids .Marland Kitchen...  also states that she is going to see her old PCP next week. (advised that most insurances won't cover 2 PCP's and her response was "that you knew that she was still going to see him (he's a friend) and that insurance doesn't cover him anyways." Please advise.  Thanks. Dm/cma

## 2021-02-06 NOTE — Addendum Note (Signed)
Addended by: [name] on: 02/06/2021 08:28 AM   Modules accepted: Orders

## 2021-02-06 NOTE — Telephone Encounter (Signed)
Patient notified VIA phone and lab appt scheduled. Dm/cma

## 2021-02-11 ENCOUNTER — Other Ambulatory Visit (INDEPENDENT_AMBULATORY_CARE_PROVIDER_SITE_OTHER): Payer: 59

## 2021-02-11 ENCOUNTER — Other Ambulatory Visit: Payer: Self-pay

## 2021-02-11 DIAGNOSIS — I10 Essential (primary) hypertension: Secondary | ICD-10-CM

## 2021-02-11 DIAGNOSIS — R7303 Prediabetes: Secondary | ICD-10-CM | POA: Diagnosis not present

## 2021-02-11 DIAGNOSIS — I739 Peripheral vascular disease, unspecified: Secondary | ICD-10-CM

## 2021-02-11 LAB — LIPID PANEL
Cholesterol: 194 mg/dL (ref 0–200)
HDL: 40.6 mg/dL (ref >39.00)
LDL Cholesterol: 125 mg/dL — ABNORMAL HIGH (ref 0–99)
NonHDL: 153.07
Total CHOL/HDL Ratio: 5
Triglycerides: 141 mg/dL (ref 0.0–149.0)
VLDL: 28.2 mg/dL (ref 0.0–40.0)

## 2021-02-11 LAB — HEMOGLOBIN A1C: Hgb A1c MFr Bld: 6.7 % — ABNORMAL HIGH (ref 4.6–6.5)

## 2021-02-11 NOTE — Progress Notes (Signed)
Per orders of Dr. Gena Fray, pt is here for labs. Pt tolerated draw well.

## 2021-03-08 ENCOUNTER — Other Ambulatory Visit (INDEPENDENT_AMBULATORY_CARE_PROVIDER_SITE_OTHER): Payer: Self-pay | Admitting: Bariatrics

## 2021-03-08 DIAGNOSIS — R7303 Prediabetes: Secondary | ICD-10-CM

## 2021-03-09 ENCOUNTER — Other Ambulatory Visit: Payer: Self-pay | Admitting: Family Medicine

## 2021-03-09 DIAGNOSIS — K056 Periodontal disease, unspecified: Secondary | ICD-10-CM

## 2021-03-10 NOTE — Telephone Encounter (Signed)
Patient advised and will wait till her upcoming appt to discuss this.   Did advised that we could mak an appt sooner but she declined. Dm/cma

## 2021-03-10 NOTE — Telephone Encounter (Signed)
Refill request for: Chlorhexidine solution LR 10/02/20   Please review and advise.  Thanks.  Dm/cma

## 2021-04-02 ENCOUNTER — Other Ambulatory Visit: Payer: Self-pay | Admitting: Family Medicine

## 2021-04-02 ENCOUNTER — Other Ambulatory Visit: Payer: Self-pay

## 2021-04-02 ENCOUNTER — Other Ambulatory Visit: Payer: Self-pay | Admitting: Cardiology

## 2021-04-02 DIAGNOSIS — I739 Peripheral vascular disease, unspecified: Secondary | ICD-10-CM

## 2021-04-02 DIAGNOSIS — I10 Essential (primary) hypertension: Secondary | ICD-10-CM

## 2021-04-15 ENCOUNTER — Other Ambulatory Visit: Payer: Self-pay

## 2021-04-15 ENCOUNTER — Encounter: Payer: Self-pay | Admitting: Family Medicine

## 2021-04-15 ENCOUNTER — Ambulatory Visit (INDEPENDENT_AMBULATORY_CARE_PROVIDER_SITE_OTHER): Payer: 59 | Admitting: Family Medicine

## 2021-04-15 VITALS — BP 146/68 | HR 82 | Temp 98.1°F | Wt 321.6 lb

## 2021-04-15 DIAGNOSIS — Z6841 Body Mass Index (BMI) 40.0 and over, adult: Secondary | ICD-10-CM | POA: Diagnosis not present

## 2021-04-15 DIAGNOSIS — R7303 Prediabetes: Secondary | ICD-10-CM

## 2021-04-15 DIAGNOSIS — I10 Essential (primary) hypertension: Secondary | ICD-10-CM

## 2021-04-15 MED ORDER — RYBELSUS 3 MG PO TABS
1.0000 | ORAL_TABLET | Freq: Every day | ORAL | 0 refills | Status: DC
Start: 2021-04-15 — End: 2021-05-20

## 2021-04-15 MED ORDER — METFORMIN HCL 500 MG PO TABS: 500.0000 mg | ORAL_TABLET | Freq: Every day | ORAL | 3 refills | Status: DC

## 2021-04-15 MED ORDER — RYBELSUS 7 MG PO TABS
1.0000 | ORAL_TABLET | Freq: Every day | ORAL | 2 refills | Status: DC
Start: 2021-04-15 — End: 2021-05-20

## 2021-04-15 NOTE — Progress Notes (Signed)
[facility] Find[facility] [place] [address] Dept: [phone] Dept Fax: [phone]  Chronic Care Office Visit  Subjective:    Patient ID: Kelli Horn, female    DOB: 1955/12/06, 64 y.o..   MRN: [id number]  Chief Complaint  Patient presents with   Follow-up    3 mo f/u. Pt c/o nausea, not able to hold food down with fatigue 3-4 weeks. Laying down relieves some of the nausea.Bs ranges 139-145     History of Present Illness:  Patient is in today for reassessment of chronic medical issues.  Kelli Horn notes that overall, she is not feeling as well as in the past. She states that she has some nausea that may be associated with her metformin, but may also be associated with hyperglycemia. She notes her blood sugar is often in the 160s when she has the nausea. She had stopped her metformin for a period of time, but her sugars were running higher. As her last HbA1c was 6.7, she was concerned about worsening diabetes. She had been attending the weight management clinic, but felt she was not losing weight.  Kelli Horn has a history of hypertension. She is currently managed on amlodipine and enalapril. She notes she was also on some HCTZ previously, and thinks she may need to be back o n this eventually.  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   Periodontal disease 10/02/2020   Aortic occlusion ([place]) 08/20/2020   Endometrial cancer ([place]) 07/11/2020   Rectal bleeding 06/27/2020   Carotid artery disease ([place]) 05/29/2020   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   PAD (peripheral artery disease) ([place]) 04/23/2020   Upper back pain 03/24/2016   Left shoulder pain 03/24/2016   Hypersomnia with sleep apnea 02/13/2016   Narcolepsy with cataplexy  02/13/2016   Morbid obesity with body mass index (BMI) of 45.0 to 49.9 in adult ([place]) 02/13/2016   Circadian rhythm sleep disorder, 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   Weakness of both legs 02/13/2016   Angina syndrome, abdominal ([place]) 02/13/2016   History of progressive weakness 01/10/2016   Gait instability 01/10/2016   Essential hypertension 12/11/2015   Asthma 12/11/2015   Normocytic anemia 12/11/2015   Leukocytosis 12/11/2015   Prediabetes 12/11/2015   Unstable angina ([place]) 12/11/2015   Chest pain with moderate risk for cardiac etiology    Dyslipidemia     Past Surgical History:  Procedure Laterality Date   ABDOMINAL AORTOGRAM W/LOWER EXTREMITY Bilateral 08/08/2020   Procedure: ABDOMINAL AORTOGRAM W/LOWER EXTREMITY;  Surgeon: [name], MD;  Location: [facility];  Service: Cardiovascular;  Laterality: Bilateral;   APPENDECTOMY     BIOPSY  06/27/2020   Procedure: BIOPSY;  Surgeon: [name], MD;  [facility];  Service: Endoscopy;;   BREAST LUMPECTOMY WITH RADIOACTIVE SEED AND SENTINEL LYMPH NODE BIOPSY Bilateral 06/07/2020   Procedure: BILATERAL BREAST LUMPECTOMY WITH RADIOACTIVE SEED AND LEFT SENTINEL LYMPH NODE BIOPSY;  Surgeon: [name], MD;  Location: [facility];  Service: General;  Laterality: Bilateral;  PEC BLOCK   COLONOSCOPY WITH PROPOFOL N/A 06/27/2020   Procedure: COLONOSCOPY WITH PROPOFOL;  Surgeon: [name], MD;  [facility];  Service: Endoscopy;  Laterality: N/A;   LEFT HEART CATH AND CORONARY ANGIOGRAPHY N/A 04/23/2020   Procedure: LEFT  HEART CATH AND CORONARY ANGIOGRAPHY;  Surgeon: Martinique, Peter M, MD;  Location: [facility];  Service: Cardiovascular;  Laterality: N/A;   POLYPECTOMY  06/27/2020   Procedure: POLYPECTOMY;  Surgeon: [name], MD;  [facility];  Service: Endoscopy;;   ROBOTIC ASSISTED TOTAL HYSTERECTOMY WITH BILATERAL SALPINGO OOPHERECTOMY N/A  07/11/2020   Procedure: XI ROBOTIC ASSISTED TOTAL HYSTERECTOMY WITH BILATERAL SALPINGO OOPHORECTOMY;  Surgeon: [name], MD;  [facility];  Service: Gynecology;  Laterality: N/A;   Family History  Problem Relation Age of Onset   Heart attack Father 35   Aneurysm Father    Diabetes Father    Hypertension Father    High Cholesterol Father    Stroke Father    Obesity Father    AAA (abdominal aortic aneurysm) Father    Breast cancer Maternal Aunt    Lung cancer Mother    Schizophrenia Mother    Diabetes Other    Heart disease Paternal Uncle    Aneurysm Paternal Uncle    Outpatient Medications Prior to Visit  Medication Sig Dispense Refill   albuterol (VENTOLIN HFA) 108 (90 Base) MCG/ACT inhaler TAKE 2 PUFFS BY MOUTH EVERY 6 HOURS AS NEEDED FOR WHEEZE OR SHORTNESS OF BREATH 18 each 2   amLODipine (NORVASC) 5 MG tablet TAKE 1 TABLET BY MOUTH EVERY DAY 90 tablet 3   aspirin EC 81 MG tablet Take 81 mg by mouth daily. Swallow whole.     enalapril (VASOTEC) 10 MG tablet TAKE 2 TABLETS BY MOUTH 2 TIMES DAILY. 360 tablet 0   hydrOXYzine (VISTARIL) 25 MG capsule Take 25 mg by mouth 4 (four) times daily as needed.     Polyethyl Glycol-Propyl Glycol 0.4-0.3 % SOLN Place 1 drop into both eyes 3 (three) times daily as needed (dry eyes).      rosuvastatin (CRESTOR) 5 MG tablet Take 5 mg twice a week (Patient taking differently: Take 5 mg by mouth every Saturday.) 30 tablet 6   TRELEGY ELLIPTA 100-62.5-25 MCG/INH AEPB TAKE 1 PUFF BY MOUTH EVERY DAY 60 each 6   metFORMIN (GLUMETZA) 500 MG (MOD) 24 hr tablet Take 500 mg by mouth daily with breakfast.     No facility-administered medications prior to visit.   Allergies  Allergen Reactions   Beta Adrenergic Blockers Other (See Comments)    Trigger Asthma   Carvedilol Shortness Of Breath    Reacts with albuterol, worsens asthma symptoms when taken together    Peanut-Containing Drug Products Other (See Comments)     Trigger Asthma attack    Statins Other (See Comments)    Objective:   Today's Vitals   04/15/21 1312  BP: (!) 146/68  Pulse: 82  Temp: 98.1 F (36.7 C)  TempSrc: Oral  SpO2: (!) 82%  Weight: (!) 321 lb 9.6 oz (145.9 kg)   Body mass index is 46.14 kg/m.   General: Well developed, well nourished. No acute distress. Psych: Alert and oriented. Mood and affect normal.  Health Maintenance Due  Topic Date Due   Pneumococcal Vaccine 36-42 Years old (1 - PCV) Never done   HIV Screening  Never done   Hepatitis C Screening  Never done   Zoster Vaccines- Shingrix (1 of 2) Never done   PAP SMEAR-Modifier  Never done   COVID-19 Vaccine (3 - Pfizer risk series) 07/29/2020   INFLUENZA VACCINE  03/31/2021   Lab Results Lab Results  Component Value Date   CHOL 194 02/11/2021   HDL 40.60 02/11/2021  LDLCALC 125 (H) 02/11/2021   TRIG 141.0 02/11/2021   CHOLHDL 5 02/11/2021   Lab Results  Component Value Date   HGBA1C 6.7 (H) 02/11/2021      Assessment & Plan:   1. Prediabetes 2. Morbid obesity with body mass index (BMI) of 45.0 to 49.9 in adult [facility]) Kelli Horn last HbA1c was in the diabetes range. She has restarted metformin, ut this is likely a source of nausea for her. I recommend we add semaglutide to help with blood sugar controla nd to reduce weight. I will see her again in 3 months.  - metFORMIN (GLUCOPHAGE) 500 MG tablet; Take 1 tablet (500 mg total) by mouth daily at 12 noon.  Dispense: 90 tablet; Refill: 3 - Semaglutide (RYBELSUS) 3 MG TABS; Take 1 tablet by mouth daily. Step up to 7 mg dose after 30 days.  Dispense: 30 tablet; Refill: 0 - Semaglutide (RYBELSUS) 7 MG TABS; Take 1 tablet by mouth daily. Start after completing 30 days of 3 mg dose.  Dispense: 30 tablet; Refill: 2  3. Essential hypertension Blood pressure is in marginal control. We will consider adding HCTZ at her next visit, if blood pressure remains elevated.  [name], MD

## 2021-04-22 ENCOUNTER — Ambulatory Visit (HOSPITAL_COMMUNITY)
Admission: RE | Admit: 2021-04-22 | Discharge: 2021-04-22 | Disposition: A | Discharge status: Home / Self Care | Payer: 59 | Source: Ambulatory Visit | Attending: Vascular Surgery | Admitting: Vascular Surgery

## 2021-04-22 ENCOUNTER — Encounter: Payer: Self-pay | Admitting: Vascular Surgery

## 2021-04-22 ENCOUNTER — Ambulatory Visit (INDEPENDENT_AMBULATORY_CARE_PROVIDER_SITE_OTHER): Payer: 59 | Admitting: Vascular Surgery

## 2021-04-22 ENCOUNTER — Other Ambulatory Visit: Payer: Self-pay

## 2021-04-22 VITALS — BP 176/82 | HR 85 | Temp 97.4°F | Resp 16 | Ht 70.0 in | Wt 320.0 lb

## 2021-04-22 DIAGNOSIS — I700 Atherosclerosis of aorta: Secondary | ICD-10-CM | POA: Diagnosis not present

## 2021-04-22 DIAGNOSIS — I739 Peripheral vascular disease, unspecified: Secondary | ICD-10-CM | POA: Diagnosis not present

## 2021-04-22 NOTE — Progress Notes (Signed)
Patient name: Kelli Horn MRN: [id number] DOB: May 25, 1956 Sex: female  REASON FOR CONSULT: 25-monthfollow-up for bilateral lower extremity claudication with aortoiliac occlusive disease  HPI: Kelli Horn a 65y.o. female, with history of hypertension, hyperlipidemia, COPD, morbid obesity, peripheral vascular disease that presents for 330-monthollow-up.  She was previously referred by Dr. BeGwenlyn Foundor evaluation of aortobifemoral bypass. Patient describes years of calf and buttock burning as well as burning in her calf when she walks consistent with claudication.  She has been under the care of Dr. 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  She was subsequently referred to vascular surgery. Sof note, her cardiologist is Dr. [name] previous had heart cath with no significant CAD.  Last echo from 05/13/20 showed EF 55-60% and LVEF normal function.  On follow-up today states she does not feel well overall.  She has had a number of changes to her medications recently including starting Wellbutrin and metformin after going to the weight loss clinic and trying to suppress her appetite.  In addition she has had some changes to her statin medication.  No rest pain or tissue loss. Still can only walk very short distances and then has to stop.  Past Medical History:  Diagnosis Date   Acid reflux    Anxiety    Asthma    Atherosclerosis    Back pain    [place] [place]   left breast DCIS   Chest pain    Chronic kidney disease    Right side question infarction   Constipation    COPD (chronic obstructive pulmonary disease) (HCC)    Diabetes mellitus without complication (HCC)    no meds   Edema of both lower extremities    GERD (gastroesophageal reflux disease)    Hyperlipidemia    Hypertension    Joint pain    Kidney problem    Obesity     PAD [place]ipheral artery disease) ([facility]   Peripheral vascular disease ([facility]   severe athrosclerosis   Sleep apnea     no cpap at this time. Sleep study done Jun 20 2020   SOB (shortness of breath)    Swallowing difficulty     Past Surgical History:  Procedure Laterality Date   ABDOMINAL AORTOGRAM W/LOWER EXTREMITY Bilateral 08/08/2020   Procedure: ABDOMINAL AORTOGRAM W/LOWER EXTREMITY;  Surgeon: BeLorretta HarpMD;  [facility];  Service: Cardiovascular;  Laterality: Bilateral;   APPENDECTOMY     BIOPSY  06/27/2020   Procedure: BIOPSY;  Surgeon: ScWilford CornerMD;  [facility];  Service: Endoscopy;;   BREAST LUMPECTOMY WITH RADIOACTIVE SEED AND SENTINEL LYMPH NODE BIOPSY Bilateral 06/07/2020   Procedure: BILATERAL BREAST LUMPECTOMY WITH RADIOACTIVE SEED AND LEFT SENTINEL LYMPH NODE BIOPSY;  Surgeon: ToJovita KussmaulMD;  [place] [facility] Service: General;  Laterality: Bilateral;  PEC BLOCK   COLONOSCOPY WITH PROPOFOL N/A 06/27/2020   Procedure: COLONOSCOPY WITH PROPOFOL;  Surgeon: ScWilford CornerMD;  Location: [facility];  Service: Endoscopy;  Laterality: N/A;   LEFT HEART CATH AND CORONARY ANGIOGRAPHY N/A 04/23/2020   Procedure: LEFT HEART CATH AND CORONARY ANGIOGRAPHY;  Surgeon: JoMartiniquePeter M, MD;  [facility];  Service: Cardiovascular;  Laterality: N/A;   POLYPECTOMY  06/27/2020   Procedure: POLYPECTOMY;  Surgeon: ScWilford CornerMD;  [facility];  Service: Endoscopy;;   ROBOTIC ASSISTED TOTAL HYSTERECTOMY WITH BILATERAL SALPINGO OOPHERECTOMY N/A 07/11/2020   Procedure: XI ROBOTIC ASSISTED TOTAL HYSTERECTOMY WITH BILATERAL SALPINGO OOPHORECTOMY;  Surgeon: [name], MD;  Location: [facility];  Service: Gynecology;  Laterality: N/A;    Family History  Problem Relation Age of Onset   Heart attack Father 34   Aneurysm Father    Diabetes Father    Hypertension Father    High Cholesterol Father     Stroke Father    Obesity Father    AAA (abdominal aortic aneurysm) Father    Breast cancer Maternal Aunt    Lung cancer Mother    Schizophrenia Mother    Diabetes Other    Heart disease Paternal Uncle    Aneurysm Paternal Uncle     SOCIAL HISTORY: Social History   Socioeconomic History   Marital status: Married    Spouse name: Not on file   Number of children: 2   Years of education: Not on file   Highest education level: Not on file  Occupational History   Occupation: retired , 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  Tobacco Use   Smoking status: Former    Packs/day: 2.00    Years: 10.00    Pack years: 20.00    Types: Cigarettes    Quit date: 04/16/2004    Years since quitting: 17.0   Smokeless tobacco: Never  Vaping Use   Vaping Use: Never used  Substance and Sexual Activity   Alcohol use: No   Drug use: No   Sexual activity: Not Currently  Other Topics Concern   Not on file  Social History Narrative   Not on file   Social Determinants of Health   Financial Resource Strain: Not on file  Food Insecurity: Not on file  Transportation Needs: Not on file  Physical Activity: Not on file  Stress: Not on file  Social Connections: Not on file  Intimate Partner Violence: Not on file    Allergies  Allergen Reactions   Beta Adrenergic Blockers Other (See Comments)    Trigger Asthma   Carvedilol Shortness Of Breath    Reacts with albuterol, worsens asthma symptoms when taken together    Peanut-Containing Drug Products Other (See Comments)     Trigger Asthma attack   Statins Other (See Comments)    Current Outpatient Medications  Medication Sig Dispense Refill   albuterol (VENTOLIN HFA) 108 (90 Base) MCG/ACT inhaler TAKE 2 PUFFS BY MOUTH EVERY 6 HOURS AS NEEDED FOR WHEEZE OR SHORTNESS OF BREATH 18 each 2   amLODipine (NORVASC) 5 MG tablet TAKE 1 TABLET BY MOUTH EVERY DAY 90 tablet 3   aspirin EC 81 MG tablet Take 81 mg by mouth daily. Swallow whole.     enalapril  (VASOTEC) 10 MG tablet TAKE 2 TABLETS BY MOUTH 2 TIMES DAILY. 360 tablet 0   hydrOXYzine (VISTARIL) 25 MG capsule Take 25 mg by mouth 4 (four) times daily as needed.     metFORMIN (GLUCOPHAGE) 500 MG tablet Take 1 tablet (500 mg total) by mouth daily at 12 noon. 90 tablet 3   Polyethyl Glycol-Propyl Glycol 0.4-0.3 % SOLN Place 1 drop into both eyes 3 (three) times daily as needed (dry eyes).      rosuvastatin (CRESTOR) 5 MG tablet Take 5 mg twice a week (Patient taking differently: Take 5 mg by mouth every Saturday.) 30 tablet 6   Semaglutide (RYBELSUS) 3 MG TABS Take 1 tablet by  mouth daily. Step up to 7 mg dose after 30 days. 30 tablet 0   Semaglutide (RYBELSUS) 7 MG TABS Take 1 tablet by mouth daily. Start after completing 30 days of 3 mg dose. 30 tablet 2   TRELEGY ELLIPTA 100-62.5-25 MCG/INH AEPB TAKE 1 PUFF BY MOUTH EVERY DAY 60 each 6   No current facility-administered medications for this visit.    REVIEW OF SYSTEMS:  '[X]'$  denotes positive finding, '[ ]'$  denotes negative finding Cardiac  Comments:  Chest pain or chest pressure:    Shortness of breath upon exertion:    Short of breath when lying flat:    Irregular heart rhythm:        Vascular    Pain in calf, thigh, or hip brought on by ambulation: x   Pain in feet at night that wakes you up from your sleep:     Blood clot in your veins:    Leg swelling:         Pulmonary    Oxygen at home:    Productive cough:     Wheezing:         Neurologic    Sudden weakness in arms or legs:     Sudden numbness in arms or legs:     Sudden onset of difficulty speaking or slurred speech:    Temporary loss of vision in one eye:     Problems with dizziness:         Gastrointestinal    Blood in stool:     Vomited blood:         Genitourinary    Burning when urinating:     Blood in urine:        Psychiatric    Major depression:         Hematologic    Bleeding problems:    Problems with blood clotting too easily:        Skin     Rashes or ulcers:        Constitutional    Fever or chills:      PHYSICAL EXAM: Vitals:   04/22/21 1137  BP: (!) 176/82  Pulse: 85  Resp: 16  Temp: (!) 97.4 F (36.3 C)  TempSrc: Temporal  SpO2: 94%  Weight: (!) 320 lb (145.2 kg)  Height: '5\' 10"'$  (1.778 m)    GENERAL: The patient is a well-nourished female, in no acute distress. The vital signs are documented above. CARDIAC: There is a regular rate and rhythm.  VASCULAR:  No palpable femoral pulses, large pannus No palpable pedal pulses No active tissue loss on lower extremities PULMONARY: No respiratory distress. ABDOMEN: Soft and non-tender.  Obese. MUSCULOSKELETAL: There are no major deformities or cyanosis. NEUROLOGIC: No focal weakness or paresthesias are detected. SKIN: There are no ulcers or rashes noted. PSYCHIATRIC: The patient has a normal affect.  DATA:   Previous CTA on my review shows occluded distal infrarenal aorta with occluded common iliacs bilaterally.  ABIs today are 0.69 on the right biphasic and 0.65 on the left biphasic and relatively stable.  Assessment/Plan:  65 year old female previously referred by Dr. [name] for evaluation of aortobifemoral bypass. She presents for 3 month follow-up today.  I previously reviewed her CTA scan and I do not think she has any endovascular options. I do think she would benefit from aortobifemoral bypass as her best option for revascularization.  We again discussed this being a complicated situation with her morbid obesity.  Unfortunately she has gained 10  pounds since I last saw her and weight now 320 pounds.  Her BMI is now 46.  She continues to have lifestyle limiting claudication with no evidence of critical limb ischemia like rest pain or tissue loss.  Overall she is really not doing well with a number of stressors in her life.  We discussed continuing conservative management and I encouraged weight loss and walking to her ability.  Discussed in the setting of  claudication this is elective surgery and it only becomes urgent when she develops signs of critical limb ischemia which she does not have at this time.  I will see her in 6 months with ABI's.  Marty Heck, MD [facility] Office: [phone]

## 2021-04-23 ENCOUNTER — Other Ambulatory Visit: Payer: Self-pay

## 2021-04-23 DIAGNOSIS — I739 Peripheral vascular disease, unspecified: Secondary | ICD-10-CM

## 2021-04-30 ENCOUNTER — Other Ambulatory Visit: Payer: Self-pay | Admitting: Family Medicine

## 2021-05-08 ENCOUNTER — Other Ambulatory Visit (HOSPITAL_COMMUNITY): Payer: Self-pay | Admitting: Cardiology

## 2021-05-08 ENCOUNTER — Ambulatory Visit (HOSPITAL_COMMUNITY)
Admission: RE | Admit: 2021-05-08 | Discharge: 2021-05-08 | Disposition: A | Discharge status: Home / Self Care | Payer: 59 | Source: Ambulatory Visit | Attending: Internal Medicine | Admitting: Internal Medicine

## 2021-05-08 ENCOUNTER — Other Ambulatory Visit: Payer: Self-pay

## 2021-05-08 DIAGNOSIS — I6523 Occlusion and stenosis of bilateral carotid arteries: Secondary | ICD-10-CM

## 2021-05-08 DIAGNOSIS — I6522 Occlusion and stenosis of left carotid artery: Secondary | ICD-10-CM | POA: Diagnosis present

## 2021-05-13 ENCOUNTER — Ambulatory Visit
Admission: RE | Admit: 2021-05-13 | Discharge: 2021-05-13 | Disposition: A | Discharge status: Home / Self Care | Payer: 59 | Source: Ambulatory Visit | Attending: Hematology | Admitting: Hematology

## 2021-05-13 ENCOUNTER — Other Ambulatory Visit: Payer: Self-pay

## 2021-05-13 DIAGNOSIS — D0512 Intraductal carcinoma in situ of left breast: Secondary | ICD-10-CM

## 2021-05-20 ENCOUNTER — Encounter: Payer: Self-pay | Admitting: Pulmonary Disease

## 2021-05-20 ENCOUNTER — Other Ambulatory Visit: Payer: Self-pay

## 2021-05-20 ENCOUNTER — Ambulatory Visit (INDEPENDENT_AMBULATORY_CARE_PROVIDER_SITE_OTHER): Payer: 59 | Admitting: Pulmonary Disease

## 2021-05-20 DIAGNOSIS — G4733 Obstructive sleep apnea (adult) (pediatric): Secondary | ICD-10-CM

## 2021-05-20 DIAGNOSIS — J4540 Moderate persistent asthma, uncomplicated: Secondary | ICD-10-CM

## 2021-05-20 DIAGNOSIS — I10 Essential (primary) hypertension: Secondary | ICD-10-CM

## 2021-05-20 MED ORDER — ALBUTEROL SULFATE HFA 108 (90 BASE) MCG/ACT IN AERS: 2.0000 | INHALATION_SPRAY | Freq: Four times a day (QID) | RESPIRATORY_TRACT | 2 refills | Status: DC | PRN

## 2021-05-20 MED ORDER — FLUTICASONE FUROATE-VILANTEROL 100-25 MCG/INH IN AEPB
1.0000 | INHALATION_SPRAY | Freq: Every day | RESPIRATORY_TRACT | 0 refills | Status: DC
Start: 2021-05-20 — End: 2021-07-22

## 2021-05-20 MED ORDER — FLUTICASONE FUROATE-VILANTEROL 100-25 MCG/INH IN AEPB: 1.0000 | INHALATION_SPRAY | Freq: Every day | RESPIRATORY_TRACT | 5 refills | Status: DC

## 2021-05-20 NOTE — Progress Notes (Signed)
Subjective:    Patient ID: Kelli Horn, female    DOB: Jan 06, 1956, 64 y.o.   MRN: [id number]  HPI 65 yo obese ex-smoker for follow-up of asthma/COPD, OSA & delayed sleep phase syndrome  PFTs have shown severe obstructive defect with mild diffusion defect.  She was started on Trelegy.  Smoked About 20 pack years before she quit in 2015  PMH -endometrial cancer, left sided breast cancer, stage I, ER/PR positive, peripheral vascular disease, coronary artery disease, possible enteric ischemia,   diabetes mellitus  INitial OV 07/2020 told by the neurologist that she "could have narcolepsy".  She never underwent MSLT for detailed sleep testing in the past >> deferred MSLT due to delayed sleep phase  10/2020 OV - has not tried wearing CPAP since she received machine in December 2021. It is very unlikely she will feel comfortable enough to wear mack d/t claustrophobia >> CPAP was discontinued  She presents today to establish pulmonary care with me.  She has seen my partner Dr. Jarvis Newcomer and was started on Trelegy for asthma start COPD.  Unfortunately this caused her weight gain of about 20 pounds, hypertension requiring an extra medication and hyperglycemia with HbA1c increasing from 5.8-6.7.  She is very upset about this and would like to discuss alternatives.  She is continued on Trelegy because she is afraid that she has become dependent on it and stopping this would make her breathing worse  Regarding OSA she does not want to pursue further therapy at this time and wants to focus on weight loss She needs to have pulm vascular bypass but weight loss has been suggested prior to this  She reports asthma as a child but grew out of this as a young adult and then symptoms resurfaced about 10 years ago.  Had good results with Breo, has used Advair but does not remember if this helped.  No clear triggers, denies seasonal allergies. Med review also shows enalapril  Significant tests/ events  reviewed  05/2020 Eos 300  03/2020 PFT: FVC 61%, FEV1 44%, ratio 56, 8% change in FEV1 post. No TLC. DLC 70%/17.23   05/2020 HST -AHI 7.7/hour, nadir saturation 84%, 11 minutes with saturation less than 88%  Past Medical History:  Diagnosis Date   Acid reflux    Anxiety    Asthma    Atherosclerosis    Back pain    Breast cancer ([place]) 05/2020   left breast DCIS   Chest pain    Chronic kidney disease    Right side question infarction   Constipation    COPD (chronic obstructive pulmonary disease) (HCC)    Diabetes mellitus without complication (HCC)    no meds   Edema of both lower extremities    GERD (gastroesophageal reflux disease)    Hyperlipidemia    Hypertension    Joint pain    Kidney problem    Obesity    PAD (peripheral artery disease) ([place])    Peripheral vascular disease ([place])    severe athrosclerosis   Sleep apnea     no cpap at this time. Sleep study done Jun 20 2020   SOB (shortness of breath)    Swallowing difficulty      Review of Systems neg for any significant sore throat, dysphagia, itching, sneezing, nasal congestion or excess/ purulent secretions, fever, chills, sweats, unintended wt loss, pleuritic or exertional cp, hempoptysis, orthopnea pnd or change in chronic leg swelling. Also denies presyncope, palpitations, heartburn, abdominal pain, nausea, vomiting, diarrhea or  change in bowel or urinary habits, dysuria,hematuria, rash, arthralgias, visual complaints, headache, numbness weakness or ataxia.     Objective:   Physical Exam  Gen. Pleasant, obese, in no distress ENT - no lesions, no post nasal drip Neck: No JVD, no thyromegaly, no carotid bruits Lungs: no use of accessory muscles, no dullness to percussion, decreased without rales or rhonchi  Cardiovascular: Rhythm regular, heart sounds  normal, no murmurs or gallops, no peripheral edema Musculoskeletal: No deformities, no cyanosis or clubbing , no tremors       Assessment & Plan:

## 2021-05-20 NOTE — Patient Instructions (Signed)
Sample & Rx  of Breo 100  once daily x 1 month x 3 refills Use albuterol /pro-air as needed

## 2021-05-21 NOTE — Assessment & Plan Note (Signed)
Her clinical history seems to suggest component of asthma although no reversibility was noted on PFTs.  She does seem to require an inhaled steroid. We had a long discussion about side effects of steroids in general and inhaled steroids in particular.  She does seem to have had side effects including weight gain and hypertension. We discussed alternatives to Trelegy including [employer], she has had good results with Breo in the past and was willing to try this. We discussed signs and symptoms of an asthma flare and a treatment plan for this.

## 2021-05-21 NOTE — Assessment & Plan Note (Signed)
Degree of OSA appears to be mild and agree that we can focus on weight loss at this time rather than pursue CPAP therapy.  Doubt that oral appliance would be helpful, she would not be a candidate for inspire device due to high BMI

## 2021-05-21 NOTE — Assessment & Plan Note (Signed)
Enalapril has worked well with her but if symptoms of dyspnea are persistent and may have to consider switching to ARB

## 2021-06-12 ENCOUNTER — Telehealth: Payer: Self-pay | Admitting: Hematology

## 2021-06-12 NOTE — Telephone Encounter (Signed)
Rescheduled per provider sch change, message was left w pt

## 2021-06-21 ENCOUNTER — Other Ambulatory Visit: Payer: Self-pay | Admitting: Family Medicine

## 2021-06-21 DIAGNOSIS — I10 Essential (primary) hypertension: Secondary | ICD-10-CM

## 2021-07-15 ENCOUNTER — Other Ambulatory Visit: Payer: Self-pay

## 2021-07-15 ENCOUNTER — Ambulatory Visit
Admission: RE | Admit: 2021-07-15 | Discharge: 2021-07-15 | Disposition: A | Discharge status: Home / Self Care | Payer: 59 | Source: Ambulatory Visit | Attending: Hematology | Admitting: Hematology

## 2021-07-15 DIAGNOSIS — E2839 Other primary ovarian failure: Secondary | ICD-10-CM

## 2021-07-21 ENCOUNTER — Other Ambulatory Visit: Payer: Self-pay

## 2021-07-22 ENCOUNTER — Ambulatory Visit (INDEPENDENT_AMBULATORY_CARE_PROVIDER_SITE_OTHER): Payer: 59 | Admitting: Family Medicine

## 2021-07-22 VITALS — BP 146/80 | HR 85 | Temp 97.9°F | Ht 70.0 in | Wt 328.2 lb

## 2021-07-22 DIAGNOSIS — I10 Essential (primary) hypertension: Secondary | ICD-10-CM | POA: Diagnosis not present

## 2021-07-22 DIAGNOSIS — D72829 Elevated white blood cell count, unspecified: Secondary | ICD-10-CM | POA: Diagnosis not present

## 2021-07-22 DIAGNOSIS — R590 Localized enlarged lymph nodes: Secondary | ICD-10-CM

## 2021-07-22 DIAGNOSIS — J4540 Moderate persistent asthma, uncomplicated: Secondary | ICD-10-CM | POA: Diagnosis not present

## 2021-07-22 DIAGNOSIS — I6522 Occlusion and stenosis of left carotid artery: Secondary | ICD-10-CM

## 2021-07-22 DIAGNOSIS — J309 Allergic rhinitis, unspecified: Secondary | ICD-10-CM

## 2021-07-22 MED ORDER — FLUTICASONE PROPIONATE 50 MCG/ACT NA SUSP: 1.0000 | Freq: Every day | NASAL | 6 refills | Status: DC

## 2021-07-22 NOTE — Progress Notes (Signed)
[facility] [address] [place] [address] Dept: [phone] Dept Fax: [phone]  Chronic Care Office Visit  Subjective:    Patient ID: Kelli Horn, female    DOB: 03-01-1956, 65 y.o..   MRN: [id number]  Chief Complaint  Patient presents with   Follow-up    3 month f/u HTN/chol. Average BP 155/85.   Declines flu shot today.  No new concerns.     History of Present Illness:  Patient is in today for reassessment of chronic medical issues.  Ms. Gallego has a history of hypertension, managed on amlodipine. She had also been on HCTZ. She states she quit this because this was not compatible with her HCTZ.  Ms. Tye has a history of carotid artery disease. She went for an ultrasound in Sept. She notes that there was an incidental finding of enlarged lymph nodes, apparently in the anterior cervical chain. Her cardiologist had referred this issue to me. However, I was out of town at the time. Ms. Gowell saw her previous GP, Dr. Arelia Sneddon. He performed a CBC which showed a mildly high WBC which had been chronic for her. Ms. Lohr has noted some ear symptoms, esp. on the left. She admits to some allergy symptoms in the nose, but denies any swollen tonsils, sore throat, fever, or skin issues.  Ms. Lawn continues to follow with pulmonology related to her asthma. She was tried on [place], but noted she felt like she couldn't breath while using this. She is back on Trelegy at this point.   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   Periodontal disease 10/02/2020   Aortic occlusion ([place]) 08/20/2020   Endometrial cancer ([place]) 07/11/2020   Rectal bleeding 06/27/2020   Carotid artery disease ([place]) 05/29/2020   Ductal carcinoma in situ (DCIS) of left breast  05/17/2020   PAD (peripheral artery disease) ([place]) 04/23/2020   Upper back pain 03/24/2016   Left shoulder pain 03/24/2016   Hypersomnia with sleep apnea 02/13/2016   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   Morbid obesity with body mass index (BMI) of 45.0 to 49.9 in adult ([place]) 02/13/2016   Circadian rhythm sleep disorder, 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   Weakness of both legs 02/13/2016   Angina syndrome, abdominal ([place]) 02/13/2016   History of progressive weakness 01/10/2016   Gait instability 01/10/2016   Essential hypertension 12/11/2015   Asthma 12/11/2015   Normocytic anemia 12/11/2015   Leukocytosis 12/11/2015   Prediabetes 12/11/2015   Unstable angina ([place]) 12/11/2015   Chest pain with moderate risk for cardiac etiology    Dyslipidemia    Past Surgical History:  Procedure Laterality Date   ABDOMINAL AORTOGRAM W/LOWER EXTREMITY Bilateral 08/08/2020   Procedure: ABDOMINAL AORTOGRAM W/LOWER EXTREMITY;  Surgeon: [name], MD;  Location: [facility];  Service: Cardiovascular;  Laterality: Bilateral;   APPENDECTOMY     BIOPSY  06/27/2020   Procedure: BIOPSY;  Surgeon: [name], MD;  [facility];  Service: Endoscopy;;   BREAST LUMPECTOMY WITH RADIOACTIVE SEED AND SENTINEL LYMPH NODE BIOPSY Bilateral 06/07/2020   Procedure: BILATERAL BREAST LUMPECTOMY WITH RADIOACTIVE SEED AND LEFT SENTINEL LYMPH NODE BIOPSY;  Surgeon: [name], MD;  Location: [facility];  Service: General;  Laterality: Bilateral;  PEC BLOCK   COLONOSCOPY WITH PROPOFOL N/A 06/27/2020  Procedure: COLONOSCOPY WITH PROPOFOL;  Surgeon: [name], MD;  [facility];  Service: Endoscopy;  Laterality: N/A;   LEFT HEART CATH AND CORONARY ANGIOGRAPHY N/A 04/23/2020   Procedure: LEFT HEART CATH AND CORONARY ANGIOGRAPHY;  Surgeon: Martinique, Peter M, MD;  Location: [facility];  Service: Cardiovascular;  Laterality: N/A;   POLYPECTOMY  06/27/2020    Procedure: POLYPECTOMY;  Surgeon: [name], MD;  [facility];  Service: Endoscopy;;   ROBOTIC ASSISTED TOTAL HYSTERECTOMY WITH BILATERAL SALPINGO OOPHERECTOMY N/A 07/11/2020   Procedure: XI ROBOTIC ASSISTED TOTAL HYSTERECTOMY WITH BILATERAL SALPINGO OOPHORECTOMY;  Surgeon: [name], MD;  [facility];  Service: Gynecology;  Laterality: N/A;   Family History  Problem Relation Age of Onset   Heart attack Father 59   Aneurysm Father    Diabetes Father    Hypertension Father    High Cholesterol Father    Stroke Father    Obesity Father    AAA (abdominal aortic aneurysm) Father    Breast cancer Maternal Aunt    Lung cancer Mother    Schizophrenia Mother    Diabetes Other    Heart disease Paternal Uncle    Aneurysm Paternal Uncle    Outpatient Medications Prior to Visit  Medication Sig Dispense Refill   albuterol (VENTOLIN HFA) 108 (90 Base) MCG/ACT inhaler TAKE 2 PUFFS BY MOUTH EVERY 6 HOURS AS NEEDED FOR WHEEZE OR SHORTNESS OF BREATH 18 each 2   amLODipine (NORVASC) 5 MG tablet TAKE 1 TABLET BY MOUTH EVERY DAY 90 tablet 3   aspirin EC 81 MG tablet Take 81 mg by mouth daily. Swallow whole.     enalapril (VASOTEC) 10 MG tablet TAKE 2 TABLETS BY MOUTH TWICE A DAY 360 tablet 3   hydrOXYzine (VISTARIL) 25 MG capsule Take 25 mg by mouth 4 (four) times daily as needed.     Polyethyl Glycol-Propyl Glycol 0.4-0.3 % SOLN Place 1 drop into both eyes 3 (three) times daily as needed (dry eyes).      rosuvastatin (CRESTOR) 5 MG tablet Take 5 mg twice a week (Patient taking differently: Take 5 mg by mouth every Saturday.) 30 tablet 6   TRELEGY ELLIPTA 100-62.5-25 MCG/INH AEPB TAKE 1 PUFF BY MOUTH EVERY DAY 60 each 6   albuterol (PROAIR HFA) 108 (90 Base) MCG/ACT inhaler 2 puffs     hydrochlorothiazide (MICROZIDE) 12.5 MG capsule Take 12.5 mg by mouth daily. (Patient not taking: Reported on 07/22/2021)     albuterol (VENTOLIN HFA) 108 (90 Base) MCG/ACT inhaler Inhale 2  puffs into the lungs every 6 (six) hours as needed for wheezing or shortness of breath. 8 g 2   fluticasone furoate-vilanterol (BREO ELLIPTA) 100-25 MCG/INH AEPB Inhale 1 puff into the lungs daily. (Patient not taking: Reported on 07/22/2021) 1 each 5   fluticasone furoate-vilanterol (BREO ELLIPTA) 100-25 MCG/INH AEPB Inhale 1 puff into the lungs daily. 2 each 0   No facility-administered medications prior to visit.   Allergies  Allergen Reactions   Beta Adrenergic Blockers Other (See Comments)    Trigger Asthma   Carvedilol Shortness Of Breath    Reacts with albuterol, worsens asthma symptoms when taken together    Peanut-Containing Drug Products Other (See Comments)     Trigger Asthma attack   Statins Other (See Comments)    Objective:   Today's Vitals   07/22/21 1318  BP: (!) 146/80  Pulse: 85  Temp: 97.9 F (36.6 C)  TempSrc: Temporal  SpO2: 96%  Weight: Marland Kitchen)  328 lb 3.2 oz (148.9 kg)  Height: 5\' 10"  (1.778 m)   Body mass index is 47.09 kg/m.   General: Well developed, well nourished. No acute distress. HEENT: Normocephalic, non-traumatic. PERRL, EOMI. Conjunctiva clear. External ears   normal. Left EAC and TM normal. The  right TM has a normal light reflex, but appears to be   bulging. The nose is moderately congested and red with scant whitish rhinorrhea. Mucous   membranes moist. Oropharynx clear.  Neck: Supple. No lymphadenopathy. No thyromegaly. Psych: Alert and oriented. Normal mood and affect.  Health Maintenance Due  Topic Date Due   Pneumococcal Vaccine 67-57 Years old (1 - PCV) Never done   HIV Screening  Never done   Hepatitis C Screening  Never done   Zoster Vaccines- Shingrix (1 of 2) Never done   PAP SMEAR-Modifier  Never done   COVID-19 Vaccine (3 - Pfizer risk series) 07/29/2020   INFLUENZA VACCINE  Never done   Lab Results CBC 05/13/2021 WBC- 12.1 RBC- 5.48 Hgb- 13.9 Hct- 43.5 MCV- 79 MCH- 25.4    Imaging: Carotid ultrasound  (05/08/2021) Summary:  Right Carotid: Velocities in the right ICA are consistent with a 1-39% stenosis.                 Non-hemodynamically significant plaque <50% noted in the CCA. The                 ECA appears >50% stenosed. Essentially stable RICA velocities.   Left Carotid: Velocities in the left ICA are consistent with a 40-59% stenosis.                Hemodynamically significant plaque >50% visualized in the CCA. The                ECA appears >50% stenosed. Essentially stable LICA velocities.   Vertebrals:  Bilateral vertebral arteries demonstrate antegrade flow.  Subclavians: Bilateral subclavian arteries were stenotic. Bilateral subclavian               artery flow was disturbed.   Multiple enlarged lymph nodes in the neck, bilaterally. See above.   Assessment & Plan:   1. Essential hypertension Blood pressure is mildly high today, likely due to stop her thiazide diuretic. In review, prescribing information for Trelegy recommends monitoring potassium in patient using both Trelegy and thiazide diuretics, as the Trelegy can enhance the potassium lowering effect. I did not have the opportunity to address this issue before the patient left. We will reassess her blood pressure at her next visit and discuss this issue.  2. Stenosis of left carotid artery Stable. She will continue to follow with cardiology.  3. Moderate persistent asthma without complication Ms. Mcglown feels like her breathing is not doing well. She has an upcoming appointment with pulmonology.  4. Leukocytosis, unspecified type This has been chronic. The recent elevated WBC does not appear to be significant and may relate to chronic disease and obesity.  5. Anterior cervical lymphadenopathy These may be reactive lymph nodes related to her allergies and asthma.  6. Allergic rhinitis, unspecified seasonality, unspecified trigger Recommend we start her on a nasal steroid spray. I reviewed how to use the sprayer to  reduce nasal irritation. I also recommended she start nasal saline rinses to help reduce allergens, relieve mucous, and improve congestion.  - fluticasone (FLONASE) 50 MCG/ACT nasal spray; Place 1 spray into both nostrils daily.  Dispense: 16 g; Refill: 6  [name], MD

## 2021-07-23 ENCOUNTER — Other Ambulatory Visit: Payer: Self-pay | Admitting: Cardiology

## 2021-08-12 ENCOUNTER — Ambulatory Visit (INDEPENDENT_AMBULATORY_CARE_PROVIDER_SITE_OTHER): Payer: 59 | Admitting: Pulmonary Disease

## 2021-08-12 ENCOUNTER — Other Ambulatory Visit: Payer: Self-pay

## 2021-08-12 ENCOUNTER — Encounter: Payer: Self-pay | Admitting: Pulmonary Disease

## 2021-08-12 VITALS — BP 126/82 | HR 96 | Temp 97.7°F | Ht 71.0 in | Wt 328.2 lb

## 2021-08-12 DIAGNOSIS — J4540 Moderate persistent asthma, uncomplicated: Secondary | ICD-10-CM | POA: Diagnosis not present

## 2021-08-12 DIAGNOSIS — G4721 Circadian rhythm sleep disorder, delayed sleep phase type: Secondary | ICD-10-CM

## 2021-08-12 DIAGNOSIS — J441 Chronic obstructive pulmonary disease with (acute) exacerbation: Secondary | ICD-10-CM

## 2021-08-12 NOTE — Patient Instructions (Addendum)
X refills on trelegy  X discuss about enlarged lymph nodes with dr Burr Medico

## 2021-08-12 NOTE — Assessment & Plan Note (Signed)
Refills will be provided on Trelegy.  This is worked well for her. She can use albuterol MDI on a as needed basis and use albuterol nebs only for an emergency

## 2021-08-12 NOTE — Progress Notes (Signed)
° °  Subjective:    Patient ID: Kelli Horn, female    DOB: [birth year], 65 y.o.   MRN: [id number]  HPI  65 yo obese ex-smoker for follow-up of asthma/COPD, OSA & delayed sleep phase syndrome  PFTs - severe obstructive defect with mild diffusion defect.   Smoked About 20 pack years before she quit in 2015 -She did not tolerate CPAP mask due to claustrophobia and this was discontinued after 48-month trial -She reports asthma as a child but grew out of this as a young adult and then symptoms resurfaced about 10 years ago.      PMH -endometrial cancer, left sided breast cancer, stage I, ER/PR positive, severe peripheral vascular disease, coronary artery disease, possible enteric ischemia,   diabetes mellitus  INitial OV 07/2020 told by the neurologist that she "could have narcolepsy".  She never underwent MSLT for detailed sleep testing in the past >> deferred MSLT due to delayed sleep phase  Last office visit, she had several concerns about Trelegy including weight gain, rising HbA1c.  After much discussion we switch her to [facility] but she felt that this was not effective.  Her PCP prescribed Advair and this was not effective either so she went back to Trelegy Uses albuterol MDI to 3 times a week, has not needed albuterol nebulizer since she went on Trelegy and wonders when she should use this  She underwent carotid duplex which picked up multiple enlarged lymph nodes in her neck, this was evaluated by PCP  Med review shows enalapril but she denies cough or throat clearing   Significant tests/ events reviewed  05/2020 Eos 300   03/2020 PFT: FVC 61%, FEV1 44%, ratio 56, 8% change in FEV1 post. No TLC. DLC 70%/17.23   05/2020 HST -AHI 7.7/hour, nadir saturation 84%, 11 minutes with saturation less than 88%  Review of Systems neg for any significant sore throat, dysphagia, itching, sneezing, nasal congestion or excess/ purulent secretions, fever, chills, sweats, unintended wt loss,  pleuritic or exertional cp, hempoptysis, orthopnea pnd or change in chronic leg swelling. Also denies presyncope, palpitations, heartburn, abdominal pain, nausea, vomiting, diarrhea or change in bowel or urinary habits, dysuria,hematuria, rash, arthralgias, visual complaints, headache, numbness weakness or ataxia.     Objective:   Physical Exam  Gen. Pleasant, obese, in no distress ENT - no lesions, no post nasal drip Neck: No JVD, no thyromegaly, no carotid bruits Lungs: no use of accessory muscles, no dullness to percussion, decreased without rales or rhonchi  Cardiovascular: Rhythm regular, heart sounds  normal, no murmurs or gallops, no peripheral edema Musculoskeletal: No deformities, no cyanosis or clubbing , no tremors        Assessment & Plan:   Cervical lymphadenopathy -picked up on carotid ultrasound, followed by PCP, I asked her to discuss with oncology at her next follow-up in March

## 2021-08-12 NOTE — Assessment & Plan Note (Signed)
She has delayed sleep phase and mild OSA, does not want further evaluation or treatment.  I encouraged her to focus on weight loss was the most important intervention

## 2021-08-13 ENCOUNTER — Telehealth (HOSPITAL_COMMUNITY): Payer: Self-pay

## 2021-08-13 NOTE — Telephone Encounter (Signed)
Called patient to see if she is interested in the Pulmonary Rehab Program. Patient expressed interest. Explained scheduling process, patient verbalized understanding. Also adv pt where we are with scheduling for PR and that we have a backlog of 1-4 months. 

## 2021-08-19 ENCOUNTER — Other Ambulatory Visit: Payer: Self-pay | Admitting: Pulmonary Disease

## 2021-09-02 ENCOUNTER — Ambulatory Visit: Payer: 59 | Admitting: Cardiology

## 2021-10-09 ENCOUNTER — Encounter (HOSPITAL_COMMUNITY): Payer: Self-pay

## 2021-10-19 ENCOUNTER — Other Ambulatory Visit: Payer: Self-pay | Admitting: Pulmonary Disease

## 2021-10-20 ENCOUNTER — Encounter (HOSPITAL_COMMUNITY): Payer: Self-pay

## 2021-10-20 ENCOUNTER — Ambulatory Visit: Payer: 59 | Admitting: Family Medicine

## 2021-10-21 ENCOUNTER — Ambulatory Visit: Payer: 59 | Admitting: Family Medicine

## 2021-10-28 ENCOUNTER — Ambulatory Visit: Payer: 59 | Admitting: Family Medicine

## 2021-10-29 ENCOUNTER — Telehealth: Payer: Self-pay | Admitting: Hematology

## 2021-10-29 NOTE — Telephone Encounter (Signed)
Rescheduled upcoming appointment per provider's request. Patient stated she wanted to see the MD due to new concerns and rescheduled appointment. Patient is aware of changes. ?

## 2021-11-06 ENCOUNTER — Inpatient Hospital Stay: Payer: Self-pay | Admitting: Hematology

## 2021-11-06 ENCOUNTER — Ambulatory Visit: Payer: 59 | Admitting: Hematology

## 2021-11-06 ENCOUNTER — Ambulatory Visit: Payer: Self-pay | Admitting: Adult Health

## 2021-11-06 ENCOUNTER — Other Ambulatory Visit: Payer: Self-pay

## 2021-11-06 ENCOUNTER — Inpatient Hospital Stay: Payer: Self-pay

## 2021-11-06 ENCOUNTER — Other Ambulatory Visit: Payer: 59

## 2021-11-10 ENCOUNTER — Telehealth (HOSPITAL_COMMUNITY): Payer: Self-pay

## 2021-11-10 IMAGING — MG DIGITAL DIAGNOSTIC BILAT W/ TOMO W/ CAD
8 of 13 series · 8 of 37 positions shown · non-contrast
Comparison: Previous exam(s).

ACR Breast Density Category a: The breast tissue is almost entirely
fatty.

CLINICAL DATA: 65-year-old female for annual follow-up. History of
LEFT breast cancer and lumpectomy in 1816. History of RIGHT breast
complex sclerosing lesion and excision in 1816.

EXAM:
DIGITAL DIAGNOSTIC BILATERAL MAMMOGRAM WITH CAD AND TOMO

[L CC]
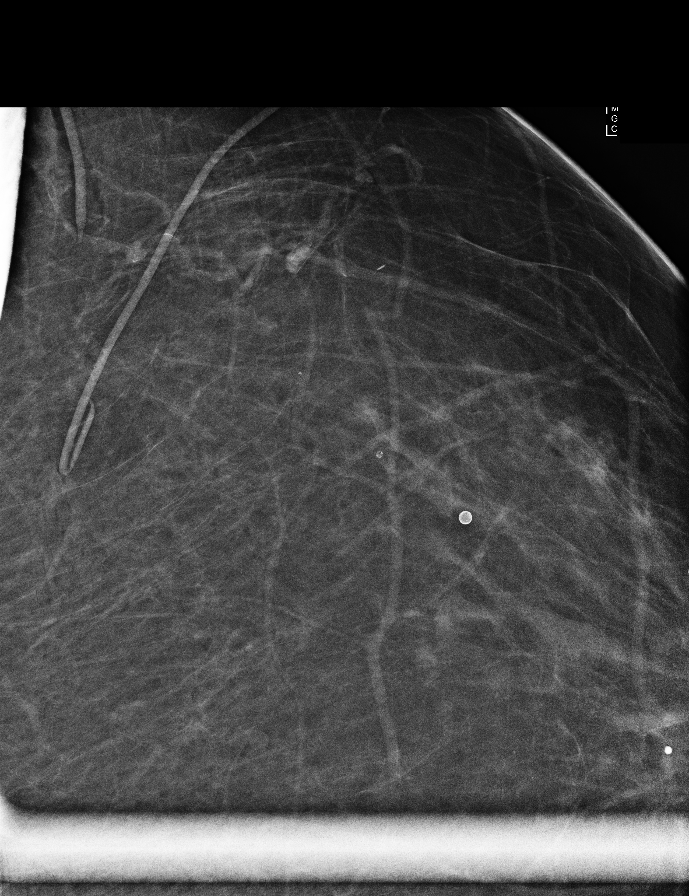

[L MLO synth-2D (1 of 2)]
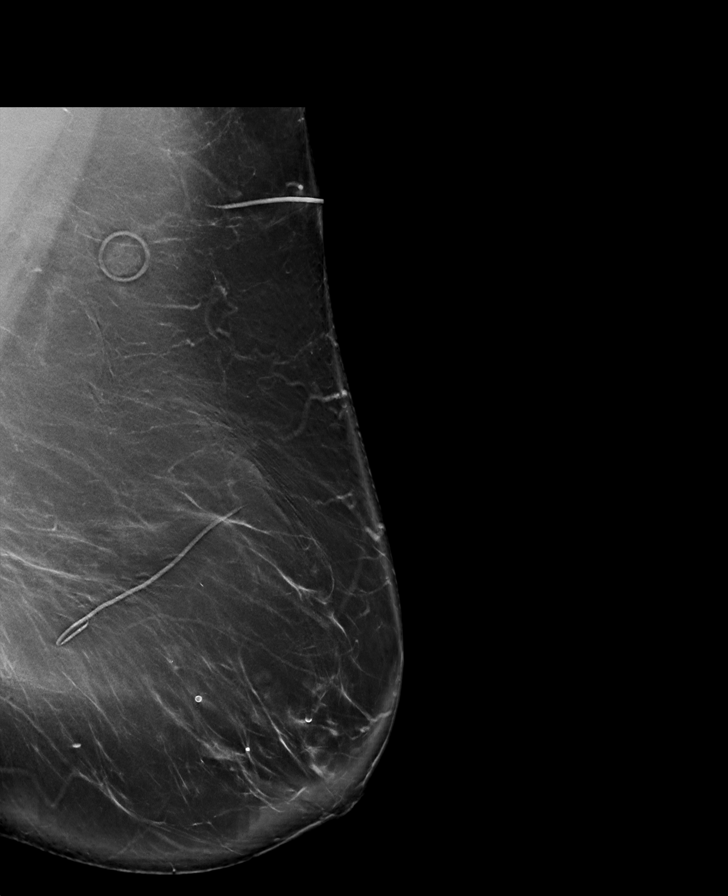

[R MLO synth-2D (1 of 2)]
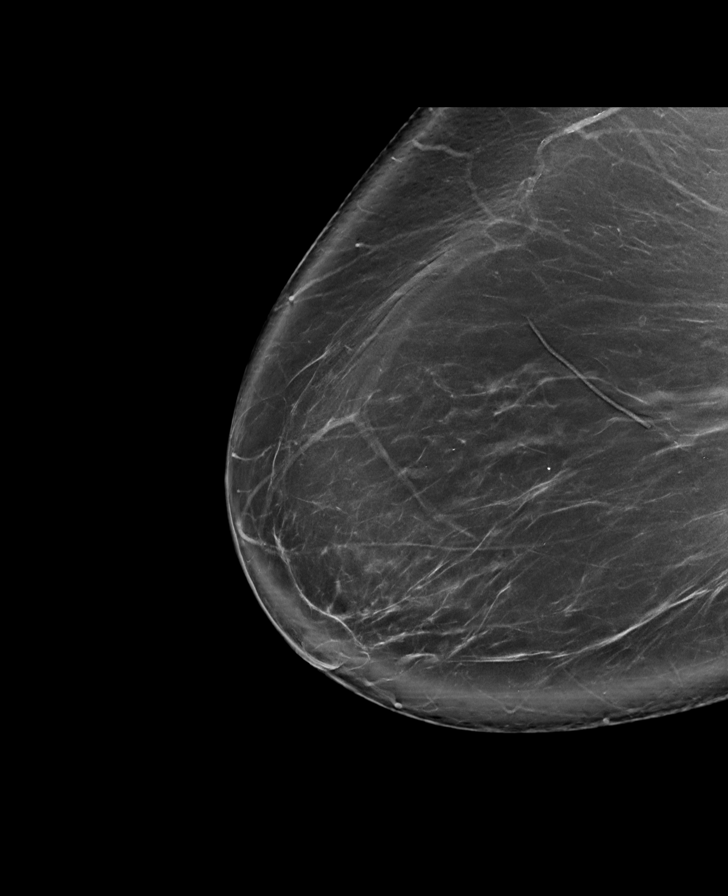

[L CC synth-2D]
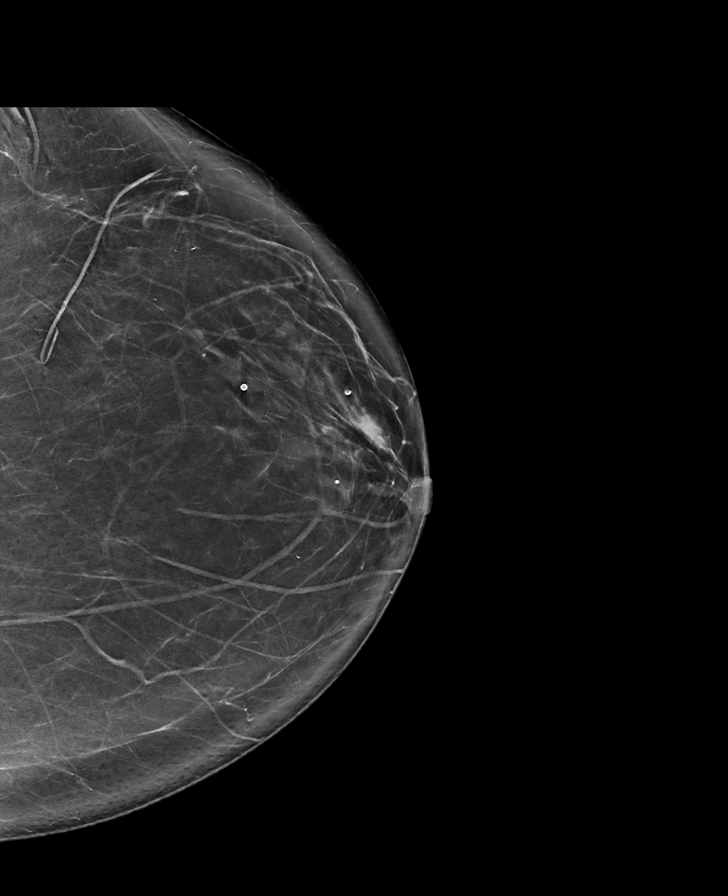

[R CC synth-2D]
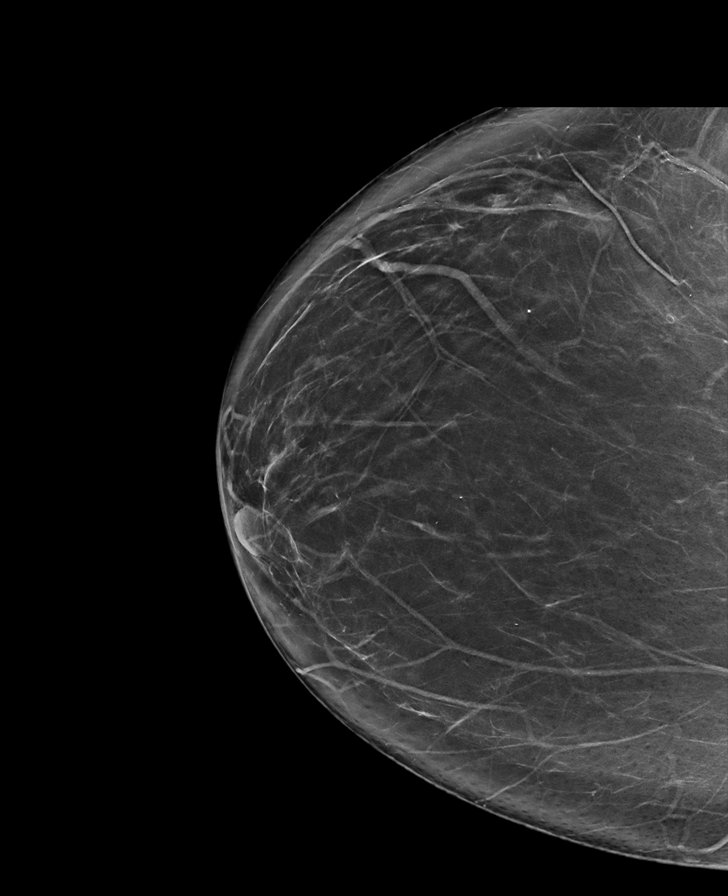

[L MLO synth-2D (2 of 2)]
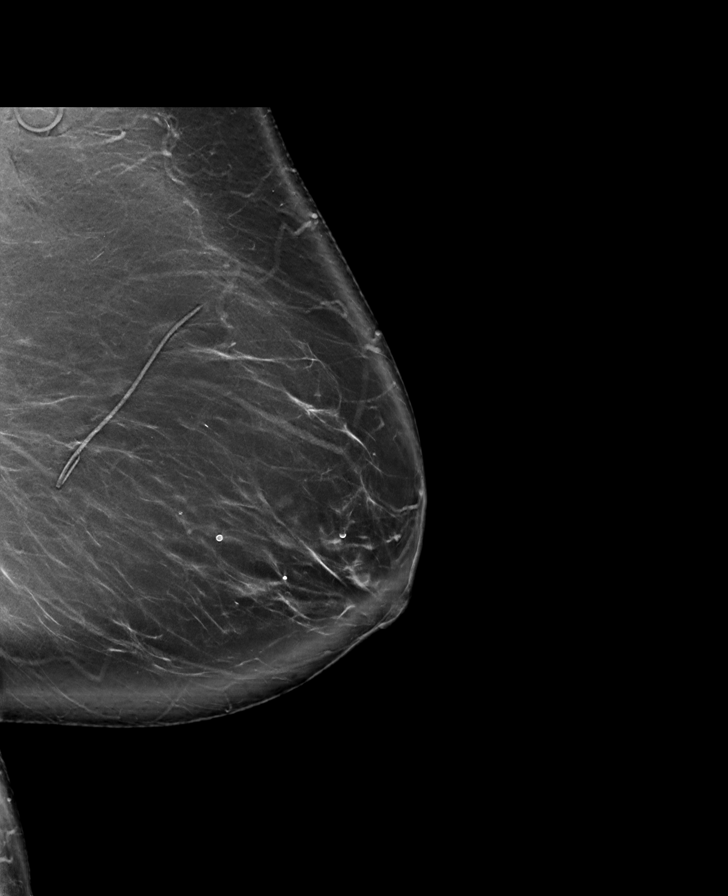

[R MLO synth-2D (2 of 2)]
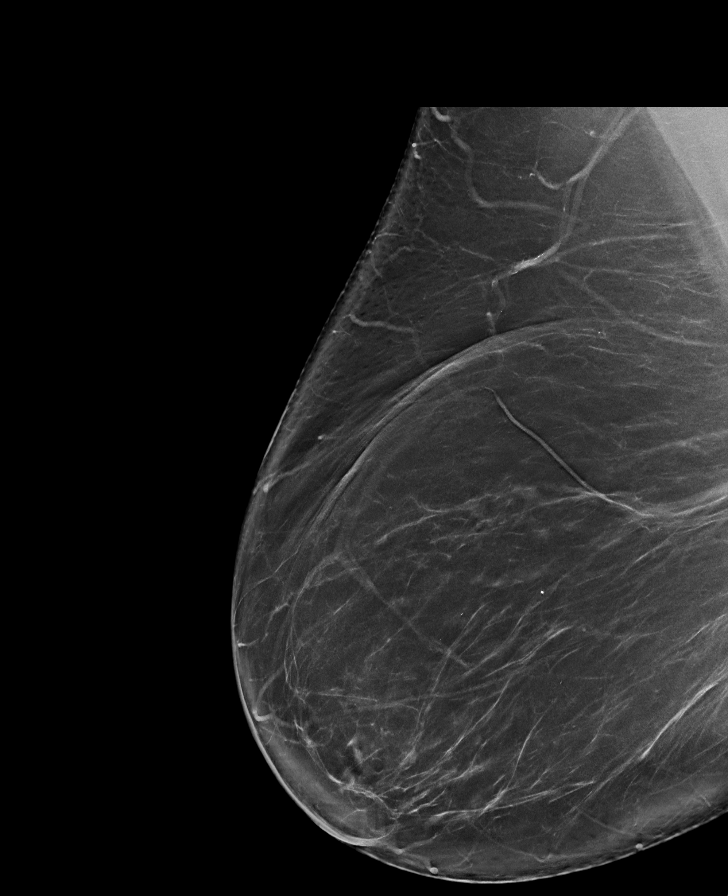

[L CC tomo · tomo slice 43/84.0]
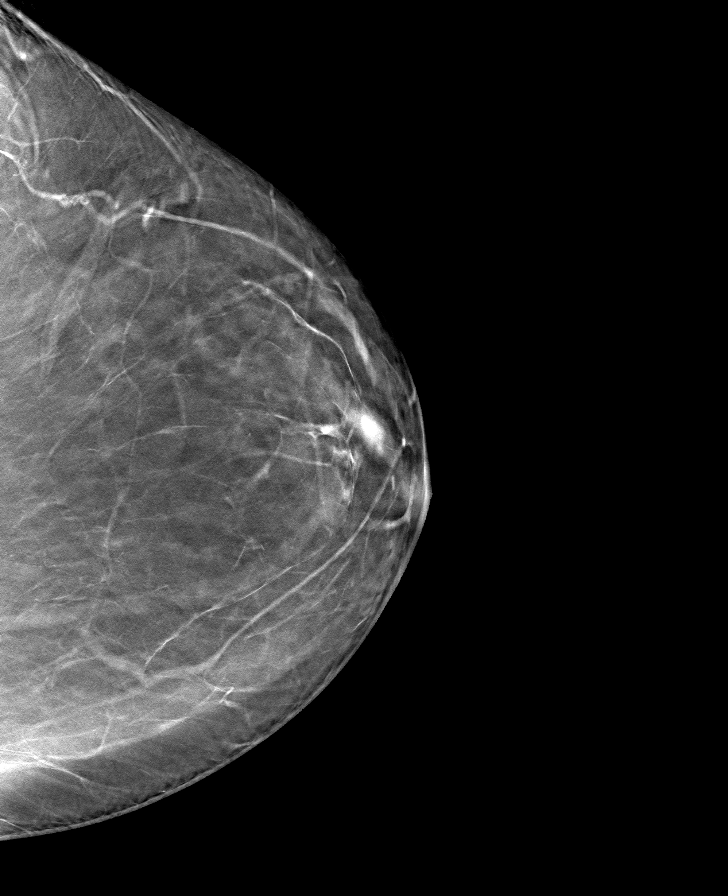

[8 of 37 positions shown; findings below may reference images not displayed]

FINDINGS: 2D and 3D full field views of both breasts and a magnification view
of the lumpectomy site demonstrate no suspicious mass, nonsurgical
distortion or worrisome calcifications.

Surgical changes within both breasts are noted.

Mammographic images were processed with CAD.
IMPRESSION: No evidence of breast malignancy.

RECOMMENDATION:
Bilateral diagnostic mammogram in 1 year.

I have discussed the findings and recommendations with the patient.
If applicable, a reminder letter will be sent to the patient
regarding the next appointment.

BI-RADS CATEGORY  2: Benign.

## 2021-11-10 NOTE — Telephone Encounter (Signed)
No response from pt.  Closed referral  

## 2021-11-18 ENCOUNTER — Ambulatory Visit: Payer: Self-pay | Admitting: Family Medicine

## 2021-11-21 NOTE — Progress Notes (Signed)
?  ?Cardiology Office Note ? ? ?Date:  11/27/2021  ? ?ID:  Kelli Horn, DOB 1956/04/29, MRN [id number] ? ?PCP:  [name], MD  ?Cardiologist:   Ermelinda Eckert [place]ique, MD  ? ?No chief complaint on file. ? ? ?  ?History of Present Illness: ?Kelli Horn is a 66 y.o. female who is seen for follow up  SOB. She has a history of DM, HLD,  and HTN. Also severe PAD but nonobstructive CAD. She was seen in the past by Dr Debara Pickett in 2017. Myoview at that time was normal. Echo showed severe LVH with normal systolic function and normal valves. Ecg was normal.  ? ?Other studies of note include plain X rays dating back to 2017 that showed significant aortic atherosclerosis. She recently had Abdominal/pelvic CT on 04/05/20 showing severe bilateral common iliac stenosis. 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 There was medical renal disease. She has an [name] report apparently showing a 2.9 cm gallstone but no gallstone noted on CT. ? ?We evaluated her with cardiac cath showing mild nonobstructive CAD. No significant right subclavian/innominate gradient. Mildly elevated LVEDP. Echo showed mild LVH with normal systolic function. Arterial doppler studies show severe bilateral iliac disease with moderate bilateral reduced ABIs and reduced toe pressures. No significant carotid disease but bilateral subclavian disease noted. She did have PFTs showing severe obstructive disease. Pulmonary evaluation was done who felt she had predominantly asthma. Was started on Trelegy and she noted significant improvement in her breathing. She did have a home sleep study in October showing mild OSA. Being managed by Dr [name]. She also had colonoscopy in October showing multiple polyps that were removed.  ? ?She has also was diagnosed with left breast carcinoma. She underwent bilateral breast lumpectomies on October 8. She then underwent total hysterectomy with BSO for endometrial CA on 07/11/20. She decided  against RT for her breast CA. Followed by Dr Burr Medico. 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 No endovascular options. Was seen by [facility]- Dr [name]. It was felt that surgery would be beneficial but with higher risk due to morbid obesity. Was referred to Weight management to see if she could lose weight to improve her operative risk. She was placed on Rybelsius but unable to tolerate. On his last visit with Dr [name] in August she had gained 10 lbs and continued conservative therapy recommended unless symptoms progress.  ? ?On follow up she reports she had Covid in January. Had significant SOB. Reports SOB has persisted. No cough. Has not been taking HCTZ. Only taking Crestor once a week due to myalgias. She did have carotid dopplers in September with incidental finding of enlarged lymph nodes in her neck. This has not been evaluated yet.  ? ? ? ?Past Medical History:  ?Diagnosis Date  ? Acid reflux   ? Anxiety   ? Asthma   ? Atherosclerosis   ? Back pain   ? Breast cancer ([place]) 05/2020  ? left breast DCIS  ? Chest pain   ? Chronic kidney disease   ? Right side question infarction  ? Constipation   ? COPD (chronic obstructive pulmonary disease) ([place])   ? Diabetes mellitus without complication ([place])   ? no meds  ? Edema of both lower extremities   ? GERD (gastroesophageal reflux disease)   ? Hyperlipidemia   ? Hypertension   ? Joint pain   ? Kidney  problem   ? Obesity   ? PAD (peripheral artery disease) ([place])   ? Peripheral vascular disease ([place])   ? severe athrosclerosis  ? Sleep apnea   ?  no cpap at this time. Sleep study done Jun 20 2020  ? SOB (shortness of breath)   ? Swallowing difficulty   ? ? ?Past Surgical History:  ?Procedure Laterality Date  ? ABDOMINAL AORTOGRAM W/LOWER EXTREMITY Bilateral 08/08/2020  ? Procedure: ABDOMINAL AORTOGRAM W/LOWER EXTREMITY;  Surgeon: [name], MD;  Location: [facility];  Service: Cardiovascular;   Laterality: Bilateral;  ? APPENDECTOMY    ? BIOPSY  06/27/2020  ? Procedure: BIOPSY;  Surgeon: [name], MD;  [facility];  Service: Endoscopy;;  ? BREAST LUMPECTOMY WITH RADIOACTIVE SEED AND SENTINEL LYMPH NODE BIOPSY Bilateral 06/07/2020  ? Procedure: BILATERAL BREAST LUMPECTOMY WITH RADIOACTIVE SEED AND LEFT SENTINEL LYMPH NODE BIOPSY;  Surgeon: [name], MD;  Location: [facility];  Service: General;  Laterality: Bilateral;  PEC BLOCK  ? COLONOSCOPY WITH PROPOFOL N/A 06/27/2020  ? Procedure: COLONOSCOPY WITH PROPOFOL;  Surgeon: [name], MD;  [facility];  Service: Endoscopy;  Laterality: N/A;  ? LEFT HEART CATH AND CORONARY ANGIOGRAPHY N/A 04/23/2020  ? Procedure: LEFT HEART CATH AND CORONARY ANGIOGRAPHY;  Surgeon: Martinique, Timia Casselman M, MD;  Location: [facility];  Service: Cardiovascular;  Laterality: N/A;  ? POLYPECTOMY  06/27/2020  ? Procedure: POLYPECTOMY;  Surgeon: [name], MD;  [facility];  Service: Endoscopy;;  ? ROBOTIC ASSISTED TOTAL HYSTERECTOMY WITH BILATERAL SALPINGO OOPHERECTOMY N/A 07/11/2020  ? Procedure: XI ROBOTIC ASSISTED TOTAL HYSTERECTOMY WITH BILATERAL SALPINGO OOPHORECTOMY;  Surgeon: [name], MD;  [facility];  Service: Gynecology;  Laterality: N/A;  ? ? ? ?Current Outpatient Medications  ?Medication Sig Dispense Refill  ? albuterol (VENTOLIN HFA) 108 (90 Base) MCG/ACT inhaler TAKE 2 PUFFS BY MOUTH EVERY 6 HOURS AS NEEDED FOR WHEEZE OR SHORTNESS OF BREATH 18 each 2  ? amLODipine (NORVASC) 5 MG tablet TAKE 1 TABLET BY MOUTH EVERY DAY 90 tablet 3  ? aspirin EC 81 MG tablet Take 81 mg by mouth daily. Swallow whole.    ? enalapril (VASOTEC) 10 MG tablet TAKE 2 TABLETS BY MOUTH TWICE A DAY 360 tablet 3  ? fluticasone (FLONASE) 50 MCG/ACT nasal spray Place 1 spray into both nostrils daily. 16 g 6  ? hydrOXYzine (VISTARIL) 25 MG capsule Take 25 mg by mouth 4 (four) times daily as needed.    ? Polyethyl  Glycol-Propyl Glycol 0.4-0.3 % SOLN Place 1 drop into both eyes 3 (three) times daily as needed (dry eyes).     ? rosuvastatin (CRESTOR) 5 MG tablet TAKE 5 MG TWICE A WEEK 90 tablet 2  ? TRELEGY ELLIPTA 100-62.5-25 MCG/ACT AEPB INHALE 1 PUFF BY MOUTH EVERY DAY 60 each 6  ? hydrochlorothiazide (MICROZIDE) 12.5 MG capsule Take 12.5 mg by mouth daily. (Patient not taking: Reported on 11/27/2021)    ? ?No current facility-administered medications for this visit.  ? ? ?Allergies:   Beta adrenergic blockers, Carvedilol, Peanut-containing drug products, and Statins  ? ? ?Social History:  The patient  reports that she quit smoking about 17 years ago. Her smoking use included cigarettes. She has a 20.00 pack-year smoking history. She has never used smokeless tobacco. She reports that she does not drink alcohol and does not use drugs.  ? ?Family History:  The patient's family history includes AAA (abdominal aortic aneurysm) in  her father; Aneurysm in her father and paternal uncle; Breast cancer in her maternal aunt; Diabetes in her father and another family member; Heart attack (age of onset: 23) in her father; Heart disease in her paternal uncle; High Cholesterol in her father; Hypertension in her father; Lung cancer in her mother; Obesity in her father; Schizophrenia in her mother; Stroke in her father.  ? ? ?ROS:  Please see the history of present illness.   Otherwise, review of systems are negative.   All other systems are reviewed and negative.  ? ? ?PHYSICAL EXAM: ?VS:  BP (!) 172/84   Pulse 83   Ht '5\' 10"'$  (1.778 m)   Wt (!) 330 lb 3.2 oz (149.8 kg)   LMP 12/09/2013   SpO2 97%   BMI 47.38 kg/m?  , BMI Body mass index is 47.38 kg/m?. ?GEN: Well nourished, morbidly obese, in no acute distress  ?HEENT: normal  ?Neck: no JVD,  Left carotid and bilateral subclavian bruits.  ?Cardiac: RRR; no murmurs, rubs, or gallops,no edema. Femoral and pedal pulses are not palpable.  ?Respiratory:  clear to auscultation bilaterally,  normal work of breathing ?GI: morbidly obese with large panus. Soft, nontender, nondistended, + BS. I don't hear renal bruits ?MS: no deformity or atrophy  ?Skin: warm and dry, no rash ?Neuro:  Strength and sensation ar

## 2021-11-25 ENCOUNTER — Telehealth: Payer: Self-pay | Admitting: Hematology

## 2021-11-25 NOTE — Telephone Encounter (Signed)
.  Called pt per 3/28 inbasket , Patient was unavailable, a message with appt time and date was left with number on file.   ?

## 2021-11-27 ENCOUNTER — Encounter: Payer: Self-pay | Admitting: Cardiology

## 2021-11-27 ENCOUNTER — Inpatient Hospital Stay: Payer: Self-pay | Admitting: Hematology

## 2021-11-27 ENCOUNTER — Inpatient Hospital Stay: Payer: Self-pay

## 2021-11-27 ENCOUNTER — Ambulatory Visit (INDEPENDENT_AMBULATORY_CARE_PROVIDER_SITE_OTHER): Payer: 59 | Admitting: Cardiology

## 2021-11-27 VITALS — BP 172/84 | HR 83 | Ht 70.0 in | Wt 330.2 lb

## 2021-11-27 DIAGNOSIS — E7800 Pure hypercholesterolemia, unspecified: Secondary | ICD-10-CM

## 2021-11-27 DIAGNOSIS — R0602 Shortness of breath: Secondary | ICD-10-CM | POA: Diagnosis not present

## 2021-11-27 DIAGNOSIS — I739 Peripheral vascular disease, unspecified: Secondary | ICD-10-CM | POA: Diagnosis not present

## 2021-11-27 DIAGNOSIS — I10 Essential (primary) hypertension: Secondary | ICD-10-CM

## 2021-11-27 MED ORDER — HYDROCHLOROTHIAZIDE 12.5 MG PO CAPS: 12.5000 mg | ORAL_CAPSULE | Freq: Every day | ORAL | 3 refills | Status: DC

## 2021-12-02 ENCOUNTER — Ambulatory Visit: Payer: Self-pay | Admitting: Family Medicine

## 2021-12-24 ENCOUNTER — Telehealth: Payer: Self-pay | Admitting: Cardiology

## 2021-12-24 DIAGNOSIS — M7989 Other specified soft tissue disorders: Secondary | ICD-10-CM

## 2021-12-24 NOTE — Telephone Encounter (Signed)
Spoke to patient she stated she had lab work done with PCP Dr.Elkins.Stated Dr.Jordan wanted to know so he could review cholesterol.Advised I will call Dr.Elkins office in am.I will call back after Dr.Jordan reviews. ?

## 2021-12-24 NOTE — Telephone Encounter (Signed)
Patient calling to say she had blood work done calling to see what else need to be done. Please advise  ?

## 2021-12-24 NOTE — Telephone Encounter (Signed)
Patient stated she was to call Dr. Martinique after blood work. I don't see current results yet. ?

## 2021-12-25 ENCOUNTER — Other Ambulatory Visit: Payer: Self-pay

## 2021-12-25 DIAGNOSIS — E785 Hyperlipidemia, unspecified: Secondary | ICD-10-CM

## 2021-12-25 DIAGNOSIS — E7800 Pure hypercholesterolemia, unspecified: Secondary | ICD-10-CM

## 2021-12-25 NOTE — Telephone Encounter (Signed)
Called patient left message on personal voice mail.Dr.Jordan reviewed lab from [place].He advised LDL too high 137.He wants you to see Pharm D in [facility].Scheduler will call back with appointment. ?

## 2021-12-25 NOTE — Progress Notes (Signed)
amb  

## 2021-12-26 MED ORDER — FUROSEMIDE 20 MG PO TABS
20.0000 mg | ORAL_TABLET | Freq: Every day | ORAL | 6 refills | Status: DC
Start: 2021-12-26 — End: 2022-01-01

## 2021-12-26 NOTE — Addendum Note (Signed)
Addended by: Waylan Rocher on: 12/26/2021 12:01 PM ? ? Modules accepted: Orders ? ?

## 2021-12-26 NOTE — Telephone Encounter (Signed)
Pt informed of providers result & recommendations. Pt verbalized understanding. No further questions . She will continue to take BP/HR and start lasix '20mg'$  daily. She will call with anything further. ? ?

## 2021-12-26 NOTE — Telephone Encounter (Signed)
Patient followed up and scheduled PharmD appointment for 5/04. However, she states her main concern from her lab results was the BNP. She states her BNP was at 185.3 and she is very worried. She is requesting a call back ASAP for advisement. ?

## 2021-12-26 NOTE — Telephone Encounter (Signed)
Returned call to pt she states that she has been to her PCP(Wilson Arelia Sneddon, MD) and had labwork done and states that she had asked them to forward over to [name]. I do not see that this we have received this as of yet. The only note that I see is the note from [place] about the cholesterol. And pt has [facility] appt to discuss this. Pt states that her BNP lab at that time was 185.3 last time it was done was 54(01-2020). Last BNP in EPIC 2017 260.0. She states that she knows that this means that she will have a major cardiac event within the next 90 days. Informed pt that this is not necessarily true. She c/o SOB and swelling. Her last weight in EPIC is 330# and she states that at PCP around 12-16-21 was 335# this is up 5# at least (differences between scales) she states that her PCP was "going" to send in lasix rx but has not as of yet. Informed pt to call over there and let them know that this was not sent. She does not take BP or weight at home. Informed pt that this is the only way to track swelling/wt gain over the phone. Dr Martinique is in the [place] lab today. Sheryl is not currently in the office, verbalized understanding. I will forward this for review and comment. ?

## 2021-12-29 NOTE — Telephone Encounter (Signed)
Spoke to patient Dr.Jordan reviewed lab from Dr.Elkin's.He advised LDL cholesterol elevated 137.He wants you to see Pharm D in [facility].Appointment scheduled in [facility] 5/4 at 3:30 pm. ?

## 2021-12-31 ENCOUNTER — Other Ambulatory Visit: Payer: Self-pay

## 2021-12-31 DIAGNOSIS — D0512 Intraductal carcinoma in situ of left breast: Secondary | ICD-10-CM

## 2022-01-01 ENCOUNTER — Other Ambulatory Visit: Payer: Self-pay

## 2022-01-01 ENCOUNTER — Inpatient Hospital Stay: Payer: 59 | Attending: Hematology | Admitting: Hematology

## 2022-01-01 ENCOUNTER — Telehealth: Payer: Self-pay

## 2022-01-01 ENCOUNTER — Inpatient Hospital Stay: Payer: 59

## 2022-01-01 ENCOUNTER — Ambulatory Visit (INDEPENDENT_AMBULATORY_CARE_PROVIDER_SITE_OTHER): Payer: 59 | Admitting: Pharmacist Clinician (PhC)/ Clinical Pharmacy Specialist

## 2022-01-01 VITALS — BP 165/70 | HR 91 | Temp 98.1°F | Resp 19 | Ht 70.0 in | Wt 329.3 lb

## 2022-01-01 DIAGNOSIS — Z170 Estrogen receptor positive status [ER+]: Secondary | ICD-10-CM | POA: Diagnosis not present

## 2022-01-01 DIAGNOSIS — J449 Chronic obstructive pulmonary disease, unspecified: Secondary | ICD-10-CM | POA: Insufficient documentation

## 2022-01-01 DIAGNOSIS — D125 Benign neoplasm of sigmoid colon: Secondary | ICD-10-CM | POA: Insufficient documentation

## 2022-01-01 DIAGNOSIS — I129 Hypertensive chronic kidney disease with stage 1 through stage 4 chronic kidney disease, or unspecified chronic kidney disease: Secondary | ICD-10-CM | POA: Diagnosis not present

## 2022-01-01 DIAGNOSIS — D1779 Benign lipomatous neoplasm of other sites: Secondary | ICD-10-CM | POA: Diagnosis not present

## 2022-01-01 DIAGNOSIS — K5730 Diverticulosis of large intestine without perforation or abscess without bleeding: Secondary | ICD-10-CM | POA: Insufficient documentation

## 2022-01-01 DIAGNOSIS — Z9889 Other specified postprocedural states: Secondary | ICD-10-CM | POA: Insufficient documentation

## 2022-01-01 DIAGNOSIS — K219 Gastro-esophageal reflux disease without esophagitis: Secondary | ICD-10-CM | POA: Diagnosis not present

## 2022-01-01 DIAGNOSIS — C541 Malignant neoplasm of endometrium: Secondary | ICD-10-CM | POA: Insufficient documentation

## 2022-01-01 DIAGNOSIS — I10 Essential (primary) hypertension: Secondary | ICD-10-CM | POA: Diagnosis not present

## 2022-01-01 DIAGNOSIS — Z7982 Long term (current) use of aspirin: Secondary | ICD-10-CM | POA: Insufficient documentation

## 2022-01-01 DIAGNOSIS — D0512 Intraductal carcinoma in situ of left breast: Secondary | ICD-10-CM | POA: Diagnosis present

## 2022-01-01 DIAGNOSIS — R590 Localized enlarged lymph nodes: Secondary | ICD-10-CM | POA: Diagnosis not present

## 2022-01-01 DIAGNOSIS — E785 Hyperlipidemia, unspecified: Secondary | ICD-10-CM

## 2022-01-01 DIAGNOSIS — G4730 Sleep apnea, unspecified: Secondary | ICD-10-CM | POA: Insufficient documentation

## 2022-01-01 DIAGNOSIS — I2510 Atherosclerotic heart disease of native coronary artery without angina pectoris: Secondary | ICD-10-CM | POA: Diagnosis not present

## 2022-01-01 DIAGNOSIS — Z79899 Other long term (current) drug therapy: Secondary | ICD-10-CM | POA: Insufficient documentation

## 2022-01-01 DIAGNOSIS — Z90710 Acquired absence of both cervix and uterus: Secondary | ICD-10-CM | POA: Insufficient documentation

## 2022-01-01 DIAGNOSIS — Z86010 Personal history of colonic polyps: Secondary | ICD-10-CM | POA: Diagnosis not present

## 2022-01-01 DIAGNOSIS — M7989 Other specified soft tissue disorders: Secondary | ICD-10-CM

## 2022-01-01 DIAGNOSIS — M1990 Unspecified osteoarthritis, unspecified site: Secondary | ICD-10-CM | POA: Insufficient documentation

## 2022-01-01 DIAGNOSIS — E1122 Type 2 diabetes mellitus with diabetic chronic kidney disease: Secondary | ICD-10-CM | POA: Insufficient documentation

## 2022-01-01 DIAGNOSIS — E1151 Type 2 diabetes mellitus with diabetic peripheral angiopathy without gangrene: Secondary | ICD-10-CM | POA: Insufficient documentation

## 2022-01-01 MED ORDER — FUROSEMIDE 20 MG PO TABS: 20.0000 mg | ORAL_TABLET | Freq: Every day | ORAL | 1 refills | Status: DC

## 2022-01-01 MED ORDER — EZETIMIBE 10 MG PO TABS: 10.0000 mg | ORAL_TABLET | Freq: Every day | ORAL | 3 refills | Status: DC

## 2022-01-01 NOTE — Progress Notes (Signed)
?[facility]   ?[phone] [phone]   ?Clinic Follow up Note  ? ?Patient Care Team: ?[name], MD as PCP - General (Family Medicine) ?Mauro Kaufmann, RN as Oncology Nurse Navigator ?Rockwell Germany, RN as Oncology Nurse Navigator ?[name], MD as Consulting Physician (General Surgery) ?Truitt Merle, MD as Consulting Physician (Hematology) ?Eppie Gibson, MD as Attending Physician (Radiation Oncology) ?[name], MD as Consulting Physician (Gynecologic Oncology) ?Martinique, Peter M, MD as Consulting Physician (Cardiology) ?Georgia Lopes, DO as Consulting Physician [facility]) ?Martyn Ehrich, NP as Nurse Practitioner (Pulmonary Disease) ? ?Date of Service:  01/01/2022 ? ?CHIEF COMPLAINT: f/u of left breast DCIS ? ?CURRENT THERAPY:  ?Surveillance ? ?ASSESSMENT & PLAN:  ?Falen Lehrmann is a 66 y.o. post-menopausal female with  ? ?1. New enlarged neck lymph nodes ?-incidental finding on carotid doppler. ?-she notes she has been dealing with dental issues for over the last year.  ?-Exam showed no significant palpable cervical adenopathy.  She has no clinical concern for head neck cancers. ?-I will order neck CT for further evaluation  ? ?2. Left Breast DCIS, ER+/PR+ ?-s/p b/l lumpectomies with Dr Marlou Starks on 06/07/20 for benign breast lesions. Surgical path showed: left breast-- 1.3cm solid papillary carcinoma without invasive carcinoma, with clear margins; right breast-- negative for malignancy. ?-she opted to not proceed with Adjuvant Radiation.  ?-she ultimately declined prophylactic antiestrogens. ?-most recent mammogram 05/13/21 was benign. ?-she is clinically doing well from a breast cancer standpoint. Physical exam was unremarkable. ?-Will f/u with her in 1 year. ?  ?3. Endometrial Cancer, T1a, Grade I, MSI-stable  ?-presented with spotting, s/p BSO and total hysterectomy by Dr [name] on 07/11/20. Surgical path showed Endometrioid carcinoma, FIGO grade 1,  partially involving endometrial polyp. ?-She did not require adjuvant treatment.  ?-she was previously followed by Dr. [name]. I will refer her to Dr. Berline Lopes. ?  ?4. Comorbidites: HTN, DM, DDD/Arthritis, HLD, CAD s/p cardiac cath in 03/2020 ?-Managed on Amlodipine, baby aspirin, enalapril, Trelegy, Hydroxyzine, Nitroglycerin ?-Her arthritis is managed on ibuprofen and Robaxin.  ?-s/p abdominal aortogram with LE for stent placement on 08/08/20 due to arthrosclerosis.  ? ?  ?PLAN:  ?-neck CT to be done soon ? -phone visit to discuss results several days later ?-Mammogram due 05/2022 ?-F/u in 1 year.  ?-We will refer her back to GYN Dr. Berline Lopes for follow-up of her endometrial cancer ?-Referral to [facility] dentistry for her dental care ? ? ?No problem-specific Assessment & Plan notes found for this encounter. ? ? ?SUMMARY OF ONCOLOGIC HISTORY: ?Oncology History Overview Note  ?Cancer Staging ?Ductal carcinoma in situ (DCIS) of left breast ?Staging form: Breast, AJCC 8th Edition ?- Clinical stage from 05/22/2020: Stage 0 (cTis (DCIS), cN0, cM0, ER+, PR+) - Unsigned ? ?  ?Ductal carcinoma in situ (DCIS) of left breast  ?04/26/2020 Mammogram  ? IMPRESSION: ?1. There is a possible subtle distortion in the lateral right breast ?without a definite sonographic correlate. ?  ?2.  No evidence of right axillary lymphadenopathy. ?  ?3. There are 2 adjacent abutting masses in the left breast at 3:30, 7cmfn ?which are indeterminate. One of these is an ?irregular mass measuring 9 x 3 x 5 mm. An adjacent abutting mass ?measures 4 x 3 x 4 mm. ?  ?4.  No evidence of left axillary lymphadenopathy. ?  ?  ?05/10/2020 Initial Biopsy  ? Diagnosis ?1. Breast, left, needle core biopsy, 3:30 o'clock ?- CARCINOMA INVOLVING PAPILLARY LESION;  SEE COMMENT. ?2. Breast, right, needle core biopsy, lower, outer quadrant ?- COMPLEX SCLEROSING LESION WITH CALCIFICATIONS. ?Microscopic Comment ?1. The differential diagnosis includes invasive papillary carcinoma  and solid papillary carcinoma. ?Immunohistochemistry for E-cadherin is positive. CK5/6 is negative. SMM-1 is positive, Calponin is equivocal and ?P63 is negative. ? ? ?1. PROGNOSTIC INDICATORS ?Results: ?IMMUNOHISTOCHEMICAL AND MORPHOMETRIC ANALYSIS PERFORMED MANUALLY ?Estrogen Receptor: 100%, POSITIVE, STRONG STAINING INTENSITY ?Progesterone Receptor: 100%, POSITIVE, STRONG STAINING INTENSITY ?  ?05/17/2020 Initial Diagnosis  ? Ductal carcinoma in situ (DCIS) of left breast ?  ?06/07/2020 Surgery  ? BILATERAL BREAST LUMPECTOMY WITH RADIOACTIVE SEED AND LEFT SENTINEL LYMPH NODE BIOPSY by Dr Marlou Starks ?  ?06/07/2020 Pathology Results  ? FINAL MICROSCOPIC DIAGNOSIS:  ? ?A. BREAST, LEFT, LUMPECTOMY:  ?- Solid papillary carcinoma without invasive carcinoma, 1.3 cm.  ?- Margins of resection are not involved (Closest margin: 2 mm, medial).  ?- Biopsy site.  ?- See oncology table.  ? ?B. BREAST, RIGHT, LUMPECTOMY:  ?- Fibrocystic change.  ?- Biopsy site.  ? ?C. SENTINEL LYMPH NODE, LEFT #1, BIOPSY:  ?- One lymph node, negative for carcinoma (0/1).  ?  ?Endometrial cancer ([place])  ?06/27/2020 Procedure  ? Colonoscopy by Dr Michail Sermon  ?IMPRESSION ?Preparation of the colon was fair. ?- One 7 mm polyp in the cecum, removed with a hot snare. Resected and retrieved. ?- Seven 3 to 7 mm polyps in the sigmoid colon, removed with a hot snare. Resected and ?retrieved. ?- One 9 mm polyp in the descending colon, removed with a hot snare. Resected and ?retrieved. ?- Three 1 to 4 mm polyps in the sigmoid colon, removed with a cold biopsy forceps. ?Resected and retrieved. ?- Two 3 to 7 mm polyps in the rectum, removed with a hot snare. Resected and retrieved. ?- Diverticulosis in the sigmoid colon. ?- Medium-sized lipoma in the ascending colon. ?- Internal hemorrhoids. ?- Non-thrombosed external hemorrhoids and hypertrophied anal papilla(e) found on ?perianal exam. ? ? ? ?FINAL MICROSCOPIC DIAGNOSIS:  ? ?A. COLON, CECUM/SIGMOID, POLYPECTOMY:  ?-  Tubular adenoma without high-grade dysplasia or malignancy  ?- Hyperplastic polyp(s)  ? ?B. COLON, DESCENDING, POLYPECTOMY:  ?- Hyperplastic polyp  ? ?C. COLON, SIGMOID, POLYPECTOMY:  ?- Hyperplastic polyp(s)  ? ?D. RECTUM, POLYPECTOMY:  ?- Hyperplastic polyp(s)  ?  ?07/11/2020 Surgery  ? XI ROBOTIC ASSISTED TOTAL HYSTERECTOMY WITH BILATERAL SALPINGO OOPHORECTOMY by Dr [name] ?  ?07/11/2020 Initial Diagnosis  ? Endometrial cancer ([place]) ?  ?07/11/2020 Initial Biopsy  ? FINAL MICROSCOPIC DIAGNOSIS:  ? ?A. UTERUS, CERVIX, BILATERAL FALLOPIAN TUBES AND OVARIES:  ?Uterus:  ?-  Endometrioid carcinoma, FIGO grade 1, partially involving endometrial  ?polyp  ?-  Leiomyoma (4.0 cm; largest)  ?-  See oncology table and comment below  ? ?Cervix:  ?-  Benign endocervical polyp  ?-  No carcinoma identified  ? ?Bilateral Ovaries:  ?-  No carcinoma identified  ? ?Bilateral Fallopian tubes:  ?-  No carcinoma identified  ? ? ?Mismatch Repair Protein (IHC)  ? ?SUMMARY INTERPRETATION: NORMAL  ? ? ?IHC EXPRESSION RESULTS  ? ?TEST           RESULT  ?MLH1:          Preserved nuclear expression  ?MSH2:          Preserved nuclear expression  ?MSH6:          Preserved nuclear expression  ?PMS2:          Preserved nuclear expression  ?  ? ? ? ?INTERVAL HISTORY:  ?  [name] is here for a follow up of DCIS. She was last seen by me in 10/2020. She presents to the clinic alone. ?She reports she hasn't been able to undergo vascular surgery due to her weight. She notes she did not find the weight management clinic helpful. ?She tells me about how she was found to have enlarged lymph nodes in her neck in 05/2021. I explained these would not be related to her noninvasive breast cancer, likely secondary to issues going on with her mouth or throat. She goes on to explain she has been dealing with teeth issues for over a year. ?She tells me about her recent difficulty with getting her DEXA completed at [facility]. ?  ?All other systems  were reviewed with the patient and are negative. ? ?MEDICAL HISTORY:  ?Past Medical History:  ?Diagnosis Date  ? Acid reflux   ? Anxiety   ? Asthma   ? Atherosclerosis   ? Back pain   ? Breast cancer ([place]

## 2022-01-01 NOTE — Progress Notes (Signed)
01/02/2022 ?Kelli Horn ?10-16-1955 ?[id number] ? ? ?HPI:  Kelli Horn is a 66 y.o. female patient of Dr Martinique, who presents today for a lipid clinic evaluation.  See pertinent past medical history below.  In the office today her BP is rather elevated, and she notes that she recently had to cut amlodipine to 2.5 mg because of lower extremity edema and is tolerating that dose well.  She states she was told to take furosemide 20 mg daily, but her PCP called in 80 mg tablets, and she spent all day running to the bathroom with one dose.   ? ?Patient here to discuss cholesterol options, but with her elevated pressure will review those medications as well.  Of note she will be 66 next month, and is in the process of signing up for Medicare.  She is trying to determine if she can afford a PDP or if she will just go with Medicare A and B.   ? 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 [name]  ?Morbid obesity Did not work out at [employer],   ?cancer Breast s/p bilateral lumpectomies; endometrial  s/p hysterectomy/BSO  ? ? ?Current Medications: rosuvastatin 5 mg weekly ? Enalapril 20 mg bid, amlodipine 5 mg qd, hctz 12.5 mg qd ? ?Cholesterol Goals: LDL < 70 ?  ?Intolerant/previously tried: higher doses of rosuvastatin (now at 5 mg weekly), simvastatin - myalgias ? ?Family history: father died from heart disease/vascular issues; son - cerebral palsy, daughter healthy ? ?Exercise:  none due to difficulty with walking ? ?Labs: 12/26/21:  Na 142, K 4.5, Glu 123, BUN 11, SCr 0.9 ?  TC 200, TG 110, HDL 43, LDL 137 (rosuvastatin 5 mg weekly) ? ? ?Current Outpatient Medications  ?Medication Sig Dispense Refill  ? albuterol (VENTOLIN HFA) 108 (90 Base) MCG/ACT inhaler TAKE 2 PUFFS BY MOUTH EVERY 6 HOURS AS NEEDED FOR WHEEZE OR SHORTNESS OF BREATH 18  each 2  ? amLODipine (NORVASC) 5 MG tablet TAKE 1 TABLET BY MOUTH EVERY DAY 90 tablet 3  ? aspirin EC 81 MG tablet Take 81 mg by mouth daily. Swallow whole.    ? enalapril (VASOTEC) 10 MG tablet TAKE 2 TABLETS BY MOUTH TWICE A DAY 360 tablet 3  ? ezetimibe (ZETIA) 10 MG tablet Take 1 tablet (10 mg total) by mouth daily. 90 tablet 3  ? fluticasone (FLONASE) 50 MCG/ACT nasal spray Place 1 spray into both nostrils daily. 16 g 6  ? hydrochlorothiazide (MICROZIDE) 12.5 MG capsule Take 1 capsule (12.5 mg total) by mouth daily. 90 capsule 3  ? hydrOXYzine (VISTARIL) 25 MG capsule Take 25 mg by mouth 4 (four) times daily as needed.    ? Polyethyl Glycol-Propyl Glycol 0.4-0.3 % SOLN Place 1 drop into both eyes 3 (three) times daily as needed (dry eyes).     ? TRELEGY ELLIPTA 100-62.5-25 MCG/ACT AEPB INHALE 1 PUFF BY MOUTH EVERY DAY 60 each 6  ? furosemide (LASIX) 20 MG tablet Take 1 tablet (20 mg total) by mouth daily. 90 tablet 1  ? ?No current facility-administered medications for this visit.  ? ? ?Allergies  ?Allergen Reactions  ? Beta Adrenergic Blockers Other (See Comments)  ?  Trigger Asthma  ? Carvedilol Shortness Of Breath  ?  Reacts with albuterol, worsens asthma symptoms when taken together   ? Peanut-Containing Drug Products Other (See  Comments)  ?   Trigger Asthma attack  ? Crestor [Rosuvastatin]   ?  myalgias  ? Statins Other (See Comments)  ? Zocor [Simvastatin]   ?  myalgias  ? ? ?Past Medical History:  ?Diagnosis Date  ? Acid reflux   ? Anxiety   ? Asthma   ? Atherosclerosis   ? Back pain   ? Breast cancer ([place]) 05/2020  ? left breast DCIS  ? Chest pain   ? Chronic kidney disease   ? Right side question infarction  ? Constipation   ? COPD (chronic obstructive pulmonary disease) ([place])   ? Diabetes mellitus without complication ([place])   ? no meds  ? Edema of both lower extremities   ? GERD (gastroesophageal reflux disease)   ? Hyperlipidemia   ? Hypertension   ? Joint pain   ? Kidney problem   ? Obesity   ? PAD  (peripheral artery disease) ([place])   ? Peripheral vascular disease ([place])   ? severe athrosclerosis  ? Sleep apnea   ?  no cpap at this time. Sleep study done Jun 20 2020  ? SOB (shortness of breath)   ? Swallowing difficulty   ? ? ?Blood pressure (!) 176/80, pulse 85, resp. rate 15, height '5\' 10"'$  (1.778 m), weight (!) 330 lb (149.7 kg), last menstrual period 12/09/2013, SpO2 96 %. ? ? ?Essential hypertension ?Blood pressure elevated in the office today, consistent with past readings in Epic.  Patient is very hesitant to make multiple medication changes at one time and notes she is very sensitive to medications.   Will get her prescription for furosemide 20 mg, but explained that this will have only a small impact on her BP.  She is unwilling to start multiple medications, so we will do this for now and follow up in a month to hopefully work on getting better BP control.   ? ?Dyslipidemia ?Patient with Leriche syndrome and PAD, with LDL elevated at 137.  Is tolerant only of rosuvastatin 5 mg weekly.  Reviewed options for lowering LDL cholesterol, including ezetimibe, PCSK-9 inhibitors, bempedoic acid and inclisiran.  Discussed mechanisms of action, dosing, side effects and potential decreases in LDL cholesterol.  Also reviewed cost information and potential options for patient assistance.  Answered all patient questions.  Patient still reluctant to start multiple medications, but will agree to try ezetimibe 10 mg daily.   ? ?Patient spoke with Westley Hummer LCSW while in office and was given information on SHIIP to help navigate insurance changes coming up.   ? ? ?Tommy Medal PharmD CPP [facility] ?[address] ?[place], [place] [address] ?[phone] ? ? ? ?

## 2022-01-01 NOTE — Progress Notes (Signed)
?Heart and Vascular Care Navigation ? ?01/01/2022 ? ?[name] ?1955-11-14 ?[id number] ? ?Reason for Referral:  ?Engaged with patient face to face for initial visit for Heart and Vascular Care Coordination. ?                                                                                                  ?Assessment:                   ?LCSW met with pt at request of pharmacy team. Introduced self, role, reason for visit. Confirmed home address and emergency contacts. Pt currently lives with husband and son, son has disability she helps with his care, her spouse works. Pt shares that she currently has [employer], she thinks she applied for Medicaid in the past but was over the income limit w/ her limited income plus her spouses income. LCSW provided pt with [facility] team contact information and their handouts regarding Medicare enrollment as pt worried about making sure she gets what she needs with Medicare coverage next month. LCSW also provided my card for any additional questions/concerns/in case she does not get in touch with [facility] counselors. I also encouraged pt to f/u with DSS again regarding Medicaid as with a household of 3 and limited income I would be curious to see if she may be eligible for Medicaid coverage as well.  No additional questions/concerns for this writer at this time.  ? ?HRT/VAS Care Coordination   ? ? Patients Home Cardiology Office [facility]  ? Outpatient Care Team Social Worker  ? Social Worker Name: Westley Hummer, LCSW, [facility]  ? Living arrangements for the past 2 months Single Family Home  ? Lives with: Spouse; Adult Children  ? Patient Current Librarian, academic  ? Patient Has Concern With Paying Medical Bills No  ? Does Patient Have Prescription Coverage? Yes  ? Home Assistive Devices/Equipment Walker (specify type); Blood pressure cuff; Eyeglasses  ? DME Agency NA  ? [place] Agency NA  ? ?  ? ? ?Social History:                                                                              ?SDOH Screenings  ? ?Alcohol Screen: Not on file  ?Depression (PHQ2-9): Low Risk   ? PHQ-2 Score: 0  ?Financial Resource Strain: Medium Risk  ? Difficulty of Paying Living Expenses: Somewhat hard  ?Food Insecurity: Not on file  ?Housing: Not on file  ?Physical Activity: Not on file  ?Social Connections: Not on file  ?Stress: Not on file  ?Tobacco Use: Medium Risk  ? Smoking Tobacco Use: Former  ? Smokeless Tobacco Use: Never  ? Passive Exposure: Not on file  ?Transportation Needs: Not on file  ? ? ?SDOH Interventions: ?Financial Resources:  Financial Strain Interventions: Other (Comment) (SHIIP) ?  ? ? ?Other Care Navigation Interventions:    ? ?Provided Pharmacy assistance resources  SHIIP/ LIS/Extra Help  ? ?Follow-up plan:   ?LCSW provided pt with my card, encouraged her to f/u as needed with any questions/concerns.  ? ?Westley Hummer, MSW, LCSW ?Clinical Social Worker II ?[facility] Heart/Vascular Care Navigation  ?[phone]- work cell phone (preferred) ?[phone]- desk phone ? ? ? ? ?

## 2022-01-01 NOTE — Patient Instructions (Addendum)
Your Results: ?           ? Your most recent labs Goal  ?Total Cholesterol 200 < 200  ?Triglycerides 110 < 150  ?HDL (happy/good cholesterol) 43 > 40  ?LDL (lousy/bad cholesterol 137 < 70  ? ?Medication changes: ? Start ezetimibe 10 mg once daily.   ? Start furosemide 20 mg once daily - in the mornings.   ? ?Return for follow up on Thursday June 8 at 3:30 pm ? ?Check your blood pressure at home 3-4 times per week as you are able. ? ? ?Thank you for choosing [facility]  ? ? ? ?

## 2022-01-01 NOTE — Telephone Encounter (Signed)
Left pt voicemail regarding setting up an appointment the week of may 15th, 2023 for her CT scan. This LPN left the Centralized scheduling # [phone] and to press option 3 to make the appointment and to give [name] a call at the office when she has made one so we are aware.  ?

## 2022-01-02 ENCOUNTER — Telehealth: Payer: Self-pay | Admitting: Pharmacist Clinician (PhC)/ Clinical Pharmacy Specialist

## 2022-01-02 ENCOUNTER — Telehealth: Payer: Self-pay | Admitting: Hematology

## 2022-01-02 ENCOUNTER — Encounter: Payer: Self-pay | Admitting: Pharmacist Clinician (PhC)/ Clinical Pharmacy Specialist

## 2022-01-02 NOTE — Assessment & Plan Note (Signed)
Patient with Leriche syndrome and PAD, with LDL elevated at 137.  Is tolerant only of rosuvastatin 5 mg weekly.  Reviewed options for lowering LDL cholesterol, including ezetimibe, PCSK-9 inhibitors, bempedoic acid and inclisiran.  Discussed mechanisms of action, dosing, side effects and potential decreases in LDL cholesterol.  Also reviewed cost information and potential options for patient assistance.  Answered all patient questions.  Patient still reluctant to start multiple medications, but will agree to try ezetimibe 10 mg daily.   ? ?

## 2022-01-02 NOTE — Telephone Encounter (Signed)
.  Called pt per 5.5 inbakset , Patient was unavailable, a message with appt time and date was left with number on file.   ?

## 2022-01-02 NOTE — Telephone Encounter (Signed)
?*  STAT* If patient is at the pharmacy, call can be transferred to refill team. ? ? ?1. Which medications need to be refilled? (please list name of each medication and dose if known)  ?furosemide (LASIX) 20 MG tablet ? ?2. Which pharmacy/location (including street and city if local pharmacy) is medication to be sent to? ?[id number] [place] [facility] [address] ? ?3. Do they need a 30 day or 90 day supply? 90 with refills ? ?Patient said pharmacy never got the rx for this medication  ?

## 2022-01-02 NOTE — Assessment & Plan Note (Signed)
Blood pressure elevated in the office today, consistent with past readings in Epic.  Patient is very hesitant to make multiple medication changes at one time and notes she is very sensitive to medications.   Will get her prescription for furosemide 20 mg, but explained that this will have only a small impact on her BP.  She is unwilling to start multiple medications, so we will do this for now and follow up in a month to hopefully work on getting better BP control.   ?

## 2022-01-03 ENCOUNTER — Encounter: Payer: Self-pay | Admitting: Hematology

## 2022-01-05 ENCOUNTER — Other Ambulatory Visit: Payer: Self-pay

## 2022-01-05 DIAGNOSIS — M7989 Other specified soft tissue disorders: Secondary | ICD-10-CM

## 2022-01-05 MED ORDER — FUROSEMIDE 20 MG PO TABS
20.0000 mg | ORAL_TABLET | Freq: Every day | ORAL | 3 refills | Status: DC
Start: 2022-01-05 — End: 2022-08-03

## 2022-01-05 NOTE — Telephone Encounter (Signed)
Medication refilled and sent to desired pharmacy. LVM for patient to call for any questions.  ?

## 2022-01-06 ENCOUNTER — Telehealth: Payer: Self-pay | Admitting: *Deleted

## 2022-01-06 ENCOUNTER — Telehealth: Payer: Self-pay | Admitting: Hematology

## 2022-01-06 NOTE — Telephone Encounter (Signed)
Attempted to reach the patient to schedule a follow up appt with Dr Delsa Sale in June.  LMOM to call the office back  ?

## 2022-01-06 NOTE — Telephone Encounter (Signed)
Left message with follow-up appointment per 5/4 los. ?

## 2022-01-12 ENCOUNTER — Other Ambulatory Visit: Payer: Self-pay | Admitting: Pulmonary Disease

## 2022-01-13 ENCOUNTER — Ambulatory Visit (HOSPITAL_COMMUNITY)
Admission: RE | Admit: 2022-01-13 | Discharge: 2022-01-13 | Disposition: A | Discharge status: Home / Self Care | Payer: 59 | Source: Ambulatory Visit | Attending: Hematology | Admitting: Hematology

## 2022-01-13 ENCOUNTER — Encounter (HOSPITAL_COMMUNITY): Payer: Self-pay

## 2022-01-13 DIAGNOSIS — R590 Localized enlarged lymph nodes: Secondary | ICD-10-CM | POA: Insufficient documentation

## 2022-01-13 MED ORDER — SODIUM CHLORIDE (PF) 0.9 % IJ SOLN
INTRAMUSCULAR | Status: AC
  Filled 2022-01-13: qty 50

## 2022-01-13 MED ORDER — IOHEXOL 300 MG/ML  SOLN
75.0000 mL | Freq: Once | INTRAMUSCULAR | Status: AC | PRN
  Administered 2022-01-13: 75 mL via INTRAVENOUS

## 2022-01-15 ENCOUNTER — Encounter: Payer: Self-pay | Admitting: Hematology

## 2022-01-15 ENCOUNTER — Inpatient Hospital Stay (HOSPITAL_BASED_OUTPATIENT_CLINIC_OR_DEPARTMENT_OTHER): Payer: 59 | Admitting: Hematology

## 2022-01-15 DIAGNOSIS — D0512 Intraductal carcinoma in situ of left breast: Secondary | ICD-10-CM | POA: Diagnosis not present

## 2022-01-15 NOTE — Progress Notes (Signed)
[place]   Telephone:([phone]) [phone] Fax:(336) [phone]   Clinic Follow up Note   Patient Care Team: [name], MD as PCP - General (Family Medicine) Mauro Kaufmann, RN as Oncology Nurse Navigator Rockwell [place], RN as Oncology Nurse Navigator [name], MD as Consulting Physician (General Surgery) Truitt Merle, MD as Consulting Physician (Hematology) Eppie Gibson, MD as Attending Physician (Radiation Oncology) [name], MD as Consulting Physician (Gynecologic Oncology) Martinique, Peter M, MD as Consulting Physician (Cardiology) Georgia Lopes, DO as Consulting Physician (Bariatrics) Martyn Ehrich, NP as Nurse Practitioner (Pulmonary Disease)  Date of Service:  01/15/2022  I connected with Kelli Horn on 01/15/2022 at  2:40 PM EDT by telephone visit and verified that I am speaking with the correct person using two identifiers.  I discussed the limitations, risks, security and privacy concerns of performing an evaluation and management service by telephone and the availability of in person appointments. I also discussed with the patient that there may be a patient responsible charge related to this service. The patient expressed understanding and agreed to proceed.   Other persons participating in the visit and their role in the encounter:  none  Patient's location:  home Provider's location:  my office  CHIEF COMPLAINT: review recent CT; f/u of DCIS  CURRENT THERAPY:  Surveillance  ASSESSMENT & PLAN:  Kelli Horn is a 66 y.o. female with   1. New enlarged neck lymph nodes -incidental finding on carotid doppler on 05/08/2021 -she notes she has been dealing with dental issues for over the last year.  -physical exam showed no significant palpable cervical adenopathy.   -neck CT on 01/13/22 was negative for mass or adenopathy. Of note, there was evidence of dental infection.  I reviewed the images and discussed the finding with  patient. -no need further f/u    2. Left Breast DCIS, ER+/PR+ -s/p b/l lumpectomies with Dr Marlou Starks on 06/07/20 for benign breast lesions. Surgical path showed: left breast-- 1.3cm solid papillary carcinoma without invasive carcinoma, with clear margins; right breast-- benign. -she declined Adjuvant Radiation and prophylactic antiestrogens. -most recent mammogram 05/13/21 was benign. -she is clinically doing well from a breast cancer standpoint. -Will f/u with her in 1 year.   3. Endometrial Cancer, T1a, Grade I, MSI-stable  -presented with spotting, s/p BSO and total hysterectomy by Dr [name] on 07/11/20. Surgical path showed Endometrioid carcinoma, FIGO grade 1, partially involving endometrial polyp. -She did not require adjuvant treatment.  -she was previously followed by Dr. [name]. She is scheduled for f/u with NP Melissa on 02/03/22.   4. Comorbidites: HTN, DM, DDD/Arthritis, HLD, CAD s/p cardiac cath in 03/2020 -Managed on Amlodipine, baby aspirin, enalapril, Trelegy, Hydroxyzine, Nitroglycerin -Her arthritis is managed on ibuprofen and Robaxin.  -s/p abdominal aortogram with LE for stent placement on 08/08/20 due to arthrosclerosis.      PLAN:  -f/u of endometrial cancer with NP Melissa on 02/03/22 -Mammogram due 05/2022 -F/u in 1 year.    No problem-specific Assessment & Plan notes found for this encounter.   SUMMARY OF ONCOLOGIC HISTORY: Oncology History Overview Note  Cancer Staging Ductal carcinoma in situ (DCIS) of left breast Staging form: Breast, AJCC 8th Edition - Clinical stage from 05/22/2020: Stage 0 (cTis (DCIS), cN0, cM0, ER+, PR+) - Unsigned    Ductal carcinoma in situ (DCIS) of left breast  04/26/2020 Mammogram   IMPRESSION: 1. There is a possible subtle distortion in the lateral right breast without  a definite sonographic correlate.   2.  No evidence of right axillary lymphadenopathy.   3. There are 2 adjacent abutting masses in the left breast at 3:30,  7cmfn which are indeterminate. One of these is an irregular mass measuring 9 x 3 x 5 mm. An adjacent abutting mass measures 4 x 3 x 4 mm.   4.  No evidence of left axillary lymphadenopathy.     05/10/2020 Initial Biopsy   Diagnosis 1. Breast, left, needle core biopsy, 3:30 o'clock - CARCINOMA INVOLVING PAPILLARY LESION; SEE COMMENT. 2. Breast, right, needle core biopsy, lower, outer quadrant - COMPLEX SCLEROSING LESION WITH CALCIFICATIONS. Microscopic Comment 1. The differential diagnosis includes invasive papillary carcinoma and solid papillary carcinoma. Immunohistochemistry for E-cadherin is positive. CK5/6 is negative. SMM-1 is positive, Calponin is equivocal and P63 is negative.   1. PROGNOSTIC INDICATORS Results: IMMUNOHISTOCHEMICAL AND MORPHOMETRIC ANALYSIS PERFORMED MANUALLY Estrogen Receptor: 100%, POSITIVE, STRONG STAINING INTENSITY Progesterone Receptor: 100%, POSITIVE, STRONG STAINING INTENSITY   05/17/2020 Initial Diagnosis   Ductal carcinoma in situ (DCIS) of left breast   06/07/2020 Surgery   BILATERAL BREAST LUMPECTOMY WITH RADIOACTIVE SEED AND LEFT SENTINEL LYMPH NODE BIOPSY by Dr Marlou Starks   06/07/2020 Pathology Results   FINAL MICROSCOPIC DIAGNOSIS:   A. 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  - Margins of resection are not involved (Closest margin: 2 mm, medial).  - Biopsy site.  - See oncology table.   B. BREAST, RIGHT, LUMPECTOMY:  - Fibrocystic change.  - Biopsy site.   C. SENTINEL LYMPH NODE, LEFT #1, BIOPSY:  - One lymph node, negative for carcinoma (0/1).    Endometrial cancer ([place])  06/27/2020 Procedure   Colonoscopy by Dr Michail Sermon  IMPRESSION Preparation of the colon was fair. - One 7 mm polyp in the cecum, removed with a hot snare. Resected and retrieved. - Seven 3 to 7 mm polyps in the sigmoid colon, removed with a hot snare. Resected and retrieved. - One 9 mm polyp in the descending  colon, removed with a hot snare. Resected and retrieved. - Three 1 to 4 mm polyps in the sigmoid colon, removed with a cold biopsy forceps. Resected and retrieved. - Two 3 to 7 mm polyps in the rectum, removed with a hot snare. Resected and retrieved. - Diverticulosis in the sigmoid colon. - Medium-sized lipoma in the ascending colon. - Internal hemorrhoids. - Non-thrombosed external hemorrhoids and hypertrophied anal papilla(e) found on perianal exam.    FINAL MICROSCOPIC DIAGNOSIS:   A. COLON, CECUM/SIGMOID, POLYPECTOMY:  - Tubular adenoma without high-grade dysplasia or malignancy  - Hyperplastic polyp(s)   B. COLON, DESCENDING, POLYPECTOMY:  - Hyperplastic polyp   C. COLON, SIGMOID, POLYPECTOMY:  - Hyperplastic polyp(s)   D. RECTUM, POLYPECTOMY:  - Hyperplastic polyp(s)    07/11/2020 Surgery   XI ROBOTIC ASSISTED TOTAL HYSTERECTOMY WITH BILATERAL SALPINGO OOPHORECTOMY by Dr [name]   07/11/2020 Initial Diagnosis   Endometrial cancer ([place])   07/11/2020 Initial Biopsy   FINAL MICROSCOPIC DIAGNOSIS:   A. UTERUS, CERVIX, BILATERAL FALLOPIAN TUBES AND OVARIES:  Uterus:  -  Endometrioid carcinoma, FIGO grade 1, partially involving endometrial  polyp  -  Leiomyoma (4.0 cm; largest)  -  See oncology table and comment below   Cervix:  -  Benign endocervical polyp  -  No carcinoma identified   Bilateral Ovaries:  -  No carcinoma identified   Bilateral Fallopian tubes:  -  No carcinoma identified    Mismatch Repair  Protein (IHC)   SUMMARY INTERPRETATION: NORMAL    I[facility] EXPRESSION RESULTS   TEST           RESULT  MLH1:          Preserved nuclear expression  MSH2:          Preserved nuclear expression  MSH6:          Preserved nuclear expression  PMS2:          Preserved nuclear expression       INTERVAL HISTORY:  Kelli Horn was contacted to review recent CT results. She was last seen by me on 01/01/22.  She reports she is doing well, no new  concerns. She was appreciative of my call.   All other systems were reviewed with the patient and are negative.  MEDICAL HISTORY:  Past Medical History:  Diagnosis Date   Acid reflux    Anxiety    Asthma    Atherosclerosis    Back pain    Breast cancer ([place]) 05/2020   left breast DCIS   Chest pain    Chronic kidney disease    Right side question infarction   Constipation    COPD (chronic obstructive pulmonary disease) (HCC)    Diabetes mellitus without complication (HCC)    no meds   Edema of both lower extremities    GERD (gastroesophageal reflux disease)    Hyperlipidemia    Hypertension    Joint pain    Kidney problem    Obesity    PAD (peripheral artery disease) ([place])    Peripheral vascular disease ([place])    severe athrosclerosis   Sleep apnea     no cpap at this time. Sleep study done Jun 20 2020   SOB (shortness of breath)    Swallowing difficulty     SURGICAL HISTORY: Past Surgical History:  Procedure Laterality Date   ABDOMINAL AORTOGRAM W/LOWER EXTREMITY Bilateral 08/08/2020   Procedure: ABDOMINAL AORTOGRAM W/LOWER EXTREMITY;  Surgeon: [name], MD;  Location: [facility];  Service: Cardiovascular;  Laterality: Bilateral;   APPENDECTOMY     BIOPSY  06/27/2020   Procedure: BIOPSY;  Surgeon: [name], MD;  [facility];  Service: Endoscopy;;   BREAST LUMPECTOMY WITH RADIOACTIVE SEED AND SENTINEL LYMPH NODE BIOPSY Bilateral 06/07/2020   Procedure: BILATERAL BREAST LUMPECTOMY WITH RADIOACTIVE SEED AND LEFT SENTINEL LYMPH NODE BIOPSY;  Surgeon: [name], MD;  Location: [facility];  Service: General;  Laterality: Bilateral;  PEC BLOCK   COLONOSCOPY WITH PROPOFOL N/A 06/27/2020   Procedure: COLONOSCOPY WITH PROPOFOL;  Surgeon: [name], MD;  [facility];  Service: Endoscopy;  Laterality: N/A;   LEFT HEART CATH AND CORONARY ANGIOGRAPHY N/A 04/23/2020   Procedure: LEFT HEART CATH AND CORONARY  ANGIOGRAPHY;  Surgeon: Martinique, Peter M, MD;  Location: [facility];  Service: Cardiovascular;  Laterality: N/A;   POLYPECTOMY  06/27/2020   Procedure: POLYPECTOMY;  Surgeon: [name], MD;  [facility];  Service: Endoscopy;;   ROBOTIC ASSISTED TOTAL HYSTERECTOMY WITH BILATERAL SALPINGO OOPHERECTOMY N/A 07/11/2020   Procedure: XI ROBOTIC ASSISTED TOTAL HYSTERECTOMY WITH BILATERAL SALPINGO OOPHORECTOMY;  Surgeon: [name], MD;  [facility];  Service: Gynecology;  Laterality: N/A;    I have reviewed the social history and family history with the patient and they are unchanged from previous note.  ALLERGIES:  is allergic to beta adrenergic blockers, carvedilol, peanut-containing drug products, crestor [rosuvastatin], statins, and zocor [simvastatin].  MEDICATIONS:  Current Outpatient Medications  Medication Sig Dispense Refill   albuterol (VENTOLIN HFA) 108 (90 Base) MCG/ACT inhaler TAKE 2 PUFFS BY MOUTH EVERY 6 HOURS AS NEEDED FOR WHEEZE OR SHORTNESS OF BREATH 8.5 each 2   amLODipine (NORVASC) 5 MG tablet TAKE 1 TABLET BY MOUTH EVERY DAY 90 tablet 3   aspirin EC 81 MG tablet Take 81 mg by mouth daily. Swallow whole.     enalapril (VASOTEC) 10 MG tablet TAKE 2 TABLETS BY MOUTH TWICE A DAY 360 tablet 3   ezetimibe (ZETIA) 10 MG tablet Take 1 tablet (10 mg total) by mouth daily. 90 tablet 3   fluticasone (FLONASE) 50 MCG/ACT nasal spray Place 1 spray into both nostrils daily. 16 g 6   furosemide (LASIX) 20 MG tablet Take 1 tablet (20 mg total) by mouth daily. 90 tablet 3   hydrochlorothiazide (MICROZIDE) 12.5 MG capsule Take 1 capsule (12.5 mg total) by mouth daily. 90 capsule 3   hydrOXYzine (VISTARIL) 25 MG capsule Take 25 mg by mouth 4 (four) times daily as needed.     Polyethyl Glycol-Propyl Glycol 0.4-0.3 % SOLN Place 1 drop into both eyes 3 (three) times daily as needed (dry eyes).      TRELEGY ELLIPTA 100-62.5-25 MCG/ACT AEPB INHALE 1 PUFF BY MOUTH EVERY DAY  60 each 6   No current facility-administered medications for this visit.    PHYSICAL EXAMINATION: ECOG PERFORMANCE STATUS: 1 - Symptomatic but completely ambulatory  There were no vitals filed for this visit. Wt Readings from Last 3 Encounters:  01/01/22 (!) 330 lb (149.7 kg)  01/01/22 (!) 329 lb 4.8 oz (149.4 kg)  11/27/21 (!) 330 lb 3.2 oz (149.8 kg)     No vitals taken today, Exam not performed today  LABORATORY DATA:  I have reviewed the data as listed    Latest Ref Rng & Units 08/02/2020    4:35 PM 07/05/2020   12:22 PM 05/22/2020   12:06 PM  CBC  WBC 3.4 - 10.8 x10E3/uL 13.8   9.5   11.5    Hemoglobin 11.1 - 15.9 g/dL 13.0   13.3   13.7    Hematocrit 34.0 - 46.6 % 39.3   41.5   42.1    Platelets 150 - 450 x10E3/uL 466   389   415          Latest Ref Rng & Units 11/05/2020   10:18 AM 08/02/2020    4:35 PM 07/05/2020   12:22 PM  CMP  Glucose 65 - 99 mg/dL 120   101   117    BUN 8 - 27 mg/dL _0 Creatinine 0.57 - 1.00 mg/dL 0.85   0.95   0.80    Sodium 134 - 144 mmol/L 140   141   140    Potassium 3.5 - 5.2 mmol/L 4.8   4.5   4.1    Chloride 96 - 106 mmol/L 99   102   104    CO2 20 - 29 mmol/L _1 Calcium 8.7 - 10.3 mg/dL 9.3   9.4   9.1    Total Protein 6.0 - 8.5 g/dL 7.1    7.3    Total Bilirubin 0.0 - 1.2 mg/dL 0.2    0.4    Alkaline Phos 44 - 121 IU/L 102    78    AST 0 - 40 IU/L 15  16    ALT 0 - 32 IU/L 15    17        RADIOGRAPHIC STUDIES: I have personally reviewed the radiological images as listed and agreed with the findings in the report. No results found.    No orders of the defined types were placed in this encounter.  All questions were answered. The patient knows to call the clinic with any problems, questions or concerns. No barriers to learning was detected. The total time spent in the appointment was 15 minutes.     Truitt Merle, MD 01/15/2022   I, Wilburn Mylar, am acting as scribe for Truitt Merle, MD.   I  have reviewed the above documentation for accuracy and completeness, and I agree with the above.

## 2022-01-19 ENCOUNTER — Other Ambulatory Visit: Payer: Self-pay | Admitting: *Deleted

## 2022-01-19 DIAGNOSIS — I739 Peripheral vascular disease, unspecified: Secondary | ICD-10-CM

## 2022-01-20 ENCOUNTER — Other Ambulatory Visit: Payer: Self-pay | Admitting: Family Medicine

## 2022-01-20 ENCOUNTER — Ambulatory Visit (INDEPENDENT_AMBULATORY_CARE_PROVIDER_SITE_OTHER): Payer: 59 | Admitting: Family Medicine

## 2022-01-20 ENCOUNTER — Encounter: Payer: Self-pay | Admitting: Family Medicine

## 2022-01-20 VITALS — BP 134/70 | HR 60 | Temp 98.3°F | Ht 70.0 in | Wt 326.2 lb

## 2022-01-20 DIAGNOSIS — J4540 Moderate persistent asthma, uncomplicated: Secondary | ICD-10-CM

## 2022-01-20 DIAGNOSIS — R252 Cramp and spasm: Secondary | ICD-10-CM | POA: Diagnosis not present

## 2022-01-20 DIAGNOSIS — I10 Essential (primary) hypertension: Secondary | ICD-10-CM

## 2022-01-20 DIAGNOSIS — Z6841 Body Mass Index (BMI) 40.0 and over, adult: Secondary | ICD-10-CM

## 2022-01-20 DIAGNOSIS — R110 Nausea: Secondary | ICD-10-CM

## 2022-01-20 MED ORDER — LEVALBUTEROL TARTRATE 45 MCG/ACT IN AERO: 2.0000 | INHALATION_SPRAY | RESPIRATORY_TRACT | 12 refills | Status: DC | PRN

## 2022-01-20 MED ORDER — ONDANSETRON HCL 4 MG PO TABS: 4.0000 mg | ORAL_TABLET | Freq: Three times a day (TID) | ORAL | 0 refills | Status: DC | PRN

## 2022-01-20 NOTE — Progress Notes (Signed)
[facility] Kelli Horn [place] [address] Dept: [phone] Dept Fax: [phone]  Chronic Care Office Visit  Subjective:    Patient ID: Kelli Horn, female    DOB: 10-Dec-1955, 66 y.o..   MRN: [id number]  Chief Complaint  Patient presents with   Follow-up    6 month f/u Pt states she did not start lasix or zetia.     History of Present Illness:  Patient is in today for reassessment of chronic medical issues.  Kelli Horn has a history of hypertension, managed on amlodipine and HCTZ. She had stopped the HCTZ for a while, but cardiology had recommended she resume this. He had also prescribed furosemide. Kelli Horn has not started the furosemide yet. She did note some improvement in her peripheral edema with the HCTZ.   Kelli Horn has a history of carotid artery disease. She is followed by Dr. Martinique (cardiology). He has been focused on optimizing medical management of her hypertension and hyperlipidemia.   Kelli Horn has a history of asthma. She is managed on Trelegy and albuterol. She notes that she stays away from the albuterol, as it it pushes her blood pressure much higher.  Kelli Horn has considerable concern about her ongoing struggle with obesity. She notes that she has studied various nutritional approaches. She has tried a number of different diets without significant weight loss. About a week ago, she switched to a plant-based diet. She has experienced some nausea since that time. She is trying to stay hydrated. She had previously been seen at the [facility], but was not successful with the dietary changes they advised. She finds trying to get exercise to be very difficult. She has some claudication that occurs with walking. She feels at times that this is also leg cramps, which can occur at rest as well. Additionally, she gets short of breath. She has not tried using her albuterol for  this, as it pushes up her blood pressure.  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   Periodontal disease 10/02/2020   Aortic occlusion ([place]) 08/20/2020   Endometrial cancer ([place]) 07/11/2020   Rectal bleeding 06/27/2020   Carotid artery disease ([place]) 05/29/2020   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   PAD (peripheral artery disease) ([place]) 04/23/2020   Upper back pain 03/24/2016   Left shoulder pain 03/24/2016   Hypersomnia with sleep apnea 02/13/2016   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   Morbid obesity with body mass index (BMI) of 45.0 to 49.9 in adult ([place]) 02/13/2016   Circadian rhythm sleep disorder, 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   Weakness of both legs 02/13/2016   Angina syndrome, abdominal ([place]) 02/13/2016   History of progressive weakness 01/10/2016   Gait instability 01/10/2016   Essential hypertension 12/11/2015   Asthma 12/11/2015   Normocytic anemia 12/11/2015   Leukocytosis 12/11/2015   Prediabetes 12/11/2015   Unstable angina ([place]) 12/11/2015   Chest pain with moderate risk for cardiac etiology    Dyslipidemia    Past Surgical History:  Procedure Laterality Date   ABDOMINAL AORTOGRAM W/LOWER EXTREMITY Bilateral 08/08/2020   Procedure: ABDOMINAL AORTOGRAM W/LOWER EXTREMITY;  Surgeon: [name], MD;  Location: [facility];  Service: Cardiovascular;  Laterality: Bilateral;   APPENDECTOMY     BIOPSY  06/27/2020  Procedure: BIOPSY;  Surgeon: [name], MD;  [facility];  Service: Endoscopy;;   BREAST LUMPECTOMY WITH RADIOACTIVE SEED AND SENTINEL LYMPH NODE BIOPSY Bilateral 06/07/2020   Procedure: BILATERAL BREAST LUMPECTOMY WITH RADIOACTIVE SEED AND LEFT SENTINEL LYMPH NODE BIOPSY;  Surgeon: [name], MD;  Location: [facility];  Service: General;  Laterality: Bilateral;  PEC BLOCK   COLONOSCOPY WITH PROPOFOL N/A 06/27/2020   Procedure: COLONOSCOPY WITH PROPOFOL;  Surgeon: [name], MD;  [facility];  Service: Endoscopy;  Laterality: N/A;   LEFT HEART CATH AND CORONARY ANGIOGRAPHY N/A 04/23/2020   Procedure: LEFT HEART CATH AND CORONARY ANGIOGRAPHY;  Surgeon: Martinique, Peter M, MD;  Location: [facility];  Service: Cardiovascular;  Laterality: N/A;   POLYPECTOMY  06/27/2020   Procedure: POLYPECTOMY;  Surgeon: [name], MD;  [facility];  Service: Endoscopy;;   ROBOTIC ASSISTED TOTAL HYSTERECTOMY WITH BILATERAL SALPINGO OOPHERECTOMY N/A 07/11/2020   Procedure: XI ROBOTIC ASSISTED TOTAL HYSTERECTOMY WITH BILATERAL SALPINGO OOPHORECTOMY;  Surgeon: [name], MD;  [facility];  Service: Gynecology;  Laterality: N/A;   Family History  Problem Relation Age of Onset   Heart attack Father 56   Aneurysm Father    Diabetes Father    Hypertension Father    High Cholesterol Father    Stroke Father    Obesity Father    AAA (abdominal aortic aneurysm) Father    Breast cancer Maternal Aunt    Lung cancer Mother    Schizophrenia Mother    Diabetes Other    Heart disease Paternal Uncle    Aneurysm Paternal Uncle    Outpatient Medications Prior to Visit  Medication Sig Dispense Refill   amLODipine (NORVASC) 5 MG tablet TAKE 1 TABLET BY MOUTH EVERY DAY 90 tablet 3   aspirin EC 81 MG tablet Take 81 mg by mouth daily. Swallow whole.     enalapril (VASOTEC) 10 MG tablet TAKE 2 TABLETS BY MOUTH TWICE A DAY 360 tablet 3   fluticasone (FLONASE) 50 MCG/ACT nasal spray Place 1 spray into both nostrils daily. 16 g 6   hydrochlorothiazide (MICROZIDE) 12.5 MG capsule Take 1 capsule (12.5 mg total) by mouth daily. 90 capsule 3   hydrOXYzine (VISTARIL) 25 MG capsule Take 25 mg by mouth 4 (four) times daily as needed.     Polyethyl Glycol-Propyl Glycol 0.4-0.3 % SOLN Place 1 drop  into both eyes 3 (three) times daily as needed (dry eyes).      TRELEGY ELLIPTA 100-62.5-25 MCG/ACT AEPB INHALE 1 PUFF BY MOUTH EVERY DAY 60 each 6   albuterol (VENTOLIN HFA) 108 (90 Base) MCG/ACT inhaler TAKE 2 PUFFS BY MOUTH EVERY 6 HOURS AS NEEDED FOR WHEEZE OR SHORTNESS OF BREATH 8.5 each 2   ezetimibe (ZETIA) 10 MG tablet Take 1 tablet (10 mg total) by mouth daily. (Patient not taking: Reported on 01/20/2022) 90 tablet 3   furosemide (LASIX) 20 MG tablet Take 1 tablet (20 mg total) by mouth daily. (Patient not taking: Reported on 01/20/2022) 90 tablet 3   No facility-administered medications prior to visit.    Allergies  Allergen Reactions   Beta Adrenergic Blockers Other (See Comments)    Trigger Asthma   Carvedilol Shortness Of Breath    Reacts with albuterol, worsens asthma symptoms when taken together    Peanut-Containing Drug Products Other (See Comments)     Trigger Asthma attack   Crestor [Rosuvastatin]     myalgias   Iodinated Contrast Media  Other (See Comments)    Pt states that she has never had any reaction what so ever to contrast.    Statins Other (See Comments)   Zocor [Simvastatin]     myalgias      Objective:   Today's Vitals   01/20/22 1301  BP: 134/70  Pulse: 60  Temp: 98.3 F (36.8 C)  TempSrc: Temporal  SpO2: 97%  Weight: (!) 326 lb 3.2 oz (148 kg)  Height: '5\' 10"'$  (1.778 m)   Body mass index is 46.8 kg/m.   General: Well developed, well nourished. No acute distress. Lungs: Clear to auscultation bilaterally. No wheezing, rales or rhonchi. CV: RRR without murmurs or rubs. Pulses 2+ bilaterally. Extremities: No edema noted. Psych: Alert and oriented. Normal mood and affect.  Health Maintenance Due  Topic Date Due   HIV Screening  Never done   Hepatitis C Screening  Never done   TETANUS/TDAP  Never done   Zoster Vaccines- Shingrix (1 of 2) Never done   PAP SMEAR-Modifier  Never done   COVID-19 Vaccine (3 - Pfizer risk series) 07/29/2020      Assessment & Plan:   1. Essential hypertension Blood pressure is improved, but still not optimal. She will continue amlodipine 5 mg daily, enalapril 10 mg daily, and HCTZ 12.5 mg daily. If not at goal at next visit, would consider increasing her diuretic.  2. Leg cramps Ms. Gassen shared some recent lab results with me that she had done through Dr. Lynann Beaver. There was no lab evidence of an issue that would explain her cramps. We discussed her possibly using tonic water as a source of quinine at bedtime to see if it will help. She also is considering doing hot to cold pool/hot tub sessions to see if this would help.  3. Moderate persistent asthma without complication I will try switching Ms. Koehne to [employer] to see if this causes less reaction with her pulse/blood pressure. I would want her to try and premedicate 15 min prior to exercise. - levalbuterol (XOPENEX HFA) 45 MCG/ACT inhaler; Inhale 2 puffs into the lungs every 4 (four) hours as needed for wheezing.  Dispense: 1 each; Refill: 12  4. Morbid obesity with body mass index (BMI) of 45.0 to 49.9 in adult [facility]) We discussed her dietary efforts at length. I encouraged her to look at small sustainable approaches. I did encourage whatever physical activity she can tolerate. Pool exercises could be a good option for her. We will continue to follow.  Return in about 6 months (around 07/23/2022) for Reassessment.   [name], MD

## 2022-01-22 ENCOUNTER — Telehealth: Payer: Self-pay

## 2022-01-22 NOTE — Telephone Encounter (Signed)
Received a rejection notice form pharmacy stating the Levalbuterol tar HFA 45 mcg inhaler not covered by insurance (non-formulary)  consider duplicate therapy: albuterol Sulfate HFA last filled 01/12/22    Please see paper for more info on your desk.  Thanks. Dm/cma

## 2022-01-23 NOTE — Telephone Encounter (Signed)
Spoke to patient to advise that the Levalbuterol Inhaler is no covered [employer].  With Good RX card she can get it for $53.99. she is getting new insurance as of June 1st. She will give information to pharmacy.  Dm/cma

## 2022-01-27 ENCOUNTER — Ambulatory Visit: Payer: 59 | Admitting: Vascular Surgery

## 2022-01-27 ENCOUNTER — Encounter: Payer: Self-pay | Admitting: Vascular Surgery

## 2022-01-27 ENCOUNTER — Ambulatory Visit (HOSPITAL_COMMUNITY)
Admission: RE | Admit: 2022-01-27 | Discharge: 2022-01-27 | Disposition: A | Discharge status: Home / Self Care | Payer: 59 | Source: Ambulatory Visit | Attending: Vascular Surgery | Admitting: Vascular Surgery

## 2022-01-27 VITALS — BP 182/83 | HR 77 | Temp 97.2°F | Resp 16 | Ht 71.0 in | Wt 330.0 lb

## 2022-01-27 DIAGNOSIS — I739 Peripheral vascular disease, unspecified: Secondary | ICD-10-CM | POA: Insufficient documentation

## 2022-01-27 DIAGNOSIS — I700 Atherosclerosis of aorta: Secondary | ICD-10-CM | POA: Diagnosis not present

## 2022-01-27 NOTE — Progress Notes (Signed)
Patient name: Kelli Horn MRN: [id number] DOB: [birth year] Sex: female  REASON FOR CONSULT: 35-monthfollow-up for bilateral lower extremity claudication with aortoiliac occlusive disease  HPI: Kelli Huyettis a 66y.o. female, with history of hypertension, hyperlipidemia, COPD, morbid obesity, peripheral vascular disease that presents for 621-monthollow-up.  She was previously referred by Dr. BeGwenlyn Foundor evaluation of aortobifemoral bypass. Patient describes years of calf and buttock burning as well as burning in her calf when she walks consistent with claudication.  She had been under the care of Dr. 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  She was subsequently referred to vascular surgery. Also, her cardiologist is Dr. [name] previous had heart cath with no significant CAD.  Last echo from 05/13/20 showed EF 55-60% and LVEF normal function.  We have been managing her claudication medically given her morbid obesity and risk factors for surgery.  Today she reports that she still has severe buttock claudication when she goes to the store.  This used to get better when she stops in the store but now she has to sit down.  No rest pain or tissue loss to suggest critical limb ischemia.  Trying to lose weight with plant-based diet.  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 Sleep study done Jun 20 2020   SOB (shortness of breath)    Swallowing difficulty     Past Surgical History:  Procedure Laterality Date   ABDOMINAL AORTOGRAM W/LOWER EXTREMITY Bilateral 08/08/2020   Procedure: ABDOMINAL AORTOGRAM W/LOWER EXTREMITY;  Surgeon: BeLorretta HarpMD;  [facility];  Service: Cardiovascular;  Laterality: Bilateral;   APPENDECTOMY     BIOPSY  06/27/2020   Procedure: BIOPSY;  Surgeon: ScWilford CornerMD;  [facility];  Service: Endoscopy;;   BREAST LUMPECTOMY WITH RADIOACTIVE SEED AND SENTINEL LYMPH NODE BIOPSY Bilateral 06/07/2020   Procedure: BILATERAL BREAST LUMPECTOMY WITH RADIOACTIVE SEED AND LEFT SENTINEL LYMPH NODE BIOPSY;  Surgeon: ToJovita KussmaulMD;  [facility] Service: General;  Laterality: Bilateral;  PEC BLOCK   COLONOSCOPY WITH PROPOFOL N/A 06/27/2020   Procedure: COLONOSCOPY WITH PROPOFOL;  Surgeon: ScWilford CornerMD;  [facility];  Service: Endoscopy;  Laterality: N/A;   LEFT HEART CATH AND CORONARY ANGIOGRAPHY N/A 04/23/2020   Procedure: LEFT HEART CATH AND CORONARY ANGIOGRAPHY;  Surgeon: JoMartiniquePeter M, MD;  [facility];  Service: Cardiovascular;  Laterality: N/A;   POLYPECTOMY  06/27/2020   Procedure: POLYPECTOMY;  Surgeon: ScWilford CornerMD;  [facility];  Service: Endoscopy;;   ROBOTIC ASSISTED TOTAL HYSTERECTOMY WITH BILATERAL SALPINGO OOPHERECTOMY N/A 07/11/2020   Procedure: XI ROBOTIC ASSISTED TOTAL HYSTERECTOMY WITH BILATERAL SALPINGO OOPHORECTOMY;  Surgeon: [name], MD;  [facility];  Service: Gynecology;  Laterality: N/A;    Family History  Problem Relation Age of Onset   Heart attack Father 41   Aneurysm Father    Diabetes Father    Hypertension Father    High Cholesterol Father     Stroke Father    Obesity Father    AAA (abdominal aortic aneurysm) Father    Breast cancer Maternal Aunt    Lung cancer Mother    Schizophrenia Mother    Diabetes Other    Heart disease Paternal Uncle    Aneurysm Paternal Uncle     SOCIAL HISTORY: Social History   Socioeconomic History   Marital status: Married    Spouse name: Not on file   Number of children: 2   Years of education: Not on file   Highest education level: Not on file  Occupational History   Occupation: retired , 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  Tobacco Use   Smoking status: Former    Packs/day: 2.00    Years: 10.00    Pack years: 20.00    Types: Cigarettes    Quit date: 04/16/2004    Years since quitting: 17.7   Smokeless tobacco: Never  Vaping Use   Vaping Use: Never used  Substance and Sexual Activity   Alcohol use: No   Drug use: No   Sexual activity: Not Currently  Other Topics Concern   Not on file  Social History Narrative   Not on file   Social Determinants of Health   Financial Resource Strain: Medium Risk   Difficulty of Paying Living Expenses: Somewhat hard  Food Insecurity: Not on file  Transportation Needs: Not on file  Physical Activity: Not on file  Stress: Not on file  Social Connections: Not on file  Intimate Partner Violence: Not on file    Allergies  Allergen Reactions   Beta Adrenergic Blockers Other (See Comments)    Trigger Asthma   Carvedilol Shortness Of Breath    Reacts with albuterol, worsens asthma symptoms when taken together    Peanut-Containing Drug Products Other (See Comments)     Trigger Asthma attack   Crestor [Rosuvastatin]     myalgias   Iodinated Contrast Media Other (See Comments)    Pt states that she has never had any reaction what so ever to contrast.    Statins Other (See Comments)   Zocor [Simvastatin]     myalgias    Current Outpatient Medications  Medication Sig Dispense Refill   amLODipine (NORVASC) 5 MG tablet TAKE 1 TABLET BY MOUTH  EVERY DAY 90 tablet 3   aspirin EC 81 MG tablet Take 81 mg by mouth daily. Swallow whole.     enalapril (VASOTEC) 10 MG tablet TAKE 2 TABLETS BY MOUTH TWICE A DAY 360 tablet 3   hydrOXYzine (VISTARIL) 25 MG capsule Take 25 mg by mouth 4 (four) times daily as needed.     ondansetron (ZOFRAN) 4 MG tablet Take 1 tablet (4 mg total) by mouth every 8 (eight) hours as needed for nausea or vomiting. 20 tablet 0   Polyethyl Glycol-Propyl Glycol 0.4-0.3 % SOLN Place 1 drop into both eyes 3 (three) times daily as needed (dry eyes).      TRELEGY ELLIPTA 100-62.5-25 MCG/ACT AEPB INHALE  1 PUFF BY MOUTH EVERY DAY 60 each 6   ezetimibe (ZETIA) 10 MG tablet Take 1 tablet (10 mg total) by mouth daily. (Patient not taking: Reported on 01/20/2022) 90 tablet 3   fluticasone (FLONASE) 50 MCG/ACT nasal spray Place 1 spray into both nostrils daily. (Patient not taking: Reported on 01/27/2022) 16 g 6   furosemide (LASIX) 20 MG tablet Take 1 tablet (20 mg total) by mouth daily. (Patient not taking: Reported on 01/20/2022) 90 tablet 3   hydrochlorothiazide (MICROZIDE) 12.5 MG capsule Take 1 capsule (12.5 mg total) by mouth daily. (Patient not taking: Reported on 01/27/2022) 90 capsule 3   levalbuterol (XOPENEX HFA) 45 MCG/ACT inhaler Inhale 2 puffs into the lungs every 4 (four) hours as needed for wheezing. (Patient not taking: Reported on 01/27/2022) 1 each 12   No current facility-administered medications for this visit.    REVIEW OF SYSTEMS:  '[X]'$  denotes positive finding, '[ ]'$  denotes negative finding Cardiac  Comments:  Chest pain or chest pressure:    Shortness of breath upon exertion:    Short of breath when lying flat:    Irregular heart rhythm:        Vascular    Pain in calf, thigh, or hip brought on by ambulation: x   Pain in feet at night that wakes you up from your sleep:     Blood clot in your veins:    Leg swelling:         Pulmonary    Oxygen at home:    Productive cough:     Wheezing:          Neurologic    Sudden weakness in arms or legs:     Sudden numbness in arms or legs:     Sudden onset of difficulty speaking or slurred speech:    Temporary loss of vision in one eye:     Problems with dizziness:         Gastrointestinal    Blood in stool:     Vomited blood:         Genitourinary    Burning when urinating:     Blood in urine:        Psychiatric    Major depression:         Hematologic    Bleeding problems:    Problems with blood clotting too easily:        Skin    Rashes or ulcers:        Constitutional    Fever or chills:      PHYSICAL EXAM: Vitals:   01/27/22 1223  BP: (!) 182/83  Pulse: 77  Resp: 16  Temp: (!) 97.2 F (36.2 C)  TempSrc: Temporal  SpO2: 94%  Weight: (!) 330 lb (149.7 kg)  Height: '5\' 11"'$  (1.803 m)    GENERAL: The patient is a well-nourished female, in no acute distress. The vital signs are documented above. CARDIAC: There is a regular rate and rhythm.  VASCULAR:  No palpable femoral pulses, large pannus No palpable pedal pulses No active tissue loss on lower extremities PULMONARY: No respiratory distress. ABDOMEN: Soft and non-tender.  Obese. MUSCULOSKELETAL: There are no major deformities or cyanosis. NEUROLOGIC: No focal weakness or paresthesias are detected. SKIN: There are no ulcers or rashes noted. PSYCHIATRIC: The patient has a normal affect.  DATA:   ABIs today are 0.58 on the right biphasic and 0.51 on the left monophasic (previously 0.69/0.65)  Previous CTA on my review shows  occluded distal infrarenal aorta with occluded common iliacs bilaterally.    Assessment/Plan:  66 year old female previously referred by Dr. [name] for evaluation of aortobifemoral bypass.  She presents for 61-monthinterval follow-up.  Still having bilateral lower extremity claudication.  I do believe this is lifestyle limiting although she is able to at least take care of her son and get him to his dialysis visits.  I discussed she  would best benefit from aortobifemoral bypass.  I reviewed her old CT scan and her distal aorta and iliacs are very small and I think stenting would be very high risk for failure given the small size of her vessels.  We previously discussed she is exceedingly high risk for aortobifemoral bypass given her BMI of 46.  Ultimately we weighed options today and she feels like she can still tolerate her symptoms.  I will see her in 6 months with ABIs.  Discussed if she develops rest pain or tissue loss this becomes a much more urgent problem suggesting progression to critical limb ischemia.  CMarty Heck MD [facility] GM[facility]villeOffice: [phone]

## 2022-01-28 ENCOUNTER — Encounter: Payer: Self-pay | Admitting: Gynecologic Oncology

## 2022-01-30 ENCOUNTER — Other Ambulatory Visit: Payer: Self-pay | Admitting: *Deleted

## 2022-01-30 DIAGNOSIS — I739 Peripheral vascular disease, unspecified: Secondary | ICD-10-CM

## 2022-02-02 ENCOUNTER — Telehealth: Payer: Self-pay | Admitting: *Deleted

## 2022-02-02 NOTE — Telephone Encounter (Signed)
Patient called and canceled her appt tomorrow with Melissa APP. Patient will call back to reschedule appt

## 2022-02-03 ENCOUNTER — Inpatient Hospital Stay: Payer: 59 | Admitting: Gynecologic Oncology

## 2022-02-03 DIAGNOSIS — C541 Malignant neoplasm of endometrium: Secondary | ICD-10-CM

## 2022-02-05 ENCOUNTER — Ambulatory Visit: Payer: 59

## 2022-02-19 DIAGNOSIS — J019 Acute sinusitis, unspecified: Secondary | ICD-10-CM | POA: Diagnosis not present

## 2022-02-19 DIAGNOSIS — M5002 Cervical disc disorder with myelopathy, mid-cervical region, unspecified level: Secondary | ICD-10-CM | POA: Diagnosis not present

## 2022-03-05 DIAGNOSIS — H2513 Age-related nuclear cataract, bilateral: Secondary | ICD-10-CM | POA: Diagnosis not present

## 2022-03-05 DIAGNOSIS — H02831 Dermatochalasis of right upper eyelid: Secondary | ICD-10-CM | POA: Diagnosis not present

## 2022-03-05 DIAGNOSIS — E119 Type 2 diabetes mellitus without complications: Secondary | ICD-10-CM | POA: Diagnosis not present

## 2022-03-05 DIAGNOSIS — H02834 Dermatochalasis of left upper eyelid: Secondary | ICD-10-CM | POA: Diagnosis not present

## 2022-03-05 LAB — HM DIABETES EYE EXAM

## 2022-03-06 ENCOUNTER — Encounter: Payer: Self-pay | Admitting: Family Medicine

## 2022-03-10 ENCOUNTER — Ambulatory Visit: Payer: Medicare Other | Admitting: Pulmonary Disease

## 2022-03-10 ENCOUNTER — Encounter: Payer: Self-pay | Admitting: Pulmonary Disease

## 2022-03-10 VITALS — BP 130/88 | HR 83 | Ht 70.0 in | Wt 320.2 lb

## 2022-03-10 DIAGNOSIS — J4540 Moderate persistent asthma, uncomplicated: Secondary | ICD-10-CM | POA: Diagnosis not present

## 2022-03-10 DIAGNOSIS — G4730 Sleep apnea, unspecified: Secondary | ICD-10-CM

## 2022-03-10 DIAGNOSIS — G4710 Hypersomnia, unspecified: Secondary | ICD-10-CM

## 2022-03-10 MED ORDER — LORATADINE 10 MG PO TABS: 10.0000 mg | ORAL_TABLET | Freq: Every day | ORAL | 5 refills | Status: DC

## 2022-03-10 NOTE — Assessment & Plan Note (Signed)
Weight loss encouraged. She does not want reevaluation, does not want to consider CPAP

## 2022-03-10 NOTE — Patient Instructions (Addendum)
  X spirometry  X Refill on claritin  Continue on Trelegy

## 2022-03-10 NOTE — Assessment & Plan Note (Addendum)
Feel like she may have asthma/COPD overlap -mainly due to longstanding asthma rather than smoking.  Lung function has improved 10% from 2021 but remains low at 54% with obstructive pattern on spirometry. We discussed stepdown from triple therapy but decided to continue for now to give her a longer stable period. Prescription for loratadine will be refilled.  I have asked her to take this seasonally during spring and fall

## 2022-03-10 NOTE — Progress Notes (Signed)
   Subjective:    Patient ID: Kelli Horn, female    DOB: 1956/01/22, 66 y.o.   MRN: [id number]  HPI  66 yo obese ex-smoker for follow-up of asthma/COPD, OSA & delayed sleep phase syndrome  PFTs - severe obstructive defect with mild diffusion defect.   Smoked About 20 pack years before she quit in 2015 -She did not tolerate CPAP mask due to claustrophobia and this was discontinued after 25-monthtrial -told by neurologist that she "could have narcolepsy".  She never underwent MSLT f >> deferred MSLT due to delayed sleep phase  -She reports asthma as a child but grew out of this as a young adult and then symptoms resurfaced about 10 years ago.       PMH -endometrial cancer s/p BSO and total hysterectomy , left sided breast cancer, stage I, ER/PR positive, severe peripheral vascular disease, coronary artery disease, possible enteric ischemia,   diabetes mellitus  738-monthollow-up visit. Trelegy really worked well for her, she has not had interim flareups Her PCP put her on loratadine and this has really also reduced use of rescue albuterol.  She wonders whether she has COPD or asthma.  She states that she had severe asthma as a child Weight has decreased about 8 pounds.   Significant tests/ events reviewed 05/2020 Eos 300  02/2022 spirometry shows a ratio of 70, FEV1 54%, FVC 59%   03/2020 PFT: FVC 61%, FEV1 44%, ratio 56, 8% change in FEV1 post. No TLC. DLC 70%/17.23   05/2020 HST -AHI 7.7/hour, nadir saturation 84%, 11 minutes with saturation less than 88%   Review of Systems neg for any significant sore throat, dysphagia, itching, sneezing, nasal congestion or excess/ purulent secretions, fever, chills, sweats, unintended wt loss, pleuritic or exertional cp, hempoptysis, orthopnea pnd or change in chronic leg swelling. Also denies presyncope, palpitations, heartburn, abdominal pain, nausea, vomiting, diarrhea or change in bowel or urinary habits, dysuria,hematuria, rash,  arthralgias, visual complaints, headache, numbness weakness or ataxia.     Objective:   Physical Exam  Gen. Pleasant, obese, in no distress ENT - no lesions, no post nasal drip Neck: No JVD, no thyromegaly, no carotid bruits Lungs: no use of accessory muscles, no dullness to percussion, decreased without rales or rhonchi  Cardiovascular: Rhythm regular, heart sounds  normal, no murmurs or gallops, no peripheral edema Musculoskeletal: No deformities, no cyanosis or clubbing , no tremors       Assessment & Plan:

## 2022-03-23 ENCOUNTER — Other Ambulatory Visit: Payer: Self-pay | Admitting: Pulmonary Disease

## 2022-03-25 ENCOUNTER — Other Ambulatory Visit: Payer: Self-pay | Admitting: Cardiology

## 2022-04-08 ENCOUNTER — Encounter (INDEPENDENT_AMBULATORY_CARE_PROVIDER_SITE_OTHER): Payer: Self-pay

## 2022-05-11 ENCOUNTER — Telehealth: Payer: Self-pay | Admitting: Family Medicine

## 2022-05-11 ENCOUNTER — Ambulatory Visit: Payer: 59 | Admitting: Cardiology

## 2022-05-11 DIAGNOSIS — I10 Essential (primary) hypertension: Secondary | ICD-10-CM

## 2022-05-11 NOTE — Telephone Encounter (Signed)
Caller Name: Emary Zalar Call back phone #: [phone]  Reason for Call: Pt transferred her meds to a new pharmacy. During the transfer they did not include the refills for her bp '10mg'$  medication. Pharmacy stated that she would need to get her PCP to send in another prescription.   [facility] - [place], [place] Phone:  [phone]  Fax:  [phone]

## 2022-05-12 MED ORDER — ENALAPRIL MALEATE 10 MG PO TABS: 20.0000 mg | ORAL_TABLET | Freq: Two times a day (BID) | ORAL | 0 refills | Status: DC

## 2022-05-12 NOTE — Telephone Encounter (Signed)
Refill sent to the pharmacy and patient notified [facility] phone.  Dm/cma

## 2022-05-13 ENCOUNTER — Telehealth: Payer: Self-pay

## 2022-05-13 NOTE — Telephone Encounter (Signed)
PA for Enalapril  submitted through cover my meds.  Awaiting response.  Dm/cma     [id number]

## 2022-05-14 ENCOUNTER — Other Ambulatory Visit: Payer: Self-pay

## 2022-05-14 DIAGNOSIS — I10 Essential (primary) hypertension: Secondary | ICD-10-CM

## 2022-05-14 MED ORDER — ENALAPRIL MALEATE 20 MG PO TABS: 20.0000 mg | ORAL_TABLET | Freq: Two times a day (BID) | ORAL | 1 refills | Status: DC

## 2022-05-14 NOTE — Telephone Encounter (Signed)
Patient's insurance will only pay for 2 tabs daily. Sent new Rx for Enalapril 20 mg to take twice daily in place ot the 10 mg tab.  Dm/cma

## 2022-05-19 ENCOUNTER — Ambulatory Visit: Payer: 59 | Admitting: Cardiology

## 2022-07-21 ENCOUNTER — Ambulatory Visit: Payer: 59 | Admitting: Family Medicine

## 2022-08-03 ENCOUNTER — Encounter: Payer: Self-pay | Admitting: Family Medicine

## 2022-08-03 ENCOUNTER — Ambulatory Visit (INDEPENDENT_AMBULATORY_CARE_PROVIDER_SITE_OTHER): Payer: Medicare Other | Admitting: Family Medicine

## 2022-08-03 VITALS — BP 124/82 | HR 80 | Temp 98.3°F | Ht 70.0 in | Wt 331.0 lb

## 2022-08-03 DIAGNOSIS — L301 Dyshidrosis [pompholyx]: Secondary | ICD-10-CM | POA: Diagnosis not present

## 2022-08-03 DIAGNOSIS — E119 Type 2 diabetes mellitus without complications: Secondary | ICD-10-CM | POA: Diagnosis not present

## 2022-08-03 DIAGNOSIS — Z780 Asymptomatic menopausal state: Secondary | ICD-10-CM

## 2022-08-03 DIAGNOSIS — I10 Essential (primary) hypertension: Secondary | ICD-10-CM

## 2022-08-03 DIAGNOSIS — J4530 Mild persistent asthma, uncomplicated: Secondary | ICD-10-CM

## 2022-08-03 DIAGNOSIS — Z86000 Personal history of in-situ neoplasm of breast: Secondary | ICD-10-CM

## 2022-08-03 DIAGNOSIS — Z13820 Encounter for screening for osteoporosis: Secondary | ICD-10-CM

## 2022-08-03 DIAGNOSIS — R110 Nausea: Secondary | ICD-10-CM

## 2022-08-03 DIAGNOSIS — R7303 Prediabetes: Secondary | ICD-10-CM | POA: Diagnosis not present

## 2022-08-03 LAB — HEMOGLOBIN A1C: Hgb A1c MFr Bld: 7.2 % — ABNORMAL HIGH (ref 4.6–6.5)

## 2022-08-03 LAB — GLUCOSE, RANDOM: Glucose, Bld: 120 mg/dL — ABNORMAL HIGH (ref 70–99)

## 2022-08-03 MED ORDER — ONDANSETRON HCL 4 MG PO TABS: 4.0000 mg | ORAL_TABLET | Freq: Three times a day (TID) | ORAL | 0 refills | Status: DC | PRN

## 2022-08-03 MED ORDER — METFORMIN HCL 500 MG PO TABS: 500.0000 mg | ORAL_TABLET | Freq: Every day | ORAL | 3 refills | Status: DC

## 2022-08-03 MED ORDER — HYDROXYZINE PAMOATE 25 MG PO CAPS: 25.0000 mg | ORAL_CAPSULE | Freq: Four times a day (QID) | ORAL | 5 refills | Status: AC | PRN

## 2022-08-03 MED ORDER — ENALAPRIL MALEATE 20 MG PO TABS: 20.0000 mg | ORAL_TABLET | Freq: Two times a day (BID) | ORAL | 1 refills | Status: DC

## 2022-08-03 MED ORDER — ALBUTEROL SULFATE HFA 108 (90 BASE) MCG/ACT IN AERS: 2.0000 | INHALATION_SPRAY | RESPIRATORY_TRACT | 3 refills | Status: DC | PRN

## 2022-08-03 NOTE — Addendum Note (Signed)
Addended by: [name] on: 08/03/2022 05:47 PM   Modules accepted: Orders

## 2022-08-03 NOTE — Progress Notes (Signed)
[facility] Kelli Horn [place] [address] Dept: [phone] Dept Fax: [phone]  Chronic Care Office Visit  Subjective:    Patient ID: Kelli Horn, female    DOB: 1956/03/16, 66 y.o..   MRN: [id number]  Chief Complaint  Patient presents with   Follow-up    6 month f/u.  No concerns.     History of Present Illness:  Patient is in today for reassessment of chronic medical issues.  [name] has a history of hypertension, managed on amlodipine 5 mg daily, enalapril 20 mg bid and HCTZ 12.5 mg daily.    [name] has a history of carotid artery disease. She is followed by Dr. Martinique (cardiology). He has been focused on optimizing medical management of her hypertension and hyperlipidemia.    [name] has a history of asthma. She is managed on Trelegy and albuterol. She has not needed to use the albuterol in quite some time.   [name] notes she gets episodic itching of her palms. She notes any occasional rash that develops. She worries about her circulation being a problem.  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/2020   Periodontal disease 10/02/2020   Aortic occlusion ([place]) 08/20/2020   Endometrial cancer ([place]) 07/11/2020   Rectal bleeding 06/27/2020   Carotid artery disease ([place]) 05/29/2020   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   PAD (peripheral artery disease) ([place]) 04/23/2020   Upper back pain 03/24/2016   Left shoulder pain 03/24/2016   Hypersomnia with sleep apnea 02/13/2016   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   Morbid obesity with body mass index (BMI) of 45.0 to 49.9 in adult ([place]) 02/13/2016   Circadian rhythm sleep disorder, delayed sleep  phase type 02/13/2016   Weakness of both legs 02/13/2016   Angina syndrome, abdominal ([place]) 02/13/2016   History of progressive weakness 01/10/2016   Gait instability 01/10/2016   Essential hypertension 12/11/2015   Mild persistent asthma 12/11/2015   Normocytic anemia 12/11/2015   Leukocytosis 12/11/2015   Prediabetes 12/11/2015   Unstable angina ([place]) 12/11/2015   Chest pain with moderate risk for cardiac etiology    Dyslipidemia    Past Surgical History:  Procedure Laterality Date   ABDOMINAL AORTOGRAM W/LOWER EXTREMITY Bilateral 08/08/2020   Procedure: ABDOMINAL AORTOGRAM W/LOWER EXTREMITY;  Surgeon: [name], MD;  Location: [facility];  Service: Cardiovascular;  Laterality: Bilateral;   APPENDECTOMY     BIOPSY  06/27/2020   Procedure: BIOPSY;  Surgeon: [name], MD;  [facility];  Service: Endoscopy;;   BREAST LUMPECTOMY WITH RADIOACTIVE SEED AND SENTINEL LYMPH NODE BIOPSY Bilateral 06/07/2020   Procedure: BILATERAL BREAST LUMPECTOMY WITH RADIOACTIVE SEED AND LEFT SENTINEL LYMPH NODE BIOPSY;  Surgeon: [name], MD;  Location: [facility];  Service: General;  Laterality: Bilateral;  PEC BLOCK   COLONOSCOPY WITH PROPOFOL N/A 06/27/2020   Procedure: COLONOSCOPY WITH PROPOFOL;  Surgeon: [name], MD;  [facility];  Service: Endoscopy;  Laterality: N/A;   LEFT HEART CATH AND CORONARY ANGIOGRAPHY N/A 04/23/2020   Procedure: LEFT HEART CATH AND CORONARY ANGIOGRAPHY;  Surgeon: Martinique, Peter M, MD;  Location: [facility];  Service: Cardiovascular;  Laterality: N/A;   POLYPECTOMY  06/27/2020   Procedure: POLYPECTOMY;  Surgeon: [name], MD;  [facility];  Service: Endoscopy;;   ROBOTIC ASSISTED TOTAL HYSTERECTOMY WITH BILATERAL SALPINGO OOPHERECTOMY N/A 07/11/2020   Procedure: XI ROBOTIC ASSISTED TOTAL HYSTERECTOMY WITH BILATERAL SALPINGO OOPHORECTOMY;  Surgeon: [name], MD;  [facility];  Service: Gynecology;  Laterality: N/A;   Family History  Problem Relation Age of Onset   Lung cancer Mother    Schizophrenia Mother    Heart attack Father 66   Aneurysm Father    Diabetes Father    Hypertension Father    High Cholesterol Father    Stroke Father    Obesity Father    AAA (abdominal aortic aneurysm) Father    Breast cancer Maternal Aunt    Heart disease Paternal Uncle    Aneurysm Paternal Uncle    Diabetes Other    Colon cancer Neg Hx    Cancer - Ovarian Neg Hx    Endometrial cancer Neg Hx    Pancreatic cancer Neg Hx    Prostate cancer Neg Hx    Outpatient Medications Prior to Visit  Medication Sig Dispense Refill   albuterol (VENTOLIN HFA) 108 (90 Base) MCG/ACT inhaler Inhale 2 puffs into the lungs.     amLODipine (NORVASC) 5 MG tablet TAKE 1 TABLET BY MOUTH EVERY DAY 90 tablet 3   aspirin EC 81 MG tablet Take 81 mg by mouth daily. Swallow whole.     enalapril (VASOTEC) 20 MG tablet Take 1 tablet (20 mg total) by mouth 2 (two) times daily. 180 tablet 1   Fluticasone-Umeclidin-Vilant (TRELEGY ELLIPTA) 100-62.5-25 MCG/ACT AEPB INHALE 1 PUFF BY MOUTH EVERY DAY 60 each 11   hydrochlorothiazide (MICROZIDE) 12.5 MG capsule Take 1 capsule (12.5 mg total) by mouth daily. 90 capsule 3   hydrOXYzine (VISTARIL) 25 MG capsule Take 25 mg by mouth 4 (four) times daily as needed.     loratadine (CLARITIN) 10 MG tablet Take 1 tablet (10 mg total) by mouth daily. 30 tablet 5   ondansetron (ZOFRAN) 4 MG tablet Take 1 tablet (4 mg total) by mouth every 8 (eight) hours as needed for nausea or vomiting. 20 tablet 0   Polyethyl Glycol-Propyl Glycol 0.4-0.3 % SOLN Place 1 drop into both eyes 3 (three) times daily as needed (dry eyes).      fluticasone (FLONASE) 50 MCG/ACT nasal spray Place 1 spray into both nostrils daily. (Patient not taking: Reported on 03/10/2022) 16 g 6   ezetimibe (ZETIA) 10 MG tablet Take 1 tablet (10 mg total) by mouth daily. (Patient not taking: Reported on  03/10/2022) 90 tablet 3   furosemide (LASIX) 20 MG tablet Take 1 tablet (20 mg total) by mouth daily. 90 tablet 3   No facility-administered medications prior to visit.   Allergies  Allergen Reactions   Beta Adrenergic Blockers Other (See Comments)    Trigger Asthma   Carvedilol Shortness Of Breath    Reacts with albuterol, worsens asthma symptoms when taken together    Peanut-Containing Drug Products Other (See Comments)     Trigger Asthma attack   Crestor [Rosuvastatin]     myalgias   Iodinated Contrast Media Other (See Comments)    Pt states that she has never had any reaction what so ever to contrast.    Statins Other (See Comments)   Zocor [Simvastatin]     myalgias   Objective:   Today's Vitals   08/03/22 1304  BP: 124/82  Pulse: 80  Temp: 98.3 F (  36.8 C)  TempSrc: Temporal  SpO2: 95%  Weight: (!) 331 lb (150.1 kg)  Height: '5\' 10"'$  (1.778 m)   Body mass index is 47.49 kg/m.   General: Well developed, well nourished. No acute distress. Skin: Warm and dry. No rashes noted on the hands. Cap refill < 2 sec.Marland Kitchen Neuro: CN II-XII intact. Normal sensation and DTR bilaterally. Psych: Alert and oriented. Normal mood and affect.  Health Maintenance Due  Topic Date Due   Medicare Annual Wellness (AWV)  Never done   HIV Screening  Never done   Hepatitis C Screening  Never done   DTaP/Tdap/Td (1 - Tdap) Never done   Zoster Vaccines- Shingrix (1 of 2) Never done   PAP SMEAR-Modifier  Never done   Pneumonia Vaccine 72+ Years old (1 - PCV) Never done   DEXA SCAN  Never done     Assessment & Plan:   1. Essential hypertension Blood pressure is adequately managed. Continue mlodipine 5 mg daily, enalapril 20 mg bid and HCTZ 12.5 mg daily  - enalapril (VASOTEC) 20 MG tablet; Take 1 tablet (20 mg total) by mouth 2 (two) times daily.  Dispense: 180 tablet; Refill: 1  2. Mild persistent asthma without complication Asthma is well controlled. She will continue her Trelegy  inhlaer. I will renew her albuterol for PRN use. \ - albuterol (VENTOLIN HFA) 108 (90 Base) MCG/ACT inhaler; Inhale 2 puffs into the lungs every 4 (four) hours as needed for wheezing or shortness of breath.  Dispense: 8 g; Refill: 3  3. Prediabetes We will reassess the A1c. Her last value was elevated at 6.7%, so she may meet criteria for diabetes. If she does, we discussed starting her on metformin.  - Hemoglobin A1c - Glucose, random  4. History of ductal carcinoma in situ (DCIS) of breast MS. 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 She is agreeable to me referring her to [facility] instead.  - MM DIGITAL SCREENING BILATERAL; Future  5. Nausea Managing with PRN Zofran.  - ondansetron (ZOFRAN) 4 MG tablet; Take 1 tablet (4 mg total) by mouth every 8 (eight) hours as needed for nausea or vomiting.  Dispense: 20 tablet; Refill: 0  6. Dyshydrosis Continue hydroxyzine for itching.  - hydrOXYzine (VISTARIL) 25 MG capsule; Take 1 capsule (25 mg total) by mouth 4 (four) times daily as needed.  Dispense: 30 capsule; Refill: 5  7. Postmenopausal 8. Screening for osteoporosis  - DG Bone Density; Future   Return in about 3 months (around 11/02/2022) for Reassessment.   [name], MD

## 2022-08-04 ENCOUNTER — Other Ambulatory Visit: Payer: Self-pay | Admitting: Family Medicine

## 2022-08-04 DIAGNOSIS — L301 Dyshidrosis [pompholyx]: Secondary | ICD-10-CM

## 2022-08-12 ENCOUNTER — Other Ambulatory Visit: Payer: Self-pay | Admitting: Family Medicine

## 2022-08-12 DIAGNOSIS — R928 Other abnormal and inconclusive findings on diagnostic imaging of breast: Secondary | ICD-10-CM

## 2022-08-15 ENCOUNTER — Ambulatory Visit (HOSPITAL_COMMUNITY)
Admission: EM | Admit: 2022-08-15 | Discharge: 2022-08-15 | Disposition: A | Discharge status: Other Acute Inpatient Hospital | Payer: Medicare Other | Attending: Family Medicine | Admitting: Family Medicine

## 2022-08-15 ENCOUNTER — Encounter (HOSPITAL_COMMUNITY): Payer: Self-pay

## 2022-08-15 ENCOUNTER — Emergency Department (HOSPITAL_COMMUNITY): Payer: Medicare Other

## 2022-08-15 ENCOUNTER — Emergency Department (HOSPITAL_COMMUNITY)
Admission: EM | Admit: 2022-08-15 | Discharge: 2022-08-16 | Disposition: A | Discharge status: Home / Self Care | Payer: Medicare Other | Attending: Emergency Medicine | Admitting: Emergency Medicine

## 2022-08-15 ENCOUNTER — Other Ambulatory Visit: Payer: Self-pay

## 2022-08-15 DIAGNOSIS — E119 Type 2 diabetes mellitus without complications: Secondary | ICD-10-CM | POA: Insufficient documentation

## 2022-08-15 DIAGNOSIS — K5900 Constipation, unspecified: Secondary | ICD-10-CM | POA: Diagnosis not present

## 2022-08-15 DIAGNOSIS — R600 Localized edema: Secondary | ICD-10-CM | POA: Diagnosis not present

## 2022-08-15 DIAGNOSIS — M5459 Other low back pain: Secondary | ICD-10-CM | POA: Diagnosis not present

## 2022-08-15 DIAGNOSIS — R1084 Generalized abdominal pain: Secondary | ICD-10-CM

## 2022-08-15 DIAGNOSIS — Z853 Personal history of malignant neoplasm of breast: Secondary | ICD-10-CM | POA: Insufficient documentation

## 2022-08-15 DIAGNOSIS — I10 Essential (primary) hypertension: Secondary | ICD-10-CM | POA: Diagnosis not present

## 2022-08-15 DIAGNOSIS — Z7952 Long term (current) use of systemic steroids: Secondary | ICD-10-CM | POA: Insufficient documentation

## 2022-08-15 DIAGNOSIS — Z7951 Long term (current) use of inhaled steroids: Secondary | ICD-10-CM | POA: Insufficient documentation

## 2022-08-15 DIAGNOSIS — J449 Chronic obstructive pulmonary disease, unspecified: Secondary | ICD-10-CM | POA: Insufficient documentation

## 2022-08-15 DIAGNOSIS — J45909 Unspecified asthma, uncomplicated: Secondary | ICD-10-CM | POA: Diagnosis not present

## 2022-08-15 DIAGNOSIS — R109 Unspecified abdominal pain: Secondary | ICD-10-CM | POA: Diagnosis not present

## 2022-08-15 LAB — BRAIN NATRIURETIC PEPTIDE: B Natriuretic Peptide: 234 pg/mL — ABNORMAL HIGH (ref 0.0–100.0)

## 2022-08-15 LAB — POCT URINALYSIS DIPSTICK, ED / UC
Bilirubin Urine: NEGATIVE
Glucose, UA: NEGATIVE mg/dL
Hgb urine dipstick: NEGATIVE
Ketones, ur: NEGATIVE mg/dL
Leukocytes,Ua: NEGATIVE
Nitrite: NEGATIVE
Protein, ur: NEGATIVE mg/dL
Specific Gravity, Urine: 1.015 (ref 1.005–1.030)
Urobilinogen, UA: 0.2 mg/dL (ref 0.0–1.0)
pH: 7.5 (ref 5.0–8.0)

## 2022-08-15 LAB — COMPREHENSIVE METABOLIC PANEL
ALT: 30 U/L (ref 0–44)
AST: 30 U/L (ref 15–41)
Albumin: 4 g/dL (ref 3.5–5.0)
Alkaline Phosphatase: 80 U/L (ref 38–126)
Anion gap: 10 (ref 5–15)
BUN: 11 mg/dL (ref 8–23)
CO2: 23 mmol/L (ref 22–32)
Calcium: 9.2 mg/dL (ref 8.9–10.3)
Chloride: 104 mmol/L (ref 98–111)
Creatinine, Ser: 0.93 mg/dL (ref 0.44–1.00)
GFR, Estimated: >60 mL/min (ref >60)
Glucose, Bld: 119 mg/dL — ABNORMAL HIGH (ref 70–99)
Potassium: 4.3 mmol/L (ref 3.5–5.1)
Sodium: 137 mmol/L (ref 135–145)
Total Bilirubin: 0.7 mg/dL (ref 0.3–1.2)
Total Protein: 8.4 g/dL — ABNORMAL HIGH (ref 6.5–8.1)

## 2022-08-15 LAB — URINALYSIS, ROUTINE W REFLEX MICROSCOPIC
Bilirubin Urine: NEGATIVE
Glucose, UA: NEGATIVE mg/dL
Hgb urine dipstick: NEGATIVE
Ketones, ur: NEGATIVE mg/dL
Leukocytes,Ua: NEGATIVE
Nitrite: NEGATIVE
Protein, ur: NEGATIVE mg/dL
Specific Gravity, Urine: 1.008 (ref 1.005–1.030)
pH: 6 (ref 5.0–8.0)

## 2022-08-15 LAB — CBC
HCT: 44.8 % (ref 36.0–46.0)
Hemoglobin: 13.9 g/dL (ref 12.0–15.0)
MCH: 25.7 pg — ABNORMAL LOW (ref 26.0–34.0)
MCHC: 31 g/dL (ref 30.0–36.0)
MCV: 83 fL (ref 80.0–100.0)
Platelets: 476 10*3/uL — ABNORMAL HIGH (ref 150–400)
RBC: 5.4 MIL/uL — ABNORMAL HIGH (ref 3.87–5.11)
RDW: 14.6 % (ref 11.5–15.5)
WBC: 13.3 10*3/uL — ABNORMAL HIGH (ref 4.0–10.5)
nRBC: 0 % (ref 0.0–0.2)

## 2022-08-15 LAB — LIPASE, BLOOD: Lipase: 25 U/L (ref 11–51)

## 2022-08-15 NOTE — ED Provider Triage Note (Addendum)
Emergency Medicine Provider Triage Evaluation Note  Kelli Horn , a 66 y.o. female  was evaluated in triage.  Pt complains of right flank pain, abdominal distention, bilateral leg swelling. Went to UC earlier today thinking she might have a kidney stone, but was told her urine was fine and was discharged. Feeling constipated, only had small BM this afternoon. Hx of HTN and fluid retention, does not take lasix regularly.   Review of Systems  Positive: Flank pain, abd swelling, leg swelling, constipation Negative: CP, SOB, nausea, vomiting  Physical Exam  BP (!) 190/164 (BP Location: Left Arm)   Pulse 97   Temp 98.3 F (36.8 C) (Oral)   Resp 17   Ht '5\' 10"'$  (1.778 m)   Wt (!) 149.7 kg   LMP 12/09/2013   SpO2 97%   BMI 47.35 kg/m  Gen:   Awake, no distress   Resp:  Normal effort  MSK:   Moves extremities without difficulty  Other:    Medical Decision Making  Medically screening exam initiated at 8:27 PM.  Appropriate orders placed.  Kelli Horn was informed that the remainder of the evaluation will be completed by another provider, this initial triage assessment does not replace that evaluation, and the importance of remaining in the ED until their evaluation is complete.  Hypertensive with signs of fluid retention. Most recent echo in 2021 with EF 55-60%   Kelli Horn T, PA-C 08/15/22 2029

## 2022-08-15 NOTE — ED Triage Notes (Signed)
Patient states that she is having abdominal pain since Thursday. This she has a blockage in her kidney

## 2022-08-15 NOTE — ED Triage Notes (Signed)
Pt reports lower back pain x 2 days Pt reports she is bloated. Pt thinks she has kidney stones.

## 2022-08-16 MED ORDER — IBUPROFEN 200 MG PO TABS
600.0000 mg | ORAL_TABLET | Freq: Once | ORAL | Status: AC
  Administered 2022-08-16: 600 mg via ORAL
  Filled 2022-08-16: qty 3

## 2022-08-16 MED ORDER — KETOROLAC TROMETHAMINE 15 MG/ML IJ SOLN
30.0000 mg | Freq: Once | INTRAMUSCULAR | Status: DC
  Filled 2022-08-16: qty 2

## 2022-08-16 NOTE — ED Provider Notes (Signed)
[facility] DEPT Provider Note   CSN: [id number] Arrival date & time: 08/15/22  5329     History  Chief Complaint  Patient presents with   Abdominal Pain    Kelli Horn is a 66 y.o. female.  HPI     This is a 66 year old female who presents with abdominal and back pain.  Patient reports that she had some teeth pulled earlier this week.  Subsequently she woke up the next morning with some back pain.  She denies injury or heavy lifting.  She states the pain has been fairly constant.  She also has reported some abdominal bloating.  She has been eating without difficulty but states that she sometimes feels nauseous.  Additionally, she states that she has had difficulty having bowel movements and is only having small hard bowel movements.  Patient took Tylenol and ibuprofen with minimal relief.  She states that she thought she maybe had a kidney infection and began taking amoxicillin which she was given after having her teeth pulled.  This does not seem to help.  She denies fevers.  Home Medications Prior to Admission medications   Medication Sig Start Date End Date Taking? Authorizing Provider  albuterol (VENTOLIN HFA) 108 (90 Base) MCG/ACT inhaler Inhale 2 puffs into the lungs every 4 (four) hours as needed for wheezing or shortness of breath. 08/03/22   [name], MD  amLODipine (NORVASC) 5 MG tablet TAKE 1 TABLET BY MOUTH EVERY DAY 03/25/22   Martinique, Peter M, MD  aspirin EC 81 MG tablet Take 81 mg by mouth daily. Swallow whole.    [provider]  enalapril (VASOTEC) 20 MG tablet Take 1 tablet (20 mg total) by mouth 2 (two) times daily. 08/03/22   [name], MD  fluticasone (FLONASE) 50 MCG/ACT nasal spray Place 1 spray into both nostrils daily. Patient not taking: Reported on 03/10/2022 07/22/21   [name], MD  Fluticasone-Umeclidin-[facility] ELLIPTA) 100-62.5-25 MCG/ACT AEPB INHALE 1 PUFF BY MOUTH EVERY DAY  03/24/22   Rigoberto Noel, MD  hydrochlorothiazide (MICROZIDE) 12.5 MG capsule Take 1 capsule (12.5 mg total) by mouth daily. 11/27/21   Martinique, Peter M, MD  hydrOXYzine (VISTARIL) 25 MG capsule Take 1 capsule (25 mg total) by mouth 4 (four) times daily as needed. 08/03/22   [name], MD  loratadine (CLARITIN) 10 MG tablet Take 1 tablet (10 mg total) by mouth daily. 03/10/22   Rigoberto Noel, MD  metFORMIN (GLUCOPHAGE) 500 MG tablet Take 1 tablet (500 mg total) by mouth daily with breakfast. 08/03/22   [name], MD  ondansetron (ZOFRAN) 4 MG tablet Take 1 tablet (4 mg total) by mouth every 8 (eight) hours as needed for nausea or vomiting. 08/03/22   [name], MD  Polyethyl Glycol-Propyl Glycol 0.4-0.3 % SOLN Place 1 drop into both eyes 3 (three) times daily as needed (dry eyes).     [provider]      Allergies    Beta adrenergic blockers, Carvedilol, Peanut-containing drug products, Crestor [rosuvastatin], Iodinated contrast media, Statins, and Zocor [simvastatin]    Review of Systems   Review of Systems  Constitutional:  Negative for fever.  Respiratory:  Negative for shortness of breath.   Cardiovascular:  Negative for chest pain.  Gastrointestinal:  Positive for abdominal pain and constipation.  Genitourinary:  Negative for dysuria.  Musculoskeletal:  Positive for back pain.  All other systems reviewed and are negative.  Physical Exam Updated Vital Signs BP (!) 190/164 (BP Location: Left Arm)   Pulse 97   Temp 98.3 F (36.8 C) (Oral)   Resp 17   Ht 1.778 m ('5\' 10"'$ )   Wt (!) 149.7 kg   LMP 12/09/2013   SpO2 97%   BMI 47.35 kg/m  Physical Exam Vitals and nursing note reviewed.  Constitutional:      Appearance: She is well-developed. She is obese. She is not ill-appearing.  HENT:     Head: Normocephalic and atraumatic.  Eyes:     Pupils: Pupils are equal, round, and reactive to light.  Cardiovascular:     Rate and Rhythm: Normal rate and  regular rhythm.     Heart sounds: Normal heart sounds.  Pulmonary:     Effort: Pulmonary effort is normal. No respiratory distress.     Breath sounds: No wheezing.  Abdominal:     General: Bowel sounds are normal.     Palpations: Abdomen is soft.     Tenderness: There is no abdominal tenderness. There is no right CVA tenderness, left CVA tenderness, guarding or rebound.  Musculoskeletal:     Cervical back: Neck supple.  Skin:    General: Skin is warm and dry.  Neurological:     Mental Status: She is alert and oriented to person, place, and time.  Psychiatric:        Mood and Affect: Mood normal.     ED Results / Procedures / Treatments   Labs (all labs ordered are listed, but only abnormal results are displayed) Labs Reviewed  COMPREHENSIVE METABOLIC PANEL - Abnormal; Notable for the following components:      Result Value   Glucose, Bld 119 (*)    Total Protein 8.4 (*)    All other components within normal limits  CBC - Abnormal; Notable for the following components:   WBC 13.3 (*)    RBC 5.40 (*)    MCH 25.7 (*)    Platelets 476 (*)    All other components within normal limits  URINALYSIS, ROUTINE W REFLEX MICROSCOPIC - Abnormal; Notable for the following components:   Color, Urine STRAW (*)    All other components within normal limits  BRAIN NATRIURETIC PEPTIDE - Abnormal; Notable for the following components:   B Natriuretic Peptide 234.0 (*)    All other components within normal limits  LIPASE, BLOOD    EKG None  Radiology CT ABDOMEN PELVIS WO CONTRAST  Result Date: 08/15/2022 CLINICAL DATA:  Right flank pain and abdominal distention. Constipation for 4 days EXAM: CT ABDOMEN AND PELVIS WITHOUT CONTRAST TECHNIQUE: Multidetector CT imaging of the abdomen and pelvis was performed following the standard protocol without IV contrast. RADIATION DOSE REDUCTION: This exam was performed according to the departmental dose-optimization program which includes automated  exposure control, adjustment of the mA and/or kV according to patient size and/or use of iterative reconstruction technique. COMPARISON:  CT 08/29/2020 FINDINGS: Lower chest: No acute abnormality. Hepatobiliary: Stone within the neck of the gallbladder. No evidence of cholecystitis. No biliary dilation. No suspicious liver lesion. Pancreas: Unremarkable. No pancreatic ductal dilatation or surrounding inflammatory changes. Spleen: Normal in size without focal abnormality. Adrenals/Urinary Tract: Unremarkable adrenal glands. Cortical renal scarring inferior pole right kidney. No urinary calculi or hydronephrosis. Unremarkable bladder. Stomach/Bowel: Normal caliber large and small bowel. Moderate colonic stool load greatest in the right colon. No bowel wall thickening or pericolonic edema. Unremarkable stomach. Vascular/Lymphatic: Advanced aorto bi-iliac atherosclerotic calcification. Ectasia of the infrarenal abdominal  aortic and measuring 2.4 cm. No enlarged abdominopelvic lymph nodes. Reproductive: Unremarkable. Other: No free intraperitoneal fluid or air. Musculoskeletal: No acute osseous abnormality. IMPRESSION: No acute abnormality in the abdomen or pelvis. Moderate colonic stool load greatest in the right colon. Cholelithiasis. Advanced aorto bi-iliac athero sclerotic calcification. Electronically Signed   By: Placido Sou M.D.   On: 08/15/2022 21:30    Procedures Procedures    Medications Ordered in ED Medications  ketorolac (TORADOL) 15 MG/ML injection 30 mg (has no administration in time range)    ED Course/ Medical Decision Making/ A&P                           Medical Decision Making Risk Prescription drug management.   This patient presents to the ED for concern of back pain, abdominal pain, this involves an extensive number of treatment options, and is a complaint that carries with it a high risk of complications and morbidity.  I considered the following differential and admission  for this acute, potentially life threatening condition.  The differential diagnosis includes kidney stone, pyelonephritis, cholecystitis, appendicitis, constipation, SBO, less likely AAA  MDM:    66 year old female who presents with back and abdominal pain.  She is nontoxic and vital signs are reassuring.  Her exam is very benign.  She does not have significant reproducible abdominal pain.  There is no objective bloating.  She is afebrile.  She is notably hypertensive.  She has a history of the same and reports compliance with her medications.  Labs obtained.  Slight leukocytosis.  No evidence of UTI.  No significant metabolic derangements.  CT scan does not show any evidence of kidney stone.  She does have a moderate stool burden.  I independently reviewed this myself.  No evidence of SBO.  Suspect patient's symptoms may be related to ongoing constipation especially given history.  Reports she has tried MiraLAX once a day.  Recommend increasing to twice daily and titrating up until she starts having soft bowel movements.  She can take Tylenol or ibuprofen as needed for discomfort.   (Labs, imaging, consults)  Labs: I Ordered, and personally interpreted labs.  The pertinent results include: CBC, CMP, lipase, urinalysis.  Imaging Studies ordered: I ordered imaging studies including CT abdomen pelvis I independently visualized and interpreted imaging. I agree with the radiologist interpretation  Additional history obtained from family at bedside.  External records from outside source obtained and reviewed including urgent care notes  Cardiac Monitoring: The patient was maintained on a cardiac monitor.  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 Sleep study done Jun 20 2020   SOB (shortness of breath)    Swallowing difficulty      Medicines Meds ordered this encounter  Medications   ketorolac (TORADOL) 15 MG/ML injection 30 mg    I have reviewed the patients home medicines and have made adjustments as needed  Problem List / ED Course: Problem List Items Addressed This Visit   None Visit Diagnoses     Constipation, unspecified constipation type    -  Primary                   Final Clinical Impression(s) / ED Diagnoses Final diagnoses:  Constipation, unspecified constipation type    Rx / DC Orders ED Discharge Orders     None         Merryl Hacker, MD 08/16/22 0101

## 2022-08-16 NOTE — Discharge Instructions (Signed)
Seen today for back pain and abdominal pain and bloating.  Your workup today is reassuring including basic lab work and CT scan.  There is no evidence of kidney infection or kidney stone.  Given your recent history of hard small stools and CT findings that show moderate stool burden, some of your symptoms may be related to constipation.  Increase MiraLAX to 1 capful twice daily.  Make sure that you are increasing your water and fiber intake.  You may take Tylenol or ibuprofen as needed for any discomfort.

## 2022-08-17 NOTE — ED Provider Notes (Signed)
Kelli Horn   [id number] 08/15/22 Arrival Time: 1700  ASSESSMENT & PLAN:  1. Generalized abdominal pain    Appears to be in considerable pain. Cannot r/o intra-abdominal process. She elects ED evaluation for advanced imaging and workup. By POV. Stable upon discharge.  Reviewed expectations re: course of current medical issues. Questions answered. Outlined signs and symptoms indicating need for more acute intervention. Patient verbalized understanding. After Visit Summary given.   SUBJECTIVE: History from: patient. Kelli Horn is a 66 y.o. female who reports lower back pain x 2 days; fairly abrupt onset. Reports that her abdomen is now painful; "all over and it has swollen". H/O kidney stones. No gross hematuria. Afebrile. With mild nausea. No emesis. No specific urinary symptoms.  Patient's last menstrual period was 12/09/2013.  Past Surgical History:  Procedure Laterality Date   ABDOMINAL AORTOGRAM W/LOWER EXTREMITY Bilateral 08/08/2020   Procedure: ABDOMINAL AORTOGRAM W/LOWER EXTREMITY;  Surgeon: [name], MD;  Location: [facility];  Service: Cardiovascular;  Laterality: Bilateral;   APPENDECTOMY     BIOPSY  06/27/2020   Procedure: BIOPSY;  Surgeon: [name], MD;  Location: [facility];  Service: Endoscopy;;   BREAST LUMPECTOMY WITH RADIOACTIVE SEED AND SENTINEL LYMPH NODE BIOPSY Bilateral 06/07/2020   Procedure: BILATERAL BREAST LUMPECTOMY WITH RADIOACTIVE SEED AND LEFT SENTINEL LYMPH NODE BIOPSY;  Surgeon: [name], MD;  Location: [facility];  Service: General;  Laterality: Bilateral;  PEC BLOCK   COLONOSCOPY WITH PROPOFOL N/A 06/27/2020   Procedure: COLONOSCOPY WITH PROPOFOL;  Surgeon: [name], MD;  Location: [facility];  Service: Endoscopy;  Laterality: N/A;   LEFT HEART CATH AND CORONARY ANGIOGRAPHY N/A 04/23/2020   Procedure: LEFT HEART CATH AND CORONARY ANGIOGRAPHY;  Surgeon: Martinique, Peter M,  MD;  Location: [facility];  Service: Cardiovascular;  Laterality: N/A;   POLYPECTOMY  06/27/2020   Procedure: POLYPECTOMY;  Surgeon: [name], MD;  Location: [facility];  Service: Endoscopy;;   ROBOTIC ASSISTED TOTAL HYSTERECTOMY WITH BILATERAL SALPINGO OOPHERECTOMY N/A 07/11/2020   Procedure: XI ROBOTIC ASSISTED TOTAL HYSTERECTOMY WITH BILATERAL SALPINGO OOPHORECTOMY;  Surgeon: [name], MD;  Location: [facility];  Service: Gynecology;  Laterality: N/A;     OBJECTIVE:  Vitals:   08/15/22 1755  BP: (!) 164/101  Pulse: 95  Resp: 16  Temp: 97.8 F (36.6 C)  TempSrc: Oral  SpO2: 99%    General appearance: alert, oriented, no acute distress; appears uncomfortable HEENT: [place]; AT; oropharynx moist Lungs: unlabored respirations Abdomen: soft; with mild distention; mild  and poorly localized tenderness to palpation over upper mid abdomen ; normal bowel sounds; without masses or organomegaly; without guarding or rebound tenderness Back: without reported CVA tenderness; FROM at waist Extremities: without LE edema; symmetrical; without gross deformities Skin: warm and dry Neurologic: normal gait Psychological: alert and cooperative; normal mood and affect  Labs: Results for orders placed or performed during the hospital encounter of 08/15/22  POC Urinalysis dipstick  Result Value Ref Range   Glucose, UA NEGATIVE NEGATIVE mg/dL   Bilirubin Urine NEGATIVE NEGATIVE   Ketones, ur NEGATIVE NEGATIVE mg/dL   Specific Gravity, Urine 1.015 1.005 - 1.030   Hgb urine dipstick NEGATIVE NEGATIVE   pH 7.5 5.0 - 8.0   Protein, ur NEGATIVE NEGATIVE mg/dL   Urobilinogen, UA 0.2 0.0 - 1.0 mg/dL   Nitrite NEGATIVE NEGATIVE   Leukocytes,Ua NEGATIVE NEGATIVE   Labs Reviewed  POCT URINALYSIS DIPSTICK, ED / UC     Allergies  Allergen Reactions   Beta Adrenergic Blockers Other (See Comments)    Trigger Asthma   Carvedilol Shortness Of Breath    Reacts with albuterol, worsens  asthma symptoms when taken together    Peanut-Containing Drug Products Other (See Comments)     Trigger Asthma attack   Crestor [Rosuvastatin]     myalgias   Iodinated Contrast Media Other (See Comments)    Pt states that she has never had any reaction what so ever to contrast.    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  Tobacco Use   Smoking status: Former    Packs/day: 2.00    Years: 10.00    Total pack years: 20.00    Types: Cigarettes    Quit date: 04/16/2004    Years since quitting: 18.3   Smokeless tobacco: Never  Vaping Use   Vaping Use: Never used  Substance and Sexual Activity   Alcohol use: No   Drug use: No   Sexual activity: Not Currently  Other Topics Concern   Not on file  Social History Narrative   Not on file   Social Determinants of Health   Financial Resource Strain:  Medium Risk (01/01/2022)   Overall Financial Resource Strain (CARDIA)    Difficulty of Paying Living Expenses: Somewhat hard  Food Insecurity: Not on file  Transportation Needs: Not on file  Physical Activity: Not on file  Stress: Not on file  Social Connections: Not on file  Intimate Partner Violence: Not on file    Family History  Problem Relation Age of Onset   Lung cancer Mother    Schizophrenia Mother    Heart attack Father 70   Aneurysm Father    Diabetes Father    Hypertension Father    High Cholesterol Father    Stroke Father    Obesity Father    AAA (abdominal aortic  aneurysm) Father    Breast cancer Maternal Aunt    Heart disease Paternal Uncle    Aneurysm Paternal Uncle    Diabetes Other    Colon cancer Neg Hx    Cancer - Ovarian Neg Hx    Endometrial cancer Neg Hx    Pancreatic cancer Neg Hx    Prostate cancer Neg Hx      Vanessa Kick, MD 08/17/22 1213

## 2022-08-25 ENCOUNTER — Telehealth: Payer: Self-pay | Admitting: *Deleted

## 2022-08-25 NOTE — Telephone Encounter (Signed)
        Patient  visited [facility] on 08/16/2022  for treatment    Telephone encounter attempt :  1st  No answer   [facility] [phone] [address] , [place] [address] Email : [email or web address]

## 2022-08-27 ENCOUNTER — Telehealth: Payer: Self-pay | Admitting: *Deleted

## 2022-08-27 NOTE — Telephone Encounter (Signed)
        Patient  visited [facility] on 08/16/2022  for treatment    Telephone encounter attempt :  2nd  No answer   [facility] [phone] [address] , [place] [address] Email : [email or web address]

## 2022-09-01 NOTE — Progress Notes (Deleted)
Cardiology Office Note   Date:  09/01/2022   ID:  Horn, Kelli October 17, 1955, MRN [id number]  PCP:  [name], MD  Cardiologist:   Laquia Rosano Martinique, MD   No chief complaint on file.     History of Present Illness: Kelli Horn is a 67 y.o. female who is seen for follow up  SOB. She has a history of DM, HLD,  and HTN. Also severe PAD but nonobstructive CAD. She was seen in the past by Dr Debara Pickett in 2017. Myoview at that time was normal. Echo showed severe LVH with normal systolic function and normal valves. Ecg was normal.   Other studies of note include plain X rays dating back to 2017 that showed significant aortic atherosclerosis. She recently had Abdominal/pelvic CT on 04/05/20 showing severe bilateral common iliac stenosis. 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 There was medical renal disease. She has an [name] report apparently showing a 2.9 cm gallstone but no gallstone noted on CT.  We evaluated her with cardiac cath showing mild nonobstructive CAD. No significant right subclavian/innominate gradient. Mildly elevated LVEDP. Echo showed mild LVH with normal systolic function. Arterial doppler studies show severe bilateral iliac disease with moderate bilateral reduced ABIs and reduced toe pressures. No significant carotid disease but bilateral subclavian disease noted. She did have PFTs showing severe obstructive disease. Pulmonary evaluation was done who felt she had predominantly asthma. Was started on Trelegy and she noted significant improvement in her breathing. She did have a home sleep study in October showing mild OSA. Being managed by Dr [name]. She also had colonoscopy in October showing multiple polyps that were removed.   She has also was diagnosed with left breast carcinoma. She underwent bilateral breast lumpectomies on October 8. She then underwent total hysterectomy with BSO for endometrial CA on 07/11/20. She decided  against RT for her breast CA. Followed by Dr Burr Medico. 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 No endovascular options. Was seen by [facility]- Dr [name]. It was felt that surgery would be beneficial but with higher risk due to morbid obesity. Was referred to Weight management to see if she could lose weight to improve her operative risk. She was placed on Rybelsius but unable to tolerate. On his last visit with Dr [name] in August she had gained 10 lbs and continued conservative therapy recommended unless symptoms progress.   On follow up she reports she had Covid in January. Had significant SOB. Reports SOB has persisted. No cough. Has not been taking HCTZ. Only taking Crestor once a week due to myalgias. She did have carotid dopplers in September with incidental finding of enlarged lymph nodes in her neck. This has not been evaluated yet.     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 Sleep study done Jun 20 2020   SOB (shortness of breath)    Swallowing difficulty     Past Surgical History:  Procedure Laterality Date   ABDOMINAL AORTOGRAM W/LOWER EXTREMITY Bilateral 08/08/2020   Procedure: ABDOMINAL AORTOGRAM W/LOWER EXTREMITY;  Surgeon: [name], MD;  Location: [facility];  Service: Cardiovascular;   Laterality: Bilateral;   APPENDECTOMY     BIOPSY  06/27/2020   Procedure: BIOPSY;  Surgeon: [name], MD;  Location: [facility];  Service: Endoscopy;;   BREAST LUMPECTOMY WITH RADIOACTIVE SEED AND SENTINEL LYMPH NODE BIOPSY Bilateral 06/07/2020   Procedure: BILATERAL BREAST LUMPECTOMY WITH RADIOACTIVE SEED AND LEFT SENTINEL LYMPH NODE BIOPSY;  Surgeon: [name], MD;  Location: [facility];  Service: General;  Laterality: Bilateral;  PEC BLOCK   COLONOSCOPY WITH PROPOFOL N/A 06/27/2020   Procedure: COLONOSCOPY WITH PROPOFOL;  Surgeon: [name], MD;  Location: [facility];  Service: Endoscopy;  Laterality: N/A;   LEFT HEART CATH AND CORONARY ANGIOGRAPHY N/A 04/23/2020   Procedure: LEFT HEART CATH AND CORONARY ANGIOGRAPHY;  Surgeon: Martinique, Zabrina Brotherton M, MD;  Location: [facility];  Service: Cardiovascular;  Laterality: N/A;   POLYPECTOMY  06/27/2020   Procedure: POLYPECTOMY;  Surgeon: [name], MD;  Location: [facility];  Service: Endoscopy;;   ROBOTIC ASSISTED TOTAL HYSTERECTOMY WITH BILATERAL SALPINGO OOPHERECTOMY N/A 07/11/2020   Procedure: XI ROBOTIC ASSISTED TOTAL HYSTERECTOMY WITH BILATERAL SALPINGO OOPHORECTOMY;  Surgeon: [name], MD;  Location: [facility];  Service: Gynecology;  Laterality: N/A;     Current Outpatient Medications  Medication Sig Dispense Refill   albuterol (VENTOLIN HFA) 108 (90 Base) MCG/ACT inhaler Inhale 2 puffs into the lungs every 4 (four) hours as needed for wheezing or shortness of breath. 8 g 3   amLODipine (NORVASC) 5 MG tablet TAKE 1 TABLET BY MOUTH EVERY DAY 90 tablet 3   aspirin EC 81 MG tablet Take 81 mg by mouth daily. Swallow whole.     enalapril (VASOTEC) 20 MG tablet Take 1 tablet (20 mg total) by mouth 2 (two) times daily. 180 tablet 1   fluticasone (FLONASE) 50 MCG/ACT nasal spray Place 1 spray into both nostrils daily. (Patient not taking: Reported on 03/10/2022) 16 g 6   Fluticasone-Umeclidin-Vilant  (TRELEGY ELLIPTA) 100-62.5-25 MCG/ACT AEPB INHALE 1 PUFF BY MOUTH EVERY DAY 60 each 11   hydrochlorothiazide (MICROZIDE) 12.5 MG capsule Take 1 capsule (12.5 mg total) by mouth daily. 90 capsule 3   hydrOXYzine (VISTARIL) 25 MG capsule Take 1 capsule (25 mg total) by mouth 4 (four) times daily as needed. 30 capsule 5   loratadine (CLARITIN) 10 MG tablet Take 1 tablet (10 mg total) by mouth daily. 30 tablet 5   metFORMIN (GLUCOPHAGE) 500 MG tablet Take 1 tablet (500 mg total) by mouth daily with breakfast. 90 tablet 3   ondansetron (ZOFRAN) 4 MG tablet Take 1 tablet (4 mg total) by mouth every 8 (eight) hours as needed for nausea or vomiting. 20 tablet 0   Polyethyl Glycol-Propyl Glycol 0.4-0.3 % SOLN Place 1 drop into both eyes 3 (three) times daily as needed (dry eyes).      No current facility-administered medications for this visit.    Allergies:   Beta adrenergic blockers, Carvedilol, Peanut-containing drug  products, Crestor [rosuvastatin], Iodinated contrast media, Statins, and Zocor [simvastatin]    Social History:  The patient  reports that she quit smoking about 18 years ago. Her smoking use included cigarettes. She has a 20.00 pack-year smoking history. She has never used smokeless tobacco. She reports that she does not drink alcohol and does not use drugs.   Family History:  The patient's family history includes AAA (abdominal aortic aneurysm) in her father; Aneurysm in her father and paternal uncle; Breast cancer in her maternal aunt; Diabetes in her father and another family member; Heart attack (age of onset: 42) in her father; Heart disease in her paternal uncle; High Cholesterol in her father; Hypertension in her father; Lung cancer in her mother; Obesity in her father; Schizophrenia in her mother; Stroke in her father.    ROS:  Please see the history of present illness.   Otherwise, review of systems are negative.   All other systems are reviewed and negative.    PHYSICAL  EXAM: VS:  LMP 12/09/2013  , BMI There is no height or weight on file to calculate BMI. GEN: Well nourished, morbidly obese, in no acute distress  HEENT: normal  Neck: no JVD,  Left carotid and bilateral subclavian bruits.  Cardiac: RRR; no murmurs, rubs, or gallops,no edema. Femoral and pedal pulses are not palpable.  Respiratory:  clear to auscultation bilaterally, normal work of breathing GI: morbidly obese with large panus. Soft, nontender, nondistended, + BS. I don't hear renal bruits MS: no deformity or atrophy  Skin: warm and dry, no rash Neuro:  Strength and sensation are intact Psych: euthymic mood, full affect   EKG:  EKG is  ordered today. NSR rate 83. Poor R wave progression. Low voltage. No acute change. I have personally reviewed and interpreted this study.    Recent Labs: 08/15/2022: ALT 30; B Natriuretic Peptide 234.0; BUN 11; Creatinine, Ser 0.93; Hemoglobin 13.9; Platelets 476; Potassium 4.3; Sodium 137    Lipid Panel    Component Value Date/Time   CHOL 194 02/11/2021 1357   CHOL 196 04/19/2020 1144   TRIG 141.0 02/11/2021 1357   HDL 40.60 02/11/2021 1357   HDL 35 (L) 04/19/2020 1144   CHOLHDL 5 02/11/2021 1357   VLDL 28.2 02/11/2021 1357   LDLCALC 125 (H) 02/11/2021 1357   LDLCALC 133 (H) 04/19/2020 1144   Additional labs from Dr Arelia Sneddon: dated 02/07/20: D dimer normal. BNP 54. Glucose 118. UA 7.4. otherwise CMET normal. CBC normal Hgb 14.7. TFTS normal. Cholesterol 207, triglycerides 137, HDL 40, LDL 142.  Urinalysis normal.  Wt Readings from Last 3 Encounters:  08/15/22 (!) 330 lb (149.7 kg)  08/03/22 (!) 331 lb (150.1 kg)  03/10/22 (!) 320 lb 3.2 oz (145.2 kg)      Other studies Reviewed: Additional studies/ records that were reviewed today include:   Myoview 12/26/15: Study Highlights  Nuclear stress EF: 62%. There was no ST segment deviation noted during stress. No T wave inversion was noted during stress. The study is normal. This is a low  risk study. The left ventricular ejection fraction is normal (55-65%).   Echo 12/11/15: Study Conclusions   - Left ventricle: The cavity size was normal. Wall thickness was    increased in a pattern of severe LVH. Systolic function was    normal. The estimated ejection fraction was in the range of 60%    to 65%. Doppler parameters are consistent with abnormal left    ventricular relaxation (grade 1 diastolic  dysfunction). The E/e&'    ratio is between 8-15, suggesting indeterminate LV filling    pressure.  - Aortic valve: Trileaflet. Sclerosis without stenosis. There was    no regurgitation.  - Mitral valve: Calcified annulus. There was trivial regurgitation.  - Left atrium: The atrium was mildly dilated.  - Tricuspid valve: There was trivial regurgitation.  - Pulmonary arteries: PA peak pressure: 32 mm Hg (S).  - Inferior vena cava: The vessel was dilated. The respirophasic    diameter changes were blunted (< 50%), consistent with elevated    central venous pressure.   Cardiac cath 04/23/20:  LEFT HEART CATH AND CORONARY ANGIOGRAPHY  Conclusion    Mid RCA lesion is 25% stenosed. Mid LAD lesion is 25% stenosed. The left ventricular systolic function is normal. LV end diastolic pressure is mildly elevated. The left ventricular ejection fraction is greater than 65% by visual estimate.   1. Nonobstructive CAD 2. Normal LV function 3. Mildly elevated LVEDP 4. Hypertensive 5. No significant right innominate or subclavian pressure gradient.    Plan: will optimize BP control. Will arrange PFTs, Echo, Carotid and LE arterial dopplers. She is intolerant of beta blockers. Will initiate amlodipine in addition to enalapril for BP control.   Echo 04/30/20: IMPRESSIONS     1. Left ventricular ejection fraction, by estimation, is 55 to 60%. The  left ventricle has normal function. The left ventricle has no regional  wall motion abnormalities. There is mild concentric left ventricular   hypertrophy. Left ventricular diastolic  parameters are indeterminate. Elevated left ventricular end-diastolic  pressure.   2. Right ventricular systolic function is normal. The right ventricular  size is normal. Tricuspid regurgitation signal is inadequate for assessing  PA pressure.   3. Left atrial size was mildly dilated.   4. Right atrial size was mildly dilated.   5. The mitral valve is normal in structure. Trivial mitral valve  regurgitation. No evidence of mitral stenosis. Moderate mitral annular  calcification.   6. The aortic valve is tricuspid. There is mild calcification of the  aortic valve. There is mild thickening of the aortic valve. Aortic valve  regurgitation is not visualized. Mild aortic valve sclerosis is present,  with no evidence of aortic valve  stenosis.   7. The inferior vena cava is normal in size with greater than 50%  respiratory variability, suggesting right atrial pressure of 3 mmHg.   Conclusion(s)/Recommendation(s): Otherwise normal echocardiogram, with  minor abnormalities described in the report  LE arterial dopplers:  Aorta-Iliacs: Technically challenging and limited evaluation.   Monophasic waveforms in the bilateral external iliac arteries suggest  severe stenosis and/or occlusion of the bilateral common iliac arteries.  Unable to directly visualize the common iliac arteries due to patient body  habitus and overlying bowel gas.   Right: Near normal infrainguinal examination. No evidence of focal  stenosis.   Left: Near normal infrainguinal examination. No evidence of focal  stenosis.   Summary:  Right: Resting right ankle-brachial index indicates moderate right lower  extremity arterial disease. The right toe-brachial index is abnormal.   Left: Resting left ankle-brachial index indicates moderate left lower  extremity arterial disease. The left toe-brachial index is abnormal.   Carotid dopplers 05/16/20: Summary:  Right Carotid:  Velocities in the right ICA are consistent with a 1-39%  stenosis.   Left Carotid: Velocities in the left ICA are consistent with a 40-59%  stenosis.   Vertebrals:  Left vertebral artery demonstrates antegrade flow. Right  vertebral  artery demonstrates no discernable flow.  Subclavians: Left subclavian artery was stenotic. Right subclavian artery  flow               was disturbed.   *See table(s) above for measurements and observations.  Suggest follow up study in 12 months.   LE arterial dopplers 01/27/22: Bilateral ABIs appear essentially unchanged.    Summary:  Right: Resting right ankle-brachial index indicates moderate right lower  extremity arterial disease. The right toe-brachial index is abnormal.   Left: Resting left ankle-brachial index indicates moderate left lower  extremity arterial disease. The left toe-brachial index is abnormal.    ASSESSMENT AND PLAN:  1.  Severe dyspnea secondary to severe asthma. Reports her breathing is poor. Recommend she follow up with Dr [name].  Fortunately she has no significant obstructive CAD. 2. HTN. Poorly controlled. Continue enalapril and amlodipine. Recommend she resume HCTZ 12.5 mg daily.  3. HLD. on Crestor 5 mg once a week. Goal LDL is < 70 with her severe PAD. She is planning to have Dr Arelia Sneddon check her lab work. I told her to send me a copy. Will then refer to [place] [facility] to look at other options for management such as PCSK 9 inhibitor.  4. Morbid obesity. States it did not work out for her at the weight loss center. Weight is unchanged.  5. PAD. 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 Some bilateral subclavian disease but no gradient on the right with cath. Seen by [facility]. Consider surgery pending efforts at weight loss to reduce risk. For now will continue to monitor.  80. Strong family history of premature vascular disease. 7. Former tobacco abuse.  8. Multiple colon polyps.  S/p removal 9. Left breast carcinoma. S/p bilateral lumpectomies. She has deferred RT. 10. Nonobstructive CAD.  11. Endometrial CA stage 1A s/p total hysterectomy/BSO 12.  OSA per pulmonary. 13. Enlarged lymph nodes in neck noted on carotid dopplers. Needs to follow up with oncology Dr Annamaria Boots.   Current medicines are reviewed at length with the patient today.  The patient does not have concerns regarding medicines.  The following changes have been made:  See above  Labs/ tests ordered today include:   No orders of the defined types were placed in this encounter.   Disposition:   FU with me 6 months. Signed, Shantera Monts Martinique, MD  09/01/2022 11:16 AM    [facility] [address] [place], [place], [address] Phone [phone], Fax [phone]

## 2022-09-07 ENCOUNTER — Ambulatory Visit: Payer: Medicare Other | Admitting: Cardiology

## 2022-09-10 ENCOUNTER — Ambulatory Visit: Payer: Medicare Other | Admitting: Adult Health

## 2022-09-22 ENCOUNTER — Ambulatory Visit (INDEPENDENT_AMBULATORY_CARE_PROVIDER_SITE_OTHER): Payer: Medicare Other | Admitting: Adult Health

## 2022-09-22 ENCOUNTER — Encounter: Payer: Self-pay | Admitting: Adult Health

## 2022-09-22 ENCOUNTER — Ambulatory Visit (INDEPENDENT_AMBULATORY_CARE_PROVIDER_SITE_OTHER): Payer: Medicare Other

## 2022-09-22 VITALS — BP 150/90 | HR 97 | Temp 97.8°F | Ht 70.0 in | Wt 328.6 lb

## 2022-09-22 DIAGNOSIS — J441 Chronic obstructive pulmonary disease with (acute) exacerbation: Secondary | ICD-10-CM

## 2022-09-22 DIAGNOSIS — J4530 Mild persistent asthma, uncomplicated: Secondary | ICD-10-CM | POA: Diagnosis not present

## 2022-09-22 DIAGNOSIS — J9811 Atelectasis: Secondary | ICD-10-CM | POA: Diagnosis not present

## 2022-09-22 DIAGNOSIS — J4540 Moderate persistent asthma, uncomplicated: Secondary | ICD-10-CM

## 2022-09-22 DIAGNOSIS — R059 Cough, unspecified: Secondary | ICD-10-CM | POA: Diagnosis not present

## 2022-09-22 DIAGNOSIS — J45909 Unspecified asthma, uncomplicated: Secondary | ICD-10-CM | POA: Insufficient documentation

## 2022-09-22 DIAGNOSIS — J449 Chronic obstructive pulmonary disease, unspecified: Secondary | ICD-10-CM | POA: Diagnosis not present

## 2022-09-22 MED ORDER — TRELEGY ELLIPTA 100-62.5-25 MCG/ACT IN AEPB: 1.0000 | INHALATION_SPRAY | Freq: Every day | RESPIRATORY_TRACT | 5 refills | Status: DC

## 2022-09-22 MED ORDER — ALBUTEROL SULFATE HFA 108 (90 BASE) MCG/ACT IN AERS: 2.0000 | INHALATION_SPRAY | RESPIRATORY_TRACT | 3 refills | Status: DC | PRN

## 2022-09-22 NOTE — Assessment & Plan Note (Signed)
Moderate persistent asthma with recent exacerbation.  Symptoms seem to be improving with antibiotics..  Will check chest x-ray today.   Trigger prevention.  Continue on Trelegy  Plan  Patient Instructions  Continue on Trelegy 1 puff daily, rinse after use Claritin daily As needed   Albuterol inhaler as needed Activity as tolerated Chest xray today.  Follow-up with Dr. [name] in 6 months and as needed

## 2022-09-22 NOTE — Progress Notes (Signed)
$'@Patient't$  ID: Kelli Horn, female    DOB: 09/03/1955, 67 y.o.   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 No TLC. DLC 70%/17.23   05/2020 HST -AHI 7.7/hour, nadir saturation 84%, 11 minutes with saturation less than 88%    09/22/2022 Follow up: COPD with Asthma overlap Patient presents for a 80-monthfollow-up.  Patient has COPD with asthma overlap.  Recently had a bronchitis over the holidays. Several family members had similar symptoms  She gets short of breath with heavy activity.  She took Amoxicillin (meds from recent dental procedure) Symptoms are improving . Cough and congestion are better.  She remains on Trelegy inhaler daily.  Denies any increased albuterol use. Rarely uses her Albuterol.  Declines vaccines.  Patient is on ACE inhibitor.  Denies daily cough.  Not very active. Has severe PVD with intermittent claudication.     Allergies  Allergen Reactions   Beta Adrenergic Blockers Other (See Comments)    Trigger Asthma   Carvedilol Shortness Of Breath    Reacts with albuterol, worsens asthma symptoms when taken together    Peanut-Containing Drug Products Other (See Comments)     Trigger Asthma attack   Crestor [Rosuvastatin]     myalgias   Iodinated Contrast Media Other (See Comments)    Pt states that she has never had any reaction what so ever to contrast.    Statins Other (See Comments)   Zocor [Simvastatin]     myalgias    Immunization History  Administered Date(s) Administered   PFIZER(Purple  Top)SARS-COV-2 Vaccination 06/10/2020, 07/01/2020    Past Medical History:  Diagnosis Date   Acid reflux    Anxiety    Asthma    Atherosclerosis    Back pain    [facility] [place] [place]   left breast DCIS   Chest pain    Chronic kidney disease    Right side question infarction   Constipation    COPD (chronic obstructive pulmonary disease) (HCC)    Diabetes mellitus without complication (HCC)    no meds   Edema of both lower extremities    GERD (gastroesophageal reflux disease)    Hyperlipidemia    Hypertension    Joint pain    Kidney problem    Obesity    PAD (peripheral artery disease) (HCC)    Peripheral vascular disease (HCC)    severe athrosclerosis   Sleep apnea     no cpap at this time. Sleep study done Jun 20 2020   SOB (shortness of breath)    Swallowing difficulty     Tobacco History: Social History   Tobacco Use  Smoking Status Former   Packs/day: 2.00   Years: 10.00   Total pack years: 20.00   Types: Cigarettes   Quit date: 04/16/2004   Years since quitting: 18.4  Smokeless Tobacco Never   Counseling given: Not Answered   Outpatient Medications Prior to Visit  Medication Sig Dispense Refill   amLODipine (NORVASC) 5 MG tablet TAKE 1 TABLET BY MOUTH EVERY DAY  90 tablet 3   aspirin EC 81 MG tablet Take 81 mg by mouth daily. Swallow whole.     enalapril (VASOTEC) 20 MG tablet Take 1 tablet (20 mg total) by mouth 2 (two) times daily. 180 tablet 1   hydrochlorothiazide (MICROZIDE) 12.5 MG capsule Take 1 capsule (12.5 mg total) by mouth daily. 90 capsule 3   hydrOXYzine (VISTARIL) 25 MG capsule Take 1 capsule (25 mg total) by mouth 4 (four) times daily as needed. 30 capsule 5   loratadine (CLARITIN) 10 MG tablet Take 1 tablet (10 mg total) by mouth daily. 30 tablet 5   metFORMIN (GLUCOPHAGE) 500 MG tablet Take 1 tablet (500 mg total) by mouth daily with breakfast. 90 tablet 3   ondansetron (ZOFRAN) 4 MG tablet Take 1 tablet (4 mg total) by mouth every  8 (eight) hours as needed for nausea or vomiting. 20 tablet 0   Polyethyl Glycol-Propyl Glycol 0.4-0.3 % SOLN Place 1 drop into both eyes 3 (three) times daily as needed (dry eyes).      albuterol (VENTOLIN HFA) 108 (90 Base) MCG/ACT inhaler Inhale 2 puffs into the lungs every 4 (four) hours as needed for wheezing or shortness of breath. 8 g 3   Fluticasone-Umeclidin-Vilant (TRELEGY ELLIPTA) 100-62.5-25 MCG/ACT AEPB INHALE 1 PUFF BY MOUTH EVERY DAY 60 each 11   fluticasone (FLONASE) 50 MCG/ACT nasal spray Place 1 spray into both nostrils daily. (Patient not taking: Reported on 09/22/2022) 16 g 6   No facility-administered medications prior to visit.     Review of Systems:   Constitutional:   No  weight loss, night sweats,  Fevers, chills,  +fatigue, or  lassitude.  HEENT:   No headaches,  Difficulty swallowing,  Tooth/dental problems, or  Sore throat,                No sneezing, itching, ear ache, nasal congestion, post nasal drip,   CV:  No chest pain,  Orthopnea, PND, swelling in lower extremities, anasarca, dizziness, palpitations, syncope.   GI  No heartburn, indigestion, abdominal pain, nausea, vomiting, diarrhea, change in bowel habits, loss of appetite, bloody stools.   Resp:   No chest wall deformity  Skin: no rash or lesions.  GU: no dysuria, change in color of urine, no urgency or frequency.  No flank pain, no hematuria   MS:  No joint pain or swelling.  No decreased range of motion.  No back pain.    Physical Exam  BP (!) 150/90   Pulse 97   Temp 97.8 F (36.6 C) (Oral)   Ht '5\' 10"'$  (1.778 m)   Wt (!) 328 lb 9.6 oz (149.1 kg)   LMP 12/09/2013   SpO2 94%   BMI 47.15 kg/m   GEN: A/Ox3; pleasant , NAD, well nourished    HEENT:  [place]/AT,  NOSE-clear, THROAT-clear, no lesions, no postnasal drip or exudate noted.   NECK:  Supple w/ fair ROM; no JVD; normal carotid impulses w/o bruits; no thyromegaly or nodules palpated; no lymphadenopathy.    RESP  Clear  P & A;  w/o, wheezes/ rales/ or rhonchi. no accessory muscle use, no dullness to percussion  CARD:  RRR, no m/r/g, no peripheral edema, pulses intact, no cyanosis or clubbing.  GI:   Soft & nt; nml bowel sounds; no organomegaly or masses detected.   Musco: Warm bil, no deformities or joint swelling noted.   Neuro: alert, no focal deficits noted.    Skin: Warm, no lesions or rashes  Lab Results:  CBC   BMET   BNP   ProBNP No results found for: "PROBNP"  Imaging: No results found.       Latest Ref Rng & Units 04/24/2020    2:49 PM  PFT Results  FVC-Pre L 2.53   FVC-Predicted Pre % 63   FVC-Post L 2.47   FVC-Predicted Post % 61   Pre FEV1/FVC % % 50   Post FEV1/FCV % % 56   FEV1-Pre L 1.27   FEV1-Predicted Pre % 41   FEV1-Post L 1.37   DLCO uncorrected ml/min/mmHg 17.23   DLCO UNC% % 70   DLVA Predicted % 101     No results found for: "NITRICOXIDE"      Assessment & Plan:   Asthma Moderate persistent asthma with recent exacerbation.  Symptoms seem to be improving with antibiotics..  Will check chest x-ray today.   Trigger prevention.  Continue on Trelegy  Plan  Patient Instructions  Continue on Trelegy 1 puff daily, rinse after use Claritin daily As needed   Albuterol inhaler as needed Activity as tolerated Chest xray today.  Follow-up with Dr. [name] in 6 months and as needed      Rexene Edison, NP 09/22/2022

## 2022-09-22 NOTE — Patient Instructions (Addendum)
Continue on Trelegy 1 puff daily, rinse after use Claritin daily As needed   Albuterol inhaler as needed Activity as tolerated Chest xray today.  Follow-up with Dr. [name] in 6 months and as needed

## 2022-09-24 ENCOUNTER — Other Ambulatory Visit: Payer: Self-pay | Admitting: Family Medicine

## 2022-09-24 ENCOUNTER — Other Ambulatory Visit: Payer: Self-pay

## 2022-09-24 MED ORDER — AMOXICILLIN-POT CLAVULANATE 875-125 MG PO TABS: 1.0000 | ORAL_TABLET | Freq: Two times a day (BID) | ORAL | 0 refills | Status: DC

## 2022-09-24 NOTE — Progress Notes (Signed)
Patient was notified and voiced understanding. Scheduled an appointment for 10/27/22 at 3:30pm and is aware about her f/u cxr.

## 2022-09-24 NOTE — Progress Notes (Deleted)
Placed an order for Augmentin per TP.

## 2022-09-28 ENCOUNTER — Telehealth: Payer: Self-pay | Admitting: Adult Health

## 2022-09-28 DIAGNOSIS — J441 Chronic obstructive pulmonary disease with (acute) exacerbation: Secondary | ICD-10-CM

## 2022-09-30 NOTE — Telephone Encounter (Signed)
Called and spoke to patient and she states that after TP looked at check xray that she was told that TP wanted her to take the Augmentin for 2 weeks.   Looked at results from chest xray and it states Augmentin '875mg'$  twice daily for 7 days  She is asking me to clairify from TP  Please advise Tammy

## 2022-10-01 NOTE — Telephone Encounter (Signed)
Wanted 7 days of therapy to cover pneumonia , if she is still symptomatic can extend for 3 additional 3 days (Augmentin '875mg'$  Twice daily  #6 x 3 days )  Make sure she has ov with cxr in 3-4 weeks to follow up PNA.   Please contact office for sooner follow up if symptoms do not improve or worsen or seek emergency care

## 2022-10-01 NOTE — Telephone Encounter (Signed)
Called and left voicemail for patient to call office back to go over her symptoms.

## 2022-10-07 NOTE — Telephone Encounter (Signed)
Called and spoke with patient. She verbalized understanding. She declined the extra amoxicillin but wanted to move up her appt. I was able to get her scheduled to see TP on 02/20 at 4pm. She is aware that she will need her CXR before OV, order has been placed.    Nothing further needed at time of call.

## 2022-10-08 ENCOUNTER — Ambulatory Visit
Admission: RE | Admit: 2022-10-08 | Discharge: 2022-10-08 | Disposition: A | Discharge status: Home / Self Care | Payer: Medicare Other | Source: Ambulatory Visit | Attending: Family Medicine | Admitting: Family Medicine

## 2022-10-08 ENCOUNTER — Other Ambulatory Visit: Payer: Self-pay | Admitting: Family Medicine

## 2022-10-08 DIAGNOSIS — R928 Other abnormal and inconclusive findings on diagnostic imaging of breast: Secondary | ICD-10-CM | POA: Diagnosis not present

## 2022-10-08 DIAGNOSIS — Z853 Personal history of malignant neoplasm of breast: Secondary | ICD-10-CM | POA: Diagnosis not present

## 2022-10-08 DIAGNOSIS — N6042 Mammary duct ectasia of left breast: Secondary | ICD-10-CM | POA: Diagnosis not present

## 2022-10-08 DIAGNOSIS — N6320 Unspecified lump in the left breast, unspecified quadrant: Secondary | ICD-10-CM

## 2022-10-12 ENCOUNTER — Telehealth: Payer: Self-pay | Admitting: Adult Health

## 2022-10-12 NOTE — Telephone Encounter (Signed)
Called and spoke with pt who states her breathing never really improved since last OV with Tammy 1/23. Pt had a cxr performed after last OV and was told about the results of cxr and took meds as prescribed.   Pt said her cough is better compared to her last OV but said her breathing has not really improved. Pt said she just had a recent mammogram performed which was abnormal as she said it showed some edema and they are wanting to do a biopsy which is currently scheduled 2/20 which is also the same day as her upcoming cxr and follow up with TP.   Since pt is dealing with partially collapsed lungs and with all that is currently going on, pt wants to know what might be able to be recommended for her.  Dr. [name], please advise.

## 2022-10-13 NOTE — Telephone Encounter (Signed)
Called and spoke with the pt and notified of response per TP  She verbalized understanding  Nothing further needed

## 2022-10-13 NOTE — Telephone Encounter (Signed)
Has office visit on February 16 later this week with NP Brittney Mucha, we can check her chest x-ray on this date and further evaluate her symptoms  Please contact office for sooner follow up if symptoms do not improve or worsen or seek emergency care

## 2022-10-13 NOTE — Telephone Encounter (Signed)
Kelli Noel, MD  You; Parrett, Tammy S, NP3 hours ago (8:06 AM)    Please reschedule oV & CXR to different date - she does need follow up CXR    Attempted to call pt but unable to reach. Left message for pt to return call.

## 2022-10-14 ENCOUNTER — Encounter: Payer: Self-pay | Admitting: Family Medicine

## 2022-10-14 ENCOUNTER — Ambulatory Visit (INDEPENDENT_AMBULATORY_CARE_PROVIDER_SITE_OTHER): Payer: Medicare Other | Admitting: Family Medicine

## 2022-10-14 VITALS — BP 154/88 | HR 94 | Temp 98.9°F | Ht 70.0 in | Wt 332.4 lb

## 2022-10-14 DIAGNOSIS — E119 Type 2 diabetes mellitus without complications: Secondary | ICD-10-CM | POA: Diagnosis not present

## 2022-10-14 DIAGNOSIS — I708 Atherosclerosis of other arteries: Secondary | ICD-10-CM

## 2022-10-14 DIAGNOSIS — R928 Other abnormal and inconclusive findings on diagnostic imaging of breast: Secondary | ICD-10-CM | POA: Insufficient documentation

## 2022-10-14 DIAGNOSIS — J9811 Atelectasis: Secondary | ICD-10-CM | POA: Insufficient documentation

## 2022-10-14 DIAGNOSIS — I200 Unstable angina: Secondary | ICD-10-CM

## 2022-10-14 DIAGNOSIS — I10 Essential (primary) hypertension: Secondary | ICD-10-CM | POA: Diagnosis not present

## 2022-10-14 DIAGNOSIS — I700 Atherosclerosis of aorta: Secondary | ICD-10-CM

## 2022-10-14 DIAGNOSIS — J4540 Moderate persistent asthma, uncomplicated: Secondary | ICD-10-CM

## 2022-10-14 DIAGNOSIS — Z6841 Body Mass Index (BMI) 40.0 and over, adult: Secondary | ICD-10-CM

## 2022-10-14 DIAGNOSIS — K8020 Calculus of gallbladder without cholecystitis without obstruction: Secondary | ICD-10-CM | POA: Insufficient documentation

## 2022-10-14 MED ORDER — LOSARTAN POTASSIUM 50 MG PO TABS: 50.0000 mg | ORAL_TABLET | Freq: Every day | ORAL | 3 refills | Status: DC

## 2022-10-14 NOTE — Assessment & Plan Note (Signed)
Ms. Cheesman is no longer taking medication for her diabetes. She is agreeable to restart on her metformin that she has at home.

## 2022-10-14 NOTE — Progress Notes (Signed)
[facility] Kelli Horn [place] [address] Dept: [phone] Dept Fax: [phone]  Chronic Care Office Visit  Subjective:    Patient ID: Kelli Horn, female    DOB: 1956/05/10, 67 y.o..   MRN: [id number]  Chief Complaint  Patient presents with   Medical Management of Chronic Issues    Wants to discuss test results and referrals.    History of Present Illness:  Patient is in today for reassessment of chronic medical issues.  [name] presents today with a number of concerns about her health care. She feels like her issues are not being adequately addressed and is very critical of [facility] and the care she receives there.  [name] was seen at the [facility] on Dec. 16 with generalized abdominal pain. She spent 11 hours int he ED, had multiple blood tests and a CT scan of the abdomen. She notes she was advised she was constipated, to go home and take Miralax and to use Tylenol for pain. She notes she had already been on a laxative for several days prior and had been having small amounts of bowel movements. She was concerned that the CT scan showed she had a galls tone int he neck of the gall bladder, but she was not told about this and that nothing was done about this. Her pain has since resolved.  About a week after her ED visit, [name] came down with a respiratory illness. Her other family members had similar over the next several days. She took amoxicillin related to a recent dental procedure. She was seen about a month later by [name] (pulmonology NP). She had a chest x-ray performed that showed some abnormalities. [name] characterizes this as showing that her lungs were collapsing in both bases. [name] called her in a 7-day course of Augmentin 875 mg bid. She recommended she follow-up in 3-4 weeks for a repeat CXR per the radiologist recommendation. [name] notes that by the time she was  finishing the antibiotics, she was still having cough and not feeling better.  She called [name]'s office and requested additional antibiotics and was eventually given an additional 3-days. [name] feels that there is something still very wrong with her. She doesn't understand why she was not given a longer course of antibiotics (an additional week). She Feels that if she has fluid in her chest they should put a needle in this to drain it.  She has a history of moderate persistent asthma based on prior PFTs. She is currently managed on Trelegy and albuterol.   [name] recently had a mammogram. She notes that they needed to take multiple images of her breasts. She was told by the radiologist that she had edema of her breasts and he asked her whether she had underlying heart failure. [name] notes that she has never had heart failure. She was evaluated byt he cardiologist for this in the past and told she was fine. She has not noted any increased edema in her ankles more recently. She had a past echocardiogram by her cardiologist, who noted this was normal.   [name] has a history of hypertension. She has been prescribed amlodipine 5 mg daily, enalapril 20 mg bid and HCTZ 12.5 mg daily.  She reports she stopped taking amlodipine because she felt it caused swelling of her legs. She stopped taking HCTZ because she felt it worsened dry eyes and dry mouth symptoms.  [name]  has a history of Type 2 diabetes. She also continues to have issues with obesity. She notes that she was previously started on metformin 500 mg daily. She was also tried on Rybelsus. She has stopped both of these medications. She is interested in weight loss, as she sees the impacts of her current weight on her health.  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/2020   Periodontal disease 10/02/2020   Aortic occlusion ([place]) 08/20/2020   Endometrial cancer ([place]) 07/11/2020   Rectal bleeding 06/27/2020   Carotid artery disease ([place]) 05/29/2020   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   PAD (peripheral artery disease) ([place]) 04/23/2020   Upper back pain 03/24/2016   Left shoulder pain 03/24/2016   Hypersomnia with sleep apnea 02/13/2016   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   Morbid obesity with body mass index (BMI) of 45.0 to 49.9 in adult ([place]) 02/13/2016   Circadian rhythm sleep disorder, 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   Weakness of both legs 02/13/2016   Angina syndrome, abdominal ([place]) 02/13/2016   History of progressive weakness 01/10/2016   Gait instability 01/10/2016   Essential hypertension 12/11/2015   Mild persistent asthma 12/11/2015   Normocytic anemia 12/11/2015   Leukocytosis 12/11/2015   Prediabetes 12/11/2015   Unstable angina ([place]) 12/11/2015   Chest pain with moderate risk for cardiac etiology    Dyslipidemia    Past Surgical History:  Procedure Laterality Date   ABDOMINAL AORTOGRAM W/LOWER EXTREMITY Bilateral 08/08/2020   Procedure: ABDOMINAL AORTOGRAM W/LOWER EXTREMITY;  Surgeon: [name], MD;  Location: [facility];  Service: Cardiovascular;  Laterality: Bilateral;   APPENDECTOMY     BIOPSY  06/27/2020   Procedure: BIOPSY;  Surgeon: [name], MD;  Location: [facility];  Service: Endoscopy;;   BREAST LUMPECTOMY WITH RADIOACTIVE SEED AND SENTINEL LYMPH NODE BIOPSY Bilateral 06/07/2020   Procedure: BILATERAL BREAST LUMPECTOMY WITH RADIOACTIVE SEED AND LEFT SENTINEL LYMPH NODE BIOPSY;  Surgeon: [name], MD;  Location: [facility];  Service: General;   Laterality: Bilateral;  PEC BLOCK   COLONOSCOPY WITH PROPOFOL N/A 06/27/2020   Procedure: COLONOSCOPY WITH PROPOFOL;  Surgeon: [name], MD;  Location: [facility];  Service: Endoscopy;  Laterality: N/A;   LEFT HEART CATH AND CORONARY ANGIOGRAPHY N/A 04/23/2020   Procedure: LEFT HEART CATH AND CORONARY ANGIOGRAPHY;  Surgeon: Martinique, Peter M, MD;  Location: [facility];  Service: Cardiovascular;  Laterality: N/A;   POLYPECTOMY  06/27/2020   Procedure: POLYPECTOMY;  Surgeon: [name], MD;  [facility];  Service: Endoscopy;;   ROBOTIC ASSISTED TOTAL HYSTERECTOMY WITH BILATERAL SALPINGO OOPHERECTOMY N/A 07/11/2020   Procedure: XI ROBOTIC ASSISTED TOTAL HYSTERECTOMY WITH BILATERAL SALPINGO OOPHORECTOMY;  Surgeon: [name], MD;  Location: [facility];  Service: Gynecology;  Laterality: N/A;   Family History  Problem Relation Age of Onset   Lung cancer Mother    Schizophrenia Mother    Heart attack Father 68   Aneurysm Father    Diabetes Father    Hypertension Father    High Cholesterol Father    Stroke Father    Obesity Father  AAA (abdominal aortic aneurysm) Father    Breast cancer Maternal Aunt    Heart disease Paternal Uncle    Aneurysm Paternal Uncle    Diabetes Other    Colon cancer Neg Hx    Cancer - Ovarian Neg Hx    Endometrial cancer Neg Hx    Pancreatic cancer Neg Hx    Prostate cancer Neg Hx    Outpatient Medications Prior to Visit  Medication Sig Dispense Refill   albuterol (VENTOLIN HFA) 108 (90 Base) MCG/ACT inhaler Inhale 2 puffs into the lungs every 4 (four) hours as needed for wheezing or shortness of breath. 8 g 3   aspirin EC 81 MG tablet Take 81 mg by mouth daily. Swallow whole.     Fluticasone-Umeclidin-Vilant (TRELEGY ELLIPTA) 100-62.5-25 MCG/ACT AEPB Inhale 1 puff into the lungs daily. 1 each 5   hydrOXYzine (VISTARIL) 25 MG capsule Take 1 capsule (25 mg total) by mouth 4 (four) times daily as needed. 30 capsule 5   loratadine  (CLARITIN) 10 MG tablet Take 1 tablet (10 mg total) by mouth daily. 30 tablet 5   metFORMIN (GLUCOPHAGE) 500 MG tablet Take 1 tablet (500 mg total) by mouth daily with breakfast. 90 tablet 3   ondansetron (ZOFRAN) 4 MG tablet Take 1 tablet (4 mg total) by mouth every 8 (eight) hours as needed for nausea or vomiting. 20 tablet 0   Polyethyl Glycol-Propyl Glycol 0.4-0.3 % SOLN Place 1 drop into both eyes 3 (three) times daily as needed (dry eyes).      amLODipine (NORVASC) 5 MG tablet TAKE 1 TABLET BY MOUTH EVERY DAY 90 tablet 3   enalapril (VASOTEC) 20 MG tablet Take 1 tablet (20 mg total) by mouth 2 (two) times daily. 180 tablet 1   fluticasone (FLONASE) 50 MCG/ACT nasal spray Place 1 spray into both nostrils daily. (Patient not taking: Reported on 10/14/2022) 16 g 6   hydrochlorothiazide (MICROZIDE) 12.5 MG capsule TAKE 1 CAPSULE BY MOUTH EVERY DAY (Patient not taking: Reported on 10/14/2022) 90 capsule 3   amoxicillin-clavulanate (AUGMENTIN) 875-125 MG tablet Take 1 tablet by mouth 2 (two) times daily. 14 tablet 0   No facility-administered medications prior to visit.   Allergies  Allergen Reactions   Beta Adrenergic Blockers Other (See Comments)    Trigger Asthma   Carvedilol Shortness Of Breath    Reacts with albuterol, worsens asthma symptoms when taken together    Peanut-Containing Drug Products Other (See Comments)     Trigger Asthma attack   Crestor [Rosuvastatin]     myalgias   Iodinated Contrast Media Other (See Comments)    Pt states that she has never had any reaction what so ever to contrast.    Statins Other (See Comments)   Zocor [Simvastatin]     myalgias   Objective:   Today's Vitals   10/14/22 0756  BP: (!) 152/86  Pulse: 94  Temp: 98.9 F (37.2 C)  TempSrc: Temporal  SpO2: 95%  Weight: (!) 332 lb 6.4 oz (150.8 kg)  Height: 5' 10"$  (1.778 m)   Body mass index is 47.69 kg/m.   General: Well developed, well nourished. No acute distress. Lungs: Clear to  auscultation bilaterally. No wheezing, rales or rhonchi. CV: RRR without murmurs or rubs. Pulses 2+ bilaterally. Psych: Alert and oriented. Normal mood and affect.  Health Maintenance Due  Topic Date Due   Medicare Annual Wellness (AWV)  Never done   FOOT EXAM  Never done   HIV Screening  Never  done   Diabetic kidney evaluation - Urine ACR  Never done   Hepatitis C Screening  Never done   DTaP/Tdap/Td (1 - Tdap) Never done   Zoster Vaccines- Shingrix (1 of 2) Never done   PAP SMEAR-Modifier  Never done   Pneumonia Vaccine 76+ Years old (1 of 1 - PCV) Never done   DEXA SCAN  Never done   Imaging: CT Abdomen and Pelvis wo contrast (08/16/2023) FINDINGS: Lower chest: No acute abnormality.   Hepatobiliary: Stone within the neck of the gallbladder. No evidence of cholecystitis. No biliary dilation. No suspicious liver lesion.   Pancreas: Unremarkable. No pancreatic ductal dilatation or surrounding inflammatory changes.   Spleen: Normal in size without focal abnormality.   Adrenals/Urinary Tract: Unremarkable adrenal glands. Cortical renal scarring inferior pole right kidney. No urinary calculi or hydronephrosis. Unremarkable bladder.   Stomach/Bowel: Normal caliber large and small bowel. Moderate colonic stool load greatest in the right colon. No bowel wall thickening or pericolonic edema. Unremarkable stomach.   Vascular/Lymphatic: Advanced aorto bi-iliac atherosclerotic calcification. Ectasia of the infrarenal abdominal aortic and measuring 2.4 cm. No enlarged abdominopelvic lymph nodes.   Reproductive: Unremarkable.   Other: No free intraperitoneal fluid or air.   Musculoskeletal: No acute osseous abnormality.   IMPRESSION: No acute abnormality in the abdomen or pelvis.   Moderate colonic stool load greatest in the right colon.   Cholelithiasis.   Advanced aorto bi-iliac athero sclerotic calcification.  Mammogram and left Breast Ultrasound  (10/08/2022) IMPRESSION: 1. Solitary mild duct ectasia containing internal material at the 3 o'clock position of the LEFT breast extending 1-5 cm from the nipple. 2. Skin thickening diffusely in the periareolar LEFT breast. 3. No pathologic LEFT axillary lymphadenopathy. 4. No mammographic evidence of malignancy involving the RIGHT breast.   RECOMMENDATION: Ultrasound-guided core needle biopsy of the solitary dilated duct containing internal material at the 3 o'clock location of the LEFT breast.   I have discussed the findings and recommendations with the patient. The ultrasound core needle biopsy procedure was discussed with the patient and her questions were answered. She wishes to proceed with the biopsy which has been scheduled by the Breast Imaging staff.   BI-RADS CATEGORY  4: Suspicious.   Echocardiogram (05/13/2020) IMPRESSIONS   1. Left ventricular ejection fraction, by estimation, is 55 to 60%. The left ventricle has normal function. The left ventricle has no regional wall motion abnormalities. There is mild concentric left ventricular hypertrophy. Left ventricular diastolic parameters are indeterminate. Elevated left ventricular end-diastolic pressure.   2. Right ventricular systolic function is normal. The right ventricular size is normal. Tricuspid regurgitation signal is inadequate for assessing PA pressure.   3. Left atrial size was mildly dilated.   4. Right atrial size was mildly dilated.   5. The mitral valve is normal in structure. Trivial mitral valve regurgitation. No evidence of mitral stenosis. Moderate mitral annular calcification.   6. The aortic valve is tricuspid. There is mild calcification of the aortic valve. There is mild thickening of the aortic valve. Aortic valve regurgitation is not visualized. Mild aortic valve sclerosis is present, with no evidence of aortic valve stenosis.   7. The inferior vena cava is normal in size with greater than 50% respiratory  variability, suggesting right atrial pressure of 3 mmHg.   Lab Results    Latest Ref Rng & Units 08/15/2022    8:56 PM 08/02/2020    4:35 PM 07/05/2020   12:22 PM  CBC  WBC 4.0 - 10.5  K/uL 13.3  13.8  9.5   Hemoglobin 12.0 - 15.0 g/dL 13.9  13.0  13.3   Hematocrit 36.0 - 46.0 % 44.8  39.3  41.5   Platelets 150 - 400 K/uL 476  466  389        Latest Ref Rng & Units 08/15/2022    8:56 PM 08/03/2022    1:49 PM 11/05/2020   10:18 AM  CMP  Glucose 70 - 99 mg/dL 119  120  120   BUN 8 - 23 mg/dL 11   13   Creatinine 0.44 - 1.00 mg/dL 0.93   0.85   Sodium 135 - 145 mmol/L 137   140   Potassium 3.5 - 5.1 mmol/L 4.3   4.8   Chloride 98 - 111 mmol/L 104   99   CO2 22 - 32 mmol/L 23   22   Calcium 8.9 - 10.3 mg/dL 9.2   9.3   Total Protein 6.5 - 8.1 g/dL 8.4   7.1   Total Bilirubin 0.3 - 1.2 mg/dL 0.7   0.2   Alkaline Phos 38 - 126 U/L 80   102   AST 15 - 41 U/L 30   15   ALT 0 - 44 U/L 30   15    Last hemoglobin A1c Lab Results  Component Value Date   HGBA1C 7.2 (H) 08/03/2022   0 Result Notes      Component Ref Range & Units 2 mo ago 6 yr ago  B Natriuretic Peptide 0.0 - 100.0 pg/mL 234.0 High  260.0 High      Assessment & Plan:   Problem List Items Addressed This Visit       Cardiovascular and Mediastinum   Essential hypertension    Ms. Guzy blood pressure is elevated today. She has stopped her amlodipine and HCTZ. The amlodipine could have been contributing to her leg swelling. It is unclear of the low dose of HCTZ was really an issue. In either case, I will have her stop her enalapril and switch her to losartan. I would like for her to comeback in 1 month for reassessment.      Relevant Medications   losartan (COZAAR) 50 MG tablet   Unstable angina [facility])    It is unclear if Ms. Gauthier may have some underlying heart failure developing. She has had a persistently elevated BNP. She did have some mild LVH and dilation of both atria 5 years ago. I will refer to [facility], as she has requested.      Relevant Medications   losartan (COZAAR) 50 MG tablet   Other Relevant Orders   Ambulatory referral to Cardiology   Atherosclerosis of aortic bifurcation and common iliac arteries [facility])    Ms. Rozsa has a history of severe aorto-illiac atherosclerosis. She notes that vascular had previosuly noted concern for her appropriateness for surgery based on her weight. Ms. Baldy would like to see a vascular surgeon with [facility] to review for other options.      Relevant Medications   losartan (COZAAR) 50 MG tablet   Other Relevant Orders   Ambulatory referral to Vascular Surgery     Respiratory   Moderate persistent asthma    I reviewed prior PFT findings. This does show some mixed picture of reversible airway obstruction and some restrictive pattern. I do recommend she continue her Trelegy and PRN albuterol. I will refer her to pulmonlogy with [facility], as  she requests.      Relevant Orders   Ambulatory referral to Pulmonology   Atelectasis    I reviewed the chest x-ray. Ms. Cedano has a small area of atelectasis. I explained that this is a small area of segmental air cell collapse, but would be inappropriate to characterize as her "lungs collapsing". I pointed out that there was no reported pneumothorax or pleural effusion. She does plan to keep her appointment with her current pulmonology practice for a repeat chest x-ray. If the findings persist, she will be referred for a CT scan.        Digestive   Cholelithiasis - Primary    I reviewed the CT scan with her, as well as her lab results. There was no CT evidence of cholelithiasis and no elevation of liver or pancreatic enzymes. I explained that gallstones can be present and cause intermittent or no symptoms.         Endocrine   Type 2 diabetes mellitus without complication, without long-term current use of insulin [facility])    Ms. Sweat is no longer  taking medication for her diabetes. She is agreeable to restart on her metformin that she has at home.      Relevant Medications   losartan (COZAAR) 50 MG tablet     Other   Morbid obesity with body mass index (BMI) of 45.0 to 49.9 in adult [facility])    Ms. Macnamara weight is having a major impact on multiple of her current health issues. I do think that use of a GLP-1 RA could be helpful for her as an adjunct. We will have her restart the metformin first. At follow-up, we will discuss further about potentially starting a GLP1 agent.      Abnormal mammogram of left breast    She will follow through with radiology regarding plans to biopsy her left breast.      I spent 100 minutes on this encounter, taking a detailed history of patient concerns, performing a focused physical exam, reviewing past lab and imaging study results, reviewing past consult notes, developing a plan and documenting the encounter.  Return in about 4 weeks (around 11/11/2022) for Reassessment.   [name], MD

## 2022-10-14 NOTE — Assessment & Plan Note (Signed)
Kelli Horn weight is having a major impact on multiple of her current health issues. I do think that use of a GLP-1 RA could be helpful for her as an adjunct. We will have her restart the metformin first. At follow-up, we will discuss further about potentially starting a GLP1 agent.

## 2022-10-14 NOTE — Assessment & Plan Note (Signed)
It is unclear if Ms. Hishmeh may have some underlying heart failure developing. She has had a persistently elevated BNP. She did have some mild LVH and dilation of both atria 5 years ago. I will refer to [place], as she has requested.

## 2022-10-14 NOTE — Assessment & Plan Note (Signed)
I reviewed prior PFT findings. This does show some mixed picture of reversible airway obstruction and some restrictive pattern. I do recommend she continue her Trelegy and PRN albuterol. I will refer her to pulmonlogy with [place], as she requests.

## 2022-10-14 NOTE — Assessment & Plan Note (Signed)
She will follow through with radiology regarding plans to biopsy her left breast.

## 2022-10-14 NOTE — Assessment & Plan Note (Signed)
Kelli Horn blood pressure is elevated today. She has stopped her amlodipine and HCTZ. The amlodipine could have been contributing to her leg swelling. It is unclear of the low dose of HCTZ was really an issue. In either case, I will have her stop her enalapril and switch her to losartan. I would like for her to comeback in 1 month for reassessment.

## 2022-10-14 NOTE — Assessment & Plan Note (Signed)
I reviewed the CT scan with her, as well as her lab results. There was no CT evidence of cholelithiasis and no elevation of liver or pancreatic enzymes. I explained that gallstones can be present and cause intermittent or no symptoms.

## 2022-10-14 NOTE — Assessment & Plan Note (Signed)
I reviewed the chest x-ray. Kelli Horn has a small area of atelectasis. I explained that this is a small area of segmental air cell collapse, but would be inappropriate to characterize as her "lungs collapsing". I pointed out that there was no reported pneumothorax or pleural effusion. She does plan to keep her appointment with her current pulmonology practice for a repeat chest x-ray. If the findings persist, she will be referred for a CT scan.

## 2022-10-14 NOTE — Assessment & Plan Note (Signed)
Ms. Scallon has a history of severe aorto-illiac atherosclerosis. She notes that vascular had previosuly noted concern for her appropriateness for surgery based on her weight. Ms. Radaker would like to see a vascular surgeon with [facility] to review for other options.

## 2022-10-16 ENCOUNTER — Ambulatory Visit (INDEPENDENT_AMBULATORY_CARE_PROVIDER_SITE_OTHER): Payer: Medicare Other

## 2022-10-16 ENCOUNTER — Ambulatory Visit (INDEPENDENT_AMBULATORY_CARE_PROVIDER_SITE_OTHER): Payer: Medicare Other | Admitting: Adult Health

## 2022-10-16 ENCOUNTER — Encounter: Payer: Self-pay | Admitting: Adult Health

## 2022-10-16 ENCOUNTER — Other Ambulatory Visit: Payer: Self-pay | Admitting: *Deleted

## 2022-10-16 VITALS — BP 128/80 | HR 98 | Temp 97.6°F | Ht 70.0 in | Wt 332.0 lb

## 2022-10-16 DIAGNOSIS — J441 Chronic obstructive pulmonary disease with (acute) exacerbation: Secondary | ICD-10-CM

## 2022-10-16 DIAGNOSIS — J4489 Other specified chronic obstructive pulmonary disease: Secondary | ICD-10-CM | POA: Diagnosis not present

## 2022-10-16 DIAGNOSIS — R9389 Abnormal findings on diagnostic imaging of other specified body structures: Secondary | ICD-10-CM | POA: Diagnosis not present

## 2022-10-16 DIAGNOSIS — F411 Generalized anxiety disorder: Secondary | ICD-10-CM | POA: Diagnosis not present

## 2022-10-16 LAB — CBC WITH DIFFERENTIAL/PLATELET
Basophils Absolute: 0.1 10*3/uL (ref 0.0–0.1)
Basophils Relative: 0.7 % (ref 0.0–3.0)
Eosinophils Absolute: 0.8 10*3/uL — ABNORMAL HIGH (ref 0.0–0.7)
Eosinophils Relative: 7.2 % — ABNORMAL HIGH (ref 0.0–5.0)
HCT: 39.4 % (ref 36.0–46.0)
Hemoglobin: 13.2 g/dL (ref 12.0–15.0)
Lymphocytes Relative: 25.9 % (ref 12.0–46.0)
Lymphs Abs: 3 10*3/uL (ref 0.7–4.0)
MCHC: 33.5 g/dL (ref 30.0–36.0)
MCV: 80.7 fl (ref 78.0–100.0)
Monocytes Absolute: 1.1 10*3/uL — ABNORMAL HIGH (ref 0.1–1.0)
Monocytes Relative: 9.5 % (ref 3.0–12.0)
Neutro Abs: 6.5 10*3/uL (ref 1.4–7.7)
Neutrophils Relative %: 56.7 % (ref 43.0–77.0)
Platelets: 441 10*3/uL — ABNORMAL HIGH (ref 150.0–400.0)
RBC: 4.88 Mil/uL (ref 3.87–5.11)
RDW: 15.7 % — ABNORMAL HIGH (ref 11.5–15.5)
WBC: 11.5 10*3/uL — ABNORMAL HIGH (ref 4.0–10.5)

## 2022-10-16 LAB — BASIC METABOLIC PANEL
BUN: 17 mg/dL (ref 6–23)
CO2: 27 mEq/L (ref 19–32)
Calcium: 10.1 mg/dL (ref 8.4–10.5)
Chloride: 100 mEq/L (ref 96–112)
Creatinine, Ser: 1.12 mg/dL (ref 0.40–1.20)
GFR: 51.54 mL/min — ABNORMAL LOW (ref >60.00)
Glucose, Bld: 124 mg/dL — ABNORMAL HIGH (ref 70–99)
Potassium: 4.3 mEq/L (ref 3.5–5.1)
Sodium: 136 mEq/L (ref 135–145)

## 2022-10-16 LAB — D-DIMER, QUANTITATIVE: D-Dimer, Quant: 0.38 mcg/mL FEU (ref <0.50)

## 2022-10-16 MED ORDER — ALBUTEROL SULFATE (2.5 MG/3ML) 0.083% IN NEBU: 2.5000 mg | INHALATION_SOLUTION | Freq: Four times a day (QID) | RESPIRATORY_TRACT | 5 refills | Status: DC | PRN

## 2022-10-16 MED ORDER — BENZONATATE 200 MG PO CAPS: 200.0000 mg | ORAL_CAPSULE | Freq: Three times a day (TID) | ORAL | 1 refills | Status: DC | PRN

## 2022-10-16 MED ORDER — PREDNISONE 10 MG PO TABS: ORAL_TABLET | ORAL | 0 refills | Status: DC

## 2022-10-16 MED ORDER — ALPRAZOLAM 0.25 MG PO TABS: 0.2500 mg | ORAL_TABLET | Freq: Three times a day (TID) | ORAL | 0 refills | Status: DC | PRN

## 2022-10-16 NOTE — Assessment & Plan Note (Signed)
Slow to resolve COPD/asthmatic bronchitic exacerbation plus or minus right lower lobe pneumonia.  Patient's completed 7-day course of antibiotics.  Will treat with a course of prednisone.  Control for triggers such as postnasal drainage and reflux.  Cough control regimen.  Will check D-dimer.  If D-dimer is positive.  Will order a CT angio chest.  With PE protocol  Plan  Patient Instructions  Change Claritin to Zyrtec 7m At bedtime   Begin Prilosec 27mdaily  Begin Pepcid 2055mt bedtime   Continue on Trelegy 1 puff daily , rinse after use .  Albuterol inhaler or neb As needed   Delsym 2 tsp Twice daily  for cough As needed   Tessalon Three times a day  As needed  cough  Prednisone taper over next week  Labs today  Set up CT chest  Xanax 0.28m38m needed  day of CT scan for anxiety . (Do not take with other sedating medications)  Follow up with Dr. AlvaElsworth Soho 4-6 weeks and As needed   Please contact office for sooner follow up if symptoms do not improve or worsen or seek emergency care

## 2022-10-16 NOTE — Patient Instructions (Addendum)
Change Claritin to Zyrtec 48m At bedtime   Begin Prilosec 246mdaily  Begin Pepcid 2090mt bedtime   Continue on Trelegy 1 puff daily , rinse after use .  Albuterol inhaler or neb As needed   Delsym 2 tsp Twice daily  for cough As needed   Tessalon Three times a day  As needed  cough  Prednisone taper over next week  Labs today  Set up CT chest  Xanax 0.44m84m needed  day of CT scan for anxiety . (Do not take with other sedating medications)  Follow up with Dr. AlvaElsworth Soho 4-6 weeks and As needed   Please contact office for sooner follow up if symptoms do not improve or worsen or seek emergency care

## 2022-10-16 NOTE — Assessment & Plan Note (Signed)
Right lower bandlike scarring on chest x-ray.  Patient has a history of smoking.  Will check CT chest for further evaluation.

## 2022-10-16 NOTE — Assessment & Plan Note (Signed)
Complains of severe anxiety with scans-will give Xanax 0.25 mg to use daily on exam.  Patient advised of sedating effect and to use with caution.  Patient aware that she will have someone drive her to the scan.

## 2022-10-16 NOTE — Progress Notes (Signed)
$@Patients$  ID: Kelli Horn, female    DOB: [birth year], 67 y.o.   MRN: [id number]  Chief Complaint  Patient presents with   Follow-up    Referring provider: [name], MD  HPI: 67 year old female former smoker followed for COPD with asthma overlap Medical history significant for sleep apnea-CPAP intolerant, history of breast cancer, endometrial cancer, severe peripheral vascular disease, diabetes  TEST/EVENTS :  05/2020 Eos 300   02/2022 spirometry shows a ratio of 70, FEV1 54%, FVC 59%    03/2020 PFT: FVC 61%, FEV1 44%, ratio 56, 8% change in FEV1 post. No TLC. DLC 70%/17.23   05/2020 HST -AHI 7.7/hour, nadir saturation 84%, 11 minutes with saturation less than 88%  10/16/2022 Follow up: COPD with asthma overlap, Pneumonia  Patient returns for a 3-week follow-up.  Patient was seen last visit and treated COPD exacerbation and right lower lobe pneumonia.  She had a viral URI  over the holidays with cough and congestion.  Chest x-ray done on September 22, 2022 showed a masslike nodular consolidation in the right lower lobe.  She was treated with a 7-day course of antibiotics.  She remains on Trelegy inhaler daily. Says got better partially but symptoms worse last couple of weeks. Severe barking cough at time, wheezing and shortness of breath. Gets winded with activities.  Changed from Ace Inhibitor earlier this month to Losartan by PCP . Chest xray today shows stable bandlike scarring right base. No fever or discolored mucus. Has post nasal drip and intermittent reflux. We discussed getting a CT scan for further evaluation.  Patient says that she will need something for anxiety as she has severe claustrophobia with scans.  Under a lot of stress . Had Abnormal mammogram 10/08/22, going for biopsy next week.  Previous breast cancer Left s/p lumpectomy 2021  . No XRT or Chemo. Very nervous    Allergies  Allergen Reactions   Beta Adrenergic Blockers Other (See Comments)    Trigger  Asthma   Carvedilol Shortness Of Breath    Reacts with albuterol, worsens asthma symptoms when taken together    Peanut-Containing Drug Products Other (See Comments)     Trigger Asthma attack   Crestor [Rosuvastatin]     myalgias   Iodinated Contrast Media Other (See Comments)    Pt states that she has never had any reaction what so ever to contrast.    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 Sleep study done Jun 20 2020  SOB (shortness of breath)    Swallowing difficulty     Tobacco History: Social History   Tobacco Use  Smoking Status Former   Packs/day: 2.00   Years: 10.00   Total pack years: 20.00   Types: Cigarettes   Quit date: 04/16/2004   Years since quitting: 18.5  Smokeless Tobacco Never   Counseling given: Not Answered   Outpatient Medications Prior to Visit  Medication Sig Dispense Refill   albuterol (VENTOLIN HFA) 108 (90 Base) MCG/ACT inhaler Inhale 2 puffs into the lungs every 4 (four) hours as needed for wheezing or shortness of breath. 8 g 3   aspirin EC 81 MG tablet Take 81 mg by mouth daily. Swallow whole.     fluticasone (FLONASE) 50  MCG/ACT nasal spray Place 1 spray into both nostrils daily. 16 g 6   Fluticasone-Umeclidin-Vilant (TRELEGY ELLIPTA) 100-62.5-25 MCG/ACT AEPB Inhale 1 puff into the lungs daily. 1 each 5   hydrochlorothiazide (MICROZIDE) 12.5 MG capsule TAKE 1 CAPSULE BY MOUTH EVERY DAY 90 capsule 3   hydrOXYzine (VISTARIL) 25 MG capsule Take 1 capsule (25 mg total) by mouth 4 (four) times daily as needed. 30 capsule 5   loratadine (CLARITIN) 10 MG tablet Take 1 tablet (10 mg total) by mouth daily. 30 tablet 5   losartan (COZAAR) 50 MG tablet Take 1 tablet (50 mg total) by mouth daily. 90 tablet 3   metFORMIN (GLUCOPHAGE) 500 MG tablet Take 1 tablet (500 mg total) by mouth daily with breakfast. 90 tablet 3   ondansetron (ZOFRAN) 4 MG tablet Take 1 tablet (4 mg total) by mouth every 8 (eight) hours as needed for nausea or vomiting. 20 tablet 0   Polyethyl Glycol-Propyl Glycol 0.4-0.3 % SOLN Place 1 drop into both eyes 3 (three) times daily as needed (dry eyes).      No facility-administered medications prior to visit.     Review of Systems:   Constitutional:   No  weight loss, night sweats,  Fevers, chills,  +fatigue, or  lassitude.  HEENT:   No headaches,  Difficulty swallowing,  Tooth/dental problems, or  Sore throat,                No sneezing, itching, ear ache,  +nasal congestion, post nasal drip,   CV:  No chest pain,  Orthopnea, PND, swelling in lower extremities, anasarca, dizziness, palpitations, syncope.   GI  No abdominal pain, nausea, vomiting, diarrhea, change in bowel habits, loss of appetite, bloody stools.   Resp:   No chest wall deformity  Skin: no rash or lesions.  GU: no dysuria, change in color of urine, no urgency or frequency.  No flank pain, no hematuria   MS:  No joint pain or swelling.  No decreased range of motion.  No back pain.    Physical Exam  BP 128/80 (BP Location: Right Arm, Patient Position: Sitting, Cuff Size: Large)   Pulse 98   Temp 97.6 F (36.4 C) (Oral)    Ht 5' 10"$  (1.778 m)   Wt (!) 332 lb (150.6 kg)   LMP 12/09/2013   SpO2 97% Comment: RA  BMI 47.64 kg/m   GEN: A/Ox3; pleasant , NAD, well nourished    HEENT:  [place]/AT,  NOSE-clear, THROAT-clear, no lesions, no postnasal drip or exudate noted.  No stridor  NECK:  Supple w/ fair ROM; no JVD; normal carotid impulses w/o bruits; no thyromegaly or nodules palpated; no lymphadenopathy.    RESP some upper airway pseudo wheezing with  forced expiration . no accessory muscle use, no dullness to percussion  CARD:  RRR, no m/r/g, no peripheral edema, pulses intact, no cyanosis or clubbing.  GI:   Soft & nt; nml bowel sounds; no organomegaly or masses detected.   Musco: Warm bil, no deformities or joint swelling noted.   Neuro: alert, no focal deficits noted.    Skin: Warm, no lesions or rashes    Lab Results:       ProBNP No results found for: "PROBNP"  Imaging: DG Chest 2 View  Result Date: 10/16/2022 CLINICAL DATA:  Follow-up pneumonia EXAM: CHEST - 2 VIEW COMPARISON:  09/22/2022 chest radiograph. FINDINGS: Stable cardiomediastinal silhouette with normal heart size. No pneumothorax. No pleural effusion. No pulmonary edema. No acute consolidative airspace disease. Stable bandlike opacity along the lower right heart border, compatible with bandlike scarring seen on 08/15/2022 CT abdomen study. IMPRESSION: No active cardiopulmonary disease. Stable bandlike scarring along the lower right heart border. Electronically Signed   By: Ilona Sorrel M.D.   On: 10/16/2022 08:34   MM DIAG BREAST TOMO BILATERAL  Result Date: 10/08/2022 CLINICAL DATA:  67 year old with a personal history of malignant LEFT breast lumpectomy in 2021 and benign RIGHT breast excisional biopsy at that time. The patient declined adjuvant therapy. She presents now with palpable thickening involving the inner LEFT breast. Annual evaluation, RIGHT breast. EXAM: DIGITAL DIAGNOSTIC BILATERAL MAMMOGRAM WITH TOMOSYNTHESIS;  ULTRASOUND LEFT BREAST LIMITED TECHNIQUE: Bilateral digital diagnostic mammography and breast tomosynthesis was performed.; Targeted ultrasound examination of the left breast was performed. COMPARISON:  Previous exam(s). ACR Breast Density Category b: There are scattered areas of fibroglandular density. FINDINGS: Full field CC and MLO views of both breasts and a spot compression tangential view of the palpable concern in the LEFT breast were obtained. RIGHT: No findings suspicious for malignancy. LEFT: Skin thickening diffusely in the periareolar location. Developing asymmetry in the inner breast at anterior depth, subareolar location, and outer breast at anterior depth compared to prior mammograms. Scar/distortion at the lumpectomy site in the outer breast at posterior depth. Targeted ultrasound is performed, demonstrating skin thickening throughout the inner breast, most pronounced at the 3 o'clock location. At 3 o'clock extending 1-5 cm from the nipple is a mildly dilated duct containing internal hypoechoic material; there is no internal power Doppler flow within this hypoechoic material the remaining ducts in the inner breast are normal in appearance. Sonographic evaluation of the LEFT axilla demonstrates no pathologic lymphadenopathy. On correlative physical examination, there is no skin erythema in the periareolar location where there is skin thickening. There is no palpable mass in the periareolar location. IMPRESSION: 1. Solitary mild duct ectasia containing internal material at the 3 o'clock position of the LEFT breast extending 1-5 cm from the nipple. 2. Skin thickening diffusely in the periareolar LEFT breast. 3. No pathologic LEFT axillary lymphadenopathy. 4. No mammographic evidence of malignancy involving the RIGHT breast. RECOMMENDATION: Ultrasound-guided core needle biopsy of the solitary dilated duct containing internal material at the 3 o'clock location of the LEFT breast. I have discussed the  findings and recommendations with the patient. The ultrasound core needle biopsy procedure was discussed with the patient and her questions were answered. She wishes to proceed with the biopsy which has been scheduled by the Breast Imaging staff. BI-RADS CATEGORY  4: Suspicious. Electronically Signed   By: Evangeline Dakin M.D.   On: 10/08/2022 16:29  US BREAST LTD UNI LEFT INC AXILLA  Result Date: 10/08/2022 CLINICAL DATA:  67 year old with a  personal history of malignant LEFT breast lumpectomy in 2021 and benign RIGHT breast excisional biopsy at that time. The patient declined adjuvant therapy. She presents now with palpable thickening involving the inner LEFT breast. Annual evaluation, RIGHT breast. EXAM: DIGITAL DIAGNOSTIC BILATERAL MAMMOGRAM WITH TOMOSYNTHESIS; ULTRASOUND LEFT BREAST LIMITED TECHNIQUE: Bilateral digital diagnostic mammography and breast tomosynthesis was performed.; Targeted ultrasound examination of the left breast was performed. COMPARISON:  Previous exam(s). ACR Breast Density Category b: There are scattered areas of fibroglandular density. FINDINGS: Full field CC and MLO views of both breasts and a spot compression tangential view of the palpable concern in the LEFT breast were obtained. RIGHT: No findings suspicious for malignancy. LEFT: Skin thickening diffusely in the periareolar location. Developing asymmetry in the inner breast at anterior depth, subareolar location, and outer breast at anterior depth compared to prior mammograms. Scar/distortion at the lumpectomy site in the outer breast at posterior depth. Targeted ultrasound is performed, demonstrating skin thickening throughout the inner breast, most pronounced at the 3 o'clock location. At 3 o'clock extending 1-5 cm from the nipple is a mildly dilated duct containing internal hypoechoic material; there is no internal power Doppler flow within this hypoechoic material the remaining ducts in the inner breast are normal in  appearance. Sonographic evaluation of the LEFT axilla demonstrates no pathologic lymphadenopathy. On correlative physical examination, there is no skin erythema in the periareolar location where there is skin thickening. There is no palpable mass in the periareolar location. IMPRESSION: 1. Solitary mild duct ectasia containing internal material at the 3 o'clock position of the LEFT breast extending 1-5 cm from the nipple. 2. Skin thickening diffusely in the periareolar LEFT breast. 3. No pathologic LEFT axillary lymphadenopathy. 4. No mammographic evidence of malignancy involving the RIGHT breast. RECOMMENDATION: Ultrasound-guided core needle biopsy of the solitary dilated duct containing internal material at the 3 o'clock location of the LEFT breast. I have discussed the findings and recommendations with the patient. The ultrasound core needle biopsy procedure was discussed with the patient and her questions were answered. She wishes to proceed with the biopsy which has been scheduled by the Breast Imaging staff. BI-RADS CATEGORY  4: Suspicious. Electronically Signed   By: Evangeline Dakin M.D.   On: 10/08/2022 16:29  DG Chest 2 View  Result Date: 09/24/2022 CLINICAL DATA:  Cough.  COPD. EXAM: CHEST - 2 VIEW COMPARISON:  Chest radiograph 02/21/2020 FINDINGS: Stable cardiac and mediastinal contours. Bibasilar heterogeneous opacities. Masslike nodular consolidation within the right lower hemithorax. No pleural effusion or pneumothorax. Thoracic spine degenerative changes. IMPRESSION: 1. Masslike nodular consolidation within the right lower hemithorax. Findings may represent infection or atelectasis. Recommend short-term follow-up PA and lateral chest radiograph. If findings persist, recommend further evaluation with chest CT. 2. Bibasilar atelectasis. Electronically Signed   By: Lovey Newcomer M.D.   On: 09/24/2022 13:51         Latest Ref Rng & Units 04/24/2020    2:49 PM  PFT Results  FVC-Pre L 2.53    FVC-Predicted Pre % 63   FVC-Post L 2.47   FVC-Predicted Post % 61   Pre FEV1/FVC % % 50   Post FEV1/FCV % % 56   FEV1-Pre L 1.27   FEV1-Predicted Pre % 41   FEV1-Post L 1.37   DLCO uncorrected ml/min/mmHg 17.23   DLCO UNC% % 70   DLVA Predicted % 101     No results found for: "NITRICOXIDE"      Assessment & Plan:   COPD with asthma  Slow to resolve COPD/asthmatic bronchitic exacerbation plus or minus right lower lobe pneumonia.  Patient's completed 7-day course of antibiotics.  Will treat with a course of prednisone.  Control for triggers such as postnasal drainage and reflux.  Cough control regimen.  Will check D-dimer.  If D-dimer is positive.  Will order a CT angio chest.  With PE protocol  Plan  Patient Instructions  Change Claritin to Zyrtec 23m At bedtime   Begin Prilosec 254mdaily  Begin Pepcid 2038mt bedtime   Continue on Trelegy 1 puff daily , rinse after use .  Albuterol inhaler or neb As needed   Delsym 2 tsp Twice daily  for cough As needed   Tessalon Three times a day  As needed  cough  Prednisone taper over next week  Labs today  Set up CT chest  Xanax 0.90m30m needed  day of CT scan for anxiety . (Do not take with other sedating medications)  Follow up with Dr. AlvaElsworth Soho 4-6 weeks and As needed   Please contact office for sooner follow up if symptoms do not improve or worsen or seek emergency care          Generalized anxiety disorder Complains of severe anxiety with scans-will give Xanax 0.25 mg to use daily on exam.  Patient advised of sedating effect and to use with caution.  Patient aware that she will have someone drive her to the scan.  Abnormal CT of the chest Right lower bandlike scarring on chest x-ray.  Patient has a history of smoking.  Will check CT chest for further evaluation.    I spent   42 minutes dedicated to the care of this patient on the date of this encounter to include pre-visit review of records, face-to-face time with  the patient discussing conditions above, post visit ordering of testing, clinical documentation with the electronic health record, making appropriate referrals as documented, and communicating necessary findings to members of the patients care team.   TammRexene Edison 10/16/2022

## 2022-10-20 ENCOUNTER — Ambulatory Visit: Payer: Medicare Other | Admitting: Adult Health

## 2022-10-20 ENCOUNTER — Ambulatory Visit
Admission: RE | Admit: 2022-10-20 | Discharge: 2022-10-20 | Disposition: A | Discharge status: Home / Self Care | Payer: Medicare Other | Source: Ambulatory Visit | Attending: Family Medicine | Admitting: Family Medicine

## 2022-10-20 ENCOUNTER — Other Ambulatory Visit: Payer: Self-pay | Admitting: Family Medicine

## 2022-10-20 ENCOUNTER — Telehealth: Payer: Self-pay | Admitting: Cardiology

## 2022-10-20 DIAGNOSIS — R928 Other abnormal and inconclusive findings on diagnostic imaging of breast: Secondary | ICD-10-CM

## 2022-10-20 DIAGNOSIS — N6320 Unspecified lump in the left breast, unspecified quadrant: Secondary | ICD-10-CM

## 2022-10-20 DIAGNOSIS — Z803 Family history of malignant neoplasm of breast: Secondary | ICD-10-CM | POA: Diagnosis not present

## 2022-10-20 DIAGNOSIS — R7989 Other specified abnormal findings of blood chemistry: Secondary | ICD-10-CM

## 2022-10-20 NOTE — Telephone Encounter (Signed)
Patient returning call.

## 2022-10-20 NOTE — Telephone Encounter (Signed)
See other encounter

## 2022-10-20 NOTE — Telephone Encounter (Signed)
LMTCB

## 2022-10-20 NOTE — Telephone Encounter (Signed)
Pt stated her PCP and pulmonologist recommend she see Dr. Martinique soon due to possible CHF and for elevated BNP. She has had CXR, mammogram, and will have chest ct on 2/23.  H/O breast Ca. Appointment made for Dr. Martinique on 2/27.

## 2022-10-20 NOTE — Addendum Note (Signed)
Addended by: Betha Loa F on: 10/20/2022 05:02 PM   Modules accepted: Orders

## 2022-10-20 NOTE — Telephone Encounter (Signed)
Patient called stating her PCP, pulmonary doctor and the breast center are all leaning towards that she might have a heart problem.  She states she has been having SOB, she got pneumonia back in December, she states she had it for like 5 weeks.  She said her breast are swollen.  She said she has appt in May with Dr. Martinique but she thinks she might need to be seen sooner than that. They advised her to give [name] a call, she would like to speak to [place].

## 2022-10-23 ENCOUNTER — Ambulatory Visit (HOSPITAL_COMMUNITY)
Admission: RE | Admit: 2022-10-23 | Discharge: 2022-10-23 | Disposition: A | Discharge status: Home / Self Care | Payer: Medicare Other | Source: Ambulatory Visit | Attending: Adult Health | Admitting: Adult Health

## 2022-10-23 ENCOUNTER — Encounter (HOSPITAL_COMMUNITY): Payer: Self-pay

## 2022-10-23 DIAGNOSIS — J439 Emphysema, unspecified: Secondary | ICD-10-CM | POA: Diagnosis not present

## 2022-10-23 DIAGNOSIS — R911 Solitary pulmonary nodule: Secondary | ICD-10-CM | POA: Diagnosis not present

## 2022-10-23 DIAGNOSIS — J441 Chronic obstructive pulmonary disease with (acute) exacerbation: Secondary | ICD-10-CM | POA: Insufficient documentation

## 2022-10-26 ENCOUNTER — Encounter: Payer: Self-pay | Admitting: Pulmonary Disease

## 2022-10-26 ENCOUNTER — Ambulatory Visit (INDEPENDENT_AMBULATORY_CARE_PROVIDER_SITE_OTHER): Payer: Medicare Other | Admitting: Pulmonary Disease

## 2022-10-26 VITALS — BP 130/84 | HR 92 | Ht 70.0 in | Wt 334.0 lb

## 2022-10-26 DIAGNOSIS — R600 Localized edema: Secondary | ICD-10-CM | POA: Diagnosis not present

## 2022-10-26 DIAGNOSIS — R911 Solitary pulmonary nodule: Secondary | ICD-10-CM

## 2022-10-26 DIAGNOSIS — J4489 Other specified chronic obstructive pulmonary disease: Secondary | ICD-10-CM | POA: Diagnosis not present

## 2022-10-26 DIAGNOSIS — R0602 Shortness of breath: Secondary | ICD-10-CM | POA: Diagnosis not present

## 2022-10-26 NOTE — Progress Notes (Unsigned)
Synopsis: Referred in February 2024 for COPD with asthma by Arlester Marker, MD  Subjective:   PATIENT ID: Kelli Horn GENDER: female DOB: October 14, 1955, MRN: [id number]   HPI  Chief Complaint  Patient presents with   Follow-up    2wk f/u. Completed CT last week. States she is still no better, still has increased SOB and wheezing.    Kelli Horn is a 67 year old woman, former smoker with history of asthma-COPD overlap syndrome, sleep apnea intolerant to CPAP, history of breast cancer and endometrial cancer, severe peripheral vascular disease and diabetes who returns to pulmonary clinic for follow up.   She was seen 10/16/22 by Rexene Edison, NP and treated for exacerbation with prednisone and antibiotics. She was started on prilosec '20mg'$  daily and pepcid at betime for cough due to reflux. She doesn't feel much better since that visit.  CT Chest 10/26/21 shows moderate centrilobular and paraseptal emphysematous changes, upper lung predominant and 85m irregular/subsolid nodule with an 852msolid component of superior segment of RLL.   Absolute Eos 800 on 10/16/22  She reports being sick over the past 3 months.  - reports getting some sort of respiratory infection last fall and hasn't gotten better.  - Has on going shortness of breath - she is having swelling of her breasts, skin thickening.  - feels palpitations, when she lays down.  - No orthopnea - some swelling in the legs.  - no wheezing or cough   Seeing cardiology tomorrow, Dr. JoMartinique   Past Medical History:  Diagnosis Date   Acid reflux    Anxiety    Asthma    Atherosclerosis    Back pain    [place]   left breast DCIS   Chest pain    Chronic kidney disease    Right side question infarction   Constipation    COPD (chronic obstructive pulmonary disease) (HCC)    Diabetes mellitus without complication (HCC)    no meds   Edema of both lower extremities    GERD (gastroesophageal reflux  disease)    Hyperlipidemia    Hypertension    Joint pain    Kidney problem    Obesity    [place]   Peripheral vascular disease [place]   severe athrosclerosis   Sleep apnea     no cpap at this time. Sleep study done Jun 20 2020   SOB (shortness of breath)    Swallowing difficulty      Family History  Problem Relation Age of Onset   Lung cancer Mother    Schizophrenia Mother    Heart attack Father [id number] Aneurysm Father    Diabetes Father    Hypertension Father    High Cholesterol Father    Stroke Father    Obesity Father    AAA (abdominal aortic aneurysm) Father    Breast cancer Maternal Aunt    Heart disease Paternal Uncle    Aneurysm Paternal Uncle    Diabetes Other    Colon cancer Neg Hx    Cancer - Ovarian Neg Hx    Endometrial cancer Neg Hx    Pancreatic cancer Neg Hx    Prostate cancer Neg Hx      Social History   Socioeconomic History   Marital status: Married    Spouse name: Not on file   Number of children: 2   Years of education: Not on file   Highest  education level: Not on file  Occupational History   Occupation: retired , 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  Tobacco Use   Smoking status: Former    Packs/day: 2.00    Years: 10.00    Total pack years: 20.00    Types: Cigarettes    Quit date: 04/16/2004    Years since quitting: 18.5   Smokeless tobacco: Never  Vaping Use   Vaping Use: Never used  Substance and Sexual Activity   Alcohol use: No   Drug use: No   Sexual activity: Not Currently  Other Topics Concern   Not on file  Social History Narrative   Not on file   Social Determinants of Health   Financial Resource Strain: Medium Risk (01/01/2022)   Overall Financial Resource Strain (CARDIA)    Difficulty of Paying Living Expenses: Somewhat hard  Food Insecurity: Not on file  Transportation Needs: Not on file  Physical Activity: Not on file  Stress: Not on file  Social Connections: Not on file  Intimate  Partner Violence: Not on file     Allergies  Allergen Reactions   Beta Adrenergic Blockers Other (See Comments)    Trigger Asthma   Carvedilol Shortness Of Breath    Reacts with albuterol, worsens asthma symptoms when taken together    Peanut-Containing Drug Products Other (See Comments)     Trigger Asthma attack   Crestor [Rosuvastatin]     myalgias   Iodinated Contrast Media Other (See Comments)    Pt states that she has never had any reaction what so ever to contrast.    Statins Other (See Comments)   Zocor [Simvastatin]     myalgias     Outpatient Medications Prior to Visit  Medication Sig Dispense Refill   albuterol (PROVENTIL) (2.5 MG/3ML) 0.083% nebulizer solution Take 3 mLs (2.5 mg total) by nebulization every 6 (six) hours as needed for wheezing or shortness of breath. 75 mL 5   albuterol (VENTOLIN HFA) 108 (90 Base) MCG/ACT inhaler Inhale 2 puffs into the lungs every 4 (four) hours as needed for wheezing or shortness of breath. 8 g 3   aspirin EC 81 MG tablet Take 81 mg by mouth daily. Swallow whole.     cetirizine (ZYRTEC) 10 MG tablet Take 10 mg by mouth daily.     Fluticasone-Umeclidin-Vilant (TRELEGY ELLIPTA) 100-62.5-25 MCG/ACT AEPB Inhale 1 puff into the lungs daily. 1 each 5   hydrOXYzine (VISTARIL) 25 MG capsule Take 1 capsule (25 mg total) by mouth 4 (four) times daily as needed. 30 capsule 5   losartan (COZAAR) 50 MG tablet Take 1 tablet (50 mg total) by mouth daily. 90 tablet 3   metFORMIN (GLUCOPHAGE) 500 MG tablet Take 1 tablet (500 mg total) by mouth daily with breakfast. 90 tablet 3   Polyethyl Glycol-Propyl Glycol 0.4-0.3 % SOLN Place 1 drop into both eyes 3 (three) times daily as needed (dry eyes).      ALPRAZolam (XANAX) 0.25 MG tablet Take 1 tablet (0.25 mg total) by mouth 3 (three) times daily as needed for anxiety. Day of CT scan for anxiety , use with caution 3 tablet 0   benzonatate (TESSALON) 200 MG capsule Take 1 capsule (200 mg total) by mouth 3  (three) times daily as needed. 45 capsule 1   fluticasone (FLONASE) 50 MCG/ACT nasal spray Place 1 spray into both nostrils daily. 16 g 6   hydrochlorothiazide (MICROZIDE) 12.5 MG capsule TAKE 1 CAPSULE BY MOUTH EVERY DAY 90 capsule 3  loratadine (CLARITIN) 10 MG tablet Take 1 tablet (10 mg total) by mouth daily. 30 tablet 5   ondansetron (ZOFRAN) 4 MG tablet Take 1 tablet (4 mg total) by mouth every 8 (eight) hours as needed for nausea or vomiting. 20 tablet 0   predniSONE (DELTASONE) 10 MG tablet 4 tabs for 2 days, then 3 tabs for 2 days, 2 tabs for 2 days, then 1 tab for 2 days, then stop 20 tablet 0   No facility-administered medications prior to visit.    ROS    Objective:   Vitals:   10/26/22 1430  BP: 130/84  Pulse: 92  SpO2: 96%  Weight: (!) 334 lb (151.5 kg)  Height: '5\' 10"'$  (1.778 m)     Physical Exam    CBC    Component Value Date/Time   WBC 11.5 (H) 10/16/2022 0931   RBC 4.88 10/16/2022 0931   HGB 13.2 10/16/2022 0931   HGB 13.0 08/02/2020 1635   HCT 39.4 10/16/2022 0931   HCT 39.3 08/02/2020 1635   PLT 441.0 (H) 10/16/2022 0931   PLT 466 (H) 08/02/2020 1635   MCV 80.7 10/16/2022 0931   MCV 81 08/02/2020 1635   MCH 25.7 (L) 08/15/2022 2056   MCHC 33.5 10/16/2022 0931   RDW 15.7 (H) 10/16/2022 0931   RDW 12.4 08/02/2020 1635   LYMPHSABS 3.0 10/16/2022 0931   LYMPHSABS 2.7 04/19/2020 1144   MONOABS 1.1 (H) 10/16/2022 0931   EOSABS 0.8 (H) 10/16/2022 0931   EOSABS 0.4 04/19/2020 1144   BASOSABS 0.1 10/16/2022 0931   BASOSABS 0.1 04/19/2020 1144     Chest imaging:  PFT:    Latest Ref Rng & Units 04/24/2020    2:49 PM  PFT Results  FVC-Pre L 2.53   FVC-Predicted Pre % 63   FVC-Post L 2.47   FVC-Predicted Post % 61   Pre FEV1/FVC % % 50   Post FEV1/FCV % % 56   FEV1-Pre L 1.27   FEV1-Predicted Pre % 41   FEV1-Post L 1.37   DLCO uncorrected ml/min/mmHg 17.23   DLCO [facility]% % 70   DLVA Predicted % 101     Labs:  Path:  Echo 2021: LV  EF 55-60%. Mild concentric hypertrophy. RV function and size is normal. LA and RA are mildly elevated.   Heart Catheterization:       Assessment & Plan:   No diagnosis found.  Discussion: ***    Current Outpatient Medications:    albuterol (PROVENTIL) (2.5 MG/3ML) 0.083% nebulizer solution, Take 3 mLs (2.5 mg total) by nebulization every 6 (six) hours as needed for wheezing or shortness of breath., Disp: 75 mL, Rfl: 5   albuterol (VENTOLIN HFA) 108 (90 Base) MCG/ACT inhaler, Inhale 2 puffs into the lungs every 4 (four) hours as needed for wheezing or shortness of breath., Disp: 8 g, Rfl: 3   aspirin EC 81 MG tablet, Take 81 mg by mouth daily. Swallow whole., Disp: , Rfl:    cetirizine (ZYRTEC) 10 MG tablet, Take 10 mg by mouth daily., Disp: , Rfl:    Fluticasone-Umeclidin-Vilant (TRELEGY ELLIPTA) 100-62.5-25 MCG/ACT AEPB, Inhale 1 puff into the lungs daily., Disp: 1 each, Rfl: 5   hydrOXYzine (VISTARIL) 25 MG capsule, Take 1 capsule (25 mg total) by mouth 4 (four) times daily as needed., Disp: 30 capsule, Rfl: 5   losartan (COZAAR) 50 MG tablet, Take 1 tablet (50 mg total) by mouth daily., Disp: 90 tablet, Rfl: 3   metFORMIN (GLUCOPHAGE) 500 MG  tablet, Take 1 tablet (500 mg total) by mouth daily with breakfast., Disp: 90 tablet, Rfl: 3   Polyethyl Glycol-Propyl Glycol 0.4-0.3 % SOLN, Place 1 drop into both eyes 3 (three) times daily as needed (dry eyes). , Disp: , Rfl:

## 2022-10-26 NOTE — Progress Notes (Unsigned)
Cardiology Office Note   Date:  10/27/2022   ID:  Kelli, Horn 1956-05-20, MRN [id number]  PCP:  [name], MD  Cardiologist:   Stephane Junkins Martinique, MD   Chief Complaint  Patient presents with   Shortness of Breath      History of Present Illness: Kelli Horn is a 67 y.o. female who is seen for follow up  SOB. She has a history of DM, HLD,  and HTN. Also severe PAD but nonobstructive CAD. She was seen in the past by Dr Debara Pickett in 2017. Myoview at that time was normal. Echo showed severe LVH with normal systolic function and normal valves. Ecg was normal.   Other studies of note include plain X rays dating back to 2017 that showed significant aortic atherosclerosis. She recently had Abdominal/pelvic CT on 04/05/20 showing severe bilateral common iliac stenosis. 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 There was medical renal disease. She has an [name] report apparently showing a 2.9 cm gallstone but no gallstone noted on CT.  We evaluated her with cardiac cath showing mild nonobstructive CAD. No significant right subclavian/innominate gradient. Mildly elevated LVEDP. Echo showed mild LVH with normal systolic function. Arterial doppler studies show severe bilateral iliac disease with moderate bilateral reduced ABIs and reduced toe pressures. No significant carotid disease but bilateral subclavian disease noted. She did have PFTs showing severe obstructive disease. Pulmonary evaluation was done who felt she had predominantly asthma. Was started on Trelegy and she noted significant improvement in her breathing. She did have a home sleep study in October showing mild OSA. Being managed by Dr [name]. She also had colonoscopy in October showing multiple polyps that were removed.   She has also was diagnosed with left breast carcinoma. She underwent bilateral breast lumpectomies on October 8. She then underwent total hysterectomy with BSO for  endometrial CA on 07/11/20. She decided against RT for her breast CA. Followed by Dr Burr Medico. 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 No endovascular options. Was seen by [facility]- Dr [name]. It was felt that surgery would be beneficial but with higher risk due to morbid obesity. Was referred to Weight management to see if she could lose weight to improve her operative risk. She was placed on Rybelsius but unable to tolerate. On his last visit with Dr [name] in August she had gained 10 lbs and continued conservative therapy recommended unless symptoms progress.   I last saw [name] about a year ago. More recently she has experienced symptoms of increased SOB. She had PFTs in July which showed some improvement. In Dec she reports she had a lung infection and PNA. Since then her breathing has been bad. She also reports significant breast swelling L>R. She has had mammograms and CT. Plans on having a MRI done and possible breast biopsy. She states her breathing is the worst it has ever been. Denies orthopnea or PND. Describes some swelling in legs but no worse than usual. Weight is down.    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 Sleep study done Jun 20 2020   SOB (shortness of breath)    Swallowing difficulty     Past Surgical  History:  Procedure Laterality Date   ABDOMINAL AORTOGRAM W/LOWER EXTREMITY Bilateral 08/08/2020   Procedure: ABDOMINAL AORTOGRAM W/LOWER EXTREMITY;  Surgeon: [name], MD;  Location: [facility];  Service: Cardiovascular;  Laterality: Bilateral;   APPENDECTOMY     BIOPSY  06/27/2020   Procedure: BIOPSY;  Surgeon: [name], MD;  Location: [facility];  Service: Endoscopy;;   BREAST LUMPECTOMY WITH RADIOACTIVE SEED AND SENTINEL LYMPH NODE BIOPSY Bilateral 06/07/2020   Procedure: BILATERAL BREAST LUMPECTOMY WITH RADIOACTIVE SEED AND LEFT SENTINEL LYMPH NODE BIOPSY;  Surgeon: [name], MD;  Location: [facility];  Service: General;  Laterality: Bilateral;  PEC BLOCK   COLONOSCOPY WITH PROPOFOL N/A 06/27/2020   Procedure: COLONOSCOPY WITH PROPOFOL;  Surgeon: [name], MD;  Location: [facility];  Service: Endoscopy;  Laterality: N/A;   LEFT HEART CATH AND CORONARY ANGIOGRAPHY N/A 04/23/2020   Procedure: LEFT HEART CATH AND CORONARY ANGIOGRAPHY;  Surgeon: Martinique, Imo Cumbie M, MD;  Location: [facility];  Service: Cardiovascular;  Laterality: N/A;   POLYPECTOMY  06/27/2020   Procedure: POLYPECTOMY;  Surgeon: [name], MD;  Location: [facility];  Service: Endoscopy;;   ROBOTIC ASSISTED TOTAL HYSTERECTOMY WITH BILATERAL SALPINGO OOPHERECTOMY N/A 07/11/2020   Procedure: XI ROBOTIC ASSISTED TOTAL HYSTERECTOMY WITH BILATERAL SALPINGO OOPHORECTOMY;  Surgeon: [name], MD;  Location: [facility];  Service: Gynecology;  Laterality: N/A;     Current Outpatient Medications  Medication Sig Dispense Refill   albuterol (PROVENTIL) (2.5 MG/3ML) 0.083% nebulizer solution Take 3 mLs (2.5 mg total) by nebulization every 6 (six) hours as needed for wheezing or shortness of breath. 75 mL 5   albuterol (VENTOLIN HFA) 108 (90 Base) MCG/ACT inhaler Inhale 2 puffs into the lungs every 4 (four) hours as needed for wheezing or shortness of breath. 8 g 3    aspirin EC 81 MG tablet Take 81 mg by mouth daily. Swallow whole.     cetirizine (ZYRTEC) 10 MG tablet Take 10 mg by mouth daily.     famotidine (PEPCID) 20 MG tablet Take 20 mg by mouth at bedtime.     Fluticasone-Umeclidin-Vilant (TRELEGY ELLIPTA) 100-62.5-25 MCG/ACT AEPB Inhale 1 puff into the lungs daily. 1 each 5   hydrOXYzine (VISTARIL) 25 MG capsule Take 1 capsule (25 mg total) by mouth 4 (four) times daily as needed. 30 capsule 5   losartan (COZAAR) 50 MG tablet Take 1 tablet (50 mg total) by mouth daily. 90 tablet 3   metFORMIN (GLUCOPHAGE) 500 MG tablet Take 1 tablet (500 mg total) by mouth daily with breakfast. 90 tablet 3   Polyethyl Glycol-Propyl Glycol 0.4-0.3 % SOLN Place 1 drop into both eyes 3 (three) times daily as needed (dry eyes).      No current facility-administered medications for this visit.    Allergies:   Beta adrenergic blockers, Carvedilol, Peanut-containing drug products, Crestor [  rosuvastatin], Iodinated contrast media, Statins, and Zocor [simvastatin]    Social History:  The patient  reports that she quit smoking about 18 years ago. Her smoking use included cigarettes. She has a 20.00 pack-year smoking history. She has never used smokeless tobacco. She reports that she does not drink alcohol and does not use drugs.   Family History:  The patient's family history includes AAA (abdominal aortic aneurysm) in her father; Aneurysm in her father and paternal uncle; Breast cancer in her maternal aunt; Diabetes in her father and another family member; Heart attack (age of onset: 74) in her father; Heart disease in her paternal uncle; High Cholesterol in her father; Hypertension in her father; Lung cancer in her mother; Obesity in her father; Schizophrenia in her mother; Stroke in her father.    ROS:  Please see the history of present illness.   Otherwise, review of systems are negative.   All other systems are reviewed and negative.    PHYSICAL EXAM: VS:  BP 138/82    Pulse 95   Ht '5\' 10"'$  (1.778 m)   Wt (!) 325 lb 12.8 oz (147.8 kg)   LMP 12/09/2013   SpO2 98%   BMI 46.75 kg/m  , BMI Body mass index is 46.75 kg/m. GEN: Well nourished, morbidly obese, in no acute distress  HEENT: normal  Neck: no JVD,  Left carotid and bilateral subclavian bruits.  Cardiac: RRR; no murmurs, rubs, or gallops,no edema. Femoral and pedal pulses are not palpable.  Breasts are large and pendulous.  Respiratory:  clear to auscultation bilaterally, normal work of breathing GI: morbidly obese with large panus. Soft, nontender, nondistended, + BS. I don't hear renal bruits MS: no deformity or atrophy  Skin: warm and dry, no rash Neuro:  Strength and sensation are intact Psych: euthymic mood, full affect   EKG:  EKG is   ordered today. NSR rate 95. Low voltage. Otherwise normal. .I have personally reviewed and interpreted this study.     Recent Labs: 08/15/2022: ALT 30; B Natriuretic Peptide 234.0 10/16/2022: BUN 17; Creatinine, Ser 1.12; Hemoglobin 13.2; Platelets 441.0; Potassium 4.3; Sodium 136    Lipid Panel    Component Value Date/Time   CHOL 194 02/11/2021 1357   CHOL 196 04/19/2020 1144   TRIG 141.0 02/11/2021 1357   HDL 40.60 02/11/2021 1357   HDL 35 (L) 04/19/2020 1144   CHOLHDL 5 02/11/2021 1357   VLDL 28.2 02/11/2021 1357   LDLCALC 125 (H) 02/11/2021 1357   LDLCALC 133 (H) 04/19/2020 1144   Additional labs from Dr Arelia Sneddon: dated 02/07/20: D dimer normal. BNP 54. Glucose 118. UA 7.4. otherwise CMET normal. CBC normal Hgb 14.7. TFTS normal. Cholesterol 207, triglycerides 137, HDL 40, LDL 142.  Urinalysis normal.  Wt Readings from Last 3 Encounters:  10/27/22 (!) 325 lb 12.8 oz (147.8 kg)  10/26/22 (!) 334 lb (151.5 kg)  10/16/22 (!) 332 lb (150.6 kg)      Other studies Reviewed: Additional studies/ records that were reviewed today include:   Myoview 12/26/15: Study Highlights  Nuclear stress EF: 62%. There was no ST segment deviation noted  during stress. No T wave inversion was noted during stress. The study is normal. This is a low risk study. The left ventricular ejection fraction is normal (55-65%).   Echo 12/11/15: Study Conclusions   - Left ventricle: The cavity size was normal. Wall thickness was    increased in a pattern of severe LVH. Systolic function was  normal. The estimated ejection fraction was in the range of 60%    to 65%. Doppler parameters are consistent with abnormal left    ventricular relaxation (grade 1 diastolic dysfunction). The E/e&'    ratio is between 8-15, suggesting indeterminate LV filling    pressure.  - Aortic valve: Trileaflet. Sclerosis without stenosis. There was    no regurgitation.  - Mitral valve: Calcified annulus. There was trivial regurgitation.  - Left atrium: The atrium was mildly dilated.  - Tricuspid valve: There was trivial regurgitation.  - Pulmonary arteries: PA peak pressure: 32 mm Hg (S).  - Inferior vena cava: The vessel was dilated. The respirophasic    diameter changes were blunted (< 50%), consistent with elevated    central venous pressure.   Cardiac cath 04/23/20:  LEFT HEART CATH AND CORONARY ANGIOGRAPHY  Conclusion    Mid RCA lesion is 25% stenosed. Mid LAD lesion is 25% stenosed. The left ventricular systolic function is normal. LV end diastolic pressure is mildly elevated. The left ventricular ejection fraction is greater than 65% by visual estimate.   1. Nonobstructive CAD 2. Normal LV function 3. Mildly elevated LVEDP 4. Hypertensive 5. No significant right innominate or subclavian pressure gradient.    Plan: will optimize BP control. Will arrange PFTs, Echo, Carotid and LE arterial dopplers. She is intolerant of beta blockers. Will initiate amlodipine in addition to enalapril for BP control.   Echo 04/30/20: IMPRESSIONS     1. Left ventricular ejection fraction, by estimation, is 55 to 60%. The  left ventricle has normal function. The left  ventricle has no regional  wall motion abnormalities. There is mild concentric left ventricular  hypertrophy. Left ventricular diastolic  parameters are indeterminate. Elevated left ventricular end-diastolic  pressure.   2. Right ventricular systolic function is normal. The right ventricular  size is normal. Tricuspid regurgitation signal is inadequate for assessing  PA pressure.   3. Left atrial size was mildly dilated.   4. Right atrial size was mildly dilated.   5. The mitral valve is normal in structure. Trivial mitral valve  regurgitation. No evidence of mitral stenosis. Moderate mitral annular  calcification.   6. The aortic valve is tricuspid. There is mild calcification of the  aortic valve. There is mild thickening of the aortic valve. Aortic valve  regurgitation is not visualized. Mild aortic valve sclerosis is present,  with no evidence of aortic valve  stenosis.   7. The inferior vena cava is normal in size with greater than 50%  respiratory variability, suggesting right atrial pressure of 3 mmHg.   Conclusion(s)/Recommendation(s): Otherwise normal echocardiogram, with  minor abnormalities described in the report  LE arterial dopplers:  Aorta-Iliacs: Technically challenging and limited evaluation.   Monophasic waveforms in the bilateral external iliac arteries suggest  severe stenosis and/or occlusion of the bilateral common iliac arteries.  Unable to directly visualize the common iliac arteries due to patient body  habitus and overlying bowel gas.   Right: Near normal infrainguinal examination. No evidence of focal  stenosis.   Left: Near normal infrainguinal examination. No evidence of focal  stenosis.   Summary:  Right: Resting right ankle-brachial index indicates moderate right lower  extremity arterial disease. The right toe-brachial index is abnormal.   Left: Resting left ankle-brachial index indicates moderate left lower  extremity arterial disease. The  left toe-brachial index is abnormal.   Carotid dopplers 05/16/20: Summary:  Right Carotid: Velocities in the right ICA are consistent with a 1-39%  stenosis.   Left Carotid: Velocities in the left ICA are consistent with a 40-59%  stenosis.   Vertebrals:  Left vertebral artery demonstrates antegrade flow. Right  vertebral               artery demonstrates no discernable flow.  Subclavians: Left subclavian artery was stenotic. Right subclavian artery  flow               was disturbed.   *See table(s) above for measurements and observations.  Suggest follow up study in 12 months.   ASSESSMENT AND PLAN:  1.  SOB. I still think this is mostly  secondary to severe asthma. When I saw her initially in 2019 her breathing was much worse. Echo at that time was normal. Cardiac cath showed mild nonobstructive CAD and mildly elevated LVEDP 17 mm Hg. Recent CXR/CT did not show significant edema. She does have some coronary calcification which is not surprising. Her symptoms appear to have worsened since she had an infection in December. Her BNP is mildly elevated but lower than it was 6 years ago. I don't think she has significant pitting edema on exam. We will start with an Echo. If this is normal again then the only other consideration would be to do a right heart cath to look at filling pressures and pulmonary pressures.  2. HTN. Under reasonable control. Patient reports she was prescribed losartan but she has actually continued taking enalapril.  3. HLD. Reports intolerance of statins.  Goal LDL is < 70 with her severe PAD. Will need to consider alternative lipid lowering therapy once acute issues taken care of.  4. Morbid obesity.  5. PAD. 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 Some bilateral subclavian disease but no gradient on the right with cath. Seen by [facility]. Stable claudication. Bypass surgery high risk due to co morbidities.  91. Strong family  history of premature vascular disease. 7. Former tobacco abuse.  8. Multiple colon polyps. S/p removal 9. Left breast carcinoma. S/p bilateral lumpectomies. Now with significant breast enlargement/tenderness. Awaiting breast MRI. May need biopsy. 10. Nonobstructive CAD.  11. Endometrial CA stage 1A s/p total hysterectomy/BSO 12.  OSA per pulmonary.   Current medicines are reviewed at length with the patient today.  The patient does not have concerns regarding medicines.  The following changes have been made:  See above  Labs/ tests ordered today include:   Orders Placed This Encounter  Procedures   EKG 12-Lead    Disposition:   FU depending on results of Echo.  Signed, Sybella Harnish Martinique, MD  10/27/2022 3:43 PM    [facility] [address], [place], [place], [address] Phone [phone], Fax [phone]

## 2022-10-26 NOTE — Patient Instructions (Signed)
Continue trelegy ellipta 1 puff daily - rinse mouth out after each use  We will check a CT Chest scan in 3 months to follow up on your lung nodule  Follow up in 3 months

## 2022-10-27 ENCOUNTER — Encounter: Payer: Self-pay | Admitting: Cardiology

## 2022-10-27 ENCOUNTER — Ambulatory Visit: Payer: Medicare Other | Attending: Cardiology | Admitting: Cardiology

## 2022-10-27 ENCOUNTER — Ambulatory Visit: Payer: Medicare Other | Admitting: Adult Health

## 2022-10-27 VITALS — BP 138/82 | HR 95 | Ht 70.0 in | Wt 325.8 lb

## 2022-10-27 DIAGNOSIS — E785 Hyperlipidemia, unspecified: Secondary | ICD-10-CM | POA: Diagnosis not present

## 2022-10-27 DIAGNOSIS — R0602 Shortness of breath: Secondary | ICD-10-CM

## 2022-10-27 DIAGNOSIS — I10 Essential (primary) hypertension: Secondary | ICD-10-CM | POA: Diagnosis not present

## 2022-10-27 DIAGNOSIS — I739 Peripheral vascular disease, unspecified: Secondary | ICD-10-CM

## 2022-10-27 NOTE — Patient Instructions (Signed)
Medication Instructions:  No changes *If you need a refill on your cardiac medications before your next appointment, please call your pharmacy*  Follow-Up: At [facility], you and your health needs are our priority.  As part of our continuing mission to provide you with exceptional heart care, we have created designated Provider Care Teams.  These Care Teams include your primary Cardiologist (physician) and Advanced Practice Providers (APPs -  Physician Assistants and Nurse Practitioners) who all work together to provide you with the care you need, when you need it.  We recommend signing up for the patient portal called "MyChart".  Sign up information is provided on this After Visit Summary.  MyChart is used to connect with patients for Virtual Visits (Telemedicine).  Patients are able to view lab/test results, encounter notes, upcoming appointments, etc.  Non-urgent messages can be sent to your provider as well.   To learn more about what you can do with MyChart, go to [email or web address].    Your next appointment:   Follow up Pending Echo results with Dr Martinique

## 2022-10-29 ENCOUNTER — Encounter: Payer: Self-pay | Admitting: Pulmonary Disease

## 2022-11-10 DIAGNOSIS — D0512 Intraductal carcinoma in situ of left breast: Secondary | ICD-10-CM | POA: Diagnosis not present

## 2022-11-11 ENCOUNTER — Other Ambulatory Visit: Payer: Self-pay | Admitting: General Surgery

## 2022-11-11 DIAGNOSIS — D0512 Intraductal carcinoma in situ of left breast: Secondary | ICD-10-CM

## 2022-11-12 ENCOUNTER — Ambulatory Visit (INDEPENDENT_AMBULATORY_CARE_PROVIDER_SITE_OTHER): Payer: Medicare Other | Admitting: Family Medicine

## 2022-11-12 ENCOUNTER — Encounter: Payer: Self-pay | Admitting: Family Medicine

## 2022-11-12 VITALS — BP 148/84 | HR 83 | Temp 98.2°F | Ht 70.0 in | Wt 331.8 lb

## 2022-11-12 DIAGNOSIS — E119 Type 2 diabetes mellitus without complications: Secondary | ICD-10-CM

## 2022-11-12 DIAGNOSIS — Z23 Encounter for immunization: Secondary | ICD-10-CM

## 2022-11-12 DIAGNOSIS — I10 Essential (primary) hypertension: Secondary | ICD-10-CM | POA: Diagnosis not present

## 2022-11-12 DIAGNOSIS — Z1159 Encounter for screening for other viral diseases: Secondary | ICD-10-CM

## 2022-11-12 LAB — URINALYSIS, ROUTINE W REFLEX MICROSCOPIC
Bilirubin Urine: NEGATIVE
Hgb urine dipstick: NEGATIVE
Ketones, ur: NEGATIVE
Leukocytes,Ua: NEGATIVE
Nitrite: NEGATIVE
Specific Gravity, Urine: 1.015 (ref 1.000–1.030)
Total Protein, Urine: NEGATIVE
Urine Glucose: NEGATIVE
Urobilinogen, UA: 0.2 (ref 0.0–1.0)
pH: 6 (ref 5.0–8.0)

## 2022-11-12 LAB — LIPID PANEL
Cholesterol: 225 mg/dL — ABNORMAL HIGH (ref 0–200)
HDL: 45.2 mg/dL (ref >39.00)
LDL Cholesterol: 153 mg/dL — ABNORMAL HIGH (ref 0–99)
NonHDL: 179.95
Total CHOL/HDL Ratio: 5
Triglycerides: 134 mg/dL (ref 0.0–149.0)
VLDL: 26.8 mg/dL (ref 0.0–40.0)

## 2022-11-12 LAB — BASIC METABOLIC PANEL
BUN: 13 mg/dL (ref 6–23)
CO2: 28 mEq/L (ref 19–32)
Calcium: 9.1 mg/dL (ref 8.4–10.5)
Chloride: 101 mEq/L (ref 96–112)
Creatinine, Ser: 1 mg/dL (ref 0.40–1.20)
GFR: 59.02 mL/min — ABNORMAL LOW (ref >60.00)
Glucose, Bld: 116 mg/dL — ABNORMAL HIGH (ref 70–99)
Potassium: 4 mEq/L (ref 3.5–5.1)
Sodium: 139 mEq/L (ref 135–145)

## 2022-11-12 LAB — HEMOGLOBIN A1C: Hgb A1c MFr Bld: 7.1 % — ABNORMAL HIGH (ref 4.6–6.5)

## 2022-11-12 LAB — MICROALBUMIN / CREATININE URINE RATIO
Creatinine,U: 76.3 mg/dL
Microalb Creat Ratio: 0.9 mg/g (ref 0.0–30.0)
Microalb, Ur: <0.7 mg/dL (ref 0.0–1.9)

## 2022-11-12 MED ORDER — OZEMPIC (0.25 OR 0.5 MG/DOSE) 2 MG/3ML ~~LOC~~ SOPN: 0.2500 mg | PEN_INJECTOR | SUBCUTANEOUS | 1 refills | Status: DC

## 2022-11-12 NOTE — Assessment & Plan Note (Addendum)
Kelli Horn did not tolerate metformin. I will try prescribing semaglutide (Ozempic) and see if we can get this covered. She would benefit from this for blood sugar control as well as weight loss. I will also check her annual DM labs.

## 2022-11-12 NOTE — Progress Notes (Signed)
[facility] [address] [place] [address] Dept: [phone] Dept Fax: [phone]  Chronic Care Office Visit  Subjective:    Patient ID: Kelli Horn, female    DOB: [birth year], 67 y.o..   MRN: [id number]  Chief Complaint  Patient presents with   Medical Management of Chronic Issues    F/u HTN. No concerns.     History of Present Illness:  Patient is in today for reassessment of chronic medical issues.  [name] has a history of hypertension. She is currently taking enalapril 20 mg bid. At her last appointment, I tried to switch her to losartan, but [name] had fear that her blood pressure control would get out of hand. She also didn't want to make too many changes at once. She was in recently to see her cardiologist and her blood pressure was adequately controlled at that time.   [name] has a history of Type 2 diabetes. She also continues to have issues with obesity. I had restarted her metformin at her last visit, however, she once again developed intolerable GI symptoms with epigastric pain and abdominal cramping. She is no longer taking this medicine.  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   Abnormal CT of the chest 10/16/2022   Cholelithiasis 10/14/2022   Atelectasis 10/14/2022   Abnormal mammogram of left breast 10/14/2022   History of ductal carcinoma in situ (DCIS) of breast 08/03/2022   Type 2 diabetes mellitus without complication, without long-term current use of insulin ([place]) 08/03/2022   Generalized anxiety disorder 01/09/2021   OSA (obstructive sleep apnea) 10/21/2020   Diverticular disease of colon 10/02/2020   Periodontal disease 10/02/2020   Atherosclerosis of aortic bifurcation and common iliac arteries ([place]) 08/20/2020   Endometrial cancer ([place]) 07/11/2020   Rectal bleeding 06/27/2020   Carotid artery disease  ([place]) 05/29/2020   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   PAD (peripheral artery disease) ([place]) 04/23/2020   Upper back pain 03/24/2016   Left shoulder pain 03/24/2016   Hypersomnia with sleep apnea 02/13/2016   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   Morbid obesity with body mass index (BMI) of 45.0 to 49.9 in adult ([place]) 02/13/2016   Circadian rhythm sleep disorder, 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   Weakness of both legs 02/13/2016   Angina syndrome, abdominal ([place]) 02/13/2016   History of progressive weakness 01/10/2016   Gait instability 01/10/2016   Essential hypertension 12/11/2015   Moderate persistent asthma 12/11/2015   Normocytic anemia 12/11/2015   Leukocytosis 12/11/2015   Unstable angina ([place]) 12/11/2015   Chest pain with moderate risk for cardiac etiology    Dyslipidemia    Past Surgical History:  Procedure Laterality Date   ABDOMINAL AORTOGRAM W/LOWER EXTREMITY Bilateral 08/08/2020   Procedure: ABDOMINAL AORTOGRAM W/LOWER EXTREMITY;  Surgeon: [name], MD;  Location: [facility];  Service: Cardiovascular;  Laterality: Bilateral;   APPENDECTOMY     BIOPSY  06/27/2020   Procedure: BIOPSY;  Surgeon: [name], MD;  Location: [facility];  Service: Endoscopy;;   BREAST LUMPECTOMY WITH RADIOACTIVE SEED AND SENTINEL LYMPH NODE BIOPSY Bilateral 06/07/2020   Procedure: BILATERAL BREAST LUMPECTOMY WITH RADIOACTIVE SEED AND LEFT SENTINEL LYMPH NODE BIOPSY;  Surgeon: [name], MD;  Location: [facility];  Service: General;  Laterality: Bilateral;  PEC BLOCK   COLONOSCOPY WITH PROPOFOL N/A 06/27/2020   Procedure:  COLONOSCOPY WITH PROPOFOL;  Surgeon: [name], MD;  Location: [facility];  Service: Endoscopy;  Laterality: N/A;   LEFT HEART CATH AND CORONARY ANGIOGRAPHY N/A 04/23/2020   Procedure: LEFT HEART CATH AND CORONARY ANGIOGRAPHY;  Surgeon: Martinique, Peter M, MD;  Location: [facility];  Service:  Cardiovascular;  Laterality: N/A;   POLYPECTOMY  06/27/2020   Procedure: POLYPECTOMY;  Surgeon: [name], MD;  Location: [facility];  Service: Endoscopy;;   ROBOTIC ASSISTED TOTAL HYSTERECTOMY WITH BILATERAL SALPINGO OOPHERECTOMY N/A 07/11/2020   Procedure: XI ROBOTIC ASSISTED TOTAL HYSTERECTOMY WITH BILATERAL SALPINGO OOPHORECTOMY;  Surgeon: [name], MD;  Location: [facility];  Service: Gynecology;  Laterality: N/A;   Family History  Problem Relation Age of Onset   Lung cancer Mother    Schizophrenia Mother    Heart attack Father 54   Aneurysm Father    Diabetes Father    Hypertension Father    High Cholesterol Father    Stroke Father    Obesity Father    AAA (abdominal aortic aneurysm) Father    Breast cancer Maternal Aunt    Heart disease Paternal Uncle    Aneurysm Paternal Uncle    Diabetes Other    Colon cancer Neg Hx    Cancer - Ovarian Neg Hx    Endometrial cancer Neg Hx    Pancreatic cancer Neg Hx    Prostate cancer Neg Hx    Outpatient Medications Prior to Visit  Medication Sig Dispense Refill   albuterol (PROVENTIL) (2.5 MG/3ML) 0.083% nebulizer solution Take 3 mLs (2.5 mg total) by nebulization every 6 (six) hours as needed for wheezing or shortness of breath. 75 mL 5   albuterol (VENTOLIN HFA) 108 (90 Base) MCG/ACT inhaler Inhale 2 puffs into the lungs every 4 (four) hours as needed for wheezing or shortness of breath. 8 g 3   aspirin EC 81 MG tablet Take 81 mg by mouth daily. Swallow whole.     cetirizine (ZYRTEC) 10 MG tablet Take 10 mg by mouth daily.     enalapril (VASOTEC) 10 MG tablet Take 20 mg by mouth 2 (two) times daily.     famotidine (PEPCID) 20 MG tablet Take 20 mg by mouth at bedtime.     Fluticasone-Umeclidin-Vilant (TRELEGY ELLIPTA) 100-62.5-25 MCG/ACT AEPB Inhale 1 puff into the lungs daily. 1 each 5   hydrOXYzine (VISTARIL) 25 MG capsule Take 1 capsule (25 mg total) by mouth 4 (four) times daily as needed. 30 capsule 5   loratadine  (CLARITIN) 10 MG tablet Take 10 mg by mouth daily as needed.     LORazepam (ATIVAN) 1 MG tablet Take by mouth.     Polyethyl Glycol-Propyl Glycol 0.4-0.3 % SOLN Place 1 drop into both eyes 3 (three) times daily as needed (dry eyes).      losartan (COZAAR) 50 MG tablet Take 1 tablet (50 mg total) by mouth daily. (Patient not taking: Reported on 11/12/2022) 90 tablet 3   metFORMIN (GLUCOPHAGE) 500 MG tablet Take 1 tablet (500 mg total) by mouth daily with breakfast. (Patient not taking: Reported on 11/12/2022) 90 tablet 3   No facility-administered medications prior to visit.   Allergies  Allergen Reactions   Beta Adrenergic Blockers Other (See Comments)    Trigger Asthma   Carvedilol Shortness Of Breath    Reacts with albuterol, worsens asthma symptoms when taken together    Peanut-Containing Drug Products Other (See Comments)     Trigger Asthma attack   Crestor [Rosuvastatin]  myalgias   Iodinated Contrast Media Other (See Comments)    Pt states that she has never had any reaction what so ever to contrast.    Statins Other (See Comments)   Zocor [Simvastatin]     myalgias   Objective:   Today's Vitals   11/12/22 0949  BP: (!) 150/86  Pulse: 83  Temp: 98.2 F (36.8 C)  TempSrc: Temporal  SpO2: 95%  Weight: (!) 331 lb 12.8 oz (150.5 kg)  Height: '5\' 10"'$  (1.778 m)   Body mass index is 47.61 kg/m.   General: Well developed, well nourished. No acute distress. Feet- Skin intact. No sign of maceration between toes. Nails are normal. Dorsalis pedis and posterior tibial   artery pulses are normal. 5.07 monofilament testing normal. Psych: Alert and oriented. Normal mood and affect.  Health Maintenance Due  Topic Date Due   Medicare Annual Wellness (AWV)  Never done   HIV Screening  Never done   Diabetic kidney evaluation - Urine ACR  Never done   Hepatitis C Screening  Never done   DTaP/Tdap/Td (1 - Tdap) Never done   Zoster Vaccines- Shingrix (1 of 2) Never done   DEXA  SCAN  Never done     Assessment & Plan:   Problem List Items Addressed This Visit       Cardiovascular and Mediastinum   Essential hypertension - Primary    Ms. Suit blood pressure is elevated today. She has stayed on enalapril 20 mg bid. I will continue this for the time being, but plan to reassess her BP in 2 months. If not at goal then, I iwll try and switch her to losartan again.      Relevant Medications   enalapril (VASOTEC) 10 MG tablet   Other Relevant Orders   Microalbumin / creatinine urine ratio   Basic metabolic panel     Endocrine   Type 2 diabetes mellitus without complication, without long-term current use of insulin ([place])    Ms. Botros did not tolerate metformin. I will try prescribing semaglutide (Ozempic) and see if we can get this covered. She would benefit from this for blood sugar control as well as weight loss. I will also check her annual DM labs.      Relevant Medications   enalapril (VASOTEC) 10 MG tablet   Semaglutide,0.25 or 0.'5MG'$ /DOS, (OZEMPIC, 0.25 OR 0.5 MG/DOSE,) 2 MG/3ML SOPN   Other Relevant Orders   Lipid panel   Microalbumin / creatinine urine ratio   Basic metabolic panel   Hemoglobin A1c   Urinalysis, Routine w reflex microscopic   Other Visit Diagnoses     Need for pneumococcal 20-valent conjugate vaccination       Relevant Orders   Pneumococcal conjugate vaccine 20-valent (Prevnar 20) (Completed)   Encounter for hepatitis C screening test for low risk patient       Relevant Orders   HCV Ab w Reflex to Quant PCR       Return in about 2 months (around 01/12/2023) for Reassessment.   [name], MD

## 2022-11-12 NOTE — Assessment & Plan Note (Signed)
Kelli Horn blood pressure is elevated today. She has stayed on enalapril 20 mg bid. I will continue this for the time being, but plan to reassess her BP in 2 months. If not at goal then, I iwll try and switch her to losartan again.

## 2022-11-13 LAB — HCV INTERPRETATION

## 2022-11-13 LAB — HCV AB W REFLEX TO QUANT PCR: HCV Ab: NONREACTIVE

## 2022-11-17 ENCOUNTER — Ambulatory Visit (HOSPITAL_COMMUNITY): Payer: Medicare Other

## 2022-11-17 ENCOUNTER — Ambulatory Visit: Payer: Medicare Other | Admitting: Vascular Surgery

## 2022-11-19 ENCOUNTER — Telehealth: Payer: Self-pay

## 2022-11-19 ENCOUNTER — Ambulatory Visit (HOSPITAL_COMMUNITY): Payer: Medicare Other | Attending: Cardiology

## 2022-11-19 ENCOUNTER — Ambulatory Visit (HOSPITAL_BASED_OUTPATIENT_CLINIC_OR_DEPARTMENT_OTHER): Payer: Medicare Other | Admitting: Pulmonary Disease

## 2022-11-19 DIAGNOSIS — R7989 Other specified abnormal findings of blood chemistry: Secondary | ICD-10-CM | POA: Insufficient documentation

## 2022-11-19 DIAGNOSIS — I509 Heart failure, unspecified: Secondary | ICD-10-CM | POA: Insufficient documentation

## 2022-11-19 LAB — ECHOCARDIOGRAM COMPLETE
Area-P 1/2: 3.88 cm2
S' Lateral: 3.7 cm

## 2022-11-19 MED ORDER — PERFLUTREN LIPID MICROSPHERE
1.0000 mL | INTRAVENOUS | Status: AC | PRN
  Administered 2022-11-19: 2 mL via INTRAVENOUS

## 2022-11-19 NOTE — Telephone Encounter (Signed)
LVM for pt to call the office to get scheduled for [facility] appointment. Please transfer to my ext [phone] if patient cb. Pt due for [facility] by 01/30/23.

## 2022-12-02 ENCOUNTER — Telehealth: Payer: Self-pay | Admitting: Pulmonary Disease

## 2022-12-03 ENCOUNTER — Telehealth: Payer: Self-pay | Admitting: Hematology

## 2022-12-03 NOTE — Telephone Encounter (Signed)
Contacted patient to scheduled appointments. Left message with appointment details and a call back number if patient had any questions or could not accommodate the time we provided.   

## 2022-12-04 NOTE — Telephone Encounter (Signed)
Kelli Horn has these ct's to schedule

## 2022-12-08 ENCOUNTER — Telehealth: Payer: Self-pay | Admitting: Pulmonary Disease

## 2022-12-08 ENCOUNTER — Ambulatory Visit: Payer: Medicare Other | Admitting: Vascular Surgery

## 2022-12-08 ENCOUNTER — Encounter (HOSPITAL_COMMUNITY): Payer: Medicare Other

## 2022-12-08 NOTE — Telephone Encounter (Signed)
Pt called stating that she received information that her CT has been scheduled for Wednesday 5/24. Pt states that Monday, Wednesday, Friday are out for her as she goes with her son to dialysis appointments. States that she can do 8:30am if the scan is able to be done then as she will just need to be home by 11:00 to take him to dialysis.   Pt also stated that she prefers to have the scan done at [facility], not [facility].  Pt also needs to have a follow up appt scheduled with Dr. Francine Graven after the CT. Routing to PCCs.

## 2022-12-08 NOTE — Telephone Encounter (Signed)
Scheduled patient and left her a voicemail with appointment info

## 2022-12-08 NOTE — Telephone Encounter (Signed)
Appointment changed and patient updated.

## 2022-12-10 ENCOUNTER — Ambulatory Visit
Admission: RE | Admit: 2022-12-10 | Discharge: 2022-12-10 | Disposition: A | Discharge status: Home / Self Care | Payer: Medicare Other | Source: Ambulatory Visit | Attending: General Surgery | Admitting: General Surgery

## 2022-12-10 DIAGNOSIS — D0512 Intraductal carcinoma in situ of left breast: Secondary | ICD-10-CM | POA: Diagnosis not present

## 2022-12-10 MED ORDER — GADOPICLENOL 0.5 MMOL/ML IV SOLN
10.0000 mL | Freq: Once | INTRAVENOUS | Status: AC | PRN
  Administered 2022-12-10: 10 mL via INTRAVENOUS

## 2023-01-03 NOTE — Progress Notes (Deleted)
Cardiology Office Note   Date:  01/03/2023   ID:  Kelli Horn, Kelli Horn 08/17/1956, MRN [id number]  PCP:  Loyola Mast, MD  Cardiologist:   Hurshell Dino [place], MD   No chief complaint on file.     History of Present Illness: Kelli Horn is a 67 y.o. female who is seen for follow up  SOB. She has a history of DM, HLD,  and HTN. Also severe PAD but nonobstructive CAD. She was seen in the past by Dr Rennis Golden in 2017. Myoview at that time was normal. Echo showed severe LVH with normal systolic function and normal valves. Ecg was normal.   Other studies of note include plain X rays dating back to 2017 that showed significant aortic atherosclerosis. She recently had Abdominal/pelvic CT on 04/05/20 showing severe bilateral common iliac stenosis. 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 There was medical renal disease. She has an [name] report apparently showing a 2.9 cm gallstone but no gallstone noted on CT.  We evaluated her with cardiac cath showing mild nonobstructive CAD. No significant right subclavian/innominate gradient. Mildly elevated LVEDP. Echo showed mild LVH with normal systolic function. Arterial doppler studies show severe bilateral iliac disease with moderate bilateral reduced ABIs and reduced toe pressures. No significant carotid disease but bilateral subclavian disease noted. She did have PFTs showing severe obstructive disease. Pulmonary evaluation was done who felt she had predominantly asthma. Was started on Trelegy and she noted significant improvement in her breathing. She did have a home sleep study in October showing mild OSA. Being managed by Dr Vassie Loll. She also had colonoscopy in October showing multiple polyps that were removed.   She has also was diagnosed with left breast carcinoma. She underwent bilateral breast lumpectomies on October 8. She then underwent total hysterectomy with BSO for endometrial CA on 07/11/20. She decided  against RT for her breast CA. Followed by Dr Mosetta Putt. 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 No endovascular options. Was seen by [facility]- Dr Chestine Spore. It was felt that surgery would be beneficial but with higher risk due to morbid obesity. Was referred to Weight management to see if she could lose weight to improve her operative risk. She was placed on Rybelsius but unable to tolerate. On his last visit with Dr Chestine Spore in August she had gained 10 lbs and continued conservative therapy recommended unless symptoms progress.   I last saw Kelli Horn about a year ago. More recently she has experienced symptoms of increased SOB. She had PFTs in July which showed some improvement. In Dec she reports she had a lung infection and PNA. Since then her breathing has been bad. She also reports significant breast swelling L>R. She has had mammograms and CT. Plans on having a MRI done and possible breast biopsy. She states her breathing is the worst it has ever been. Denies orthopnea or PND. Describes some swelling in legs but no worse than usual. Weight is down.    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 Sleep study done Jun 20 2020   SOB (shortness of breath)    Swallowing difficulty     Past Surgical History:  Procedure Laterality Date    ABDOMINAL AORTOGRAM W/LOWER EXTREMITY Bilateral 08/08/2020   Procedure: ABDOMINAL AORTOGRAM W/LOWER EXTREMITY;  Surgeon: Runell Gess, MD;  Location: [facility] INVASIVE CV LAB;  Service: Cardiovascular;  Laterality: Bilateral;   APPENDECTOMY     BIOPSY  06/27/2020   Procedure: BIOPSY;  Surgeon: Charlott Rakes, MD;  Location: [facility] ENDOSCOPY;  Service: Endoscopy;;   BREAST LUMPECTOMY WITH RADIOACTIVE SEED AND SENTINEL LYMPH NODE BIOPSY Bilateral 06/07/2020   Procedure: BILATERAL BREAST LUMPECTOMY WITH RADIOACTIVE SEED AND LEFT SENTINEL LYMPH NODE BIOPSY;  Surgeon: Griselda Miner, MD;  Location:  SURGERY CENTER;  Service: General;  Laterality: Bilateral;  PEC BLOCK   COLONOSCOPY WITH PROPOFOL N/A 06/27/2020   Procedure: COLONOSCOPY WITH PROPOFOL;  Surgeon: Charlott Rakes, MD;  Location: [facility];  Service: Endoscopy;  Laterality: N/A;   LEFT HEART CATH AND CORONARY ANGIOGRAPHY N/A 04/23/2020   Procedure: LEFT HEART CATH AND CORONARY ANGIOGRAPHY;  Surgeon: [place], Rasheka Denard M, MD;  Location: [facility] INVASIVE CV LAB;  Service: Cardiovascular;  Laterality: N/A;   POLYPECTOMY  06/27/2020   Procedure: POLYPECTOMY;  Surgeon: Charlott Rakes, MD;  Location: [facility];  Service: Endoscopy;;   ROBOTIC ASSISTED TOTAL HYSTERECTOMY WITH BILATERAL SALPINGO OOPHERECTOMY N/A 07/11/2020   Procedure: XI ROBOTIC ASSISTED TOTAL HYSTERECTOMY WITH BILATERAL SALPINGO OOPHORECTOMY;  Surgeon: Adolphus Birchwood, MD;  Location: [facility];  Service: Gynecology;  Laterality: N/A;     Current Outpatient Medications  Medication Sig Dispense Refill   albuterol (PROVENTIL) (2.5 MG/3ML) 0.083% nebulizer solution Take 3 mLs (2.5 mg total) by nebulization every 6 (six) hours as needed for wheezing or shortness of breath. 75 mL 5   albuterol (VENTOLIN HFA) 108 (90 Base) MCG/ACT inhaler Inhale 2 puffs into the lungs every 4 (four) hours as needed for wheezing or shortness of breath. 8 g 3   aspirin EC 81 MG tablet Take 81 mg by mouth  daily. Swallow whole.     cetirizine (ZYRTEC) 10 MG tablet Take 10 mg by mouth daily.     enalapril (VASOTEC) 10 MG tablet Take 20 mg by mouth 2 (two) times daily.     famotidine (PEPCID) 20 MG tablet Take 20 mg by mouth at bedtime.     Fluticasone-Umeclidin-Vilant (TRELEGY ELLIPTA) 100-62.5-25 MCG/ACT AEPB Inhale 1 puff into the lungs daily. 1 each 5   hydrOXYzine (VISTARIL) 25 MG capsule Take 1 capsule (25 mg total) by mouth 4 (four) times daily as needed. 30 capsule 5   loratadine (CLARITIN) 10 MG tablet Take 10 mg by mouth daily as needed.     LORazepam (ATIVAN) 1 MG tablet Take by mouth.     Polyethyl Glycol-Propyl Glycol 0.4-0.3 % SOLN Place 1 drop into both eyes 3 (three) times daily as needed (dry eyes).      Semaglutide,0.25 or 0.5MG /DOS, (OZEMPIC, 0.25 OR 0.5 MG/DOSE,) 2 MG/3ML SOPN Inject 0.25 mg into the skin once a week. Increase  to 0.5 mg weekly after 28 days. Continue that dose for 28 days. 3 mL 1   No current facility-administered medications for this visit.    Allergies:   Beta adrenergic blockers, Carvedilol, Peanut-containing drug products, Crestor [rosuvastatin], Iodinated contrast media, Metformin and related, Statins, and Zocor [simvastatin]    Social History:  The patient  reports that she quit smoking about 18 years ago. Her smoking use included cigarettes. She has a 20.00 pack-year smoking history. She has never used smokeless tobacco. She reports that she does not drink alcohol and does not use drugs.   Family History:  The patient's family history includes AAA (abdominal aortic aneurysm) in her father; Aneurysm in her father and paternal uncle; Breast cancer in her maternal aunt; Diabetes in her father and another family member; Heart attack (age of onset: 50) in her father; Heart disease in her paternal uncle; High Cholesterol in her father; Hypertension in her father; Lung cancer in her mother; Obesity in her father; Schizophrenia in her mother; Stroke in her father.     ROS:  Please see the history of present illness.   Otherwise, review of systems are negative.   All other systems are reviewed and negative.    PHYSICAL EXAM: VS:  LMP 12/09/2013  , BMI There is no height or weight on file to calculate BMI. GEN: Well nourished, morbidly obese, in no acute distress  HEENT: normal  Neck: no JVD,  Left carotid and bilateral subclavian bruits.  Cardiac: RRR; no murmurs, rubs, or gallops,no edema. Femoral and pedal pulses are not palpable.  Breasts are large and pendulous.  Respiratory:  clear to auscultation bilaterally, normal work of breathing GI: morbidly obese with large panus. Soft, nontender, nondistended, + BS. I don't hear renal bruits MS: no deformity or atrophy  Skin: warm and dry, no rash Neuro:  Strength and sensation are intact Psych: euthymic mood, full affect   EKG:  EKG is   ordered today. NSR rate 95. Low voltage. Otherwise normal. .I have personally reviewed and interpreted this study.     Recent Labs: 08/15/2022: ALT 30; B Natriuretic Peptide 234.0 10/16/2022: Hemoglobin 13.2; Platelets 441.0 11/12/2022: BUN 13; Creatinine, Ser 1.00; Potassium 4.0; Sodium 139    Lipid Panel    Component Value Date/Time   CHOL 225 (H) 11/12/2022 1115   CHOL 196 04/19/2020 1144   TRIG 134.0 11/12/2022 1115   HDL 45.20 11/12/2022 1115   HDL 35 (L) 04/19/2020 1144   CHOLHDL 5 11/12/2022 1115   VLDL 26.8 11/12/2022 1115   LDLCALC 153 (H) 11/12/2022 1115   LDLCALC 133 (H) 04/19/2020 1144   Additional labs from Dr Jeannetta Nap: dated 02/07/20: D dimer normal. BNP 54. Glucose 118. UA 7.4. otherwise CMET normal. CBC normal Hgb 14.7. TFTS normal. Cholesterol 207, triglycerides 137, HDL 40, LDL 142.  Urinalysis normal.  Wt Readings from Last 3 Encounters:  11/12/22 (!) 331 lb 12.8 oz (150.5 kg)  10/27/22 (!) 325 lb 12.8 oz (147.8 kg)  10/26/22 (!) 334 lb (151.5 kg)      Other studies Reviewed: Additional studies/ records that were reviewed today  include:   Myoview 12/26/15: Study Highlights  Nuclear stress EF: 62%. There was no ST segment deviation noted during stress. No T wave inversion was noted during stress. The study is normal. This is a low risk study. The left ventricular ejection fraction is normal (55-65%).   Echo 12/11/15: Study Conclusions   - Left ventricle: The cavity size was normal. Wall thickness  was    increased in a pattern of severe LVH. Systolic function was    normal. The estimated ejection fraction was in the range of 60%    to 65%. Doppler parameters are consistent with abnormal left    ventricular relaxation (grade 1 diastolic dysfunction). The E/e&'    ratio is between 8-15, suggesting indeterminate LV filling    pressure.  - Aortic valve: Trileaflet. Sclerosis without stenosis. There was    no regurgitation.  - Mitral valve: Calcified annulus. There was trivial regurgitation.  - Left atrium: The atrium was mildly dilated.  - Tricuspid valve: There was trivial regurgitation.  - Pulmonary arteries: PA peak pressure: 32 mm Hg (S).  - Inferior vena cava: The vessel was dilated. The respirophasic    diameter changes were blunted (< 50%), consistent with elevated    central venous pressure.   Cardiac cath 04/23/20:  LEFT HEART CATH AND CORONARY ANGIOGRAPHY  Conclusion    Mid RCA lesion is 25% stenosed. Mid LAD lesion is 25% stenosed. The left ventricular systolic function is normal. LV end diastolic pressure is mildly elevated. The left ventricular ejection fraction is greater than 65% by visual estimate.   1. Nonobstructive CAD 2. Normal LV function 3. Mildly elevated LVEDP 4. Hypertensive 5. No significant right innominate or subclavian pressure gradient.    Plan: will optimize BP control. Will arrange PFTs, Echo, Carotid and LE arterial dopplers. She is intolerant of beta blockers. Will initiate amlodipine in addition to enalapril for BP control.   Echo 04/30/20: IMPRESSIONS     1. Left  ventricular ejection fraction, by estimation, is 55 to 60%. The  left ventricle has normal function. The left ventricle has no regional  wall motion abnormalities. There is mild concentric left ventricular  hypertrophy. Left ventricular diastolic  parameters are indeterminate. Elevated left ventricular end-diastolic  pressure.   2. Right ventricular systolic function is normal. The right ventricular  size is normal. Tricuspid regurgitation signal is inadequate for assessing  PA pressure.   3. Left atrial size was mildly dilated.   4. Right atrial size was mildly dilated.   5. The mitral valve is normal in structure. Trivial mitral valve  regurgitation. No evidence of mitral stenosis. Moderate mitral annular  calcification.   6. The aortic valve is tricuspid. There is mild calcification of the  aortic valve. There is mild thickening of the aortic valve. Aortic valve  regurgitation is not visualized. Mild aortic valve sclerosis is present,  with no evidence of aortic valve  stenosis.   7. The inferior vena cava is normal in size with greater than 50%  respiratory variability, suggesting right atrial pressure of 3 mmHg.   Conclusion(s)/Recommendation(s): Otherwise normal echocardiogram, with  minor abnormalities described in the report  LE arterial dopplers:  Aorta-Iliacs: Technically challenging and limited evaluation.   Monophasic waveforms in the bilateral external iliac arteries suggest  severe stenosis and/or occlusion of the bilateral common iliac arteries.  Unable to directly visualize the common iliac arteries due to patient body  habitus and overlying bowel gas.   Right: Near normal infrainguinal examination. No evidence of focal  stenosis.   Left: Near normal infrainguinal examination. No evidence of focal  stenosis.   Summary:  Right: Resting right ankle-brachial index indicates moderate right lower  extremity arterial disease. The right toe-brachial index is  abnormal.   Left: Resting left ankle-brachial index indicates moderate left lower  extremity arterial disease. The left toe-brachial index is abnormal.   Carotid  dopplers 05/16/20: Summary:  Right Carotid: Velocities in the right ICA are consistent with a 1-39%  stenosis.   Left Carotid: Velocities in the left ICA are consistent with a 40-59%  stenosis.   Vertebrals:  Left vertebral artery demonstrates antegrade flow. Right  vertebral               artery demonstrates no discernable flow.  Subclavians: Left subclavian artery was stenotic. Right subclavian artery  flow               was disturbed.   *See table(s) above for measurements and observations.  Suggest follow up study in 12 months.   Echo 11/19/22: IMPRESSIONS     1. Left ventricular ejection fraction, by estimation, is 60 to 65%. The  left ventricle has normal function. The left ventricle has no regional  wall motion abnormalities. The left ventricular internal cavity size was  mildly dilated. Left ventricular  diastolic parameters are consistent with Grade I diastolic dysfunction  (impaired relaxation). Elevated left atrial pressure.   2. Right ventricular systolic function is normal. The right ventricular  size is normal.   3. The mitral valve is normal in structure. Mild mitral valve  regurgitation. No evidence of mitral stenosis.   4. The aortic valve is tricuspid. Aortic valve regurgitation is not  visualized. Aortic valve sclerosis/calcification is present, without any  evidence of aortic stenosis.   ASSESSMENT AND PLAN:  1.  SOB. I still think this is mostly  secondary to severe asthma. When I saw her initially in 2019 her breathing was much worse. Echo at that time was normal. Cardiac cath showed mild nonobstructive CAD and mildly elevated LVEDP 17 mm Hg. Recent CXR/CT did not show significant edema. She does have some coronary calcification which is not surprising. Her symptoms appear to have worsened since  she had an infection in December. Her BNP is mildly elevated but lower than it was 6 years ago. I don't think she has significant pitting edema on exam. We will start with an Echo. If this is normal again then the only other consideration would be to do a right heart cath to look at filling pressures and pulmonary pressures.  2. HTN. Under reasonable control. Patient reports she was prescribed losartan but she has actually continued taking enalapril.  3. HLD. Reports intolerance of statins.  Goal LDL is < 70 with her severe PAD. Will need to consider alternative lipid lowering therapy once acute issues taken care of.  4. Morbid obesity.  5. PAD. 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 Some bilateral subclavian disease but no gradient on the right with cath. Seen by [facility]. Stable claudication. Bypass surgery high risk due to co morbidities.  6. Strong family history of premature vascular disease. 7. Former tobacco abuse.  8. Multiple colon polyps. S/p removal 9. Left breast carcinoma. S/p bilateral lumpectomies. Now with significant breast enlargement/tenderness. Awaiting breast MRI. May need biopsy. 10. Nonobstructive CAD.  11. Endometrial CA stage 1A s/p total hysterectomy/BSO 12.  OSA per pulmonary.   Current medicines are reviewed at length with the patient today.  The patient does not have concerns regarding medicines.  The following changes have been made:  See above  Labs/ tests ordered today include:   No orders of the defined types were placed in this encounter.   Disposition:   FU depending on results of Echo.  Signed, Kele Barthelemy [place], MD  01/03/2023 7:50 AM  [facility] [address], [place], [place], [address] Phone [phone], Fax [phone]

## 2023-01-04 ENCOUNTER — Ambulatory Visit: Payer: 59 | Admitting: Hematology

## 2023-01-05 ENCOUNTER — Inpatient Hospital Stay: Payer: Medicare Other | Attending: Hematology | Admitting: Hematology

## 2023-01-05 NOTE — Progress Notes (Deleted)
[facility]   [phone] [phone]   Clinic Follow up Note   Patient Care Team: Loyola Mast, MD as PCP - General (Family Medicine) Pershing Proud, RN as Oncology Nurse Navigator Donnelly Angelica, RN as Oncology Nurse Navigator Griselda Miner, MD as Consulting Physician (General Surgery) Malachy Mood, MD as Consulting Physician (Hematology) Lonie Peak, MD as Attending Physician (Radiation Oncology) Adolphus Birchwood, MD as Consulting Physician (Gynecologic Oncology) [place], Peter M, MD as Consulting Physician (Cardiology) [place] Nickel, DO as Consulting Physician (Bariatrics) Glenford Bayley, NP as Nurse Practitioner (Pulmonary Disease)  Date of Service:  01/05/2023  CHIEF COMPLAINT: f/u of  f/u of Left Breast  Cancer DCIS   CURRENT THERAPY:  Surveillance   ASSESSMENT: *** Kelli Horn is a 67 y.o. female with   No problem-specific Assessment & Plan notes found for this encounter.  ***   PLAN:     SUMMARY OF ONCOLOGIC HISTORY: Oncology History Overview Note  Cancer Staging Ductal carcinoma in situ (DCIS) of left breast Staging form: Breast, AJCC 8th Edition - Clinical stage from 05/22/2020: Stage 0 (cTis (DCIS), cN0, cM0, ER+, PR+) - Unsigned    Ductal carcinoma in situ (DCIS) of left breast  04/26/2020 Mammogram   IMPRESSION: 1. There is a possible subtle distortion in the lateral right breast without a definite sonographic correlate.   2.  No evidence of right axillary lymphadenopathy.   3. There are 2 adjacent abutting masses in the left breast at 3:30, 7cmfn which are indeterminate. One of these is an irregular mass measuring 9 x 3 x 5 mm. An adjacent abutting mass measures 4 x 3 x 4 mm.   4.  No evidence of left axillary lymphadenopathy.     05/10/2020 Initial Biopsy   Diagnosis 1. Breast, left, needle core biopsy, 3:30 o'clock - CARCINOMA INVOLVING PAPILLARY LESION; SEE COMMENT. 2. Breast, right,  needle core biopsy, lower, outer quadrant - COMPLEX SCLEROSING LESION WITH CALCIFICATIONS. Microscopic Comment 1. The differential diagnosis includes invasive papillary carcinoma and solid papillary carcinoma. Immunohistochemistry for E-cadherin is positive. CK5/6 is negative. SMM-1 is positive, Calponin is equivocal and P63 is negative.   1. PROGNOSTIC INDICATORS Results: IMMUNOHISTOCHEMICAL AND MORPHOMETRIC ANALYSIS PERFORMED MANUALLY Estrogen Receptor: 100%, POSITIVE, STRONG STAINING INTENSITY Progesterone Receptor: 100%, POSITIVE, STRONG STAINING INTENSITY   05/17/2020 Initial Diagnosis   Ductal carcinoma in situ (DCIS) of left breast   06/07/2020 Surgery   BILATERAL BREAST LUMPECTOMY WITH RADIOACTIVE SEED AND LEFT SENTINEL LYMPH NODE BIOPSY by Dr Carolynne Edouard   06/07/2020 Pathology Results   FINAL MICROSCOPIC DIAGNOSIS:   A. 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  - Margins of resection are not involved (Closest margin: 2 mm, medial).  - Biopsy site.  - See oncology table.   B. BREAST, RIGHT, LUMPECTOMY:  - Fibrocystic change.  - Biopsy site.   C. SENTINEL LYMPH NODE, LEFT #1, BIOPSY:  - One lymph node, negative for carcinoma (0/1).    Endometrial cancer (HCC)  06/27/2020 Procedure   Colonoscopy by Dr Bosie Clos  IMPRESSION Preparation of the colon was fair. - One 7 mm polyp in the cecum, removed with a hot snare. Resected and retrieved. - Seven 3 to 7 mm polyps in the sigmoid colon, removed with a hot snare. Resected and retrieved. - One 9 mm polyp in the descending colon, removed with a hot snare. Resected and retrieved. - Three 1 to 4 mm polyps in the  sigmoid colon, removed with a cold biopsy forceps. Resected and retrieved. - Two 3 to 7 mm polyps in the rectum, removed with a hot snare. Resected and retrieved. - Diverticulosis in the sigmoid colon. - Medium-sized lipoma in the ascending colon. - Internal  hemorrhoids. - Non-thrombosed external hemorrhoids and hypertrophied anal papilla(e) found on perianal exam.    FINAL MICROSCOPIC DIAGNOSIS:   A. COLON, CECUM/SIGMOID, POLYPECTOMY:  - Tubular adenoma without high-grade dysplasia or malignancy  - Hyperplastic polyp(s)   B. COLON, DESCENDING, POLYPECTOMY:  - Hyperplastic polyp   C. COLON, SIGMOID, POLYPECTOMY:  - Hyperplastic polyp(s)   D. RECTUM, POLYPECTOMY:  - Hyperplastic polyp(s)    07/11/2020 Surgery   XI ROBOTIC ASSISTED TOTAL HYSTERECTOMY WITH BILATERAL SALPINGO OOPHORECTOMY by Dr Andrey Farmer   07/11/2020 Initial Diagnosis   Endometrial cancer (HCC)   07/11/2020 Initial Biopsy   FINAL MICROSCOPIC DIAGNOSIS:   A. UTERUS, CERVIX, BILATERAL FALLOPIAN TUBES AND OVARIES:  Uterus:  -  Endometrioid carcinoma, FIGO grade 1, partially involving endometrial  polyp  -  Leiomyoma (4.0 cm; largest)  -  See oncology table and comment below   Cervix:  -  Benign endocervical polyp  -  No carcinoma identified   Bilateral Ovaries:  -  No carcinoma identified   Bilateral Fallopian tubes:  -  No carcinoma identified    Mismatch Repair Protein (IHC)   SUMMARY INTERPRETATION: NORMAL    IHC EXPRESSION RESULTS   TEST           RESULT  MLH1:          Preserved nuclear expression  MSH2:          Preserved nuclear expression  MSH6:          Preserved nuclear expression  PMS2:          Preserved nuclear expression       INTERVAL HISTORY: *** Kelli Horn is here for a follow up of Left breast DCIS. She was last seen by me on 01/15/2022. She presents to the clinic    All other systems were reviewed with the patient and are negative.  MEDICAL HISTORY:  Past Medical History:  Diagnosis Date   Acid reflux    Anxiety    Asthma    Atherosclerosis    Back pain    Breast cancer (HCC) 05/2020   left breast DCIS   Chest pain    Chronic kidney disease    Right side question infarction   Constipation    COPD  (chronic obstructive pulmonary disease) (HCC)    Diabetes mellitus without complication (HCC)    no meds   Edema of both lower extremities    GERD (gastroesophageal reflux disease)    Hyperlipidemia    Hypertension    Joint pain    Kidney problem    Obesity    PAD (peripheral artery disease) (HCC)    Peripheral vascular disease (HCC)    severe athrosclerosis   Sleep apnea     no cpap at this time. Sleep study done Jun 20 2020   SOB (shortness of breath)    Swallowing difficulty     SURGICAL HISTORY: Past Surgical History:  Procedure Laterality Date   ABDOMINAL AORTOGRAM W/LOWER EXTREMITY Bilateral 08/08/2020   Procedure: ABDOMINAL AORTOGRAM W/LOWER EXTREMITY;  Surgeon: Runell Gess, MD;  Location: [facility];  Service: Cardiovascular;  Laterality: Bilateral;   APPENDECTOMY     BIOPSY  06/27/2020   Procedure: BIOPSY;  Surgeon: Bosie Clos,  Oswaldo Done, MD;  Location: [facility];  Service: Endoscopy;;   BREAST LUMPECTOMY WITH RADIOACTIVE SEED AND SENTINEL LYMPH NODE BIOPSY Bilateral 06/07/2020   Procedure: BILATERAL BREAST LUMPECTOMY WITH RADIOACTIVE SEED AND LEFT SENTINEL LYMPH NODE BIOPSY;  Surgeon: Griselda Miner, MD;  Location: [facility];  Service: General;  Laterality: Bilateral;  PEC BLOCK   COLONOSCOPY WITH PROPOFOL N/A 06/27/2020   Procedure: COLONOSCOPY WITH PROPOFOL;  Surgeon: Charlott Rakes, MD;  Location: [facility];  Service: Endoscopy;  Laterality: N/A;   LEFT HEART CATH AND CORONARY ANGIOGRAPHY N/A 04/23/2020   Procedure: LEFT HEART CATH AND CORONARY ANGIOGRAPHY;  Surgeon: [place], Peter M, MD;  Location: [facility];  Service: Cardiovascular;  Laterality: N/A;   POLYPECTOMY  06/27/2020   Procedure: POLYPECTOMY;  Surgeon: Charlott Rakes, MD;  Location: [facility];  Service: Endoscopy;;   ROBOTIC ASSISTED TOTAL HYSTERECTOMY WITH BILATERAL SALPINGO OOPHERECTOMY N/A 07/11/2020   Procedure: XI ROBOTIC ASSISTED TOTAL HYSTERECTOMY WITH  BILATERAL SALPINGO OOPHORECTOMY;  Surgeon: Adolphus Birchwood, MD;  Location: [facility];  Service: Gynecology;  Laterality: N/A;    I have reviewed the social history and family history with the patient and they are unchanged from previous note.  ALLERGIES:  is allergic to beta adrenergic blockers, carvedilol, peanut-containing drug products, crestor [rosuvastatin], iodinated contrast media, metformin and related, statins, and zocor [simvastatin].  MEDICATIONS:  Current Outpatient Medications  Medication Sig Dispense Refill   albuterol (PROVENTIL) (2.5 MG/3ML) 0.083% nebulizer solution Take 3 mLs (2.5 mg total) by nebulization every 6 (six) hours as needed for wheezing or shortness of breath. 75 mL 5   albuterol (VENTOLIN HFA) 108 (90 Base) MCG/ACT inhaler Inhale 2 puffs into the lungs every 4 (four) hours as needed for wheezing or shortness of breath. 8 g 3   aspirin EC 81 MG tablet Take 81 mg by mouth daily. Swallow whole.     cetirizine (ZYRTEC) 10 MG tablet Take 10 mg by mouth daily.     enalapril (VASOTEC) 10 MG tablet Take 20 mg by mouth 2 (two) times daily.     famotidine (PEPCID) 20 MG tablet Take 20 mg by mouth at bedtime.     Fluticasone-Umeclidin-Vilant (TRELEGY ELLIPTA) 100-62.5-25 MCG/ACT AEPB Inhale 1 puff into the lungs daily. 1 each 5   hydrOXYzine (VISTARIL) 25 MG capsule Take 1 capsule (25 mg total) by mouth 4 (four) times daily as needed. 30 capsule 5   loratadine (CLARITIN) 10 MG tablet Take 10 mg by mouth daily as needed.     LORazepam (ATIVAN) 1 MG tablet Take by mouth.     Polyethyl Glycol-Propyl Glycol 0.4-0.3 % SOLN Place 1 drop into both eyes 3 (three) times daily as needed (dry eyes).      Semaglutide,0.25 or 0.5MG /DOS, (OZEMPIC, 0.25 OR 0.5 MG/DOSE,) 2 MG/3ML SOPN Inject 0.25 mg into the skin once a week. Increase to 0.5 mg weekly after 28 days. Continue that dose for 28 days. 3 mL 1   No current facility-administered medications for this visit.    PHYSICAL  EXAMINATION: ECOG PERFORMANCE STATUS: {CHL ONC ECOG PS:[id number]}  There were no vitals filed for this visit. Wt Readings from Last 3 Encounters:  11/12/22 (!) 331 lb 12.8 oz (150.5 kg)  10/27/22 (!) 325 lb 12.8 oz (147.8 kg)  10/26/22 (!) 334 lb (151.5 kg)    {Only keep what was examined. If exam not performed, can use .CEXAM } GENERAL:alert, no distress and comfortable SKIN: skin color, texture, turgor are normal, no rashes or  significant lesions EYES: normal, Conjunctiva are pink and non-injected, sclera clear {OROPHARYNX:no exudate, no erythema and lips, buccal mucosa, and tongue normal}  NECK: supple, thyroid normal size, non-tender, without nodularity LYMPH:  no palpable lymphadenopathy in the cervical, axillary {or inguinal} LUNGS: clear to auscultation and percussion with normal breathing effort HEART: regular rate & rhythm and no murmurs and no lower extremity edema ABDOMEN:abdomen soft, non-tender and normal bowel sounds Musculoskeletal:no cyanosis of digits and no clubbing  NEURO: alert & oriented x 3 with fluent speech, no focal motor/sensory deficits  LABORATORY DATA:  I have reviewed the data as listed    Latest Ref Rng & Units 10/16/2022    9:31 AM 08/15/2022    8:56 PM 08/02/2020    4:35 PM  CBC  WBC 4.0 - 10.5 K/uL 11.5  13.3  13.8   Hemoglobin 12.0 - 15.0 g/dL 78.2  95.6  21.3   Hematocrit 36.0 - 46.0 % 39.4  44.8  39.3   Platelets 150.0 - 400.0 K/uL 441.0  476  466         Latest Ref Rng & Units 11/12/2022   11:15 AM 10/16/2022    9:27 AM 08/15/2022    8:56 PM  CMP  Glucose 70 - 99 mg/dL 086  578  469   BUN 6 - 23 mg/dL 13  17  11    Creatinine 0.40 - 1.20 mg/dL 6.29  5.28  4.13   Sodium 135 - 145 mEq/L 139  136  137   Potassium 3.5 - 5.1 mEq/L 4.0  4.3  4.3   Chloride 96 - 112 mEq/L 101  100  104   CO2 19 - 32 mEq/L 28  27  23    Calcium 8.4 - 10.5 mg/dL 9.1  24.4  9.2   Total Protein 6.5 - 8.1 g/dL   8.4   Total Bilirubin 0.3 - 1.2 mg/dL   0.7    Alkaline Phos 38 - 126 U/L   80   AST 15 - 41 U/L   30   ALT 0 - 44 U/L   30       RADIOGRAPHIC STUDIES: I have personally reviewed the radiological images as listed and agreed with the findings in the report. No results found.    No orders of the defined types were placed in this encounter.  All questions were answered. The patient knows to call the clinic with any problems, questions or concerns. No barriers to learning was detected. The total time spent in the appointment was {CHL ONC TIME VISIT - [id number]}.     Salome Holmes, CMA 01/05/2023   I, Monica Martinez, CMA, am acting as scribe for Malachy Mood, MD.   {Add scribe attestation statement}

## 2023-01-05 NOTE — Assessment & Plan Note (Deleted)
-  s/p b/l lumpectomies with Dr Carolynne Edouard on 06/07/20 for benign breast lesions. Surgical path showed: left breast-- 1.3cm solid papillary carcinoma without invasive carcinoma, with clear margins; right breast-- benign. -she declined Adjuvant Radiation and prophylactic antiestrogens. -most recent mammogram 05/13/21 was benign. -she is clinically doing well from a breast cancer standpoint. -Will f/u with her in 1 year.

## 2023-01-07 ENCOUNTER — Ambulatory Visit: Payer: Medicare Other | Admitting: Cardiology

## 2023-01-12 ENCOUNTER — Ambulatory Visit: Payer: Medicare Other | Admitting: Family Medicine

## 2023-01-21 ENCOUNTER — Ambulatory Visit (HOSPITAL_COMMUNITY)
Admission: RE | Admit: 2023-01-21 | Discharge: 2023-01-21 | Disposition: A | Discharge status: Home / Self Care | Payer: Medicare Other | Source: Ambulatory Visit | Attending: Pulmonary Disease | Admitting: Pulmonary Disease

## 2023-01-21 DIAGNOSIS — R911 Solitary pulmonary nodule: Secondary | ICD-10-CM | POA: Insufficient documentation

## 2023-01-21 DIAGNOSIS — J439 Emphysema, unspecified: Secondary | ICD-10-CM | POA: Diagnosis not present

## 2023-01-21 DIAGNOSIS — R918 Other nonspecific abnormal finding of lung field: Secondary | ICD-10-CM | POA: Diagnosis not present

## 2023-01-22 ENCOUNTER — Ambulatory Visit (HOSPITAL_COMMUNITY): Payer: Medicare Other

## 2023-01-28 ENCOUNTER — Ambulatory Visit: Payer: Medicare Other | Admitting: Pulmonary Disease

## 2023-01-28 ENCOUNTER — Telehealth: Payer: Self-pay | Admitting: Pulmonary Disease

## 2023-01-28 ENCOUNTER — Encounter: Payer: Self-pay | Admitting: Pulmonary Disease

## 2023-01-28 VITALS — BP 126/82 | HR 88 | Ht 70.0 in | Wt 329.0 lb

## 2023-01-28 DIAGNOSIS — R911 Solitary pulmonary nodule: Secondary | ICD-10-CM

## 2023-01-28 DIAGNOSIS — J4489 Other specified chronic obstructive pulmonary disease: Secondary | ICD-10-CM | POA: Diagnosis not present

## 2023-01-28 NOTE — Patient Instructions (Signed)
Continue trelegy ellipta 1 puff daily - rinse mouth after each use  Recommend moving forward with left breast skin biopsy  We will check a CT Chest scan in 6 months to monitor your lung nodule  Follow up in 6 months after CT Chest scan

## 2023-01-28 NOTE — Telephone Encounter (Signed)
Cheryl from [facility] call in regards to CT. Impression has been posted below   IMPRESSION: 1. Persistent 15 mm sub solid right lower lobe pulmonary nodule, with 7 mm solid component. Given the persistence and size of the solid component, this nodule is worrisome for adenocarcinoma. Tissue sampling is recommended. This recommendation follows the consensus statement: Guidelines for Management of Incidental Pulmonary Nodules Detected on CT Images: From the Fleischner Society 2017; Radiology 2017; 284:228-243. 2. Aortic Atherosclerosis (ICD10-I70.0) and Emphysema (ICD10-J43.9).

## 2023-01-28 NOTE — Telephone Encounter (Signed)
Reviewed with patient in clinic today

## 2023-01-28 NOTE — Telephone Encounter (Signed)
Call Report  

## 2023-01-28 NOTE — Progress Notes (Signed)
Synopsis: Referred in February 2024 for COPD with asthma by Herbie Drape, MD  Subjective:   PATIENT ID: Kelli Horn, Kelli Horn, Kelli Horn  HPI  Chief Complaint  Patient presents with   Follow-up    F/U after CT scan. States her breathing has been stable since last visit.    Kelli Horn is a 67 year old woman, former smoker with history of asthma-COPD overlap syndrome, sleep apnea intolerant to CPAP, history of breast cancer and endometrial cancer, severe peripheral vascular disease and diabetes who returns to pulmonary clinic for follow up.    OV 10/26/22 She was seen 10/16/22 by Rubye Oaks, NP and treated for exacerbation with prednisone and antibiotics. She was started on prilosec 20mg  daily and pepcid at betime for cough due to reflux. She doesn't feel much better since that visit.  CT Chest 10/26/21 shows moderate centrilobular and paraseptal emphysematous changes, upper lung predominant and 15mm irregular/subsolid nodule with an 8mm solid component of superior segment of RLL.   Absolute Eos 800 on 10/16/22  She reports being sick over the past 3 months which started with a respiratory viral illness. She has persistent dyspnea. She has palpitations and lower extremity edema. She denies wheezing or cough. She reports swelling of her breasts with thickening of the skin.   She has visit with Dr. [place] of [facility] tomorrow.   OV 01/28/23 - Today She is without acute respiratory symptoms and her breathing has been stable since last visit.   CT Chest scan 01/21/23 shows stable RLL mixed solid groundglass nodule compared to 10/2022.    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 Sleep study done Jun 20 2020   SOB (shortness of breath)    Swallowing difficulty      Family History  Problem Relation Age of Onset   Lung cancer Mother    Schizophrenia Mother    Heart attack Father 33   Aneurysm Father    Diabetes Father    Hypertension Father    High Cholesterol Father    Stroke Father    Obesity Father    AAA (abdominal aortic aneurysm) Father    Breast cancer Maternal Aunt    Heart disease Paternal Uncle    Aneurysm Paternal Uncle    Diabetes Other    Colon cancer Neg Hx    Cancer - Ovarian Neg Hx    Endometrial cancer Neg Hx    Pancreatic cancer Neg Hx    Prostate cancer Neg Hx      Social History   Socioeconomic History   Marital status: Married    Spouse name: Not on file  Number of children: 2   Years of education: Not on file   Highest education level: Not on file  Occupational History   Occupation: retired , 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  Tobacco Use   Smoking status: Former    Packs/day: 2.00    Years: 10.00    Additional pack years: 0.00    Total pack years: 20.00    Types: Cigarettes    Quit date: 04/16/2004    Years since quitting: 18.8   Smokeless tobacco: Never  Vaping Use   Vaping Use: Never used  Substance and Sexual Activity   Alcohol use: No   Drug use: No   Sexual activity: Not Currently  Other Topics Concern   Not on file  Social History Narrative   Not on file   Social Determinants of Health   Financial Resource Strain: Medium Risk (01/01/2022)   Overall Financial Resource Strain (CARDIA)    Difficulty of Paying Living Expenses: Somewhat hard  Food  Insecurity: Not on file  Transportation Needs: Not on file  Physical Activity: Not on file  Stress: Not on file  Social Connections: Not on file  Intimate Partner Violence: Not on file     Allergies  Allergen Reactions   Beta Adrenergic Blockers Other (See Comments)    Trigger Asthma   Carvedilol Shortness Of Breath    Reacts with albuterol, worsens asthma symptoms when taken together    Peanut-Containing Drug Products Other (See Comments)     Trigger Asthma attack   Crestor [Rosuvastatin]     myalgias   Iodinated Contrast Media Other (See Comments)    Pt states that she has never had any reaction what so ever to contrast.    Metformin And Related Other (See Comments)    Epigastric pain, GI cramping.   Statins Other (See Comments)   Zocor [Simvastatin]     myalgias     Outpatient Medications Prior to Visit  Medication Sig Dispense Refill   albuterol (PROVENTIL) (2.5 MG/3ML) 0.083% nebulizer solution Take 3 mLs (2.5 mg total) by nebulization every 6 (six) hours as needed for wheezing or shortness of breath. 75 mL 5   albuterol (VENTOLIN HFA) 108 (90 Base) MCG/ACT inhaler Inhale 2 puffs into the lungs every 4 (four) hours as needed for wheezing or shortness of breath. 8 g 3   aspirin EC 81 MG tablet Take 81 mg by mouth daily. Swallow whole.     cetirizine (ZYRTEC) 10 MG tablet Take 10 mg by mouth daily.     enalapril (VASOTEC) 10 MG tablet Take 20 mg by mouth 2 (two) times daily.     Fluticasone-Umeclidin-Vilant (TRELEGY ELLIPTA) 100-62.5-25 MCG/ACT AEPB Inhale 1 puff into the lungs daily. 1 each 5   hydrOXYzine (VISTARIL) 25 MG capsule Take 1 capsule (25 mg total) by mouth 4 (four) times daily as needed. 30 capsule 5   Polyethyl Glycol-Propyl Glycol 0.4-0.3 % SOLN Place 1 drop into both eyes 3 (three) times daily as needed (dry eyes).      famotidine (PEPCID) 20 MG tablet Take 20 mg by mouth at bedtime.     loratadine (CLARITIN) 10 MG tablet Take 10 mg by mouth daily as needed.      LORazepam (ATIVAN) 1 MG tablet Take by mouth.     Semaglutide,0.25 or 0.5MG /DOS, (OZEMPIC, 0.25 OR 0.5 MG/DOSE,) 2 MG/3ML SOPN Inject 0.25 mg into the skin once a week. Increase to 0.5 mg weekly after 28 days. Continue that dose for 28  days. 3 mL 1   No facility-administered medications prior to visit.   Review of Systems  Constitutional:  Negative for chills, fever, malaise/fatigue and weight loss.  HENT:  Negative for congestion, sinus pain and sore throat.   Eyes: Negative.   Respiratory:  Negative for cough, hemoptysis, sputum production, shortness of breath and wheezing.   Cardiovascular:  Positive for leg swelling. Negative for chest pain, palpitations, orthopnea and claudication.  Gastrointestinal:  Negative for abdominal pain, heartburn, nausea and vomiting.  Genitourinary: Negative.   Musculoskeletal:  Negative for joint pain and myalgias.  Skin:  Negative for rash.  Neurological:  Negative for weakness.  Endo/Heme/Allergies: Negative.   Psychiatric/Behavioral: Negative.     Objective:   Vitals:   01/28/23 1458  BP: 126/82  Pulse: 88  SpO2: 98%  Weight: (!) 329 lb (149.2 kg)  Height: 5\' 10"  (1.778 m)   Physical Exam Constitutional:      General: She is not in acute distress.    Appearance: She is obese. She is not ill-appearing.  HENT:     Head: Normocephalic and atraumatic.  Eyes:     General: No scleral icterus.    Conjunctiva/sclera: Conjunctivae normal.  Cardiovascular:     Rate and Rhythm: Normal rate and regular rhythm.     Pulses: Normal pulses.     Heart sounds: Normal heart sounds. No murmur heard. Pulmonary:     Effort: Pulmonary effort is normal.     Breath sounds: Normal breath sounds. No wheezing, rhonchi or rales.  Abdominal:     General: Bowel sounds are normal.     Palpations: Abdomen is soft.  Musculoskeletal:     Right lower leg: No edema.     Left lower leg: No edema.  Skin:    General: Skin is warm and dry.  Neurological:      General: No focal deficit present.     Mental Status: She is alert.    CBC    Component Value Date/Time   WBC 11.5 (H) 10/16/2022 0931   RBC 4.88 10/16/2022 0931   HGB 13.2 10/16/2022 0931   HGB 13.0 08/02/2020 1635   HCT 39.4 10/16/2022 0931   HCT 39.3 08/02/2020 1635   PLT 441.0 (H) 10/16/2022 0931   PLT 466 (H) 08/02/2020 1635   MCV 80.7 10/16/2022 0931   MCV 81 08/02/2020 1635   MCH 25.7 (L) 08/15/2022 2056   MCHC 33.5 10/16/2022 0931   RDW 15.7 (H) 10/16/2022 0931   RDW 12.4 08/02/2020 1635   LYMPHSABS 3.0 10/16/2022 0931   LYMPHSABS 2.7 04/19/2020 1144   MONOABS 1.1 (H) 10/16/2022 0931   EOSABS 0.8 (H) 10/16/2022 0931   EOSABS 0.4 04/19/2020 1144   BASOSABS 0.1 10/16/2022 0931   BASOSABS 0.1 04/19/2020 1144      Latest Ref Rng & Units 3/Horn/2024   11:15 AM 10/16/2022    9:27 AM 08/15/2022    8:56 PM  BMP  Glucose 70 - 99 mg/dL 409  811  914   BUN 6 - 23 mg/dL 13  17  11    Creatinine 0.40 - 1.20 mg/dL 7.82  9.56  2.13   Sodium 135 - 145 mEq/L 139  136  137   Potassium 3.5 - 5.1 mEq/L 4.0  4.3  4.3   Chloride 96 - 112 mEq/L 101  100  104   CO2 19 - 32 mEq/L 28  27  23    Calcium 8.4 - 10.5 mg/dL 9.1  08.6  9.2  Chest imaging: CT Chest 01/21/23 1. Persistent 15 mm sub solid right lower lobe pulmonary nodule, with 7 mm solid component. Given the persistence and size of the solid component, this nodule is worrisome for adenocarcinoma. Tissue sampling is recommended. This recommendation follows the consensus statement: Guidelines for Management of Incidental Pulmonary Nodules Detected on CT Images: From the Fleischner Society 2017; Radiology 2017; 284:228-243. 2. Aortic Atherosclerosis (ICD10-I70.0) and Emphysema  CT Chest 10/23/22 15 mm irregular/subsolid nodule in the superior segment right lower lobe. While this may be infectious/inflammatory, neoplasm is not excluded.   Per Fleischner Society Guidelines, recommend a non-contrast Chest CT at 3-6 months to  assess for persistence. If persistent, this appearance would raise concern for invasive adenocarcinoma.   Aortic Atherosclerosis (ICD10-I70.0) and Emphysema  PFT:    Latest Ref Rng & Units 04/24/2020    2:49 PM  PFT Results  FVC-Pre L 2.53   FVC-Predicted Pre % 63   FVC-Post L 2.47   FVC-Predicted Post % 61   Pre FEV1/FVC % % 50   Post FEV1/FCV % % 56   FEV1-Pre L 1.27   FEV1-Predicted Pre % 41   FEV1-Post L 1.37   DLCO uncorrected ml/min/mmHg 17.23   DLCO [facility]% % 70   DLVA Predicted % 101     Labs:  Path:  Echo 2021: LV EF 55-60%. Mild concentric hypertrophy. RV function and size is normal. LA and RA are mildly elevated.   Heart Catheterization:       Assessment & Plan:   Asthma-COPD overlap syndrome  Lung nodule - Plan: CT Chest Wo Contrast  Discussion: Kelli Horn is a 67 year old woman, former smoker with history of asthma-COPD overlap syndrome, sleep apnea intolerant to CPAP, history of breast cancer and endometrial cancer, severe peripheral vascular disease and diabetes who returns to pulmonary clinic for follow up.   She is to continue trelegy ellipta 1 puff daily and as needed albuterol.   We will repeat a CT Chest scan in 6 months to monitor the pulmonary nodule.   Follow up in 6 months.   Melody Comas, MD  Pulmonary & Critical Care Office: [phone]   Current Outpatient Medications:    albuterol (PROVENTIL) (2.5 MG/3ML) 0.083% nebulizer solution, Take 3 mLs (2.5 mg total) by nebulization every 6 (six) hours as needed for wheezing or shortness of breath., Disp: 75 mL, Rfl: 5   albuterol (VENTOLIN HFA) 108 (90 Base) MCG/ACT inhaler, Inhale 2 puffs into the lungs every 4 (four) hours as needed for wheezing or shortness of breath., Disp: 8 g, Rfl: 3   aspirin EC 81 MG tablet, Take 81 mg by mouth daily. Swallow whole., Disp: , Rfl:    cetirizine (ZYRTEC) 10 MG tablet, Take 10 mg by mouth daily., Disp: , Rfl:    enalapril (VASOTEC) 10 MG  tablet, Take 20 mg by mouth 2 (two) times daily., Disp: , Rfl:    Fluticasone-Umeclidin-Vilant (TRELEGY ELLIPTA) 100-62.5-25 MCG/ACT AEPB, Inhale 1 puff into the lungs daily., Disp: 1 each, Rfl: 5   hydrOXYzine (VISTARIL) 25 MG capsule, Take 1 capsule (25 mg total) by mouth 4 (four) times daily as needed., Disp: 30 capsule, Rfl: 5   Polyethyl Glycol-Propyl Glycol 0.4-0.3 % SOLN, Place 1 drop into both eyes 3 (three) times daily as needed (dry eyes). , Disp: , Rfl:

## 2023-02-02 ENCOUNTER — Other Ambulatory Visit: Payer: Medicare Other

## 2023-02-06 ENCOUNTER — Other Ambulatory Visit: Payer: Self-pay | Admitting: Cardiology

## 2023-02-06 DIAGNOSIS — M7989 Other specified soft tissue disorders: Secondary | ICD-10-CM

## 2023-02-12 ENCOUNTER — Encounter: Payer: Self-pay | Admitting: Pulmonary Disease

## 2023-02-23 ENCOUNTER — Ambulatory Visit (INDEPENDENT_AMBULATORY_CARE_PROVIDER_SITE_OTHER): Payer: Medicare Other

## 2023-02-23 VITALS — Ht 70.0 in | Wt 330.0 lb

## 2023-02-23 DIAGNOSIS — Z0000 Encounter for general adult medical examination without abnormal findings: Secondary | ICD-10-CM | POA: Diagnosis not present

## 2023-02-23 NOTE — Progress Notes (Signed)
Subjective:   Kelli Horn is a 67 y.o. female who presents for an Initial Medicare Annual Wellness Visit.  Visit Complete: Virtual  I connected with  Kelli Horn on 02/23/23 by a audio enabled telemedicine application and verified that I am speaking with the correct person using two identifiers.  Patient Location: Home  Provider Location: Office/Clinic  I discussed the limitations of evaluation and management by telemedicine. The patient expressed understanding and agreed to proceed.    Review of Systems     Cardiac Risk Factors include: advanced age (>66men, >77 women);diabetes mellitus;dyslipidemia;hypertension;obesity (BMI >30kg/m2)     Objective:    Today's Vitals   02/23/23 1512  Weight: (!) 330 lb (149.7 kg)  Height: 5\' 10"  (1.778 m)   Body mass index is 47.35 kg/m.     02/23/2023    3:19 PM 12/31/2020   10:57 AM 08/08/2020    8:04 AM 07/05/2020   11:43 AM 07/03/2020    1:04 PM 06/27/2020   11:03 AM 06/14/2020    9:51 AM  Advanced Directives  Does Patient Have a Medical Advance Directive? No No No No No No No  Would patient like information on creating a medical advance directive?  No - Patient declined No - Patient declined No - Guardian declined No - Patient declined No - Patient declined No - Patient declined    Current Medications (verified) Outpatient Encounter Medications as of 02/23/2023  Medication Sig   albuterol (PROVENTIL) (2.5 MG/3ML) 0.083% nebulizer solution Take 3 mLs (2.5 mg total) by nebulization every 6 (six) hours as needed for wheezing or shortness of breath.   albuterol (VENTOLIN HFA) 108 (90 Base) MCG/ACT inhaler Inhale 2 puffs into the lungs every 4 (four) hours as needed for wheezing or shortness of breath.   aspirin EC 81 MG tablet Take 81 mg by mouth daily. Swallow whole.   cetirizine (ZYRTEC) 10 MG tablet Take 10 mg by mouth daily.   enalapril (VASOTEC) 10 MG tablet Take 20 mg by mouth 2 (two) times daily.    Fluticasone-Umeclidin-Vilant (TRELEGY ELLIPTA) 100-62.5-25 MCG/ACT AEPB Inhale 1 puff into the lungs daily.   hydrOXYzine (VISTARIL) 25 MG capsule Take 1 capsule (25 mg total) by mouth 4 (four) times daily as needed.   Polyethyl Glycol-Propyl Glycol 0.4-0.3 % SOLN Place 1 drop into both eyes 3 (three) times daily as needed (dry eyes).    No facility-administered encounter medications on file as of 02/23/2023.    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 Sleep  study done Jun 20 2020   SOB (shortness of breath)    Swallowing difficulty    Past Surgical History:  Procedure Laterality Date   ABDOMINAL AORTOGRAM W/LOWER EXTREMITY Bilateral 08/08/2020   Procedure: ABDOMINAL AORTOGRAM W/LOWER EXTREMITY;  Surgeon: Runell Gess, MD;  Location: [facility] INVASIVE CV LAB;  Service: Cardiovascular;  Laterality: Bilateral;   APPENDECTOMY     BIOPSY  06/27/2020   Procedure: BIOPSY;  Surgeon: Charlott Rakes, MD;  Location: [facility];  Service: Endoscopy;;   BREAST LUMPECTOMY WITH RADIOACTIVE SEED AND SENTINEL LYMPH NODE BIOPSY Bilateral 06/07/2020   Procedure: BILATERAL BREAST  LUMPECTOMY WITH RADIOACTIVE SEED AND LEFT SENTINEL LYMPH NODE BIOPSY;  Surgeon: Griselda Miner, MD;  Location: [facility];  Service: General;  Laterality: Bilateral;  PEC BLOCK   COLONOSCOPY WITH PROPOFOL N/A 06/27/2020   Procedure: COLONOSCOPY WITH PROPOFOL;  Surgeon: Charlott Rakes, MD;  Location: [facility];  Service: Endoscopy;  Laterality: N/A;   LEFT HEART CATH AND CORONARY ANGIOGRAPHY N/A 04/23/2020   Procedure: LEFT HEART CATH AND CORONARY ANGIOGRAPHY;  Surgeon: Swazil[place], Peter M, MD;  Location: [facility] INVASIVE CV LAB;  Service: Cardiovascular;  Laterality: N/A;   POLYPECTOMY  06/27/2020   Procedure: POLYPECTOMY;  Surgeon: Charlott Rakes, MD;  Location: [facility];  Service: Endoscopy;;   ROBOTIC ASSISTED TOTAL HYSTERECTOMY WITH BILATERAL SALPINGO OOPHERECTOMY N/A 07/11/2020   Procedure: XI ROBOTIC ASSISTED TOTAL HYSTERECTOMY WITH BILATERAL SALPINGO OOPHORECTOMY;  Surgeon: Adolphus Birchwood, MD;  Location: [facility];  Service: Gynecology;  Laterality: N/A;   Family History  Problem Relation Age of Onset   Lung cancer Mother    Schizophrenia Mother    Heart attack Father 84   Aneurysm Father    Diabetes Father    Hypertension Father    High Cholesterol Father    Stroke Father    Obesity Father    AAA (abdominal aortic aneurysm) Father    Breast cancer Maternal Aunt    Heart disease Paternal Uncle    Aneurysm Paternal Uncle    Diabetes Other    Colon cancer Neg Hx    Cancer - Ovarian Neg Hx    Endometrial cancer Neg Hx    Pancreatic cancer Neg Hx    Prostate cancer Neg Hx    Social History   Socioeconomic History   Marital status: Married    Spouse name: Not on file   Number of children: 2   Years of education: Not on file   Highest education level: Not on file  Occupational History   Occupation: retired , 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  Tobacco Use   Smoking status: Former    Packs/day: 2.00    Years: 10.00    Additional pack years: 0.00    Total pack  years: 20.00    Types: Cigarettes    Quit date: 04/16/2004    Years since quitting: 18.8   Smokeless tobacco: Never  Vaping Use   Vaping Use: Never used  Substance and Sexual Activity   Alcohol use: No   Drug use: No   Sexual activity: Not Currently  Other Topics Concern   Not on file  Social History Narrative   Not on file   Social Determinants of Health   Financial Resource Strain: Low Risk  (02/23/2023)   Overall Financial Resource Strain (CARDIA)    Difficulty of Paying Living Expenses: Not hard at all  Food Insecurity: No Food Insecurity (02/23/2023)   Hunger Vital Sign    Worried About  Running Out of Food in the Last Year: Never true    Ran Out of Food in the Last Year: Never true  Transportation Needs: No Transportation Needs (02/23/2023)   PRAPARE - Administrator, Civil Service (Medical): No    Lack of Transportation (Non-Medical): No  Physical Activity: Inactive (02/23/2023)   Exercise Vital Sign    Days of Exercise per Week: 0 days    Minutes of Exercise per Session: 0 min  Stress: No Stress Concern Present (02/23/2023)   [employer] of Occupational Health - Occupational Stress Questionnaire    Feeling of Stress : Only a little  Social Connections: Moderately Isolated (02/23/2023)   Social Connection and Isolation Panel [NHANES]    Frequency of Communication with Friends and Family: Three times a week    Frequency of Social Gatherings with Friends and Family: Three times a week    Attends Religious Services: Never    Active Member of Clubs or Organizations: No    Attends Banker Meetings: Never    Marital Status: Married    Tobacco Counseling Counseling given: Not Answered   Clinical Intake:  Pre-visit preparation completed: Yes  Pain : No/denies pain     Nutritional Status: BMI > 30  Obese Nutritional Risks: None Diabetes: Yes CBG done?: No Did pt. bring in CBG monitor from home?: No  How often do you need to have  someone help you when you read instructions, pamphlets, or other written materials from your doctor or pharmacy?: 1 - Never  Interpreter Needed?: No  Information entered by :: NAllen LPN   Activities of Daily Living    02/23/2023    3:13 PM  In your present state of health, do you have any difficulty performing the following activities:  Hearing? 0  Vision? 1  Comment sometimes, but has eye doctor appointment coming up  Difficulty concentrating or making decisions? 0  Walking or climbing stairs? 1  Dressing or bathing? 0  Doing errands, shopping? 0  Preparing Food and eating ? N  Using the Toilet? N  In the past six months, have you accidently leaked urine? N  Do you have problems with loss of bowel control? N  Managing your Medications? N  Managing your Finances? N  Housekeeping or managing your Housekeeping? N    Patient Care Team: Loyola Mast, MD as PCP - General (Family Medicine) Pershing Proud, RN as Oncology Nurse Navigator Donnelly Angelica, RN as Oncology Nurse Navigator Griselda Miner, MD as Consulting Physician (General Surgery) Malachy Mood, MD as Consulting Physician (Hematology) Lonie Peak, MD as Attending Physician (Radiation Oncology) Adolphus Birchwood, MD as Consulting Physician (Gynecologic Oncology) [place], Peter M, MD as Consulting Physician (Cardiology) Roswell Nickel, DO as Consulting Physician (Bariatrics) Glenford Bayley, NP as Nurse Practitioner (Pulmonary Disease)  Indicate any recent Medical Services you may have received from other than [facility] providers in the past year (date may be approximate).     Assessment:   This is a routine wellness examination for [place].  Hearing/Vision screen Vision Screening - Comments:: Regular eye exams, [facility]  Dietary issues and exercise activities discussed:     Goals Addressed             This Visit's Progress    Patient Stated       02/23/2023, try to drink more water       Depression  Screen    02/23/2023    3:21 PM  08/03/2022    1:14 PM 01/20/2022    1:50 PM 01/09/2021    3:20 PM 11/05/2020    8:42 AM 10/02/2020    2:04 PM 05/05/2016   10:07 AM  PHQ 2/9 Scores  PHQ - 2 Score 0 0 0 0 6 0 0  PHQ- 9 Score 2  3 0 21    Exception Documentation       Other- indicate reason in comment box    Fall Risk    02/23/2023    3:20 PM 10/14/2022    8:05 AM 08/03/2022    1:14 PM 10/02/2020    2:04 PM 05/05/2016   10:07 AM  Fall Risk   Falls in the past year? 0 0 0 0 No  Number falls in past yr: 0 0 0    Injury with Fall? 0 0 0    Risk for fall due to : Medication side effect  No Fall Risks  Other (Comment)  Follow up Falls prevention discussed;Education provided;Falls evaluation completed Falls evaluation completed Falls evaluation completed      MEDICARE RISK AT HOME:  Medicare Risk at Home - 02/23/23 1521     Any stairs in or around the home? Yes    If so, are there any without handrails? No    Home free of loose throw rugs in walkways, pet beds, electrical cords, etc? Yes    Adequate lighting in your home to reduce risk of falls? Yes    Life alert? No    Use of a cane, walker or w/c? No    Grab bars in the bathroom? No    Shower chair or bench in shower? Yes    Elevated toilet seat or a handicapped toilet? Yes             TIMED UP AND GO:  Was the test performed? No    Cognitive Function:        02/23/2023    3:22 PM  6CIT Screen  What Year? 0 points  What month? 0 points  What time? 0 points  Count back from 20 0 points  Months in reverse 0 points  Repeat phrase 2 points  Total Score 2 points    Immunizations Immunization History  Administered Date(s) Administered   PFIZER(Purple Top)SARS-COV-2 Vaccination 06/10/2020, 07/01/2020   PNEUMOCOCCAL CONJUGATE-20 11/12/2022    TDAP status: Due, Education has been provided regarding the importance of this vaccine. Advised may receive this vaccine at local pharmacy or Health Dept. Aware to provide a copy of  the vaccination record if obtained from local pharmacy or Health Dept. Verbalized acceptance and understanding.  Flu Vaccine status: Up to date  Pneumococcal vaccine status: Up to date  Covid-19 vaccine status: Completed vaccines  Qualifies for Shingles Vaccine? Yes   Zostavax completed No   Shingrix Completed?: No.    Education has been provided regarding the importance of this vaccine. Patient has been advised to call insurance company to determine out of pocket expense if they have not yet received this vaccine. Advised may also receive vaccine at local pharmacy or Health Dept. Verbalized acceptance and understanding.  Screening Tests Health Maintenance  Topic Date Due   Medicare Annual Wellness (AWV)  Never done   DTaP/Tdap/Td (1 - Tdap) Never done   Zoster Vaccines- Shingrix (1 of 2) Never done   COVID-19 Vaccine (3 - Pfizer risk series) 07/29/2020   DEXA SCAN  Never done   OPHTHALMOLOGY EXAM  03/06/2023   INFLUENZA VACCINE  04/01/2023   HEMOGLOBIN A1C  05/15/2023   Diabetic kidney evaluation - eGFR measurement  11/12/2023   Diabetic kidney evaluation - Urine ACR  11/12/2023   FOOT EXAM  11/12/2023   MAMMOGRAM  12/09/2024   Colonoscopy  06/27/2030   Pneumonia Vaccine 27+ Years old  Completed   Hepatitis C Screening  Completed   HPV VACCINES  Aged Out    Health Maintenance  Health Maintenance Due  Topic Date Due   Medicare Annual Wellness (AWV)  Never done   DTaP/Tdap/Td (1 - Tdap) Never done   Zoster Vaccines- Shingrix (1 of 2) Never done   COVID-19 Vaccine (3 - Pfizer risk series) 07/29/2020   DEXA SCAN  Never done    Colorectal cancer screening: Type of screening: Colonoscopy. Completed 06/27/2020. Repeat every 10 years  Mammogram status: Completed 12/10/2022. Repeat every year  Bone Density status: Ordered 08/03/2022. Pt provided with contact info and advised to call to schedule appt.  Lung Cancer Screening: (Low Dose CT Chest recommended if Age 40-80 years, 20  pack-year currently smoking OR have quit w/in 15years.) does not qualify.   Lung Cancer Screening Referral: no  Additional Screening:  Hepatitis C Screening: does qualify; Completed 11/12/2022  Vision Screening: Recommended annual ophthalmology exams for early detection of glaucoma and other disorders of the eye. Is the patient up to date with their annual eye exam?  Yes  Who is the provider or what is the name of the office in which the patient attends annual eye exams? [facility] If pt is not established with a provider, would they like to be referred to a provider to establish care? No .   Dental Screening: Recommended annual dental exams for proper oral hygiene  Diabetic Foot Exam: Diabetic Foot Exam: Completed 11/12/2022  Community Resource Referral / Chronic Care Management: CRR required this visit?  No   CCM required this visit?  No     Plan:     I have personally reviewed and noted the following in the patient's chart:   Medical and social history Use of alcohol, tobacco or illicit drugs  Current medications and supplements including opioid prescriptions. Patient is not currently taking opioid prescriptions. Functional ability and status Nutritional status Physical activity Advanced directives List of other physicians Hospitalizations, surgeries, and ER visits in previous 12 months Vitals Screenings to include cognitive, depression, and falls Referrals and appointments  In addition, I have reviewed and discussed with patient certain preventive protocols, quality metrics, and best practice recommendations. A written personalized care plan for preventive services as well as general preventive health recommendations were provided to patient.     Barb Merino, LPN   5/57/3220   After Visit Summary: (MyChart) Due to this being a telephonic visit, the after visit summary with patients personalized plan was offered to patient via MyChart   Nurse Notes:  none

## 2023-02-23 NOTE — Patient Instructions (Signed)
Kelli Horn , Thank you for taking time to come for your Medicare Wellness Visit. I appreciate your ongoing commitment to your health goals. Please review the following plan we discussed and let me know if I can assist you in the future.   These are the goals we discussed:  Goals      Patient Stated     02/23/2023, try to drink more water        This is a list of the screening recommended for you and due dates:  Health Maintenance  Topic Date Due   DTaP/Tdap/Td vaccine (1 - Tdap) Never done   Zoster (Shingles) Vaccine (1 of 2) Never done   COVID-19 Vaccine (3 - Pfizer risk series) 07/29/2020   DEXA scan (bone density measurement)  Never done   Eye exam for diabetics  03/06/2023   Flu Shot  04/01/2023   Hemoglobin A1C  05/15/2023   Yearly kidney function blood test for diabetes  11/12/2023   Yearly kidney health urinalysis for diabetes  11/12/2023   Complete foot exam   11/12/2023   Medicare Annual Wellness Visit  02/23/2024   Mammogram  12/09/2024   Colon Cancer Screening  06/27/2030   Pneumonia Vaccine  Completed   Hepatitis C Screening  Completed   HPV Vaccine  Aged Out    Advanced directives: Advance directive discussed with you today.   Conditions/risks identified: none  Next appointment: Follow up in one year for your annual wellness visit    Preventive Care 65 Years and Older, Female Preventive care refers to lifestyle choices and visits with your health care provider that can promote health and wellness. What does preventive care include? A yearly physical exam. This is also called an annual well check. Dental exams once or twice a year. Routine eye exams. Ask your health care provider how often you should have your eyes checked. Personal lifestyle choices, including: Daily care of your teeth and gums. Regular physical activity. Eating a healthy diet. Avoiding tobacco and drug use. Limiting alcohol use. Practicing safe sex. Taking low-dose aspirin every  day. Taking vitamin and mineral supplements as recommended by your health care provider. What happens during an annual well check? The services and screenings done by your health care provider during your annual well check will depend on your age, overall health, lifestyle risk factors, and family history of disease. Counseling  Your health care provider may ask you questions about your: Alcohol use. Tobacco use. Drug use. Emotional well-being. Home and relationship well-being. Sexual activity. Eating habits. History of falls. Memory and ability to understand (cognition). Work and work Astronomer. Reproductive health. Screening  You may have the following tests or measurements: Height, weight, and BMI. Blood pressure. Lipid and cholesterol levels. These may be checked every 5 years, or more frequently if you are over 81 years old. Skin check. Lung cancer screening. You may have this screening every year starting at age 53 if you have a 30-pack-year history of smoking and currently smoke or have quit within the past 15 years. Fecal occult blood test (FOBT) of the stool. You may have this test every year starting at age 71. Flexible sigmoidoscopy or colonoscopy. You may have a sigmoidoscopy every 5 years or a colonoscopy every 10 years starting at age 38. Hepatitis C blood test. Hepatitis B blood test. Sexually transmitted disease (STD) testing. Diabetes screening. This is done by checking your blood sugar (glucose) after you have not eaten for a while (fasting). You may have this  done every 1-3 years. Bone density scan. This is done to screen for osteoporosis. You may have this done starting at age 28. Mammogram. This may be done every 1-2 years. Talk to your health care provider about how often you should have regular mammograms. Talk with your health care provider about your test results, treatment options, and if necessary, the need for more tests. Vaccines  Your health care  provider may recommend certain vaccines, such as: Influenza vaccine. This is recommended every year. Tetanus, diphtheria, and acellular pertussis (Tdap, Td) vaccine. You may need a Td booster every 10 years. Zoster vaccine. You may need this after age 43. Pneumococcal 13-valent conjugate (PCV13) vaccine. One dose is recommended after age 75. Pneumococcal polysaccharide (PPSV23) vaccine. One dose is recommended after age 66. Talk to your health care provider about which screenings and vaccines you need and how often you need them. This information is not intended to replace advice given to you by your health care provider. Make sure you discuss any questions you have with your health care provider. Document Released: 09/13/2015 Document Revised: 05/06/2016 Document Reviewed: 06/18/2015 Elsevier Interactive Patient Education  2017 [place] Prevention in the Home Falls can cause injuries. They can happen to people of all ages. There are many things you can do to make your home safe and to help prevent falls. What can I do on the outside of my home? Regularly fix the edges of walkways and driveways and fix any cracks. Remove anything that might make you trip as you walk through a door, such as a raised step or threshold. Trim any bushes or trees on the path to your home. Use bright outdoor lighting. Clear any walking paths of anything that might make someone trip, such as rocks or tools. Regularly check to see if handrails are loose or broken. Make sure that both sides of any steps have handrails. Any raised decks and porches should have guardrails on the edges. Have any leaves, snow, or ice cleared regularly. Use sand or salt on walking paths during winter. Clean up any spills in your garage right away. This includes oil or grease spills. What can I do in the bathroom? Use night lights. Install grab bars by the toilet and in the tub and shower. Do not use towel bars as grab  bars. Use non-skid mats or decals in the tub or shower. If you need to sit down in the shower, use a plastic, non-slip stool. Keep the floor dry. Clean up any water that spills on the floor as soon as it happens. Remove soap buildup in the tub or shower regularly. Attach bath mats securely with double-sided non-slip rug tape. Do not have throw rugs and other things on the floor that can make you trip. What can I do in the bedroom? Use night lights. Make sure that you have a light by your bed that is easy to reach. Do not use any sheets or blankets that are too big for your bed. They should not hang down onto the floor. Have a firm chair that has side arms. You can use this for support while you get dressed. Do not have throw rugs and other things on the floor that can make you trip. What can I do in the kitchen? Clean up any spills right away. Avoid walking on wet floors. Keep items that you use a lot in easy-to-reach places. If you need to reach something above you, use a strong step stool that  has a grab bar. Keep electrical cords out of the way. Do not use floor polish or wax that makes floors slippery. If you must use wax, use non-skid floor wax. Do not have throw rugs and other things on the floor that can make you trip. What can I do with my stairs? Do not leave any items on the stairs. Make sure that there are handrails on both sides of the stairs and use them. Fix handrails that are broken or loose. Make sure that handrails are as long as the stairways. Check any carpeting to make sure that it is firmly attached to the stairs. Fix any carpet that is loose or worn. Avoid having throw rugs at the top or bottom of the stairs. If you do have throw rugs, attach them to the floor with carpet tape. Make sure that you have a light switch at the top of the stairs and the bottom of the stairs. If you do not have them, ask someone to add them for you. What else can I do to help prevent  falls? Wear shoes that: Do not have high heels. Have rubber bottoms. Are comfortable and fit you well. Are closed at the toe. Do not wear sandals. If you use a stepladder: Make sure that it is fully opened. Do not climb a closed stepladder. Make sure that both sides of the stepladder are locked into place. Ask someone to hold it for you, if possible. Clearly mark and make sure that you can see: Any grab bars or handrails. First and last steps. Where the edge of each step is. Use tools that help you move around (mobility aids) if they are needed. These include: Canes. Walkers. Scooters. Crutches. Turn on the lights when you go into a dark area. Replace any light bulbs as soon as they burn out. Set up your furniture so you have a clear path. Avoid moving your furniture around. If any of your floors are uneven, fix them. If there are any pets around you, be aware of where they are. Review your medicines with your doctor. Some medicines can make you feel dizzy. This can increase your chance of falling. Ask your doctor what other things that you can do to help prevent falls. This information is not intended to replace advice given to you by your health care provider. Make sure you discuss any questions you have with your health care provider. Document Released: 06/13/2009 Document Revised: 01/23/2016 Document Reviewed: 09/21/2014 Elsevier Interactive Patient Education  2017 [employer].

## 2023-03-09 ENCOUNTER — Ambulatory Visit: Payer: Medicare Other | Admitting: Physician Assistant

## 2023-03-11 DIAGNOSIS — H02834 Dermatochalasis of left upper eyelid: Secondary | ICD-10-CM | POA: Diagnosis not present

## 2023-03-11 DIAGNOSIS — H02831 Dermatochalasis of right upper eyelid: Secondary | ICD-10-CM | POA: Diagnosis not present

## 2023-03-11 DIAGNOSIS — H2513 Age-related nuclear cataract, bilateral: Secondary | ICD-10-CM | POA: Diagnosis not present

## 2023-03-11 DIAGNOSIS — E119 Type 2 diabetes mellitus without complications: Secondary | ICD-10-CM | POA: Diagnosis not present

## 2023-03-23 ENCOUNTER — Other Ambulatory Visit: Payer: Self-pay | Admitting: Cardiology

## 2023-05-17 ENCOUNTER — Other Ambulatory Visit: Payer: Self-pay | Admitting: Family Medicine

## 2023-05-17 DIAGNOSIS — I10 Essential (primary) hypertension: Secondary | ICD-10-CM

## 2023-05-18 ENCOUNTER — Telehealth: Payer: Self-pay | Admitting: Pulmonary Disease

## 2023-05-18 NOTE — Telephone Encounter (Signed)
Patient would like samples if it takes too long for the prescription to be filled. Please call and advise [phone]

## 2023-05-18 NOTE — Telephone Encounter (Signed)
Patient needs a refill of trelegy. She is completely out.  Pharmacy: [facility]

## 2023-05-19 MED ORDER — TRELEGY ELLIPTA 100-62.5-25 MCG/ACT IN AEPB: 1.0000 | INHALATION_SPRAY | Freq: Every day | RESPIRATORY_TRACT | 5 refills | Status: DC

## 2023-05-19 NOTE — Telephone Encounter (Signed)
Patient is calling for a prescription refill on her trelegy. She has been out for 3 days now. Patient states that the pharmacy has faxed as well.

## 2023-05-19 NOTE — Telephone Encounter (Signed)
Sent in as requested

## 2023-05-21 ENCOUNTER — Encounter: Payer: Self-pay | Admitting: Pulmonary Disease

## 2023-07-09 NOTE — Progress Notes (Signed)
Pharmacy Quality Measure Review  This patient is appearing on report for being at risk of failing the measure for Statin Therapy for Patients with Cardiovascular Disease [facility]) medications this calendar year. Documented intolerance to statin therapy (myalgias). Previously tried Ezetimibe trial but was not continued due to patient refusal.   Lipid Panel     Component Value Date/Time   CHOL 225 (H) 11/12/2022 1115   CHOL 196 04/19/2020 1144   TRIG 134.0 11/12/2022 1115   HDL 45.20 11/12/2022 1115   HDL 35 (L) 04/19/2020 1144   CHOLHDL 5 11/12/2022 1115   VLDL 26.8 11/12/2022 1115   LDLCALC 153 (H) 11/12/2022 1115   LDLCALC 133 (H) 04/19/2020 1144   LABVLDL 28 04/19/2020 1144    Prior trials of:  -Simvastatin -Atorvastatin  -Rosuvastatin  -Ezetimibe   Consider risk-benefits discussion of other lipid-lowering therapies (PCSK-9 inhibitors, bempedoic acid, etc.)   Sofie Rower, PharmD [employer] PGY-1

## 2023-07-13 ENCOUNTER — Other Ambulatory Visit (HOSPITAL_COMMUNITY): Payer: Medicare Other

## 2023-07-13 ENCOUNTER — Ambulatory Visit (HOSPITAL_COMMUNITY)
Admission: RE | Admit: 2023-07-13 | Discharge: 2023-07-13 | Disposition: A | Discharge status: Home / Self Care | Payer: Medicare Other | Source: Ambulatory Visit | Attending: Pulmonary Disease | Admitting: Pulmonary Disease

## 2023-07-13 DIAGNOSIS — I700 Atherosclerosis of aorta: Secondary | ICD-10-CM | POA: Diagnosis not present

## 2023-07-13 DIAGNOSIS — R911 Solitary pulmonary nodule: Secondary | ICD-10-CM | POA: Diagnosis not present

## 2023-07-13 DIAGNOSIS — J439 Emphysema, unspecified: Secondary | ICD-10-CM | POA: Diagnosis not present

## 2023-10-05 ENCOUNTER — Ambulatory Visit: Payer: Medicare Other | Admitting: Pulmonary Disease

## 2023-10-05 ENCOUNTER — Encounter: Payer: Self-pay | Admitting: Pulmonary Disease

## 2023-10-05 VITALS — BP 146/82 | HR 102 | Ht 70.0 in | Wt 337.4 lb

## 2023-10-05 DIAGNOSIS — J4489 Other specified chronic obstructive pulmonary disease: Secondary | ICD-10-CM

## 2023-10-05 DIAGNOSIS — G4733 Obstructive sleep apnea (adult) (pediatric): Secondary | ICD-10-CM

## 2023-10-05 DIAGNOSIS — R911 Solitary pulmonary nodule: Secondary | ICD-10-CM | POA: Diagnosis not present

## 2023-10-05 DIAGNOSIS — R2240 Localized swelling, mass and lump, unspecified lower limb: Secondary | ICD-10-CM | POA: Diagnosis not present

## 2023-10-05 NOTE — Patient Instructions (Addendum)
 We will check your oxygen levels today while walking to determine if you need oxygen  We will check an overnight oxygen test on room air  Continue trelegy ellipta  1 puff daily, we will give you samples today  We will refer you to pulmonary rehab for COPD  Follow up in 3 months for pulmonary function tests and follow up after your follow up CT Chest scan

## 2023-10-05 NOTE — Progress Notes (Signed)
 Synopsis: Referred in February 2024 for COPD with asthma by Garnette Simpler, MD  Subjective:   PATIENT ID: Kelli Horn GENDER: female DOB: March 27, 1956, MRN: [id number]  HPI  Chief Complaint  Patient presents with   Follow-up    Ct f/u 07/13/23, pt states I can't breathe, pt is having sob during any movement. Pt would like to talk about cost for inhalers. Would like a cheaper alternative for trelegy & albuterol .   Kelli Horn is a 68 year old woman, former smoker with history of asthma-COPD overlap syndrome, sleep apnea intolerant to CPAP, history of breast cancer and endometrial cancer, severe peripheral vascular disease and diabetes who returns to pulmonary clinic for follow up.   She experiences worsening shortness of breath, particularly with exertion, leading to a significant decrease in her activity level, approximately a 50% reduction. Breathing is manageable while sitting but becomes difficult with physical activity. Her severe atherosclerosis is being evaluated for an aortic bypass due to lack of blood flow to her legs, and her breathing issues are impacting her ability to walk, contributing to decreased mobility.  She is using trelegy daily.  She has mild sleep apnea and narcolepsy, with difficulty using CPAP therapy due to discomfort. She experiences poor sleep, often only sleeping for two hours at a time.   She has gained 20 pounds over the holidays, which she attributes to decreased mobility and worsening breathing issues. This weight gain has further impacted her ability to walk and breathe.  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 She is concerned about her ability to care for him due to her declining health.  OV 10/26/22 She was seen 10/16/22 by Madelin Stank, NP and treated for exacerbation with prednisone  and antibiotics. She was started on prilosec 20mg  daily and pepcid at betime for cough due to reflux. She  doesn't feel much better since that visit.  CT Chest 10/26/21 shows moderate centrilobular and paraseptal emphysematous changes, upper lung predominant and 15mm irregular/subsolid nodule with an 8mm solid component of superior segment of RLL.   Absolute Eos 800 on 10/16/22  She reports being sick over the past 3 months which started with a respiratory viral illness. She has persistent dyspnea. She has palpitations and lower extremity edema. She denies wheezing or cough. She reports swelling of her breasts with thickening of the skin.   She has visit with Dr. Jordan of Cardiology tomorrow.   OV 01/28/23 - Today She is without acute respiratory symptoms and her breathing has been stable since last visit.   CT Chest scan 01/21/23 shows stable RLL mixed solid groundglass nodule compared to 10/2022.    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 Sleep study done Jun 20 2020   SOB (shortness of breath)    Swallowing difficulty      Family History  Problem Relation Age of Onset   Lung cancer Mother    Schizophrenia Mother    Heart attack Father 54   Aneurysm Father    Diabetes Father    Hypertension Father    High Cholesterol Father    Stroke Father    Obesity Father    AAA (abdominal aortic aneurysm) Father    Breast cancer Maternal Aunt    Heart disease Paternal Uncle     Aneurysm Paternal Uncle    Diabetes Other    Colon cancer Neg Hx    Cancer - Ovarian Neg Hx    Endometrial cancer Neg Hx    Pancreatic cancer Neg Hx    Prostate cancer Neg Hx      Social History   Socioeconomic History   Marital status: Married    Spouse name: Not on file   Number of children: 2   Years of education: Not on file   Highest education level: Not on file  Occupational History   Occupation: retired , 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  Tobacco Use   Smoking status: Former    Current packs/day: 0.00    Average packs/day: 2.0 packs/day for 10.0 years (20.0 ttl pk-yrs)    Types: Cigarettes    Start date: 04/16/1994    Quit date: 04/16/2004    Years since quitting: 19.5   Smokeless tobacco: Never  Vaping Use   Vaping status: Never Used  Substance and Sexual Activity   Alcohol use: No   Drug use: No   Sexual activity: Not Currently  Other Topics Concern   Not on file  Social History Narrative   Not on file   Social Drivers of Health   Financial Resource Strain: Low Risk  (02/23/2023)   Overall Financial Resource Strain (CARDIA)    Difficulty of Paying Living Expenses: Not hard at all  Food Insecurity: No Food Insecurity (02/23/2023)   Hunger Vital Sign    Worried About Running Out of Food in the Last Year: Never true    Ran Out of Food in the Last Year: Never true  Transportation Needs: No Transportation Needs (02/23/2023)   PRAPARE - Administrator, Civil Service (Medical): No    Lack of Transportation (Non-Medical): No  Physical Activity: Inactive (02/23/2023)   Exercise Vital Sign    Days of Exercise per Week: 0 days    Minutes of Exercise per Session: 0 min  Stress: No Stress Concern Present (02/23/2023)   [employer] of Occupational Health - Occupational Stress Questionnaire    Feeling of Stress : Only a little  Social Connections: Moderately Isolated (02/23/2023)   Social Connection and Isolation Panel [NHANES]    Frequency of  Communication with Friends and Family: Three times a week    Frequency of Social Gatherings with Friends and Family: Three times a week    Attends Religious Services: Never    Active Member of Clubs or Organizations: No    Attends Banker Meetings: Never    Marital Status: Married  Catering Manager Violence: Not At Risk (02/23/2023)   Humiliation, Afraid, Rape, and Kick questionnaire    Fear of Current or Ex-Partner: No    Emotionally Abused: No    Physically Abused: No    Sexually Abused:  No     Allergies  Allergen Reactions   Beta Adrenergic Blockers Other (See Comments)    Trigger Asthma   Carvedilol  Shortness Of Breath    Reacts with albuterol , worsens asthma symptoms when taken together    Peanut-Containing Drug Products Other (See Comments)     Trigger Asthma attack   Crestor  [Rosuvastatin ]     myalgias   Iodinated Contrast Media Other (See Comments)    Pt states that she has never had any reaction what so ever to contrast.    Metformin  And Related Other (See Comments)    Epigastric pain, GI cramping.   Statins Other (See Comments)   Zocor [Simvastatin]     myalgias     Outpatient Medications Prior to Visit  Medication Sig Dispense Refill   albuterol  (VENTOLIN  HFA) 108 (90 Base) MCG/ACT inhaler Inhale 2 puffs into the lungs every 4 (four) hours as needed for wheezing or shortness of breath. 8 g 3   aspirin  EC 81 MG tablet Take 81 mg by mouth daily. Swallow whole.     enalapril  (VASOTEC ) 20 MG tablet TAKE 1 TABLET BY MOUTH TWICE DAILY 180 tablet 3   Fluticasone -Umeclidin-Vilant (TRELEGY ELLIPTA ) 100-62.5-25 MCG/ACT AEPB Inhale 1 puff into the lungs daily. 1 each 5   hydrOXYzine  (VISTARIL ) 25 MG capsule Take 1 capsule (25 mg total) by mouth 4 (four) times daily as needed. 30 capsule 5   Polyethyl Glycol-Propyl Glycol 0.4-0.3 % SOLN Place 1 drop into both eyes 3 (three) times daily as needed (dry eyes).      albuterol  (PROVENTIL ) (2.5 MG/3ML) 0.083% nebulizer  solution Take 3 mLs (2.5 mg total) by nebulization every 6 (six) hours as needed for wheezing or shortness of breath. (Patient not taking: Reported on 10/05/2023) 75 mL 5   cetirizine (ZYRTEC) 10 MG tablet Take 10 mg by mouth daily. (Patient not taking: Reported on 10/05/2023)     enalapril  (VASOTEC ) 10 MG tablet Take 20 mg by mouth 2 (two) times daily. (Patient not taking: Reported on 10/05/2023)     No facility-administered medications prior to visit.   Review of Systems  Constitutional:  Negative for chills, fever, malaise/fatigue and weight loss.  HENT:  Negative for congestion, sinus pain and sore throat.   Eyes: Negative.   Respiratory:  Negative for cough, hemoptysis, sputum production, shortness of breath and wheezing.   Cardiovascular:  Positive for leg swelling. Negative for chest pain, palpitations, orthopnea and claudication.  Gastrointestinal:  Negative for abdominal pain, heartburn, nausea and vomiting.  Genitourinary: Negative.   Musculoskeletal:  Negative for joint pain and myalgias.  Skin:  Negative for rash.  Neurological:  Negative for weakness.  Endo/Heme/Allergies: Negative.   Psychiatric/Behavioral: Negative.     Objective:   Vitals:   10/05/23 1332 10/05/23 1337  BP: (!) 170/90 (!) 146/82  Pulse: (!) 102   SpO2: 97%   Weight: (!) 337 lb 6.4 oz (153 kg)   Height: 5' 10 (1.778 m)    Physical Exam Constitutional:      General: She is not in acute distress.    Appearance: She is obese. She is not ill-appearing.  HENT:     Head: Normocephalic and atraumatic.  Eyes:     General: No scleral icterus.    Conjunctiva/sclera: Conjunctivae normal.  Cardiovascular:     Rate and Rhythm: Normal rate and regular rhythm.     Pulses: Normal pulses.     Heart sounds: Normal heart sounds. No murmur heard. Pulmonary:  Effort: Pulmonary effort is normal.     Breath sounds: Normal breath sounds. No wheezing, rhonchi or rales.  Abdominal:     General: Bowel sounds are  normal.     Palpations: Abdomen is soft.  Musculoskeletal:     Right lower leg: No edema.     Left lower leg: No edema.  Skin:    General: Skin is warm and dry.  Neurological:     General: No focal deficit present.     Mental Status: She is alert.    CBC    Component Value Date/Time   WBC 11.5 (H) 10/16/2022 0931   RBC 4.88 10/16/2022 0931   HGB 13.2 10/16/2022 0931   HGB 13.0 08/02/2020 1635   HCT 39.4 10/16/2022 0931   HCT 39.3 08/02/2020 1635   PLT 441.0 (H) 10/16/2022 0931   PLT 466 (H) 08/02/2020 1635   MCV 80.7 10/16/2022 0931   MCV 81 08/02/2020 1635   MCH 25.7 (L) 08/15/2022 2056   MCHC 33.5 10/16/2022 0931   RDW 15.7 (H) 10/16/2022 0931   RDW 12.4 08/02/2020 1635   LYMPHSABS 3.0 10/16/2022 0931   LYMPHSABS 2.7 04/19/2020 1144   MONOABS 1.1 (H) 10/16/2022 0931   EOSABS 0.8 (H) 10/16/2022 0931   EOSABS 0.4 04/19/2020 1144   BASOSABS 0.1 10/16/2022 0931   BASOSABS 0.1 04/19/2020 1144      Latest Ref Rng & Units 11/12/2022   11:15 AM 10/16/2022    9:27 AM 08/15/2022    8:56 PM  BMP  Glucose 70 - 99 mg/dL 883  875  880   BUN 6 - 23 mg/dL 13  17  11    Creatinine 0.40 - 1.20 mg/dL 8.99  8.87  9.06   Sodium 135 - 145 mEq/L 139  136  137   Potassium 3.5 - 5.1 mEq/L 4.0  4.3  4.3   Chloride 96 - 112 mEq/L 101  100  104   CO2 19 - 32 mEq/L 28  27  23    Calcium  8.4 - 10.5 mg/dL 9.1  89.8  9.2    Chest imaging: CT Chest 01/21/23 1. Persistent 15 mm sub solid right lower lobe pulmonary nodule, with 7 mm solid component. Given the persistence and size of the solid component, this nodule is worrisome for adenocarcinoma. Tissue sampling is recommended. This recommendation follows the consensus statement: Guidelines for Management of Incidental Pulmonary Nodules Detected on CT Images: From the Fleischner Society 2017; Radiology 2017; 284:228-243. 2. Aortic Atherosclerosis (ICD10-I70.0) and Emphysema  CT Chest 10/23/22 15 mm irregular/subsolid nodule in the superior  segment right lower lobe. While this may be infectious/inflammatory, neoplasm is not excluded.   Per Fleischner Society Guidelines, recommend a non-contrast Chest CT at 3-6 months to assess for persistence. If persistent, this appearance would raise concern for invasive adenocarcinoma.   Aortic Atherosclerosis (ICD10-I70.0) and Emphysema  PFT:    Latest Ref Rng & Units 04/24/2020    2:49 PM  PFT Results  FVC-Pre L 2.53   FVC-Predicted Pre % 63   FVC-Post L 2.47   FVC-Predicted Post % 61   Pre FEV1/FVC % % 50   Post FEV1/FCV % % 56   FEV1-Pre L 1.27   FEV1-Predicted Pre % 41   FEV1-Post L 1.37   DLCO uncorrected ml/min/mmHg 17.23   DLCO UNC% % 70   DLVA Predicted % 101     Labs:  Path:  Echo 2021: LV EF 55-60%. Mild concentric hypertrophy. RV function and size is  normal. LA and RA are mildly elevated.   Heart Catheterization:       Assessment & Plan:   Asthma-COPD overlap syndrome (HCC) - Plan: Pulmonary Function Test, Pulse oximetry, overnight, AMB referral to pulmonary rehabilitation  Lung nodule - Plan: CT Chest Wo Contrast  Discussion: Kelli Horn is a 68 year old woman, former smoker with history of asthma-COPD overlap syndrome, sleep apnea intolerant to CPAP, history of breast cancer and endometrial cancer, severe peripheral vascular disease and diabetes who returns to pulmonary clinic for follow up.   Pulmonary Nodule Stable size on recent CT scan. Discussed options of biopsy, PET scan, or repeat CT scan. Patient exposed to mold at work, which may have contributed to nodules. -Order repeat CT scan in 3 months (May 2025).  Chronic Obstructive Pulmonary Disease (COPD) with Asthma Overlap Patient reports decreased activity level due to shortness of breath. Noted emphysema on recent CT scan. Currently on Trelegy, but cost is a concern. -Consider alternative medications if cost of Trelegy remains prohibitive. -Order updated pulmonary function tests. -Refer  to pulmonary rehab program  Sleep Apnea Mild sleep apnea diagnosed previously, but patient unable to tolerate CPAP. Concerns about nocturnal oxygen levels. -Order nocturnal oxygen test to assess need for home oxygen therapy.  Edema Persistent leg swelling, possibly related to heart or lung issues. -Check oxygen levels during ambulation today to assess need for supplemental oxygen with activity.  Atherosclerosis Severe, affecting blood flow to legs. Patient considering aortic bypass surgery. -Advise patient on risks of surgery, including potential respiratory complications due to underlying lung disease.  Follow up in 3 months after follow up CT Chest scan  Dorn Chill, MD [facility] Office: [phone]   Current Outpatient Medications:    albuterol  (VENTOLIN  HFA) 108 (90 Base) MCG/ACT inhaler, Inhale 2 puffs into the lungs every 4 (four) hours as needed for wheezing or shortness of breath., Disp: 8 g, Rfl: 3   aspirin  EC 81 MG tablet, Take 81 mg by mouth daily. Swallow whole., Disp: , Rfl:    enalapril  (VASOTEC ) 20 MG tablet, TAKE 1 TABLET BY MOUTH TWICE DAILY, Disp: 180 tablet, Rfl: 3   Fluticasone -Umeclidin-Vilant (TRELEGY ELLIPTA ) 100-62.5-25 MCG/ACT AEPB, Inhale 1 puff into the lungs daily., Disp: 1 each, Rfl: 5   hydrOXYzine  (VISTARIL ) 25 MG capsule, Take 1 capsule (25 mg total) by mouth 4 (four) times daily as needed., Disp: 30 capsule, Rfl: 5   Polyethyl Glycol-Propyl Glycol 0.4-0.3 % SOLN, Place 1 drop into both eyes 3 (three) times daily as needed (dry eyes). , Disp: , Rfl:    albuterol  (PROVENTIL ) (2.5 MG/3ML) 0.083% nebulizer solution, Take 3 mLs (2.5 mg total) by nebulization every 6 (six) hours as needed for wheezing or shortness of breath. (Patient not taking: Reported on 10/05/2023), Disp: 75 mL, Rfl: 5   cetirizine (ZYRTEC) 10 MG tablet, Take 10 mg by mouth daily. (Patient not taking: Reported on 10/05/2023), Disp: , Rfl:    enalapril  (VASOTEC ) 10  MG tablet, Take 20 mg by mouth 2 (two) times daily. (Patient not taking: Reported on 10/05/2023), Disp: , Rfl:

## 2023-10-06 ENCOUNTER — Other Ambulatory Visit (HOSPITAL_COMMUNITY): Payer: Self-pay

## 2023-10-06 ENCOUNTER — Telehealth: Payer: Self-pay

## 2023-10-06 ENCOUNTER — Encounter: Payer: Self-pay | Admitting: Pulmonary Disease

## 2023-10-06 NOTE — Telephone Encounter (Signed)
 Pharmacy Patient Advocate Encounter  Insurance verification completed.   The patient is insured through [employer] claim for Trelegy. Currently a quantity of 60 is a 30 day supply and the co-pay is $302.00 .  *patient has a deductible to meet- per test claim after this has been met the copay should be $47.00  This test claim was processed through [facility]- copay amounts may vary at other pharmacies due to pharmacy/plan contracts, or as the patient moves through the different stages of their insurance plan.   ---------------------------------------------------------------  Pharmacy Patient Advocate Encounter  Insurance verification completed.   The patient is insured through [employer] claim for [employer]. Currently a quantity of 10.7g is a 30 day supply and the co-pay is $302.00 .  *patient has a deductible to meet- per test claim after this has been met the copay should be $47.00  This test claim was processed through [facility]- copay amounts may vary at other pharmacies due to pharmacy/plan contracts, or as the patient moves through the different stages of their insurance plan.

## 2023-10-08 ENCOUNTER — Telehealth (HOSPITAL_COMMUNITY): Payer: Self-pay

## 2023-10-08 ENCOUNTER — Encounter (HOSPITAL_COMMUNITY): Payer: Self-pay

## 2023-10-08 NOTE — Telephone Encounter (Signed)
 Received referral from Dr. Kara for this pt to participate in Pulmonary Rehab with the diagnosis of COPD 3. Clinical review of pt follow up appt on 10/05/23 Pulmonary office note. Pt appropriate for scheduling for Pulmonary rehab. Will forward to support staff for verification of insurance eligibility/benefits with pt consent.  LVM for patient to schedule Pulm Rehab orientation appointment   [facility]

## 2023-10-17 ENCOUNTER — Encounter: Payer: Self-pay | Admitting: Pulmonary Disease

## 2023-10-19 ENCOUNTER — Telehealth: Payer: Self-pay

## 2023-10-19 DIAGNOSIS — J4530 Mild persistent asthma, uncomplicated: Secondary | ICD-10-CM

## 2023-10-19 MED ORDER — ALBUTEROL SULFATE HFA 108 (90 BASE) MCG/ACT IN AERS: 2.0000 | INHALATION_SPRAY | RESPIRATORY_TRACT | 9 refills | Status: DC | PRN

## 2023-10-20 ENCOUNTER — Encounter: Payer: Self-pay | Admitting: Pulmonary Disease

## 2023-10-20 NOTE — Telephone Encounter (Signed)
 NFN

## 2023-10-26 ENCOUNTER — Telehealth (HOSPITAL_COMMUNITY): Payer: Self-pay

## 2023-10-26 DIAGNOSIS — J45909 Unspecified asthma, uncomplicated: Secondary | ICD-10-CM | POA: Diagnosis not present

## 2023-10-26 NOTE — Telephone Encounter (Signed)
 No response from in regards to Pulmonary Rehab   Closed referral

## 2023-10-27 ENCOUNTER — Telehealth: Payer: Self-pay | Admitting: Pulmonary Disease

## 2023-10-27 DIAGNOSIS — G4734 Idiopathic sleep related nonobstructive alveolar hypoventilation: Secondary | ICD-10-CM

## 2023-10-27 NOTE — Telephone Encounter (Signed)
 ONO Results  Patient spent 5 minutes 36 second with SpO2 less than 88%. Recommend she use 2L of supplemental oxygen with humidification at night when sleeping. Please place home O2 orders if patient ok with starting nocturnal oxygen therapy.  Thanks, JD

## 2023-10-29 NOTE — Telephone Encounter (Signed)
 I called and spoke with pt. Pt was informed of Dewald's note and okay the o2. I informed pt I would go ahead and send order, she verbalized understanding,. Nfn

## 2023-11-12 ENCOUNTER — Other Ambulatory Visit: Payer: Self-pay | Admitting: Adult Health

## 2023-11-22 ENCOUNTER — Other Ambulatory Visit: Payer: Self-pay | Admitting: Family Medicine

## 2023-12-22 DIAGNOSIS — G4730 Sleep apnea, unspecified: Secondary | ICD-10-CM | POA: Diagnosis not present

## 2023-12-22 DIAGNOSIS — E119 Type 2 diabetes mellitus without complications: Secondary | ICD-10-CM | POA: Diagnosis not present

## 2023-12-22 DIAGNOSIS — G4733 Obstructive sleep apnea (adult) (pediatric): Secondary | ICD-10-CM | POA: Diagnosis not present

## 2023-12-22 DIAGNOSIS — I739 Peripheral vascular disease, unspecified: Secondary | ICD-10-CM | POA: Diagnosis not present

## 2023-12-22 DIAGNOSIS — R600 Localized edema: Secondary | ICD-10-CM | POA: Diagnosis not present

## 2023-12-22 DIAGNOSIS — R918 Other nonspecific abnormal finding of lung field: Secondary | ICD-10-CM | POA: Diagnosis not present

## 2023-12-22 DIAGNOSIS — J4489 Other specified chronic obstructive pulmonary disease: Secondary | ICD-10-CM | POA: Diagnosis not present

## 2023-12-22 DIAGNOSIS — G4710 Hypersomnia, unspecified: Secondary | ICD-10-CM | POA: Diagnosis not present

## 2024-01-03 ENCOUNTER — Ambulatory Visit (HOSPITAL_COMMUNITY)
Admission: RE | Admit: 2024-01-03 | Discharge: 2024-01-03 | Disposition: A | Discharge status: Home / Self Care | Payer: Medicare Other | Source: Ambulatory Visit | Attending: Pulmonary Disease | Admitting: Pulmonary Disease

## 2024-01-03 DIAGNOSIS — R0602 Shortness of breath: Secondary | ICD-10-CM | POA: Diagnosis not present

## 2024-01-03 DIAGNOSIS — R911 Solitary pulmonary nodule: Secondary | ICD-10-CM | POA: Diagnosis not present

## 2024-01-03 DIAGNOSIS — J439 Emphysema, unspecified: Secondary | ICD-10-CM | POA: Diagnosis not present

## 2024-01-03 DIAGNOSIS — I700 Atherosclerosis of aorta: Secondary | ICD-10-CM | POA: Diagnosis not present

## 2024-01-19 DIAGNOSIS — I739 Peripheral vascular disease, unspecified: Secondary | ICD-10-CM | POA: Diagnosis not present

## 2024-01-25 ENCOUNTER — Ambulatory Visit: Admitting: Pulmonary Disease

## 2024-01-25 VITALS — BP 157/85 | HR 81 | Ht 68.5 in | Wt 323.4 lb

## 2024-01-25 DIAGNOSIS — J439 Emphysema, unspecified: Secondary | ICD-10-CM | POA: Diagnosis not present

## 2024-01-25 DIAGNOSIS — J4489 Other specified chronic obstructive pulmonary disease: Secondary | ICD-10-CM

## 2024-01-25 DIAGNOSIS — I739 Peripheral vascular disease, unspecified: Secondary | ICD-10-CM | POA: Diagnosis not present

## 2024-01-25 DIAGNOSIS — Z87891 Personal history of nicotine dependence: Secondary | ICD-10-CM | POA: Diagnosis not present

## 2024-01-25 DIAGNOSIS — M25551 Pain in right hip: Secondary | ICD-10-CM | POA: Diagnosis not present

## 2024-01-25 DIAGNOSIS — M25552 Pain in left hip: Secondary | ICD-10-CM | POA: Diagnosis not present

## 2024-01-25 DIAGNOSIS — R911 Solitary pulmonary nodule: Secondary | ICD-10-CM | POA: Diagnosis not present

## 2024-01-25 NOTE — Patient Instructions (Signed)
 The spot on your right lower lung is growing ever so slightly  We will follow up a CT Chest scan in 6 months or if you decide to move forward with a robotic navigational biopsy   Follow up in 6 months after CT Chest scan

## 2024-01-25 NOTE — Progress Notes (Signed)
 Synopsis: Referred in February 2024 for COPD with asthma by Remo Carls, MD  Subjective:   PATIENT ID: Kelli Horn GENDER: female DOB: 14-Sep-1955, MRN: [id number]  HPI  Chief Complaint  Patient presents with   Follow-up    CT Results Review   Kelli Horn is a 68 year old woman, former smoker with history of asthma-COPD overlap syndrome, sleep apnea intolerant to CPAP, history of breast cancer and endometrial cancer, severe peripheral vascular disease and diabetes who returns to pulmonary clinic for follow up.   Initial OV She experiences worsening shortness of breath, particularly with exertion, leading to a significant decrease in her activity level, approximately a 50% reduction. Breathing is manageable while sitting but becomes difficult with physical activity. Her severe atherosclerosis is being evaluated for an aortic bypass due to lack of blood flow to her legs, and her breathing issues are impacting her ability to walk, contributing to decreased mobility.  She is using trelegy daily.  She has mild sleep apnea and narcolepsy, with difficulty using CPAP therapy due to discomfort. She experiences poor sleep, often only sleeping for two hours at a time.   She has gained 20 pounds over the holidays, which she attributes to decreased mobility and worsening breathing issues. This weight gain has further impacted her ability to walk and breathe.  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 She is concerned about her ability to care for him due to her declining health.  OV 10/26/22 She was seen 10/16/22 by Roena Clark, NP and treated for exacerbation with prednisone  and antibiotics. She was started on prilosec 20mg  daily and pepcid at betime for cough due to reflux. She doesn't feel much better since that visit.  CT Chest 10/26/21 shows moderate centrilobular and paraseptal emphysematous changes, upper lung predominant and 15mm  irregular/subsolid nodule with an 8mm solid component of superior segment of RLL.   Absolute Eos 800 on 10/16/22  She reports being sick over the past 3 months which started with a respiratory viral illness. She has persistent dyspnea. She has palpitations and lower extremity edema. She denies wheezing or cough. She reports swelling of her breasts with thickening of the skin.   She has visit with Dr. [place] of Cardiology tomorrow.   OV 01/28/23 She is without acute respiratory symptoms and her breathing has been stable since last visit.   CT Chest scan 01/21/23 shows stable RLL mixed solid groundglass nodule compared to 10/2022.   OV 01/25/23 A subsolid nodule in the right lower lung was first identified two years ago during a CT scan for gastroenteritis. Recent imaging shows a slight increase in the solid component size, though the overall nodule size remains stable.  She experiences shortness of breath, which she attributes to her lung condition, and uses Trelegy, one puff daily. She reports no lung pain but feels restricted in her breathing. Her emphysema primarily affects the upper lungs.  Her mother had lung cancer that metastasized, contributing to her concern about the lung nodule.   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 Sleep study done Jun 20 2020   SOB  (shortness of breath)    Swallowing difficulty      Family History  Problem Relation Age of Onset   Lung cancer Mother    Schizophrenia Mother    Heart attack Father 53   Aneurysm Father    Diabetes Father    Hypertension Father    High Cholesterol Father    Stroke Father    Obesity Father    AAA (abdominal aortic aneurysm) Father    Breast cancer Maternal Aunt    Heart disease Paternal Uncle    Aneurysm Paternal Uncle    Diabetes Other    Colon cancer Neg Hx    Cancer - Ovarian Neg Hx    Endometrial cancer Neg Hx    Pancreatic cancer Neg Hx    Prostate cancer Neg Hx      Social History   Socioeconomic History   Marital status: Married    Spouse name: Not on file   Number of children: 2   Years of education: Not on file   Highest education level: Not on file  Occupational History   Occupation: retired , 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  Tobacco Use   Smoking status: Former    Current packs/day: 0.00    Average packs/day: 2.0 packs/day for 10.0 years (20.0 ttl pk-yrs)    Types: Cigarettes    Start date: 04/16/1994    Quit date: 04/16/2004    Years since quitting: 19.7   Smokeless tobacco: Never  Vaping Use   Vaping status: Never Used  Substance and Sexual Activity   Alcohol use: No   Drug use: No   Sexual activity: Not Currently  Other Topics Concern   Not on file  Social History Narrative   Not on file   Social Drivers of Health   Financial Resource Strain: Low Risk  (02/23/2023)   Overall Financial Resource Strain (CARDIA)    Difficulty of Paying Living Expenses: Not hard at all  Food Insecurity: Low Risk  (12/22/2023)   Received from [facility]   Hunger Vital Sign    Worried About Running Out of Food in the Last Year: Never true    Ran Out of Food in the Last Year: Never true  Transportation Needs: No Transportation Needs (12/22/2023)   Received from [facility]    In the past 12 months, has lack of reliable transportation kept you  from medical appointments, meetings, work or from getting things needed for daily living? : No  Physical Activity: Inactive (02/23/2023)   Exercise Vital Sign    Days of Exercise per Week: 0 days    Minutes of Exercise per Session: 0 min  Stress: No Stress Concern Present (02/23/2023)   [employer] of Occupational Health - Occupational Stress Questionnaire    Feeling of Stress : Only a little  Social Connections: Moderately Isolated (02/23/2023)   Social Connection and Isolation Panel [NHANES]    Frequency of Communication with Friends and Family: Three times a week  Frequency of Social Gatherings with Friends and Family: Three times a week    Attends Religious Services: Never    Active Member of Clubs or Organizations: No    Attends Banker Meetings: Never    Marital Status: Married  Catering manager Violence: Not At Risk (02/23/2023)   Humiliation, Afraid, Rape, and Kick questionnaire    Fear of Current or Ex-Partner: No    Emotionally Abused: No    Physically Abused: No    Sexually Abused: No     Allergies  Allergen Reactions   Beta Adrenergic Blockers Other (See Comments)    Trigger Asthma   Carvedilol  Shortness Of Breath    Reacts with albuterol , worsens asthma symptoms when taken together    Peanut-Containing Drug Products Other (See Comments)     Trigger Asthma attack   Crestor  [Rosuvastatin ]     myalgias   Iodinated Contrast Media Other (See Comments)    Pt states that she has never had any reaction what so ever to contrast.    Metformin  And Related Other (See Comments)    Epigastric pain, GI cramping.   Statins Other (See Comments)   Zocor [Simvastatin]     myalgias     Outpatient Medications Prior to Visit  Medication Sig Dispense Refill   albuterol  (VENTOLIN  HFA) 108 (90 Base) MCG/ACT inhaler Inhale 2 puffs into the lungs every 4 (four) hours as needed for wheezing or shortness of breath. 8 g 9   aspirin  EC 81 MG tablet Take 81 mg by mouth  daily. Swallow whole.     cetirizine (ZYRTEC) 10 MG tablet Take 10 mg by mouth daily.     enalapril  (VASOTEC ) 10 MG tablet Take 20 mg by mouth 2 (two) times daily.     enalapril  (VASOTEC ) 20 MG tablet TAKE 1 TABLET BY MOUTH TWICE DAILY 180 tablet 3   Fluticasone -Umeclidin-Vilant (TRELEGY ELLIPTA ) 100-62.5-25 MCG/ACT AEPB INHALE 1 PUFF INTO THE LUNGS DAILY 60 each 10   hydrOXYzine  (VISTARIL ) 25 MG capsule Take 1 capsule (25 mg total) by mouth 4 (four) times daily as needed. 30 capsule 5   Polyethyl Glycol-Propyl Glycol 0.4-0.3 % SOLN Place 1 drop into both eyes 3 (three) times daily as needed (dry eyes).      albuterol  (PROVENTIL ) (2.5 MG/3ML) 0.083% nebulizer solution Take 3 mLs (2.5 mg total) by nebulization every 6 (six) hours as needed for wheezing or shortness of breath. (Patient not taking: Reported on 10/05/2023) 75 mL 5   No facility-administered medications prior to visit.   Review of Systems  Constitutional:  Negative for chills, fever, malaise/fatigue and weight loss.  HENT:  Negative for congestion, sinus pain and sore throat.   Eyes: Negative.   Respiratory:  Negative for cough, hemoptysis, sputum production, shortness of breath and wheezing.   Cardiovascular:  Positive for leg swelling. Negative for chest pain, palpitations, orthopnea and claudication.  Gastrointestinal:  Negative for abdominal pain, heartburn, nausea and vomiting.  Genitourinary: Negative.   Musculoskeletal:  Negative for joint pain and myalgias.  Skin:  Negative for rash.  Neurological:  Negative for weakness.  Endo/Heme/Allergies: Negative.   Psychiatric/Behavioral: Negative.     Objective:   Vitals:   01/25/24 1420  BP: (!) 157/85  Pulse: 81  SpO2: 95%  Weight: (!) 323 lb 6.4 oz (146.7 kg)  Height: 5' 8.5" (1.74 m)   Physical Exam Constitutional:      General: She is not in acute distress.    Appearance: She is obese. She  is not ill-appearing.  HENT:     Head: Normocephalic and atraumatic.   Eyes:     General: No scleral icterus.    Conjunctiva/sclera: Conjunctivae normal.  Cardiovascular:     Rate and Rhythm: Normal rate and regular rhythm.     Pulses: Normal pulses.     Heart sounds: Normal heart sounds. No murmur heard. Pulmonary:     Effort: Pulmonary effort is normal.     Breath sounds: Normal breath sounds. No wheezing, rhonchi or rales.  Abdominal:     General: Bowel sounds are normal.     Palpations: Abdomen is soft.  Musculoskeletal:     Right lower leg: No edema.     Left lower leg: No edema.  Skin:    General: Skin is warm and dry.  Neurological:     General: No focal deficit present.     Mental Status: She is alert.    CBC    Component Value Date/Time   WBC 11.5 (H) 10/16/2022 0931   RBC 4.88 10/16/2022 0931   HGB 13.2 10/16/2022 0931   HGB 13.0 08/02/2020 1635   HCT 39.4 10/16/2022 0931   HCT 39.3 08/02/2020 1635   PLT 441.0 (H) 10/16/2022 0931   PLT 466 (H) 08/02/2020 1635   MCV 80.7 10/16/2022 0931   MCV 81 08/02/2020 1635   MCH 25.7 (L) 08/15/2022 2056   MCHC 33.5 10/16/2022 0931   RDW 15.7 (H) 10/16/2022 0931   RDW 12.4 08/02/2020 1635   LYMPHSABS 3.0 10/16/2022 0931   LYMPHSABS 2.7 04/19/2020 1144   MONOABS 1.1 (H) 10/16/2022 0931   EOSABS 0.8 (H) 10/16/2022 0931   EOSABS 0.4 04/19/2020 1144   BASOSABS 0.1 10/16/2022 0931   BASOSABS 0.1 04/19/2020 1144      Latest Ref Rng & Units 11/12/2022   11:15 AM 10/16/2022    9:27 AM 08/15/2022    8:56 PM  BMP  Glucose 70 - 99 mg/dL 409  811  914   BUN 6 - 23 mg/dL 13  17  11    Creatinine 0.40 - 1.20 mg/dL 7.82  9.56  2.13   Sodium 135 - 145 mEq/L 139  136  137   Potassium 3.5 - 5.1 mEq/L 4.0  4.3  4.3   Chloride 96 - 112 mEq/L 101  100  104   CO2 19 - 32 mEq/L 28  27  23    Calcium  8.4 - 10.5 mg/dL 9.1  08.6  9.2    Chest imaging: CT Chest 01/21/23 1. Persistent 15 mm sub solid right lower lobe pulmonary nodule, with 7 mm solid component. Given the persistence and size of the solid  component, this nodule is worrisome for adenocarcinoma. Tissue sampling is recommended. This recommendation follows the consensus statement: Guidelines for Management of Incidental Pulmonary Nodules Detected on CT Images: From the Fleischner Society 2017; Radiology 2017; 284:228-243. 2. Aortic Atherosclerosis (ICD10-I70.0) and Emphysema  CT Chest 10/23/22 15 mm irregular/subsolid nodule in the superior segment right lower lobe. While this may be infectious/inflammatory, neoplasm is not excluded.   Per Fleischner Society Guidelines, recommend a non-contrast Chest CT at 3-6 months to assess for persistence. If persistent, this appearance would raise concern for invasive adenocarcinoma.   Aortic Atherosclerosis (ICD10-I70.0) and Emphysema  PFT:    Latest Ref Rng & Units 04/24/2020    2:49 PM  PFT Results  FVC-Pre L 2.53   FVC-Predicted Pre % 63   FVC-Post L 2.47   FVC-Predicted Post % 61  Pre FEV1/FVC % % 50   Post FEV1/FCV % % 56   FEV1-Pre L 1.27   FEV1-Predicted Pre % 41   FEV1-Post L 1.37   DLCO uncorrected ml/min/mmHg 17.23   DLCO UNC% % 70   DLVA Predicted % 101     Labs:  Path:  Echo 2021: LV EF 55-60%. Mild concentric hypertrophy. RV function and size is normal. LA and RA are mildly elevated.   Heart Catheterization:       Assessment & Plan:   No diagnosis found.  Discussion: Kelli Horn is a 68 year old woman, former smoker with history of asthma-COPD overlap syndrome, sleep apnea intolerant to CPAP, history of breast cancer and endometrial cancer, severe peripheral vascular disease and diabetes who returns to pulmonary clinic for follow up.   Persistent subsolid pulmonary nodule 17 mm subsolid nodule in right lower lung with 8 mm solid component, slight growth over past year. Differential includes infection, inflammation, malignancy. Discussed biopsy risks and benefits. She prefers monitoring with follow-up CT in six months. Potential wedge resection  or radiation if malignancy confirmed, considering emphysema impact. - Schedule follow-up CT scan in six months. - Discuss biopsy and treatment options if significant growth.  Emphysema Chronic emphysema with upper lung involvement. No acute exacerbations. Improved insurance coverage for Trelegy inhaler. - Continue Trelegy inhaler, one puff daily.  Follow up in 6 months after follow up CT Chest scan  Duaine German, MD [facility] Office: [phone]   Current Outpatient Medications:    albuterol  (VENTOLIN  HFA) 108 (90 Base) MCG/ACT inhaler, Inhale 2 puffs into the lungs every 4 (four) hours as needed for wheezing or shortness of breath., Disp: 8 g, Rfl: 9   aspirin  EC 81 MG tablet, Take 81 mg by mouth daily. Swallow whole., Disp: , Rfl:    cetirizine (ZYRTEC) 10 MG tablet, Take 10 mg by mouth daily., Disp: , Rfl:    enalapril  (VASOTEC ) 10 MG tablet, Take 20 mg by mouth 2 (two) times daily., Disp: , Rfl:    enalapril  (VASOTEC ) 20 MG tablet, TAKE 1 TABLET BY MOUTH TWICE DAILY, Disp: 180 tablet, Rfl: 3   Fluticasone -Umeclidin-Vilant (TRELEGY ELLIPTA ) 100-62.5-25 MCG/ACT AEPB, INHALE 1 PUFF INTO THE LUNGS DAILY, Disp: 60 each, Rfl: 10   hydrOXYzine  (VISTARIL ) 25 MG capsule, Take 1 capsule (25 mg total) by mouth 4 (four) times daily as needed., Disp: 30 capsule, Rfl: 5   Polyethyl Glycol-Propyl Glycol 0.4-0.3 % SOLN, Place 1 drop into both eyes 3 (three) times daily as needed (dry eyes). , Disp: , Rfl:    albuterol  (PROVENTIL ) (2.5 MG/3ML) 0.083% nebulizer solution, Take 3 mLs (2.5 mg total) by nebulization every 6 (six) hours as needed for wheezing or shortness of breath. (Patient not taking: Reported on 10/05/2023), Disp: 75 mL, Rfl: 5

## 2024-02-02 ENCOUNTER — Encounter: Payer: Self-pay | Admitting: Pulmonary Disease

## 2024-02-03 DIAGNOSIS — E782 Mixed hyperlipidemia: Secondary | ICD-10-CM | POA: Diagnosis not present

## 2024-02-03 DIAGNOSIS — E119 Type 2 diabetes mellitus without complications: Secondary | ICD-10-CM | POA: Diagnosis not present

## 2024-02-03 DIAGNOSIS — E559 Vitamin D deficiency, unspecified: Secondary | ICD-10-CM | POA: Diagnosis not present

## 2024-02-16 DIAGNOSIS — M25552 Pain in left hip: Secondary | ICD-10-CM | POA: Diagnosis not present

## 2024-02-16 DIAGNOSIS — M25551 Pain in right hip: Secondary | ICD-10-CM | POA: Diagnosis not present

## 2024-02-16 DIAGNOSIS — M545 Low back pain, unspecified: Secondary | ICD-10-CM | POA: Diagnosis not present

## 2024-04-04 DIAGNOSIS — H2511 Age-related nuclear cataract, right eye: Secondary | ICD-10-CM | POA: Diagnosis not present

## 2024-04-04 DIAGNOSIS — H02831 Dermatochalasis of right upper eyelid: Secondary | ICD-10-CM | POA: Diagnosis not present

## 2024-04-04 DIAGNOSIS — E119 Type 2 diabetes mellitus without complications: Secondary | ICD-10-CM | POA: Diagnosis not present

## 2024-04-04 DIAGNOSIS — H02834 Dermatochalasis of left upper eyelid: Secondary | ICD-10-CM | POA: Diagnosis not present

## 2024-06-12 DIAGNOSIS — H43393 Other vitreous opacities, bilateral: Secondary | ICD-10-CM | POA: Diagnosis not present

## 2024-06-12 DIAGNOSIS — H2513 Age-related nuclear cataract, bilateral: Secondary | ICD-10-CM | POA: Diagnosis not present

## 2024-06-12 DIAGNOSIS — H52223 Regular astigmatism, bilateral: Secondary | ICD-10-CM | POA: Diagnosis not present

## 2024-06-12 DIAGNOSIS — H353131 Nonexudative age-related macular degeneration, bilateral, early dry stage: Secondary | ICD-10-CM | POA: Diagnosis not present

## 2024-06-12 DIAGNOSIS — H25043 Posterior subcapsular polar age-related cataract, bilateral: Secondary | ICD-10-CM | POA: Diagnosis not present

## 2024-06-12 DIAGNOSIS — E119 Type 2 diabetes mellitus without complications: Secondary | ICD-10-CM | POA: Diagnosis not present

## 2024-06-20 NOTE — Progress Notes (Signed)
 Enza Shone                                          MRN: [id number]   06/20/2024   The [facility]BCI Quality Team Specialist reviewed this patient medical record for the purposes of chart review for care gap closure. The following were reviewed: abstraction for care gap closure-glycemic status assessment.    VBCI Quality Team

## 2024-06-20 NOTE — Progress Notes (Signed)
 Kelli Horn                                          MRN: [id number]   06/20/2024   The VBCI Quality Team Specialist reviewed this patient medical record for the purposes of chart review for care gap closure. The following were reviewed: chart review for care gap closure-kidney health evaluation for diabetes:eGFR  and uACR.    VBCI Quality Team

## 2024-06-22 DIAGNOSIS — E782 Mixed hyperlipidemia: Secondary | ICD-10-CM | POA: Diagnosis not present

## 2024-06-22 DIAGNOSIS — E559 Vitamin D deficiency, unspecified: Secondary | ICD-10-CM | POA: Diagnosis not present

## 2024-06-22 DIAGNOSIS — E119 Type 2 diabetes mellitus without complications: Secondary | ICD-10-CM | POA: Diagnosis not present

## 2024-06-26 DIAGNOSIS — H5203 Hypermetropia, bilateral: Secondary | ICD-10-CM | POA: Diagnosis not present

## 2024-06-26 DIAGNOSIS — E119 Type 2 diabetes mellitus without complications: Secondary | ICD-10-CM | POA: Diagnosis not present

## 2024-06-26 DIAGNOSIS — H353131 Nonexudative age-related macular degeneration, bilateral, early dry stage: Secondary | ICD-10-CM | POA: Diagnosis not present

## 2024-06-26 DIAGNOSIS — H52203 Unspecified astigmatism, bilateral: Secondary | ICD-10-CM | POA: Diagnosis not present

## 2024-06-26 DIAGNOSIS — H2513 Age-related nuclear cataract, bilateral: Secondary | ICD-10-CM | POA: Diagnosis not present

## 2024-06-26 DIAGNOSIS — H25811 Combined forms of age-related cataract, right eye: Secondary | ICD-10-CM | POA: Diagnosis not present

## 2024-06-26 DIAGNOSIS — H25043 Posterior subcapsular polar age-related cataract, bilateral: Secondary | ICD-10-CM | POA: Diagnosis not present

## 2024-06-26 DIAGNOSIS — H524 Presbyopia: Secondary | ICD-10-CM | POA: Diagnosis not present

## 2024-07-14 ENCOUNTER — Ambulatory Visit (HOSPITAL_COMMUNITY)
Admission: RE | Admit: 2024-07-14 | Discharge: 2024-07-14 | Disposition: A | Discharge status: Home / Self Care | Source: Ambulatory Visit | Attending: Pulmonary Disease | Admitting: Pulmonary Disease

## 2024-07-14 DIAGNOSIS — R911 Solitary pulmonary nodule: Secondary | ICD-10-CM | POA: Diagnosis present

## 2024-08-02 ENCOUNTER — Encounter: Payer: Self-pay | Admitting: Pulmonary Disease

## 2024-08-02 ENCOUNTER — Ambulatory Visit: Admitting: Pulmonary Disease

## 2024-08-02 VITALS — BP 169/69 | HR 95 | Ht 70.0 in | Wt 335.0 lb

## 2024-08-02 DIAGNOSIS — J4489 Other specified chronic obstructive pulmonary disease: Secondary | ICD-10-CM | POA: Diagnosis not present

## 2024-08-02 DIAGNOSIS — R911 Solitary pulmonary nodule: Secondary | ICD-10-CM | POA: Diagnosis not present

## 2024-08-02 MED ORDER — ALBUTEROL SULFATE HFA 108 (90 BASE) MCG/ACT IN AERS: 2.0000 | INHALATION_SPRAY | RESPIRATORY_TRACT | 9 refills | Status: AC | PRN

## 2024-08-02 MED ORDER — TRELEGY ELLIPTA 100-62.5-25 MCG/ACT IN AEPB: 1.0000 | INHALATION_SPRAY | Freq: Every day | RESPIRATORY_TRACT | 10 refills | Status: AC

## 2024-08-02 MED ORDER — ALBUTEROL SULFATE (2.5 MG/3ML) 0.083% IN NEBU: 2.5000 mg | INHALATION_SOLUTION | Freq: Four times a day (QID) | RESPIRATORY_TRACT | 5 refills | Status: AC | PRN

## 2024-08-02 NOTE — Patient Instructions (Addendum)
 Schedule pulmonary function tests at the front desk   The spot on your right lower lung continues to grow  We will follow up a CT Chest scan in mid March  Continue trelegy ellipta  1 puff daily - rinse mouth out after each use  Use albuterol  inhaler or nebulizer treatment as needed every 4-6 hours as needed  Follow up in late March to review the CT Chest scan

## 2024-08-02 NOTE — Progress Notes (Signed)
 Established Patient Pulmonology Office Visit   Subjective:  Patient ID: Kelli Horn, female    DOB: Dec 04, 1955  MRN: [id number]  CC:  Chief Complaint  Patient presents with   Medical Management of Chronic Issues    Pt states Rx refills     Discussed the use of AI scribe software for clinical note transcription with the patient, who gave verbal consent to proceed.  History of Present Illness Kelli Horn is a 68 year old female with emphysema and a ground glass nodule who presents for follow up.  She describes progressive shortness of breath and chest tightness with minimal exertion such as walking from her house to her car. She has increased use of her albuterol  rescue inhaler compared with baseline on Trelegy, and prefers nebulized treatments but has run out of solution and has had difficulty obtaining a refill.  She has a known ground glass lung nodule that has become more solid and slightly larger over the past year. Her breathing worsens with exposure to smoke from neighbors' fireplaces. She has not used nocturnal oxygen because she has not received the equipment.  She uses Trelegy, albuterol  inhaler, and nebulizer solutions for her emphysema.  She has no cough or wheeze unless exposed to smoke but notes increased rescue inhaler use and exertional dyspnea.        Review of Systems  Constitutional:  Negative for chills, fever, malaise/fatigue and weight loss.  HENT:  Negative for congestion, sinus pain and sore throat.   Eyes: Negative.   Respiratory:  Positive for shortness of breath. Negative for cough, hemoptysis, sputum production and wheezing.   Cardiovascular:  Negative for chest pain, palpitations, orthopnea, claudication and leg swelling.  Gastrointestinal:  Negative for abdominal pain, heartburn, nausea and vomiting.  Genitourinary: Negative.   Musculoskeletal:  Negative for joint pain and myalgias.  Skin:  Negative for rash.  Neurological:   Negative for weakness.  Endo/Heme/Allergies: Negative.   Psychiatric/Behavioral: Negative.        Current Outpatient Medications:    aspirin  EC 81 MG tablet, Take 81 mg by mouth daily. Swallow whole., Disp: , Rfl:    cetirizine (ZYRTEC) 10 MG tablet, Take 10 mg by mouth daily., Disp: , Rfl:    enalapril  (VASOTEC ) 20 MG tablet, TAKE 1 TABLET BY MOUTH TWICE DAILY, Disp: 180 tablet, Rfl: 3   hydrOXYzine  (VISTARIL ) 25 MG capsule, Take 1 capsule (25 mg total) by mouth 4 (four) times daily as needed., Disp: 30 capsule, Rfl: 5   Polyethyl Glycol-Propyl Glycol 0.4-0.3 % SOLN, Place 1 drop into both eyes 3 (three) times daily as needed (dry eyes). , Disp: , Rfl:    albuterol  (PROVENTIL ) (2.5 MG/3ML) 0.083% nebulizer solution, Take 3 mLs (2.5 mg total) by nebulization every 6 (six) hours as needed for wheezing or shortness of breath., Disp: 75 mL, Rfl: 5   albuterol  (VENTOLIN  HFA) 108 (90 Base) MCG/ACT inhaler, Inhale 2 puffs into the lungs every 4 (four) hours as needed for wheezing or shortness of breath., Disp: 8 g, Rfl: 9   Fluticasone -Umeclidin-Vilant (TRELEGY ELLIPTA ) 100-62.5-25 MCG/ACT AEPB, Inhale 1 puff into the lungs daily., Disp: 60 each, Rfl: 10      Objective:  BP (!) 169/69   Pulse 95   Ht 5' 10 (1.778 m) Comment: per pt  Wt (!) 335 lb (152 kg)   LMP 12/09/2013   SpO2 96%   BMI 48.07 kg/m     Physical Exam Constitutional:      General: She is  not in acute distress.    Appearance: Normal appearance. She is obese.  Eyes:     General: No scleral icterus.    Conjunctiva/sclera: Conjunctivae normal.  Cardiovascular:     Rate and Rhythm: Normal rate and regular rhythm.  Pulmonary:     Breath sounds: No wheezing, rhonchi or rales.  Musculoskeletal:     Right lower leg: No edema.     Left lower leg: No edema.  Skin:    General: Skin is warm and dry.  Neurological:     General: No focal deficit present.      Diagnostic Review:  Last CBC Lab Results  Component Value  Date   WBC 11.5 (H) 10/16/2022   HGB 13.2 10/16/2022   HCT 39.4 10/16/2022   MCV 80.7 10/16/2022   MCH 25.7 (L) 08/15/2022   RDW 15.7 (H) 10/16/2022   PLT 441.0 (H) 10/16/2022   Last metabolic panel Lab Results  Component Value Date   GLUCOSE 116 (H) 11/12/2022   NA 139 11/12/2022   K 4.0 11/12/2022   CL 101 11/12/2022   CO2 28 11/12/2022   BUN 13 11/12/2022   CREATININE 1.00 11/12/2022   GFR 59.02 (L) 11/12/2022   CALCIUM  9.1 11/12/2022   PROT 8.4 (H) 08/15/2022   ALBUMIN 4.0 08/15/2022   LABGLOB 3.0 11/05/2020   AGRATIO 1.4 11/05/2020   BILITOT 0.7 08/15/2022   ALKPHOS 80 08/15/2022   AST 30 08/15/2022   ALT 30 08/15/2022   ANIONGAP 10 08/15/2022   CT Chest 07/14/24 Mediastinum/Nodes: Multistation mediastinal lymph nodes measuring up to 1 cm in short axis not enlarged by CT size criteria. Thyroid gland, trachea, and esophagus demonstrate no significant findings.   Lungs/Pleura: Subsolid nodule in right lower lobe measuring 2.1 x 1.9 cm with a solid component measuring 8.6 mm stable to prior however gradually enlarging to 2024 which measured 1.5 cm with solid component of 8 mm. Subsegmental atelectasis of right middle lobe. Moderate centrilobular emphysematous changes. Few scattered pulmonary micronodules for example right upper lobe 6/38). No new nodules. No significant pleural effusion, consolidation or honeycombing. Bibasilar atelectasis. Mild bronchial wall thickening.     Assessment & Plan:   Assessment & Plan Asthma-COPD overlap syndrome (HCC)  Orders:   Pulmonary Function Test; Future   albuterol  (PROVENTIL ) (2.5 MG/3ML) 0.083% nebulizer solution; Take 3 mLs (2.5 mg total) by nebulization every 6 (six) hours as needed for wheezing or shortness of breath.   albuterol  (VENTOLIN  HFA) 108 (90 Base) MCG/ACT inhaler; Inhale 2 puffs into the lungs every 4 (four) hours as needed for wheezing or shortness of breath.   Fluticasone -Umeclidin-Vilant (TRELEGY  ELLIPTA) 100-62.5-25 MCG/ACT AEPB; Inhale 1 puff into the lungs daily.   Pulse oximetry, overnight; Future  Lung nodule  Orders:   CT SUPER D CHEST WO CONTRAST; Future   Assessment and Plan Assessment & Plan RLL ground glass nodule, growing, under surveillance Nodule increasing in density and size, raising concern for malignancy. Differential includes inflammatory nodule versus malignancy. She is hesitant about PET scan due to claustrophobia and previous adverse reactions. Biopsy considered to determine nodule nature. Risks include bleeding and pneumothorax. - Ordered CT scan for mid-March to monitor nodule growth per patient's request - She does not wish to undergo PET CT imaging or biopsy at this time - Discussed risks and benefits of biopsy, including bleeding and pneumothorax.  Asthma-COPD Syndrome COPD with emphysema features contributing to respiratory symptoms. Increased dyspnea with exertion and environmental triggers. Current management includes Trelegy and  albuterol  inhaler. - Sent updated prescription for nebulizer solution to pharmacy. - Continue Trelegy and albuterol  inhaler as prescribed. - Check ONO on room air - schedule for updated PFTs      Return in about 16 weeks (around 11/22/2024) for f/u visit Dr. Kara.   Dorn KATHEE Kara, MD

## 2024-08-08 NOTE — Progress Notes (Signed)
 Kelli Horn                                          MRN: [id number]   08/08/2024   The [facility]BCI Quality Team Specialist reviewed this patient medical record for the purposes of chart review for care gap closure. The following were reviewed: chart review for care gap closure-kidney health evaluation for diabetes:eGFR  and uACR.    VBCI Quality Team

## 2024-08-14 ENCOUNTER — Other Ambulatory Visit: Payer: Self-pay | Admitting: Family Medicine

## 2024-08-14 DIAGNOSIS — I10 Essential (primary) hypertension: Secondary | ICD-10-CM

## 2024-08-31 ENCOUNTER — Ambulatory Visit: Payer: Self-pay | Admitting: Pulmonary Disease

## 2024-08-31 NOTE — Progress Notes (Signed)
 Patient's ONO showed SpO2 less 88% for 52 minutes. Recommend she use 2L O2 with humidification at night. Please place DME orders if patient ok with starting therapy.

## 2024-09-01 NOTE — Telephone Encounter (Signed)
 Spoke with patient Kelli Horn, she said [employer] already called her to set up arraignments to be delivered ? I'm not seeing a order placed expect for one back in February.     Advise patient to let us  know if it was for something else so we can place the order

## 2024-09-07 ENCOUNTER — Telehealth: Payer: Self-pay

## 2024-09-07 NOTE — Telephone Encounter (Signed)
 Faxed requested paperwork to [employer]

## 2024-09-25 NOTE — Progress Notes (Signed)
 Kelli Horn                                          MRN: [id number]   09/25/2024   The [facility] Quality Team Specialist reviewed this patient medical record for the purposes of chart review for care gap closure. The following were reviewed: chart review for care gap closure-glycemic status assessment.    [facility] Quality Team
